# Patient Record
Sex: Male | Born: 1950
Health system: Southern US, Community
[De-identification: ages and names within clinical notes are randomized; demographics above are authoritative.]

## PROBLEM LIST (undated history)

## (undated) DIAGNOSIS — E669 Obesity, unspecified: Secondary | ICD-10-CM

## (undated) DIAGNOSIS — E1022 Type 1 diabetes mellitus with diabetic chronic kidney disease: Secondary | ICD-10-CM

## (undated) DIAGNOSIS — I498 Other specified cardiac arrhythmias: Secondary | ICD-10-CM

## (undated) DIAGNOSIS — I499 Cardiac arrhythmia, unspecified: Secondary | ICD-10-CM

## (undated) DIAGNOSIS — I214 Non-ST elevation (NSTEMI) myocardial infarction: Secondary | ICD-10-CM

## (undated) DIAGNOSIS — E119 Type 2 diabetes mellitus without complications: Secondary | ICD-10-CM

## (undated) DIAGNOSIS — E785 Hyperlipidemia, unspecified: Secondary | ICD-10-CM

## (undated) DIAGNOSIS — I10 Essential (primary) hypertension: Secondary | ICD-10-CM

## (undated) DIAGNOSIS — I5022 Chronic systolic (congestive) heart failure: Secondary | ICD-10-CM

## (undated) DIAGNOSIS — J449 Chronic obstructive pulmonary disease, unspecified: Secondary | ICD-10-CM

## (undated) DIAGNOSIS — Z95810 Presence of automatic (implantable) cardiac defibrillator: Secondary | ICD-10-CM

## (undated) DIAGNOSIS — I2510 Atherosclerotic heart disease of native coronary artery without angina pectoris: Secondary | ICD-10-CM

## (undated) DIAGNOSIS — I483 Typical atrial flutter: Secondary | ICD-10-CM

## (undated) DIAGNOSIS — I509 Heart failure, unspecified: Secondary | ICD-10-CM

## (undated) DIAGNOSIS — Z79899 Other long term (current) drug therapy: Secondary | ICD-10-CM

## (undated) DIAGNOSIS — I110 Hypertensive heart disease with heart failure: Secondary | ICD-10-CM

## (undated) DIAGNOSIS — N183 Chronic kidney disease, stage 3 (moderate): Secondary | ICD-10-CM

## (undated) DIAGNOSIS — I472 Ventricular tachycardia: Secondary | ICD-10-CM

## (undated) DIAGNOSIS — R0600 Dyspnea, unspecified: Secondary | ICD-10-CM

## (undated) HISTORY — DX: Chronic kidney disease, stage 3 (moderate): N18.3

## (undated) HISTORY — PX: EYE SURGERY: SHX253

## (undated) HISTORY — DX: Cardiac arrhythmia, unspecified: I49.9

## (undated) HISTORY — DX: Obesity, unspecified: E66.9

## (undated) HISTORY — DX: Essential (primary) hypertension: I10

## (undated) HISTORY — DX: Hypertensive heart disease with heart failure: I11.0

## (undated) HISTORY — PX: ICD IMPLANT: EP1208

## (undated) HISTORY — DX: Presence of automatic (implantable) cardiac defibrillator: Z95.810

## (undated) HISTORY — DX: Ventricular tachycardia: I47.2

## (undated) HISTORY — DX: Type 2 diabetes mellitus without complications: E11.9

## (undated) HISTORY — DX: Dyspnea, unspecified: R06.00

## (undated) HISTORY — DX: Typical atrial flutter: I48.3

## (undated) HISTORY — DX: Other specified cardiac arrhythmias: I49.8

## (undated) HISTORY — DX: Type 1 diabetes mellitus with diabetic chronic kidney disease: E10.22

## (undated) HISTORY — DX: Hyperlipidemia, unspecified: E78.5

## (undated) HISTORY — DX: Other long term (current) drug therapy: Z79.899

## (undated) HISTORY — DX: Heart failure, unspecified: I50.9

## (undated) HISTORY — DX: Non-ST elevation (NSTEMI) myocardial infarction: I21.4

## (undated) HISTORY — DX: Chronic systolic (congestive) heart failure: I50.22

## (undated) HISTORY — DX: Chronic obstructive pulmonary disease, unspecified: J44.9

## (undated) HISTORY — PX: MOHS SURGERY: SUR867

## (undated) HISTORY — PX: NEPHRECTOMY: SHX65

## (undated) HISTORY — DX: Atherosclerotic heart disease of native coronary artery without angina pectoris: I25.10

## (undated) NOTE — *Deleted (*Deleted)
Physical Medicine and Rehabilitation Admission H&P     HPI: James [name], [name]. is a 36 year old right-handed male with history of CKD with creatinine 1.47, CAD/non-STEMI/CABG /ICD 2015 maintained on amiodarone as well as Plavix followed by cardiology services Dr. Dulce Sellar in [facility], chronic systolic congestive heart failure maintained on Entresto, CKD stage III as well as right nephrectomy 2019, COPD, diabetes mellitus, hypertension, hyperlipidemia.  Per chart review patient lives with spouse.  1 level home one-step to entry.  Used a Rollator prior to admission.  Wife does assist with some ADLs.  Presented 02/10/2020 with progressive low back pain radiating to lower extremities right greater than left.  MRI demonstrated L3-4 and L4-5 spinal listhesis facet arthropathy spinal stenosis with radiculopathy.  Patient underwent bilateral L3-4 and 4-5 laminotomy foraminotomy to decompress bilateral L3-4-5 nerve roots with posterior lumbar interbody fusion as well as transforaminal lumbar interbody fusion 02/10/2020 per Dr. Lovell Sheehan.  Lumbar corset when out of bed.  Monitoring of renal function latest creatinine 1.62 from a baseline of 1.47-1.50.  Acute blood loss anemia 9.8 and monitored.  Plan to resume Plavix 5 days postop.  Therapy evaluations completed and patient was admitted for a comprehensive rehab program.  Review of Systems  Constitutional: Negative for chills and fever.  HENT: Negative for hearing loss.   Eyes: Negative for blurred vision and double vision.  Respiratory: Positive for shortness of breath. Negative for cough.   Cardiovascular: Positive for palpitations and leg swelling. Negative for chest pain.  Gastrointestinal: Positive for constipation. Negative for heartburn, nausea and vomiting.  Genitourinary: Positive for urgency. Negative for dysuria and hematuria.  Musculoskeletal: Positive for back pain, joint pain and myalgias.  Skin: Negative for rash.  All other  systems reviewed and are negative.  Past Medical History:  Diagnosis Date  . Abnormal nuclear stress test 11/03/2017   Added automatically from request for surgery [id number]  . Atrial arrhythmia 02/15/2014  . Benign hypertension with chronic kidney disease, stage III (HCC) 04/11/2018  . CAD in native artery 08/27/2014  . Cardiac dysrhythmia 02/12/2014  . Chronic systolic congestive heart failure (HCC) 10/26/2017  . CKD stage 3 due to type 1 diabetes mellitus (HCC) 08/28/2014   Overview:  Cr 1.3 at discharge 08/21/14  . COPD GOLD II 10/17/2014   Followed in Pulmonary clinic/ [facility]/ [facility]  - PFTs   10/17/2014 FEV1  2.09 ( 53%) ratio 62 no sign better p B2 and dlco 63% and corrects to 85%  - 10/17/2014 p extensive coaching HFA effectiveness =  90%    > try stiolto    . Coronary artery disease involving native coronary artery of native heart with angina pectoris (HCC) 08/27/2014  . DM (diabetes mellitus) (HCC)   . Dyspnea 09/05/2014   Followed in Pulmonary clinic/ [facility]/ [facility] - 09/05/2014  Walked RA x 3 laps @ 185 ft each stopped due to end of study, nl pace, no desat   - pfts 10/17/14 dlco 63% on Amiodarone > may benefit from 6 month f/u but defer to Cards    . Essential hypertension 09/11/2014   Changed acei to ARB  09/05/14 due to pseudowheeze> improved 10/17/14    . Heart failure (HCC)   . Hyperlipidemia   . Hypertension   . Hypertensive heart disease with congestive heart failure (HCC) 08/27/2014  . ICD (implantable cardioverter-defibrillator) in place 08/27/2014  . Metabolic bone disease 01/17/2018  . NSTEMI (non-ST elevated myocardial infarction) (HCC) 02/12/2014  . Obesity 10/18/2014  Complicated by dm/ hbp and low erv on pfts 10/17/14    . On amiodarone therapy 08/27/2014  . Renal oncocytoma of right kidney 01/29/2018  . S/P ICD (internal cardiac defibrillator) procedure 02/15/2014  . Systolic CHF, chronic (HCC) 08/27/2014   Overview:  BNP 1350 at discharge K 3.9 on 08/21/14 Echo  EF 35% May 2016  . Typical atrial flutter (HCC) 08/27/2014  . Ventricular tachyarrhythmia (HCC) 08/27/2014  . Vitamin D deficiency 01/17/2018   Past Surgical History:  Procedure Laterality Date  . CORONARY ARTERY BYPASS GRAFT  2008  . EYE SURGERY    . ICD IMPLANT    . MOHS SURGERY     Right Arm  . NEPHRECTOMY Right    Family History  Problem Relation Age of Onset  . Heart disease Mother   . Breast cancer Mother   . Heart disease Father   . Cancer Daughter   . COPD Brother    Social History:  reports that he quit smoking about 13 years ago. His smoking use included cigarettes. He has a 38.00 pack-year smoking history. He has never used smokeless tobacco. He reports that he does not drink alcohol and does not use drugs. Allergies:  Allergies  Allergen Reactions  . Baclofen Other (See Comments)    Mental status changes  . Codeine Rash   Medications Prior to Admission  Medication Sig Dispense Refill  . acetaminophen (TYLENOL) 500 MG tablet Take 500 mg by mouth 2 (two) times daily.     Marland Kitchen amiodarone (PACERONE) 200 MG tablet Take 200 mg by mouth 2 (two) times daily.    . Ascorbic Acid (VITAMIN C) 1000 MG tablet Take 1,000 mg by mouth 2 (two) times daily.    Marland Kitchen atorvastatin (LIPITOR) 40 MG tablet TAKE 1 TABLET BY MOUTH ONCE DAILY. *NEEDS APPOINTMENT FOR REFILLS* (Patient taking differently: Take 40 mg by mouth daily. ) 90 tablet 2  . b complex vitamins capsule Take 1 capsule by mouth daily.    . clopidogrel (PLAVIX) 75 MG tablet Take 1 tablet (75 mg total) by mouth daily. 90 tablet 1  . Coenzyme Q10 (COQ10) 100 MG CAPS Take 100 mg by mouth daily.    . ergocalciferol (VITAMIN D2) 1.25 MG (50000 UT) capsule Take 50,000 Units by mouth every Monday.     . furosemide (LASIX) 40 MG tablet TAKE 1 TABLET BY MOUTH TWICE(2) DAILY (Patient taking differently: Take 40 mg by mouth 2 (two) times daily. ) 180 tablet 1  . insulin detemir (LEVEMIR) 100 UNIT/ML injection Inject 10 Units into the skin  daily.     . isosorbide mononitrate (IMDUR) 30 MG 24 hr tablet Take 1 tablet by mouth every morning. (Hold if blood pressure under 100 sbp)    . levothyroxine (SYNTHROID, LEVOTHROID) 88 MCG tablet Take 88 mcg by mouth daily before breakfast.     . LORazepam (ATIVAN) 0.5 MG tablet Take 0.5 mg by mouth See admin instructions. Take 0.5 mg at bedtime may take a second 0.5 mg during the day as needed for anxiety    . metoprolol succinate (TOPROL XL) 25 MG 24 hr tablet Take 1 tablet (25 mg total) by mouth daily. 90 tablet 3  . Multiple Vitamin (MULTIVITAMIN) capsule Take 1 capsule by mouth daily.    Marland Kitchen NYAMYC powder Apply 1 application topically daily as needed for irritation.    Marland Kitchen omega-3 acid ethyl esters (LOVAZA) 1 g capsule Take 2 capsules (2 g total) by mouth in the morning and at  bedtime. (Patient taking differently: Take 1 g by mouth in the morning and at bedtime. ) 120 capsule 3  . sacubitril-valsartan (ENTRESTO) 49-51 MG Take 1 tablet by mouth 2 (two) times daily. 60 tablet 3  . senna (SENOKOT) 8.6 MG tablet Take 2 tablets by mouth daily.     . sitaGLIPtin (JANUVIA) 25 MG tablet Take 25 mg by mouth daily.     . tamsulosin (FLOMAX) 0.4 MG CAPS capsule Take 0.4 mg by mouth daily.     . Zinc 50 MG TABS Take 50 mg by mouth daily.     . magnesium hydroxide (MILK OF MAGNESIA) 400 MG/5ML suspension Take 15 mLs by mouth daily as needed for mild constipation.    . melatonin 3 MG TABS tablet Take 6 mg by mouth at bedtime.     . nitroGLYCERIN (NITROSTAT) 0.4 MG SL tablet Place 1 tablet (0.4 mg total) under the tongue every 5 (five) minutes as needed for chest pain. 25 tablet 5    Drug Regimen Review Drug regimen was reviewed and remains appropriate with no significant issues identified  Home: Home Living Family/patient expects to be discharged to:: Private residence Living Arrangements: Spouse/significant other Available Help at Discharge: Family, Available 24 hours/day Type of Home: House Home  Access: Stairs to enter Secretary/administrator of Steps: 1 Entrance Stairs-Rails: None Home Layout: One level, Laundry or work area in basement [employer] Shower/Tub: Engineer, manufacturing systems: Standard (3 in 1 over toilet) Home Equipment: Environmental consultant - 4 wheels, Toilet riser, Environmental consultant - 2 wheels   Functional History: Prior Function Level of Independence: Needs assistance Gait / Transfers Assistance Needed: use of rollator, wife states needs assistance at times to get out of chair without arms ADL's / Homemaking Assistance Needed: wife has been assisting with bathing, dressing, tub transfers due to increased back pain Comments: patient has been declining recently due to pain, unable to stand upright prior to surgery.   Functional Status:  Mobility: Bed Mobility Overal bed mobility: Needs Assistance Bed Mobility: Sit to Sidelying, Rolling Rolling: Min guard Supine to sit: Max assist, +2 for physical assistance, +2 for safety/equipment, HOB elevated Sit to supine: Max assist Sit to sidelying: Mod assist, +2 for physical assistance General bed mobility comments: Cues for log roll technique. Assist required for LEs and trunk descent.  Transfers Overall transfer level: Needs assistance Equipment used: Rolling walker (2 wheeled) Transfers: Sit to/from Stand, [employer] Transfers Sit to Stand: Mod assist, +2 physical assistance Stand pivot transfers: +2 safety/equipment, Min assist General transfer comment: Mod A +2 to stand from lower surface. Pt with improved steadiness, only requiring min A +2 for steadying to transfer back to bed. Required multimodal cues for sequencing.  Ambulation/Gait Ambulation/Gait assistance: Mod assist Gait Distance (Feet): 1 Feet Assistive device: Rolling walker (2 wheeled) Gait Pattern/deviations: Step-to pattern General Gait Details: 2 small steps fwd, patient unable to take steps backwards and began to sit on bed with uncontrolled descent    ADL: ADL  Overall ADL's : Needs assistance/impaired Eating/Feeding: Set up, Sitting Grooming: Set up, Sitting Upper Body Bathing: Minimal assistance, Sitting Lower Body Bathing: Sit to/from stand, Maximal assistance Upper Body Dressing : Minimal assistance, Sitting Lower Body Dressing: Total assistance, Sit to/from stand Lower Body Dressing Details (indicate cue type and reason): Total A to don socks. With increased time/effort, able to cross R LE but difficulty in doing so Toilet Transfer: Maximal assistance, +2 for physical assistance, +2 for safety/equipment, Stand-pivot, RW Toilet Transfer Details (indicate cue  type and reason): simaulted to recliner Toileting- Clothing Manipulation and Hygiene: Total assistance, Sit to/from stand General ADL Comments: Limited by decreased B LE strength, poor standing balance and back precautions but improved pain in comparison to pre-surgery   Cognition: Cognition Overall Cognitive Status: Impaired/Different from baseline Orientation Level: Oriented X4 Cognition Arousal/Alertness: Awake/alert Behavior During Therapy: Flat affect Overall Cognitive Status: Impaired/Different from baseline Area of Impairment: Safety/judgement, Problem solving, Awareness Memory: Decreased short-term memory Safety/Judgement: Decreased awareness of safety, Decreased awareness of deficits Awareness: Emergent Problem Solving: Slow processing, Decreased initiation, Difficulty sequencing, Requires verbal cues, Requires tactile cues General Comments: Pt with increased alertness, though still lethargic/flat appearing. Pt able to recall BLT precautions, requires repetition of sequencing and safety cues for effective follow-through  Physical Exam: Blood pressure 128/62, pulse 66, temperature 98.7 F (37.1 C), temperature source Oral, resp. rate 18, SpO2 95 %. Physical Exam Skin:    Comments: Back incision is dressed  Neurological:     Comments: Patient is alert in no acute distress.   Oriented x3 and follows commands.  Fair medical historian     Results for orders placed or performed during the hospital encounter of 02/10/20 (from the past 48 hour(s))  ABO/Rh     Status: None   Collection Time: 02/10/20  7:12 AM  Result Value Ref Range   ABO/RH(D)      A POS Performed at [facility], [address], [place], [place] [address]   Glucose, capillary     Status: Abnormal   Collection Time: 02/10/20  1:12 PM  Result Value Ref Range   Glucose-Capillary 143 (H) 70 - 99 mg/dL    Comment: Glucose reference range applies only to samples taken after fasting for at least 8 hours.   Comment 1 Notify RN    Comment 2 Document in Chart   Glucose, capillary     Status: Abnormal   Collection Time: 02/10/20  5:32 PM  Result Value Ref Range   Glucose-Capillary 201 (H) 70 - 99 mg/dL    Comment: Glucose reference range applies only to samples taken after fasting for at least 8 hours.   Comment 1 Notify RN    Comment 2 Document in Chart   Glucose, capillary     Status: Abnormal   Collection Time: 02/10/20  9:27 PM  Result Value Ref Range   Glucose-Capillary 203 (H) 70 - 99 mg/dL    Comment: Glucose reference range applies only to samples taken after fasting for at least 8 hours.   Comment 1 Notify RN    Comment 2 Document in Chart   CBC     Status: Abnormal   Collection Time: 02/11/20  1:35 AM  Result Value Ref Range   WBC 11.1 (H) 4.0 - 10.5 K/uL   RBC 3.85 (L) 4.22 - 5.81 MIL/uL   Hemoglobin 11.7 (L) 13.0 - 17.0 g/dL   HCT 88.4 (L) 39 - 52 %   MCV 93.5 80.0 - 100.0 fL   MCH 30.4 26.0 - 34.0 pg   MCHC 32.5 30.0 - 36.0 g/dL   RDW 16.6 06.3 - 01.6 %   Platelets 192 150 - 400 K/uL   nRBC 0.0 0.0 - 0.2 %    Comment: Performed at [facility], [address] [place], [place] [address]  Basic Metabolic Panel     Status: Abnormal   Collection Time: 02/11/20  1:35 AM  Result Value Ref Range   Sodium 140 135 - 145 mmol/L  Potassium 3.9 3.5 - 5.1 mmol/L    Chloride 104 98 - 111 mmol/L   CO2 25 22 - 32 mmol/L   Glucose, Bld 162 (H) 70 - 99 mg/dL    Comment: Glucose reference range applies only to samples taken after fasting for at least 8 hours.   BUN 32 (H) 8 - 23 mg/dL   Creatinine, Ser 4.69 (H) 0.61 - 1.24 mg/dL   Calcium 8.9 8.9 - 62.9 mg/dL   GFR, Estimated 55 (L) >60 mL/min    Comment: (NOTE) Calculated using the CKD-EPI Creatinine Equation (2021)    Anion gap 11 5 - 15    Comment: Performed at [facility], [address] [place], [place] [address]  Glucose, capillary     Status: Abnormal   Collection Time: 02/11/20  8:06 AM  Result Value Ref Range   Glucose-Capillary 152 (H) 70 - 99 mg/dL    Comment: Glucose reference range applies only to samples taken after fasting for at least 8 hours.  Glucose, capillary     Status: Abnormal   Collection Time: 02/11/20 12:11 PM  Result Value Ref Range   Glucose-Capillary 116 (H) 70 - 99 mg/dL    Comment: Glucose reference range applies only to samples taken after fasting for at least 8 hours.  Glucose, capillary     Status: Abnormal   Collection Time: 02/11/20  4:21 PM  Result Value Ref Range   Glucose-Capillary 125 (H) 70 - 99 mg/dL    Comment: Glucose reference range applies only to samples taken after fasting for at least 8 hours.  Glucose, capillary     Status: Abnormal   Collection Time: 02/11/20  8:06 PM  Result Value Ref Range   Glucose-Capillary 105 (H) 70 - 99 mg/dL    Comment: Glucose reference range applies only to samples taken after fasting for at least 8 hours.  Glucose, capillary     Status: Abnormal   Collection Time: 02/12/20  6:45 AM  Result Value Ref Range   Glucose-Capillary 114 (H) 70 - 99 mg/dL    Comment: Glucose reference range applies only to samples taken after fasting for at least 8 hours.   DG Lumbar Spine 2-3 Views  Result Date: 02/10/2020 CLINICAL DATA:  Posterior fusion EXAM: LUMBAR SPINE - 2-3 VIEW; DG C-ARM 1-60 MIN COMPARISON:  January 07, 2020  FLUOROSCOPY TIME:  0 minutes 22 seconds; 19.85 mGy; 2 acquired images FINDINGS: Frontal and lateral views were obtained. There are pedicle screws at L3, L4, and L5 bilaterally with screw tips in respective vertebral bodies. Disc spacers noted at L3-4 and L4-5. No fracture evident. Mild spondylolisthesis at L4-5 appear stable. No new spondylolisthesis. IMPRESSION: Postoperative pedicle screw placement at L3, L4, and L5 as well as disc spacers at L3-4 and L4-5. Pedicle screws in respective vertebral bodies. Stable mild spondylolisthesis at L4-5 with screw placement in this area. No fracture evident. Electronically Signed   By: Bretta Bang III M.D.   On: 02/10/2020 12:33   DG Lumbar Spine 1 View  Result Date: 02/10/2020 CLINICAL DATA:  Intraoperative localization image EXAM: LUMBAR SPINE - 1 VIEW COMPARISON:  01/07/2020, 12/13/2019 FINDINGS: Lateral radiograph of the lumbar spine was obtained with surgical retractors and instruments at the L3-4 interspace. Numbering nomenclature is similar to that utilized on prior MRI examination. IMPRESSION: Intraoperative localization at L3-4. Electronically Signed   By: Alcide Clever M.D.   On: 02/10/2020 14:10   DG C-Arm 1-60 Min  Result Date: 02/10/2020  CLINICAL DATA:  Posterior fusion EXAM: LUMBAR SPINE - 2-3 VIEW; DG C-ARM 1-60 MIN COMPARISON:  January 07, 2020 FLUOROSCOPY TIME:  0 minutes 22 seconds; 19.85 mGy; 2 acquired images FINDINGS: Frontal and lateral views were obtained. There are pedicle screws at L3, L4, and L5 bilaterally with screw tips in respective vertebral bodies. Disc spacers noted at L3-4 and L4-5. No fracture evident. Mild spondylolisthesis at L4-5 appear stable. No new spondylolisthesis. IMPRESSION: Postoperative pedicle screw placement at L3, L4, and L5 as well as disc spacers at L3-4 and L4-5. Pedicle screws in respective vertebral bodies. Stable mild spondylolisthesis at L4-5 with screw placement in this area. No fracture evident.  Electronically Signed   By: Bretta Bang III M.D.   On: 02/10/2020 12:33       Medical Problem List and Plan: 1.  Decreased functional mobility secondary to L3-4 4-5 spondylolisthesis arthropathy with radiculopathy/spinal stenosis.  Status post bilateral L3-4 4-5 laminotomy foraminotomies decompression interbody fusion 02/10/2020.  Back corset when out of bed  -patient may *** shower  -ELOS/Goals: *** 2.  Antithrombotics: -DVT/anticoagulation: SCDs.  Check vascular study  -antiplatelet therapy: Resume Plavix 75 mg daily postop day #5 3. Pain Management: Flexeril as needed, Dilaudid 2-4 mg every 4 hours as needed pain 4. Mood: Provide emotional support  -antipsychotic agents: N/A 5. Neuropsych: This patient is capable of making decisions on his own behalf. 6. Skin/Wound Care: Routine skin checks 7. Fluids/Electrolytes/Nutrition: Routine in and outs with with follow-up chemistries 8.  Acute on chronic anemia.  Follow-up CBC 9.  CAD with CABG/ICD.  Amiodarone 200 mg twice daily, Imdur 30 mg daily, Toprol 25 mg daily 10  COPD.  Check oxygen saturations every shift 11.  CKD stage III with history of right nephrectomy.  Baseline creatinine 1.47-1.50 12.  Diabetes mellitus.  Hemoglobin A1c 5.9.  Currently on Tradjenta 5 mg daily.  Patient on Levemir 10 units daily prior to admission.  Resume as needed 13.  Chronic systolic congestive heart failure.  Entresto 49-51 mg twice daily, Lasix 40 mg twice daily.  Monitor for any signs of fluid overload. 14.  Hypertension.  Monitor with increased mobility 15.  Hyperlipidemia.  Lipitor 16.  Hypothyroidism.  Synthroid 17.  BPH.  Flomax 0.4 mg daily.  Check PVR  ***  Charlton Amor, PA-C 02/12/2020

---

## 2006-04-04 HISTORY — PX: CORONARY ARTERY BYPASS GRAFT: SHX141

## 2007-03-14 ENCOUNTER — Ambulatory Visit: Payer: Self-pay | Admitting: Thoracic Surgery (Cardiothoracic Vascular Surgery)

## 2014-02-12 DIAGNOSIS — I214 Non-ST elevation (NSTEMI) myocardial infarction: Secondary | ICD-10-CM

## 2014-02-12 DIAGNOSIS — I499 Cardiac arrhythmia, unspecified: Secondary | ICD-10-CM | POA: Insufficient documentation

## 2014-02-12 HISTORY — DX: Cardiac arrhythmia, unspecified: I49.9

## 2014-02-12 HISTORY — DX: Non-ST elevation (NSTEMI) myocardial infarction: I21.4

## 2014-02-15 DIAGNOSIS — I498 Other specified cardiac arrhythmias: Secondary | ICD-10-CM

## 2014-02-15 DIAGNOSIS — Z95810 Presence of automatic (implantable) cardiac defibrillator: Secondary | ICD-10-CM

## 2014-02-15 HISTORY — DX: Presence of automatic (implantable) cardiac defibrillator: Z95.810

## 2014-02-15 HISTORY — DX: Other specified cardiac arrhythmias: I49.8

## 2014-08-27 DIAGNOSIS — Z79899 Other long term (current) drug therapy: Secondary | ICD-10-CM

## 2014-08-27 DIAGNOSIS — E785 Hyperlipidemia, unspecified: Secondary | ICD-10-CM | POA: Insufficient documentation

## 2014-08-27 DIAGNOSIS — I483 Typical atrial flutter: Secondary | ICD-10-CM | POA: Insufficient documentation

## 2014-08-27 DIAGNOSIS — I25119 Atherosclerotic heart disease of native coronary artery with unspecified angina pectoris: Secondary | ICD-10-CM | POA: Insufficient documentation

## 2014-08-27 DIAGNOSIS — I110 Hypertensive heart disease with heart failure: Secondary | ICD-10-CM | POA: Insufficient documentation

## 2014-08-27 DIAGNOSIS — I472 Ventricular tachycardia, unspecified: Secondary | ICD-10-CM

## 2014-08-27 DIAGNOSIS — I5022 Chronic systolic (congestive) heart failure: Secondary | ICD-10-CM | POA: Insufficient documentation

## 2014-08-27 DIAGNOSIS — I2510 Atherosclerotic heart disease of native coronary artery without angina pectoris: Secondary | ICD-10-CM

## 2014-08-27 DIAGNOSIS — Z95810 Presence of automatic (implantable) cardiac defibrillator: Secondary | ICD-10-CM

## 2014-08-27 HISTORY — DX: Ventricular tachycardia: I47.2

## 2014-08-27 HISTORY — DX: Presence of automatic (implantable) cardiac defibrillator: Z95.810

## 2014-08-27 HISTORY — DX: Hypertensive heart disease with heart failure: I11.0

## 2014-08-27 HISTORY — DX: Ventricular tachycardia, unspecified: I47.20

## 2014-08-27 HISTORY — DX: Chronic systolic (congestive) heart failure: I50.22

## 2014-08-27 HISTORY — DX: Typical atrial flutter: I48.3

## 2014-08-27 HISTORY — DX: Atherosclerotic heart disease of native coronary artery without angina pectoris: I25.10

## 2014-08-27 HISTORY — DX: Other long term (current) drug therapy: Z79.899

## 2014-08-27 HISTORY — DX: Atherosclerotic heart disease of native coronary artery with unspecified angina pectoris: I25.119

## 2014-08-28 DIAGNOSIS — E1022 Type 1 diabetes mellitus with diabetic chronic kidney disease: Secondary | ICD-10-CM

## 2014-08-28 DIAGNOSIS — N183 Chronic kidney disease, stage 3 unspecified: Secondary | ICD-10-CM | POA: Insufficient documentation

## 2014-08-28 HISTORY — DX: Chronic kidney disease, stage 3 unspecified: N18.30

## 2014-08-28 HISTORY — DX: Type 1 diabetes mellitus with diabetic chronic kidney disease: E10.22

## 2014-09-05 ENCOUNTER — Ambulatory Visit (INDEPENDENT_AMBULATORY_CARE_PROVIDER_SITE_OTHER): Payer: BLUE CROSS/BLUE SHIELD | Admitting: Internal Medicine

## 2014-09-05 ENCOUNTER — Ambulatory Visit (INDEPENDENT_AMBULATORY_CARE_PROVIDER_SITE_OTHER)
Admission: RE | Admit: 2014-09-05 | Discharge: 2014-09-05 | Disposition: A | Discharge status: Home / Self Care | Payer: BLUE CROSS/BLUE SHIELD | Source: Ambulatory Visit | Attending: Internal Medicine | Admitting: Internal Medicine

## 2014-09-05 ENCOUNTER — Encounter: Payer: Self-pay | Admitting: Internal Medicine

## 2014-09-05 ENCOUNTER — Encounter (INDEPENDENT_AMBULATORY_CARE_PROVIDER_SITE_OTHER): Payer: Self-pay

## 2014-09-05 VITALS — BP 122/66 | HR 73 | Wt 256.0 lb

## 2014-09-05 DIAGNOSIS — R0600 Dyspnea, unspecified: Secondary | ICD-10-CM

## 2014-09-05 DIAGNOSIS — I10 Essential (primary) hypertension: Secondary | ICD-10-CM | POA: Diagnosis not present

## 2014-09-05 HISTORY — DX: Dyspnea, unspecified: R06.00

## 2014-09-05 MED ORDER — VALSARTAN 80 MG PO TABS: 80.0000 mg | ORAL_TABLET | Freq: Every day | ORAL | Status: DC

## 2014-09-05 NOTE — Progress Notes (Signed)
Subjective:    Patient ID: James [name], male    DOB: 08/01/1950,    MRN: [id number]  HPI  83 yowm quit smoking 2008 with no respiratory problems other than ? osa at wt 240 and doing fine [facility] [employer]  Then acutely ill 08/14/14 head cold (? From wife) then sore throat then sob 5/14 night > UC  5/15 > admitted thru May 19th with dx of CAP with  resp failure/ hypercarbia sent home on 02  And referred to pulmonary clinic 09/05/14 by Dr Laqueta Due.   09/05/2014 1st [facility] office visit/ Junnie Loschiavo   Chief Complaint  Patient presents with  . Pulmonary Consult    Referred by Dr. Jillyn Ledger. Pt states that he had PNA mid May 2016 and had to be admitted to [facility]. Breathing is now back to his normal baseline.    breathing 80% better at discharge and even  some better since then, now walking treadmill x 15 min but slower pace same grade Was on bipap at [place] x 3 nights, felt better but d/c off it and just on 02 2lpm bedtime and prn daytime at first ov  Denies excess daytime somnolence/ on ACEi and tapering prednisone but no inhalers.  No obvious  day to day or daytime variabilty or assoc chronic cough or cp or chest tightness, subjective wheeze overt sinus or hb symptoms. No unusual exp hx or h/o childhood pna/ asthma or knowledge of premature birth.  Sleeping ok without nocturnal  or early am exacerbation  of respiratory  c/o's or need for noct saba. Also denies any obvious fluctuation of symptoms with weather or environmental changes or other aggravating or alleviating factors except as outlined above   Current Medications, Allergies, Complete Past Medical History, Past Surgical History, Family History, and Social History were reviewed in [employer] record.                Review of Systems  Constitutional: Negative for fever, chills, activity change, appetite change and unexpected weight change.  HENT: Negative for  congestion, dental problem, postnasal drip, rhinorrhea, sneezing, sore throat, trouble swallowing and voice change.   Eyes: Negative for visual disturbance.  Respiratory: Negative for cough, choking and shortness of breath.   Cardiovascular: Negative for chest pain and leg swelling.  Gastrointestinal: Negative for nausea, vomiting and abdominal pain.  Genitourinary: Negative for difficulty urinating.  Musculoskeletal: Negative for arthralgias.  Skin: Negative for rash.  Psychiatric/Behavioral: Negative for behavioral problems and confusion.       Objective:   Physical Exam  amb wm marked central obesity and mild pseudowheeze   Wt Readings from Last 3 Encounters:  No data found for Wt    Vital signs reviewed    HEENT: nl dentition, turbinates, and orophanx. Nl external ear canals without cough reflex   NECK :  without JVD/Nodes/TM/ nl carotid upstrokes bilaterally   LUNGS: no acc muscle use, clear to A and P bilaterally without cough on insp or exp maneuvers   CV:  RRR  no s3 or murmur or increase in P2, no edema   ABD:  Massively obese but soft and nontender with nl excursion in the supine position. No bruits or organomegaly, bowel sounds nl  MS:  warm without deformities, calf tenderness, cyanosis or clubbing  SKIN: warm and dry without lesions    NEURO:  alert, approp, no deficits   CXR PA and Lateral:   09/05/2014 :  I personally reviewed images and agree with radiology impression as follows:   No residual infiltrate. Minimal posterior atelectasis.            Assessment & Plan:

## 2014-09-05 NOTE — Patient Instructions (Addendum)
Diovan 80 mg one daily instead of lisinopril  Just use the duoneb as needed up to every 6 hours  Wear 02 at 2lpm at bedtime and as needed to keep sats above 90% daytime   Ok to return to work  GERD (REFLUX)  is an extremely common cause of respiratory symptoms just like yours , many times with no obvious heartburn at all.    It can be treated with medication, but also with lifestyle changes including avoidance of late meals, elevation of the head of your bed (ideally with 6 inch  bed blocks) excessive alcohol, smoking cessation, and avoid fatty foods, chocolate, peppermint, colas, red wine, and acidic juices such as orange juice.  NO MINT OR MENTHOL PRODUCTS SO NO COUGH DROPS  USE SUGARLESS CANDY INSTEAD (Jolley ranchers or Stover's or Life Savers) or even ice chips will also do - the key is to swallow to prevent all throat clearing. NO OIL BASED VITAMINS - use powdered substitutes.  Please remember to go to the   x-ray department downstairs for your tests - we will call you with the results when they are available.  Please schedule a follow up office visit in 6 weeks, call sooner if needed with pfts

## 2014-09-06 NOTE — Assessment & Plan Note (Addendum)
-   09/05/2014  Walked RA x 3 laps @ 185 ft each stopped due to end of study, nl pace, no desat      When respiratory symptoms begin or become refractory well after a patient reports complete smoking cessation,  Especially when this wasn't the case while they were smoking, a red flag is raised based on the work of Dr Kris Mouton which states:  if you quit smoking when your best day FEV1 is still well preserved it is highly unlikely you will progress to severe disease.  That is to say, once the smoking stops,  the symptoms should not suddenly erupt or markedly worsen.  If so, the differential diagnosis should include  obesity/deconditioning,  LPR/Reflux/Aspiration syndromes,  occult CHF, or  especially side effect of medications commonly used in this population, especially ACEi (note amiodarone and actos both a concern but no  apparent effects at present)  ACEi top of usual list of suspects > try off and return for pfts to sort out how much copd vs obesity at play heer

## 2014-09-09 ENCOUNTER — Encounter: Payer: Self-pay | Admitting: Internal Medicine

## 2014-09-09 NOTE — Progress Notes (Signed)
Quick Note:  LMTCB ______ 

## 2014-09-10 ENCOUNTER — Telehealth: Payer: Self-pay | Admitting: Internal Medicine

## 2014-09-10 NOTE — Telephone Encounter (Signed)
Patient notified of lab results. Nothing further needed.  

## 2014-09-11 DIAGNOSIS — I110 Hypertensive heart disease with heart failure: Secondary | ICD-10-CM | POA: Insufficient documentation

## 2014-09-11 DIAGNOSIS — I10 Essential (primary) hypertension: Secondary | ICD-10-CM

## 2014-09-11 HISTORY — DX: Essential (primary) hypertension: I10

## 2014-09-11 NOTE — Assessment & Plan Note (Signed)
ACE inhibitors are problematic in  pts with airway complaints because  even experienced pulmonologists can't always distinguish ace effects from copd/asthma/pnds/ allergies etc.  By themselves they don't actually cause a problem, much like oxygen can't by itself start a fire, but they certainly serve as a powerful catalyst or enhancer for any "fire"  or inflammatory process in the upper airway, be it caused by an ET  tube or more commonly reflux (especially in the obese or pts with known GERD or who are on biphoshonates) or URI's, due to interference with bradykinin clearance.  The effects of acei on bradykinin levels occurs in 100% of pt's on acei (unless they surreptitiously stop the med!) but the classic cough is only reported in 5%.  This leaves 95% of pts on acei's  with a variety of syndromes including no identifiable symptom in most  vs non-specific symptoms that wax and wane depending on what other insult is occuring at the level of the upper airway, whether that be gerd or viral uri here.   Only way to know is trial off then regroup in 6 weeks  Try diovan 160 mg daily until then

## 2014-10-17 ENCOUNTER — Encounter: Payer: Self-pay | Admitting: Internal Medicine

## 2014-10-17 ENCOUNTER — Ambulatory Visit (INDEPENDENT_AMBULATORY_CARE_PROVIDER_SITE_OTHER): Payer: BLUE CROSS/BLUE SHIELD | Admitting: Internal Medicine

## 2014-10-17 VITALS — BP 120/64 | HR 70 | Ht 73.0 in | Wt 261.0 lb

## 2014-10-17 DIAGNOSIS — I10 Essential (primary) hypertension: Secondary | ICD-10-CM

## 2014-10-17 DIAGNOSIS — R0600 Dyspnea, unspecified: Secondary | ICD-10-CM

## 2014-10-17 DIAGNOSIS — J449 Chronic obstructive pulmonary disease, unspecified: Secondary | ICD-10-CM

## 2014-10-17 DIAGNOSIS — E669 Obesity, unspecified: Secondary | ICD-10-CM

## 2014-10-17 HISTORY — DX: Chronic obstructive pulmonary disease, unspecified: J44.9

## 2014-10-17 LAB — PULMONARY FUNCTION TEST
DL/VA % pred: 85 %
DL/VA: 4.07 ml/min/mmHg/L
DLCO unc % pred: 63 %
DLCO unc: 23.18 ml/min/mmHg
FEF 25-75 Post: 1.59 L/sec
FEF 25-75 Pre: 1.04 L/sec
FEF2575-%Change-Post: 52 %
FEF2575-%Pred-Post: 51 %
FEF2575-%Pred-Pre: 33 %
FEV1-%Change-Post: 8 %
FEV1-%Pred-Post: 53 %
FEV1-%Pred-Pre: 49 %
FEV1-Post: 2.09 L
FEV1-Pre: 1.93 L
FEV1FVC-%Change-Post: -2 %
FEV1FVC-%Pred-Pre: 83 %
FEV6-%Change-Post: 12 %
FEV6-%Pred-Post: 67 %
FEV6-%Pred-Pre: 60 %
FEV6-Post: 3.35 L
FEV6-Pre: 2.99 L
FEV6FVC-%Change-Post: 0 %
FEV6FVC-%Pred-Post: 103 %
FEV6FVC-%Pred-Pre: 103 %
FVC-%Change-Post: 10 %
FVC-%Pred-Post: 65 %
FVC-%Pred-Pre: 59 %
FVC-Post: 3.4 L
FVC-Pre: 3.06 L
Post FEV1/FVC ratio: 62 %
Post FEV6/FVC ratio: 99 %
Pre FEV1/FVC ratio: 63 %
Pre FEV6/FVC Ratio: 98 %
RV % pred: 145 %
RV: 3.6 L
TLC % pred: 86 %
TLC: 6.55 L

## 2014-10-17 MED ORDER — TIOTROPIUM BROMIDE-OLODATEROL 2.5-2.5 MCG/ACT IN AERS: 2.0000 | INHALATION_SPRAY | Freq: Every day | RESPIRATORY_TRACT | Status: DC

## 2014-10-17 NOTE — Progress Notes (Signed)
PFT performed today. 

## 2014-10-17 NOTE — Progress Notes (Signed)
Subjective:    Patient ID: James [name], male    DOB: [birth year],    MRN: [id number]    Brief patient profile:  52 yowm quit smoking 2008 with no respiratory problems other than ? osa at wt 240 and doing fine [facility] [employer]  Then acutely ill 08/14/14 head cold (? From wife) then sore throat then sob 5/14 night > UC  5/15 > admitted thru May 19th with dx of CAP with  resp failure/ hypercarbia sent home on 02  And referred to [name] 09/05/14 by Dr Laqueta [name].    History of Present Illness  09/05/2014 1st James [name] office visit/ James [name]   Chief Complaint  Patient presents with  . [name] Consult    Referred by Dr. Jillyn [name]. Pt states that he had PNA mid May 2016 and had to be admitted to [facility]. Breathing is now back to his normal baseline.   breathing 80% better at discharge and even  some better since then, now walking treadmill x 15 min but slower pace but grade same Was on bipap at [place] x 3 nights, felt better but d/c off it and just on 02 2lpm bedtime and prn daytime at first ov  Denies excess daytime somnolence/ on ACEi and tapering prednisone but no inhalers. rec Just use the duoneb as needed up to every 6 hours Wear 02 at 2lpm at bedtime and as needed to keep sats above 90% daytime  Ok to return to work GERD (REFLUX)  Diet    10/17/2014 f/u ov/James [name] re: GOLD II copd / no maint rx / using 02 hs only  Chief Complaint  Patient presents with  . Follow-up    PFT done today. Pt c/o increased SOB for the past few wks- not able to walk as long on the treadmill.        Needed lots of duoneb to get thru a cold while on high dose coreg but stopped using the duoneb 2 d prior to OV now back doing treadmill but not happy with ex tol vs prior ov  No obvious day to day or daytime variability or assoc chronic cough or cp or chest tightness, subjective wheeze or overt sinus or hb symptoms. No unusual exp hx or h/o childhood pna/ asthma or  knowledge of premature birth.  Sleeping ok without nocturnal  or early am exacerbation  of respiratory  c/o's or need for noct saba. Also denies any obvious fluctuation of symptoms with weather or environmental changes or other aggravating or alleviating factors except as outlined above   Current Medications, Allergies, Complete Past Medical History, Past Surgical History, Family History, and Social History were reviewed in [employer] record.  ROS  The following are not active complaints unless bolded sore throat, dysphagia, dental problems, itching, sneezing,  nasal congestion or excess/ purulent secretions, ear ache,   fever, chills, sweats, unintended wt loss, classically pleuritic or exertional cp, hemoptysis,  orthopnea pnd or leg swelling, presyncope, palpitations, abdominal pain, anorexia, nausea, vomiting, diarrhea  or change in bowel or bladder habits, change in stools or urine, dysuria,hematuria,  rash, arthralgias, visual complaints, headache, numbness, weakness or ataxia or problems with walking or coordination,  change in mood/affect or memory.           Objective:   Physical Exam  amb wm marked central obesity and mild pseudowheeze   Wt Readings from Last 3 Encounters:  10/17/14 261 lb (118.389 kg)  09/09/14  256 lb (116.121 kg)    Vital signs reviewed   HEENT: nl dentition, turbinates, and orophanx. Nl external ear canals without cough reflex   NECK :  without JVD/Nodes/TM/ nl carotid upstrokes bilaterally   LUNGS: no acc muscle use, clear to A and P bilaterally without cough on insp or exp maneuvers   CV:  RRR  no s3 or murmur or increase in P2, no edema   ABD:  Massively obese but soft and nontender with nl excursion in the supine position. No bruits or organomegaly, bowel sounds nl  MS:  warm without deformities, calf tenderness, cyanosis or clubbing  SKIN: warm and dry without lesions    NEURO:  alert, approp, no deficits   CXR PA  and Lateral:   09/05/2014 :     I personally reviewed images and agree with radiology impression as follows:   No residual infiltrate. Minimal posterior atelectasis.            Assessment & Plan:

## 2014-10-17 NOTE — Patient Instructions (Signed)
stiolto 2 puffs first thing in am (think of it like high octane fuel)  Only use your duoneb as a rescue medication to be used if you can't catch your breath by resting or doing a relaxed purse lip breathing pattern.  - The less you use it, the better it will work when you need it. - Ok to use up to  every 4 hours if you must but call for immediate appointment if use goes up over your usual need    Dr Bettina Gavia may need to use alternative to  the corevidol if your breathing gets  worse  (bisoprolol is a good alternativ    If you are satisfied with your treatment plan,  let your doctor know and he/she can either refill your medications or you can return here when your prescription runs out.     If in any way you are not 100% satisfied,  please tell us.  If 100% better, tell your friends!  Pulmonary follow up is as needed

## 2014-10-18 ENCOUNTER — Encounter: Payer: Self-pay | Admitting: Internal Medicine

## 2014-10-18 DIAGNOSIS — E669 Obesity, unspecified: Secondary | ICD-10-CM

## 2014-10-18 HISTORY — DX: Obesity, unspecified: E66.9

## 2014-10-18 NOTE — Assessment & Plan Note (Signed)
Body mass index is 34.44 kg/(m^2).  No results found for: TSH   Complicated by dm/ hbp and low erv on pfts 10/17/14  And  gerd tendency/ doe/ needs to achieve and maintain neg calorie balance  > defer f/u primary care   ? TSH recently ?

## 2014-10-18 NOTE — Assessment & Plan Note (Signed)
-   09/05/2014  Walked RA x 3 laps @ 185 ft each stopped due to end of study, nl pace, no desat   - pfts 10/17/14 dlco 63% on Amiodarone > may benefit from 6 month f/u but defer to Cards or just follow clinically with serial walking sats since baseline good

## 2014-10-18 NOTE — Assessment & Plan Note (Signed)
Changed acei to ARB  09/05/14 due to pseudowheeze> improved 10/17/14  Adequate control on present rx, reviewed > no change in rx needed  For now though worry given the severity of his airflow obst that he may have adverse effect from coreg esp in high doses >> Strongly prefer in this setting: Bystolic, the most beta -1  selective Beta blocker available in sample form, with bisoprolol the most selective generic choice  on the market>  Defer to Dr Bettina Gavia

## 2014-10-18 NOTE — Assessment & Plan Note (Addendum)
-   PFTs   10/17/2014 FEV1  2.09 ( 53%) ratio 62 no sign better p B2 and dlco 63% and corrects to 85%    - 10/17/2014 p extensive coaching HFA effectiveness =  90% also confirmed with respimat   He has moderately severe copd plus major issue with abd obesity but good candidate for trial of stiolto and consider a new Beta blocker   I had an extended discussion with the patient reviewing all relevant studies completed to date and  lasting 15 to 20 minutes of a 25 minute visit     I reviewed the Fletcher curve with the patient that basically indicates  if you quit smoking when your best day FEV1 is still well preserved (as is relatively speaking   the case here)  it is highly unlikely you will progress to severe disease and informed the patient there was no medication on the market that has proven to alter the curve/ its downward trajectory  or the likelihood of progression of their disease.  Therefore stopping smoking and maintaining abstinence is the most important aspect of care, not choice of inhalers or for that matter, doctors.   Pulmonary f/u is therefore prn   Each maintenance medication was reviewed in detail including most importantly the difference between maintenance and prns and under what circumstances the prns are to be triggered using an action plan format that is not reflected in the computer generated alphabetically organized AVS.    Please see instructions for details which were reviewed in writing and the patient given a copy highlighting the part that I personally wrote and discussed at today's ov.

## 2015-09-04 ENCOUNTER — Other Ambulatory Visit: Payer: Self-pay | Admitting: Internal Medicine

## 2015-09-04 MED ORDER — VALSARTAN 80 MG PO TABS: 80.0000 mg | ORAL_TABLET | Freq: Every day | ORAL | Status: DC

## 2016-03-21 DIAGNOSIS — I517 Cardiomegaly: Secondary | ICD-10-CM | POA: Diagnosis not present

## 2016-03-21 DIAGNOSIS — R0602 Shortness of breath: Secondary | ICD-10-CM | POA: Diagnosis not present

## 2016-03-21 DIAGNOSIS — J069 Acute upper respiratory infection, unspecified: Secondary | ICD-10-CM | POA: Diagnosis not present

## 2016-03-21 DIAGNOSIS — Z951 Presence of aortocoronary bypass graft: Secondary | ICD-10-CM | POA: Diagnosis not present

## 2016-03-21 DIAGNOSIS — R079 Chest pain, unspecified: Secondary | ICD-10-CM | POA: Diagnosis not present

## 2016-03-21 DIAGNOSIS — I509 Heart failure, unspecified: Secondary | ICD-10-CM | POA: Diagnosis not present

## 2016-03-21 DIAGNOSIS — Z950 Presence of cardiac pacemaker: Secondary | ICD-10-CM | POA: Diagnosis not present

## 2016-03-21 DIAGNOSIS — Z79899 Other long term (current) drug therapy: Secondary | ICD-10-CM | POA: Diagnosis not present

## 2016-03-21 DIAGNOSIS — I10 Essential (primary) hypertension: Secondary | ICD-10-CM | POA: Diagnosis not present

## 2016-03-22 DIAGNOSIS — I2510 Atherosclerotic heart disease of native coronary artery without angina pectoris: Secondary | ICD-10-CM | POA: Diagnosis not present

## 2016-03-22 DIAGNOSIS — I110 Hypertensive heart disease with heart failure: Secondary | ICD-10-CM | POA: Diagnosis not present

## 2016-03-22 DIAGNOSIS — I5022 Chronic systolic (congestive) heart failure: Secondary | ICD-10-CM | POA: Diagnosis not present

## 2016-03-22 DIAGNOSIS — Z6833 Body mass index (BMI) 33.0-33.9, adult: Secondary | ICD-10-CM | POA: Diagnosis not present

## 2016-03-22 DIAGNOSIS — R079 Chest pain, unspecified: Secondary | ICD-10-CM | POA: Diagnosis not present

## 2016-03-23 DIAGNOSIS — Z6833 Body mass index (BMI) 33.0-33.9, adult: Secondary | ICD-10-CM | POA: Diagnosis not present

## 2016-03-23 DIAGNOSIS — I5022 Chronic systolic (congestive) heart failure: Secondary | ICD-10-CM | POA: Diagnosis not present

## 2016-03-29 DIAGNOSIS — I509 Heart failure, unspecified: Secondary | ICD-10-CM | POA: Diagnosis not present

## 2016-03-29 DIAGNOSIS — Z79899 Other long term (current) drug therapy: Secondary | ICD-10-CM | POA: Diagnosis not present

## 2016-04-06 DIAGNOSIS — I5022 Chronic systolic (congestive) heart failure: Secondary | ICD-10-CM | POA: Diagnosis not present

## 2016-04-06 DIAGNOSIS — I110 Hypertensive heart disease with heart failure: Secondary | ICD-10-CM | POA: Diagnosis not present

## 2016-04-06 DIAGNOSIS — Z6831 Body mass index (BMI) 31.0-31.9, adult: Secondary | ICD-10-CM | POA: Diagnosis not present

## 2016-04-06 DIAGNOSIS — Z79899 Other long term (current) drug therapy: Secondary | ICD-10-CM | POA: Diagnosis not present

## 2016-04-06 DIAGNOSIS — I483 Typical atrial flutter: Secondary | ICD-10-CM | POA: Diagnosis not present

## 2016-04-06 DIAGNOSIS — N183 Chronic kidney disease, stage 3 (moderate): Secondary | ICD-10-CM | POA: Diagnosis not present

## 2016-04-06 DIAGNOSIS — Z95810 Presence of automatic (implantable) cardiac defibrillator: Secondary | ICD-10-CM | POA: Diagnosis not present

## 2016-04-06 DIAGNOSIS — I2510 Atherosclerotic heart disease of native coronary artery without angina pectoris: Secondary | ICD-10-CM | POA: Diagnosis not present

## 2016-04-28 DIAGNOSIS — J101 Influenza due to other identified influenza virus with other respiratory manifestations: Secondary | ICD-10-CM | POA: Diagnosis not present

## 2016-05-18 DIAGNOSIS — I10 Essential (primary) hypertension: Secondary | ICD-10-CM | POA: Diagnosis not present

## 2016-05-18 DIAGNOSIS — I5022 Chronic systolic (congestive) heart failure: Secondary | ICD-10-CM | POA: Diagnosis not present

## 2016-05-18 DIAGNOSIS — I480 Paroxysmal atrial fibrillation: Secondary | ICD-10-CM | POA: Diagnosis not present

## 2016-05-18 DIAGNOSIS — Z6830 Body mass index (BMI) 30.0-30.9, adult: Secondary | ICD-10-CM | POA: Diagnosis not present

## 2016-05-18 DIAGNOSIS — E785 Hyperlipidemia, unspecified: Secondary | ICD-10-CM | POA: Diagnosis not present

## 2016-05-18 DIAGNOSIS — E1165 Type 2 diabetes mellitus with hyperglycemia: Secondary | ICD-10-CM | POA: Diagnosis not present

## 2016-05-18 DIAGNOSIS — Z79899 Other long term (current) drug therapy: Secondary | ICD-10-CM | POA: Diagnosis not present

## 2016-05-18 DIAGNOSIS — I472 Ventricular tachycardia: Secondary | ICD-10-CM | POA: Diagnosis not present

## 2016-05-18 DIAGNOSIS — E1121 Type 2 diabetes mellitus with diabetic nephropathy: Secondary | ICD-10-CM | POA: Diagnosis not present

## 2016-05-18 DIAGNOSIS — E039 Hypothyroidism, unspecified: Secondary | ICD-10-CM | POA: Diagnosis not present

## 2016-05-18 DIAGNOSIS — I110 Hypertensive heart disease with heart failure: Secondary | ICD-10-CM | POA: Diagnosis not present

## 2016-05-18 DIAGNOSIS — I25810 Atherosclerosis of coronary artery bypass graft(s) without angina pectoris: Secondary | ICD-10-CM | POA: Diagnosis not present

## 2016-05-25 DIAGNOSIS — H5203 Hypermetropia, bilateral: Secondary | ICD-10-CM | POA: Diagnosis not present

## 2016-05-25 DIAGNOSIS — H40023 Open angle with borderline findings, high risk, bilateral: Secondary | ICD-10-CM | POA: Diagnosis not present

## 2016-05-25 DIAGNOSIS — H35373 Puckering of macula, bilateral: Secondary | ICD-10-CM | POA: Diagnosis not present

## 2016-07-11 DIAGNOSIS — R062 Wheezing: Secondary | ICD-10-CM | POA: Diagnosis not present

## 2016-08-18 DIAGNOSIS — I5022 Chronic systolic (congestive) heart failure: Secondary | ICD-10-CM | POA: Diagnosis not present

## 2016-08-18 DIAGNOSIS — Z6831 Body mass index (BMI) 31.0-31.9, adult: Secondary | ICD-10-CM | POA: Diagnosis not present

## 2016-09-15 DIAGNOSIS — Z4502 Encounter for adjustment and management of automatic implantable cardiac defibrillator: Secondary | ICD-10-CM | POA: Diagnosis not present

## 2016-09-15 DIAGNOSIS — I472 Ventricular tachycardia: Secondary | ICD-10-CM | POA: Diagnosis not present

## 2016-10-03 DIAGNOSIS — I25810 Atherosclerosis of coronary artery bypass graft(s) without angina pectoris: Secondary | ICD-10-CM | POA: Diagnosis not present

## 2016-10-03 DIAGNOSIS — E1165 Type 2 diabetes mellitus with hyperglycemia: Secondary | ICD-10-CM | POA: Diagnosis not present

## 2016-10-03 DIAGNOSIS — I5022 Chronic systolic (congestive) heart failure: Secondary | ICD-10-CM | POA: Diagnosis not present

## 2016-10-03 DIAGNOSIS — Z79899 Other long term (current) drug therapy: Secondary | ICD-10-CM | POA: Diagnosis not present

## 2016-10-03 DIAGNOSIS — I10 Essential (primary) hypertension: Secondary | ICD-10-CM | POA: Diagnosis not present

## 2016-10-03 DIAGNOSIS — E039 Hypothyroidism, unspecified: Secondary | ICD-10-CM | POA: Diagnosis not present

## 2016-10-26 DIAGNOSIS — S0181XA Laceration without foreign body of other part of head, initial encounter: Secondary | ICD-10-CM | POA: Diagnosis not present

## 2016-10-31 DIAGNOSIS — E876 Hypokalemia: Secondary | ICD-10-CM | POA: Diagnosis not present

## 2016-11-01 ENCOUNTER — Telehealth: Payer: Self-pay | Admitting: Cardiology

## 2016-11-01 ENCOUNTER — Other Ambulatory Visit: Payer: Self-pay

## 2016-11-01 NOTE — Telephone Encounter (Signed)
Patient states that if he scratches his arm on something he will bleed, not too much, but more than just a scratch should be. Patient states that he is taking 81 mg aspirin. Advised patient that this is to be expected with taking aspirin, but if he begins to bleed and it doesn't stop to let us know. Patient states that he has increased his Lasix dose and his weight seems to be holding steady with this dosage. Advised patient if he gained any more weight to call and let us know. Appointment moved up to 11/08/16 from 11/15/16 to discuss his concerns with Dr. Bettina Gavia.

## 2016-11-01 NOTE — Telephone Encounter (Signed)
Please call patient regarding his Intresto and him bleeding a lot even if scratched slightly and weight gain, Patient is scheduled with [facility] for appt.

## 2016-11-08 ENCOUNTER — Encounter: Payer: Self-pay | Admitting: Cardiology

## 2016-11-08 ENCOUNTER — Ambulatory Visit (INDEPENDENT_AMBULATORY_CARE_PROVIDER_SITE_OTHER): Payer: Medicare Other | Admitting: Cardiology

## 2016-11-08 VITALS — BP 138/78 | HR 60 | Ht 70.0 in | Wt 226.0 lb

## 2016-11-08 DIAGNOSIS — I472 Ventricular tachycardia, unspecified: Secondary | ICD-10-CM

## 2016-11-08 DIAGNOSIS — I110 Hypertensive heart disease with heart failure: Secondary | ICD-10-CM | POA: Diagnosis not present

## 2016-11-08 DIAGNOSIS — I2510 Atherosclerotic heart disease of native coronary artery without angina pectoris: Secondary | ICD-10-CM

## 2016-11-08 DIAGNOSIS — I5022 Chronic systolic (congestive) heart failure: Secondary | ICD-10-CM | POA: Diagnosis not present

## 2016-11-08 DIAGNOSIS — I483 Typical atrial flutter: Secondary | ICD-10-CM

## 2016-11-08 DIAGNOSIS — Z79899 Other long term (current) drug therapy: Secondary | ICD-10-CM | POA: Diagnosis not present

## 2016-11-08 DIAGNOSIS — E785 Hyperlipidemia, unspecified: Secondary | ICD-10-CM

## 2016-11-08 DIAGNOSIS — Z95810 Presence of automatic (implantable) cardiac defibrillator: Secondary | ICD-10-CM | POA: Diagnosis not present

## 2016-11-08 NOTE — Progress Notes (Signed)
Cardiology Office Note:    Date:  11/08/2016   ID:  James [name], DOB 1950-06-02, MRN [id number]  PCP:  Ernestene Kiel, MD  Cardiologist:  Shirlee More, MD    Referring MD: Ernestene Kiel, MD   please continue to check liver function and lipids along with TSH every 6 months.   ASSESSMENT:    1. Systolic CHF, chronic ([place])   2. Hypertensive heart failure ([place])   3. CAD in native artery   4. Typical atrial flutter ([place])   5. Ventricular tachyarrhythmia ([place])   6. On amiodarone therapy   7. Hyperlipidemia, unspecified hyperlipidemia type   8. S/P ICD (internal cardiac defibrillator) procedure    PLAN: ontinue current medical treatment i clopidogrel and his anticoagulant anemia discontinue aspirin    In order of problems listed above:  1. Stable compensated mildly fluid overloaded he will increase his diuretic to achieve ideal weight and tightness dietary restriction. He'll continue current guideline direct medical treatment including beta blocker and ARNI. 2. Stable continue current treatment 3. Stable continue current treatment with excessive bruising he will continue clopidogrel. Aspirin and continue high intensity statin ranolazine and beta blocker 4. Stable continue low-dose amiodarone 5. Stable ICD is followed in my previous practice and we'll transition to our device clinic and continue low-dose amiodarone 6. Stable continue amiodarone 7. Stable continues high intensity statin 8. Stable he will transition to EP in device clinic and CHF in [facility]   Next appointment: 6 months   Medication Adjustments/Labs and Tests Ordered: Current medicines are reviewed at length with the patient today.  Concerns regarding medicines are outlined above.  No orders of the defined types were placed in this encounter.  No orders of the defined types were placed in this encounter.   No chief complaint on file.   History of Present Illness:    James [name] is a 66 y.o.  male with a hx of CAD, CHF EF 30%, Atrial Flutter, VT, Dyslipidemia, HTN, S/P CABG in 2008, on amiodarone and ICD last seen three months ago.  Overall he is done well and is pleased with the quality of his life he says is the best he's felt in the last 10+ years. He [facility] 30-45 [facility] and has had no chest pain shortness of breath palpitation syncope or TIA but he is had dietary indiscretion with sodium and his weight is up 6-8 pounds being managed by the patient and his wife increasing his loop diuretic. His device clinic for note is reviewed he's had no ICD shock or alerts. He remains on amiodarone labs followed in his PCP office.. Compliance with diet, lifestyle and medications: Yes Past Medical History:  Diagnosis Date  . Atrial arrhythmia 02/15/2014  . CAD in native artery 08/27/2014  . Cardiac dysrhythmia 02/12/2014  . CKD stage 3 due to type 1 diabetes mellitus ([place]) 08/28/2014   Overview:  Cr 1.3 at discharge 08/21/14  . COPD GOLD II 10/17/2014   Followed in Pulmonary clinic/ [facility]/ [place]  - PFTs   10/17/2014 FEV1  2.09 ( 53%) ratio 62 no sign better p B2 and dlco 63% and corrects to 85%  - 10/17/2014 p extensive coaching HFA effectiveness =  90%    > try stiolto    . DM (diabetes mellitus) ([place])   . Dyspnea 09/05/2014   Followed in Pulmonary clinic/  [facility]/ [place] - 09/05/2014  Walked RA x 3 laps @ 185 ft each stopped  due to end of study, nl pace, no desat   - pfts 10/17/14 dlco 63% on Amiodarone > may benefit from 6 month f/u but defer to Cards    . Essential hypertension 09/11/2014   Changed acei to ARB  09/05/14 due to pseudowheeze> improved 10/17/14    . Heart failure ([place])   . Hyperlipidemia   . Hypertension   . Hypertensive heart disease with congestive heart failure ([place]) 08/27/2014  . ICD (implantable cardioverter-defibrillator) in place 08/27/2014  . NSTEMI (non-ST elevated myocardial infarction) ([place]) 02/12/2014  . Obesity 9/67/8938    Complicated by dm/ hbp and low erv on pfts 10/17/14    . On amiodarone therapy 08/27/2014  . S/P ICD (internal cardiac defibrillator) procedure 02/15/2014  . Systolic CHF, chronic ([place]) 08/27/2014   Overview:  BNP 1350 at discharge K 3.9 on 08/21/14 Echo EF 35% May 2016  . Typical atrial flutter ([place]) 08/27/2014  . Ventricular tachyarrhythmia ([place]) 08/27/2014    Past Surgical History:  Procedure Laterality Date  . CORONARY ARTERY BYPASS GRAFT  2008  . EYE SURGERY    . ICD IMPLANT      Current Medications: No outpatient prescriptions have been marked as taking for the 11/08/16 encounter (Appointment) with Richardo Priest, MD.     Allergies:   Codeine   Social History   Social History  . Marital status: Married    Spouse name: N/A  . Number of children: N/A  . Years of education: N/A   Occupational History  . Sales     Social History Main Topics  . Smoking status: Former Smoker    Packs/day: 1.00    Years: 38.00    Types: Cigarettes    Quit date: 04/04/2006  . Smokeless tobacco: Never Used  . Alcohol use No  . Drug use: No  . Sexual activity: Not on file   Other Topics Concern  . Not on file   Social History Narrative  . No narrative on file     Family History: The patient's family history includes Breast cancer in his mother; Cancer in his daughter; Heart disease in his father and mother. ROS:   Please see the history of present illness.    All other systems reviewed and are negative.  EKGs/Labs/Other Studies Reviewed:    The following studies were reviewed today:  EKG:  EKG ordered today.  The ekg ordered today demonstrates Sinus rhythm nonspecific conduction delay old inferior MI  Recent Labs: Requested from his PCP No results found for requested labs within last 8760 hours.  Recent Lipid Panel No results found for: CHOL, TRIG, HDL, CHOLHDL, VLDL, LDLCALC, LDLDIRECT  Physical Exam:    VS:  There were no vitals taken for this visit.    Wt Readings from  Last 3 Encounters:  10/17/14 261 lb (118.4 kg)  09/09/14 256 lb (116.1 kg)     GEN:  Well nourished, well developed in no acute distress HEENT: Normal NECK: No JVD; No carotid bruits LYMPHATICS: No lymphadenopathy CARDIAC: RRR, no murmurs, rubs, gallops RESPIRATORY:  Clear to auscultation without rales, wheezing or rhonchi  ABDOMEN: Soft, non-tender, non-distended MUSCULOSKELETAL:  No edema; No deformity  SKIN: Warm and dry NEUROLOGIC:  Alert and oriented x 3 PSYCHIATRIC:  Normal affect    Signed, Shirlee More, MD  11/08/2016 1:14 PM    [facility]

## 2016-11-08 NOTE — Patient Instructions (Signed)
Medication Instructions:  Your physician recommends that you continue on your current medications as directed. Please refer to the Current Medication list given to you today.   Labwork: None   Testing/Procedures:  You had an EKG today   Follow-Up: Your physician wants you to follow-up in:6 Months.  You will receive a reminder letter in the mail two months in advance. If you don't receive a letter, please call our office to schedule the follow-up appointment.  Any Other Special Instructions Will Be Listed Below (If Applicable).     If you need a refill on your cardiac medications before your next appointment, please call your pharmacy.

## 2016-11-15 ENCOUNTER — Ambulatory Visit: Payer: BLUE CROSS/BLUE SHIELD | Admitting: Cardiology

## 2016-11-17 ENCOUNTER — Other Ambulatory Visit: Payer: Self-pay | Admitting: Cardiology

## 2016-11-18 ENCOUNTER — Telehealth: Payer: Self-pay | Admitting: Cardiology

## 2016-11-18 ENCOUNTER — Other Ambulatory Visit: Payer: Self-pay

## 2016-11-18 MED ORDER — ATORVASTATIN CALCIUM 40 MG PO TABS: 40.0000 mg | ORAL_TABLET | Freq: Every day | ORAL | 3 refills | Status: DC

## 2016-11-18 NOTE — Telephone Encounter (Signed)
Call lipitor to [place]

## 2016-11-18 NOTE — Telephone Encounter (Signed)
Refill sent.

## 2016-11-24 ENCOUNTER — Other Ambulatory Visit: Payer: Self-pay | Admitting: Cardiology

## 2016-11-24 ENCOUNTER — Other Ambulatory Visit: Payer: Self-pay

## 2016-11-24 MED ORDER — ISOSORBIDE DINITRATE 30 MG PO TABS
15.0000 mg | ORAL_TABLET | Freq: Every day | ORAL | 2 refills | Status: DC
Start: 2016-11-24 — End: 2017-10-19

## 2016-12-08 ENCOUNTER — Other Ambulatory Visit: Payer: Self-pay | Admitting: Cardiology

## 2016-12-16 DIAGNOSIS — Z4502 Encounter for adjustment and management of automatic implantable cardiac defibrillator: Secondary | ICD-10-CM | POA: Diagnosis not present

## 2016-12-16 DIAGNOSIS — I214 Non-ST elevation (NSTEMI) myocardial infarction: Secondary | ICD-10-CM | POA: Diagnosis not present

## 2016-12-26 DIAGNOSIS — I480 Paroxysmal atrial fibrillation: Secondary | ICD-10-CM | POA: Diagnosis not present

## 2016-12-26 DIAGNOSIS — I25810 Atherosclerosis of coronary artery bypass graft(s) without angina pectoris: Secondary | ICD-10-CM | POA: Diagnosis not present

## 2016-12-26 DIAGNOSIS — E1121 Type 2 diabetes mellitus with diabetic nephropathy: Secondary | ICD-10-CM | POA: Diagnosis not present

## 2016-12-26 DIAGNOSIS — R51 Headache: Secondary | ICD-10-CM | POA: Diagnosis not present

## 2016-12-26 DIAGNOSIS — I10 Essential (primary) hypertension: Secondary | ICD-10-CM | POA: Diagnosis not present

## 2016-12-26 DIAGNOSIS — I5022 Chronic systolic (congestive) heart failure: Secondary | ICD-10-CM | POA: Diagnosis not present

## 2016-12-26 DIAGNOSIS — Z79899 Other long term (current) drug therapy: Secondary | ICD-10-CM | POA: Diagnosis not present

## 2016-12-26 DIAGNOSIS — E785 Hyperlipidemia, unspecified: Secondary | ICD-10-CM | POA: Diagnosis not present

## 2016-12-26 DIAGNOSIS — E039 Hypothyroidism, unspecified: Secondary | ICD-10-CM | POA: Diagnosis not present

## 2016-12-28 ENCOUNTER — Telehealth: Payer: Self-pay

## 2016-12-28 MED ORDER — POTASSIUM CHLORIDE CRYS ER 20 MEQ PO TBCR: 20.0000 meq | EXTENDED_RELEASE_TABLET | Freq: Two times a day (BID) | ORAL | Status: DC

## 2016-12-28 NOTE — Telephone Encounter (Signed)
-----   Message from Richardo Priest, MD sent at 12/28/2016  3:59 PM EDT ----- Increase K to 20 meq BID, check his BMP and Mag in 3 weeks

## 2016-12-28 NOTE — Telephone Encounter (Signed)
Pt advised per Dr Bettina Gavia to increase potassium 69meq daily to potassium 74meq BID, and recheck BMP and Mag in 3 weeks.  Pt will recheck labs at Dr Felipa Emory office.   Orders faxed to office to have rechecked.   Pt verbalized understanding

## 2017-01-16 DIAGNOSIS — I10 Essential (primary) hypertension: Secondary | ICD-10-CM | POA: Diagnosis not present

## 2017-01-16 DIAGNOSIS — E1121 Type 2 diabetes mellitus with diabetic nephropathy: Secondary | ICD-10-CM | POA: Diagnosis not present

## 2017-01-16 DIAGNOSIS — Z79899 Other long term (current) drug therapy: Secondary | ICD-10-CM | POA: Diagnosis not present

## 2017-01-16 DIAGNOSIS — E876 Hypokalemia: Secondary | ICD-10-CM | POA: Diagnosis not present

## 2017-01-20 ENCOUNTER — Encounter: Payer: Medicare Other | Admitting: Internal Medicine

## 2017-01-20 ENCOUNTER — Telehealth: Payer: Self-pay

## 2017-01-20 NOTE — Telephone Encounter (Signed)
Pt aware that Dr Bettina Gavia received lab results from Dr Felipa [facility] office, and to continue current medications with increased dose of potassium 75meq BID and keep flup appts as planned. Pt verbalized understanding.

## 2017-01-30 DIAGNOSIS — Z23 Encounter for immunization: Secondary | ICD-10-CM | POA: Diagnosis not present

## 2017-02-06 DIAGNOSIS — I10 Essential (primary) hypertension: Secondary | ICD-10-CM | POA: Diagnosis not present

## 2017-02-09 DIAGNOSIS — I5022 Chronic systolic (congestive) heart failure: Secondary | ICD-10-CM | POA: Diagnosis not present

## 2017-02-09 DIAGNOSIS — I110 Hypertensive heart disease with heart failure: Secondary | ICD-10-CM | POA: Diagnosis not present

## 2017-02-17 DIAGNOSIS — I10 Essential (primary) hypertension: Secondary | ICD-10-CM | POA: Diagnosis not present

## 2017-02-17 DIAGNOSIS — Z79899 Other long term (current) drug therapy: Secondary | ICD-10-CM | POA: Diagnosis not present

## 2017-03-17 DIAGNOSIS — Z79899 Other long term (current) drug therapy: Secondary | ICD-10-CM | POA: Diagnosis not present

## 2017-03-17 DIAGNOSIS — Z4502 Encounter for adjustment and management of automatic implantable cardiac defibrillator: Secondary | ICD-10-CM | POA: Diagnosis not present

## 2017-03-20 ENCOUNTER — Other Ambulatory Visit: Payer: Self-pay | Admitting: Cardiology

## 2017-03-21 NOTE — Telephone Encounter (Signed)
Left message to return call 

## 2017-03-21 NOTE — Telephone Encounter (Signed)
Left message to return call to confirm that patient is still taking this medication.

## 2017-04-05 DIAGNOSIS — M79671 Pain in right foot: Secondary | ICD-10-CM | POA: Diagnosis not present

## 2017-04-05 DIAGNOSIS — I10 Essential (primary) hypertension: Secondary | ICD-10-CM | POA: Diagnosis not present

## 2017-04-11 ENCOUNTER — Other Ambulatory Visit: Payer: Self-pay | Admitting: Cardiology

## 2017-04-17 ENCOUNTER — Ambulatory Visit (INDEPENDENT_AMBULATORY_CARE_PROVIDER_SITE_OTHER): Payer: Medicare Other | Admitting: Podiatry

## 2017-04-17 ENCOUNTER — Other Ambulatory Visit: Payer: Self-pay | Admitting: Cardiology

## 2017-04-17 ENCOUNTER — Encounter: Payer: Self-pay | Admitting: Podiatry

## 2017-04-17 ENCOUNTER — Ambulatory Visit (INDEPENDENT_AMBULATORY_CARE_PROVIDER_SITE_OTHER): Payer: Medicare Other

## 2017-04-17 ENCOUNTER — Telehealth: Payer: Self-pay | Admitting: *Deleted

## 2017-04-17 VITALS — BP 154/79 | HR 63 | Ht 70.0 in | Wt 215.0 lb

## 2017-04-17 DIAGNOSIS — M79671 Pain in right foot: Secondary | ICD-10-CM | POA: Diagnosis not present

## 2017-04-17 DIAGNOSIS — D219 Benign neoplasm of connective and other soft tissue, unspecified: Secondary | ICD-10-CM

## 2017-04-17 DIAGNOSIS — M7741 Metatarsalgia, right foot: Secondary | ICD-10-CM

## 2017-04-17 NOTE — Telephone Encounter (Signed)
[facility] scheduled pt for [place] of right lower extremity 04/19/2017 arrive 3:00pm for [place] 3:30pm.

## 2017-04-17 NOTE — Progress Notes (Signed)
Subjective:  Patient ID: James [name]., male    DOB: 1950-06-13,  MRN: [id number]  Chief Complaint  Patient presents with  . Foot Pain    lump in the ball of my right foot towards toes betweem 1st and 2nd   . Diabetes    Diabetic foot exam    67 y.o. male presents with the above complaint. Reports painful lump in the ball of his right foot that has been present for 3 weeks.  Reports growth since that time.  Diabetic did not check his sugar this morning last size by primary care doctor last week. PCP Dr. Laqueta Due  Past Medical History:  Diagnosis Date  . Atrial arrhythmia 02/15/2014  . CAD in native artery 08/27/2014  . Cardiac dysrhythmia 02/12/2014  . CKD stage 3 due to type 1 diabetes mellitus ([place]) 08/28/2014   Overview:  Cr 1.3 at discharge 08/21/14  . COPD GOLD II 10/17/2014   Followed in Pulmonary clinic/ [facility]/ [place]  - PFTs   10/17/2014 FEV1  2.09 ( 53%) ratio 62 no sign better p B2 and dlco 63% and corrects to 85%  - 10/17/2014 p extensive coaching HFA effectiveness =  90%    > try stiolto    . DM (diabetes mellitus) ([place])   . Dyspnea 09/05/2014   Followed in Pulmonary clinic/ [facility]/ [place] - 09/05/2014  Walked RA x 3 laps @ 185 ft each stopped due to end of study, nl pace, no desat   - pfts 10/17/14 dlco 63% on Amiodarone > may benefit from 6 month f/u but defer to Cards    . Essential hypertension 09/11/2014   Changed acei to ARB  09/05/14 due to pseudowheeze> improved 10/17/14    . Heart failure ([place])   . Hyperlipidemia   . Hypertension   . Hypertensive heart disease with congestive heart failure ([place]) 08/27/2014  . ICD (implantable cardioverter-defibrillator) in place 08/27/2014  . NSTEMI (non-ST elevated myocardial infarction) ([place]) 02/12/2014  . Obesity 4/62/7035   Complicated by dm/ hbp and low erv on pfts 10/17/14    . On amiodarone therapy 08/27/2014  . S/P ICD (internal cardiac defibrillator) procedure 02/15/2014  . Systolic CHF, chronic ([place]) 08/27/2014    Overview:  BNP 1350 at discharge K 3.9 on 08/21/14 Echo EF 35% May 2016  . Typical atrial flutter ([place]) 08/27/2014  . Ventricular tachyarrhythmia ([place]) 08/27/2014   Past Surgical History:  Procedure Laterality Date  . CORONARY ARTERY BYPASS GRAFT  2008  . EYE SURGERY    . ICD IMPLANT      Current Outpatient Medications:  .  amiodarone (PACERONE) 400 MG tablet, Take 400 mg by mouth daily., Disp: , Rfl:  .  atorvastatin (LIPITOR) 40 MG tablet, Take 1 tablet (40 mg total) by mouth daily., Disp: 90 tablet, Rfl: 3 .  carvedilol (COREG) 25 MG tablet, Take 25 mg by mouth 2 (two) times daily with a meal., Disp: , Rfl:  .  Coenzyme Q10 (CO Q 10 PO), Take 100 mg by mouth daily., Disp: , Rfl:  .  ENTRESTO 97-103 MG, TAKE ONE (1) TABLET BY MOUTH TWO (2) TIMES DAILY, Disp: 180 tablet, Rfl: 3 .  fenofibrate 160 MG tablet, Take 160 mg by mouth daily., Disp: , Rfl:  .  furosemide (LASIX) 40 MG tablet, TAKE THREE TABLETS TWICE DAILY, Disp: 180 tablet, Rfl: 3 .  isosorbide dinitrate (ISORDIL) 30 MG tablet, Take 0.5 tablets (15 mg total) by mouth daily., Disp: 90 tablet, Rfl:  2 .  levothyroxine (SYNTHROID, LEVOTHROID) 88 MCG tablet, Take 88 mcg by mouth daily., Disp: , Rfl:  .  metFORMIN (GLUCOPHAGE) 1000 MG tablet, Take 1,000 mg by mouth 2 (two) times daily with a meal. , Disp: , Rfl:  .  Multiple Vitamin (MULTIVITAMIN) capsule, Take 1 capsule by mouth daily., Disp: , Rfl:  .  nitroGLYCERIN (NITROSTAT) 0.4 MG SL tablet, Place 0.4 mg under the tongue every 5 (five) minutes as needed for chest pain., Disp: , Rfl:  .  Omega-3 Fatty Acids (FISH OIL PO), Take 2 capsules by mouth 2 (two) times daily. , Disp: , Rfl:  .  potassium chloride SA (K-DUR,KLOR-CON) 20 MEQ tablet, Take 1 tablet (20 mEq total) by mouth 2 (two) times daily., Disp: , Rfl:  .  amiodarone (PACERONE) 200 MG tablet, TAKE 2 TABLETS BY MOUTH ONCE DAILY, Disp: 180 tablet, Rfl: 1 .  aspirin EC 81 MG tablet, Take 81 mg by mouth daily., Disp: , Rfl:    .  insulin detemir (LEVEMIR) 100 UNIT/ML injection, Inject 5 Units into the skin daily. , Disp: , Rfl:  .  ranitidine (ZANTAC) 150 MG tablet, Take 150 mg by mouth 2 (two) times daily., Disp: , Rfl:  .  ranolazine (RANEXA) 500 MG 12 hr tablet, Take 250 mg by mouth 2 (two) times daily. , Disp: , Rfl:  .  spironolactone (ALDACTONE) 25 MG tablet, , Disp: , Rfl:   Allergies  Allergen Reactions  . Codeine Rash   Review of Systems Objective:   Vitals:   04/17/17 1027  BP: (!) 154/79  Pulse: 63   General AA&O x3. Normal mood and affect.  Vascular Dorsalis pedis and posterior tibial pulses  present 2+ bilaterally  Capillary refill normal to all digits. Pedal hair growth normal.  Neurologic Epicritic sensation grossly present.  Dermatologic No open lesions. Interspaces clear of maceration. Nails well groomed and normal in appearance. Firm nodular mass R 1st interspace plantarly. Immobile.  Orthopedic: MMT 5/5 in dorsiflexion, plantarflexion, inversion, and eversion. Normal joint ROM without pain or crepitus. Pain to palpation about the mass   Assessment & Plan:  Patient was evaluated and treated and all questions answered.  ?Fibroma vs Lipoma -Order [place] for further eval. -XR taken today. No underlying osseous abnormality at area of concern. -Due to rapid growth, will order [place]. Plan for possible MRI/removal.  Return in about 2 weeks (around 05/01/2017) for [place] f/u.

## 2017-04-17 NOTE — Telephone Encounter (Signed)
Left message informing pt of the [facility] appt 04/19/2017 at 3:30pm to arrive 3:00pm, and to call me to confirm.

## 2017-04-17 NOTE — Telephone Encounter (Signed)
-----   Message from Evelina Bucy, DPM sent at 04/17/2017 10:32 AM EST ----- Can we order a diagnostic ultrasound of the R forefoot? To be done at [facility]  Dx: fibroma vs lipoma  Thanks!

## 2017-04-27 NOTE — Telephone Encounter (Signed)
Pt called states he was to have an appt for MRI and no one has called.

## 2017-04-27 NOTE — Telephone Encounter (Signed)
Pt called states he did not get the message that he was scheduled for 04/19/2017 and I told him that was fine he could contact [facility] [phone] and schedule at his convenience.

## 2017-04-27 NOTE — Telephone Encounter (Signed)
I left message informing pt that he needed to call concerning the [place] scheduled.

## 2017-04-28 ENCOUNTER — Other Ambulatory Visit: Payer: Self-pay | Admitting: Podiatry

## 2017-04-28 DIAGNOSIS — D219 Benign neoplasm of connective and other soft tissue, unspecified: Secondary | ICD-10-CM

## 2017-04-28 DIAGNOSIS — M79671 Pain in right foot: Secondary | ICD-10-CM

## 2017-04-28 DIAGNOSIS — R2241 Localized swelling, mass and lump, right lower limb: Secondary | ICD-10-CM | POA: Diagnosis not present

## 2017-05-01 ENCOUNTER — Encounter: Payer: Self-pay | Admitting: Podiatry

## 2017-05-01 ENCOUNTER — Ambulatory Visit (INDEPENDENT_AMBULATORY_CARE_PROVIDER_SITE_OTHER): Payer: Medicare Other | Admitting: Podiatry

## 2017-05-01 DIAGNOSIS — D219 Benign neoplasm of connective and other soft tissue, unspecified: Secondary | ICD-10-CM | POA: Diagnosis not present

## 2017-05-01 NOTE — Progress Notes (Signed)
  Subjective:  Patient ID: James [name]., male    DOB: [birth year],  MRN: [id number]  Chief Complaint  Patient presents with  . Foot Problem    fu from US done on right foot    67 y.o. male returns for the above complaint.  States that it is starting to feel little better.  Has been using pads on the area.  Had ultrasound performed on Friday.  Objective:  There were no vitals filed for this visit. General AA&O x3. Normal mood and affect.  Vascular Pedal pulses palpable.  Neurologic Epicritic sensation grossly intact.  Dermatologic No open lesions. Skin normal texture and turgor. Immobile firm mass right first interspace  Orthopedic: No pain to palpation either foot. Pain to palpation right first interspace mass   Assessment & Plan:  Patient was evaluated and treated and all questions answered.  Fibroma 1st Interspace R Foot -[place] reviewed.  Hypoechoic mildly heterogeneous mass without vascularity drained 12 x 4 x 10 mm -Injection as below. -Discussed removal should the lesion because more pain.  Will monitor closely for signs of growth patient advised should it be noted to grow to present back for evaluation.   Return in about 4 weeks (around 05/29/2017) for Fibroma f/u.

## 2017-05-09 DIAGNOSIS — E039 Hypothyroidism, unspecified: Secondary | ICD-10-CM | POA: Diagnosis not present

## 2017-05-09 DIAGNOSIS — E1121 Type 2 diabetes mellitus with diabetic nephropathy: Secondary | ICD-10-CM | POA: Diagnosis not present

## 2017-05-09 DIAGNOSIS — I110 Hypertensive heart disease with heart failure: Secondary | ICD-10-CM | POA: Diagnosis not present

## 2017-05-09 DIAGNOSIS — I5022 Chronic systolic (congestive) heart failure: Secondary | ICD-10-CM | POA: Diagnosis not present

## 2017-05-09 DIAGNOSIS — E785 Hyperlipidemia, unspecified: Secondary | ICD-10-CM | POA: Diagnosis not present

## 2017-05-09 DIAGNOSIS — I480 Paroxysmal atrial fibrillation: Secondary | ICD-10-CM | POA: Diagnosis not present

## 2017-05-09 DIAGNOSIS — I25810 Atherosclerosis of coronary artery bypass graft(s) without angina pectoris: Secondary | ICD-10-CM | POA: Diagnosis not present

## 2017-05-09 DIAGNOSIS — Z79899 Other long term (current) drug therapy: Secondary | ICD-10-CM | POA: Diagnosis not present

## 2017-05-11 DIAGNOSIS — C44622 Squamous cell carcinoma of skin of right upper limb, including shoulder: Secondary | ICD-10-CM | POA: Diagnosis not present

## 2017-05-11 DIAGNOSIS — L821 Other seborrheic keratosis: Secondary | ICD-10-CM | POA: Diagnosis not present

## 2017-05-11 DIAGNOSIS — C44319 Basal cell carcinoma of skin of other parts of face: Secondary | ICD-10-CM | POA: Diagnosis not present

## 2017-05-29 ENCOUNTER — Ambulatory Visit: Payer: Medicare Other | Admitting: Podiatry

## 2017-05-30 DIAGNOSIS — R7989 Other specified abnormal findings of blood chemistry: Secondary | ICD-10-CM | POA: Diagnosis not present

## 2017-05-31 DIAGNOSIS — C44622 Squamous cell carcinoma of skin of right upper limb, including shoulder: Secondary | ICD-10-CM | POA: Diagnosis not present

## 2017-06-16 DIAGNOSIS — Z4502 Encounter for adjustment and management of automatic implantable cardiac defibrillator: Secondary | ICD-10-CM | POA: Diagnosis not present

## 2017-06-20 DIAGNOSIS — Z79899 Other long term (current) drug therapy: Secondary | ICD-10-CM | POA: Diagnosis not present

## 2017-06-29 ENCOUNTER — Telehealth: Payer: Self-pay | Admitting: Cardiology

## 2017-06-29 MED ORDER — CARVEDILOL 25 MG PO TABS: 25.0000 mg | ORAL_TABLET | Freq: Two times a day (BID) | ORAL | 1 refills | Status: DC

## 2017-06-29 NOTE — Telephone Encounter (Signed)
Refill sent.

## 2017-06-29 NOTE — Telephone Encounter (Signed)
Patient needs his Carvedilol called into [facility] on [address] in [place] please

## 2017-07-02 NOTE — Progress Notes (Signed)
Cardiology Office Note:    Date:  07/03/2017   ID:  James [name]., DOB [birth year], MRN [id number]  PCP:  Ernestene Kiel, MD  Cardiologist:  Shirlee More, MD    Referring MD: Ernestene Kiel, MD    ASSESSMENT:    1. Hyperlipidemia, unspecified hyperlipidemia type   2. On amiodarone therapy   3. Systolic CHF, chronic ([place])   4. Hypertensive heart disease with chronic combined systolic and diastolic congestive heart failure ([place])   5. CAD in native artery   6. Typical atrial flutter ([place])   7. Ventricular tachyarrhythmia ([place])   8. ICD (implantable cardioverter-defibrillator) in place    PLAN:    In order of problems listed above:  1. Stable he reduce his a atorvastatin by 50% with muscle weakness. 2. Continue low-dose amiodarone I will continue ask his PCP every 6 months check a TSH and CMP for liver and thyroid toxicity and chest x-ray is ordered to screen for lung toxicity 3. Stable compensated continue current treatment including beta-blocker loop diuretic and ARNI New York Heart Association class I 4. Stable blood pressure at target continue current treatment 5. Stable CAD continue medical treatment I do not think he requires an ischemia evaluation at this time 6. Stable in sinus rhythm continue low-dose amiodarone 7. Stable continue low-dose amiodarone 8. He will continue to follow with [facility] practice until I return to [place] and then consider switching to my practice for device therapy.   Next appointment: 4 months   Medication Adjustments/Labs and Tests Ordered: Current medicines are reviewed at length with the patient today.  Concerns regarding medicines are outlined above.  Orders Placed This Encounter  Procedures  . DG Chest 2 View   Meds ordered this encounter  Medications  . atorvastatin (LIPITOR) 40 MG tablet    Sig: Take 1 tablet (40 mg total) by mouth daily.    Dispense:  90 tablet    Refill:  3    Chief Complaint  Patient presents  with  . Follow-up    History of Present Illness:    James [name]. is a 67 y.o. male with a hx of CAD, CHF EF 30%, Atrial Flutter, VT, Dyslipidemia, HTN, S/P CABG in 2008, on amiodarone and ICD last seen 11/08/16.  ASSESSMENT:    11/08/16   1. Systolic CHF, chronic ([place])   2. Hypertensive heart failure ([place])   3. CAD in native artery   4. Typical atrial flutter ([place])   5. Ventricular tachyarrhythmia ([place])   6. On amiodarone therapy   7. Hyperlipidemia, unspecified hyperlipidemia type   8. S/P ICD (internal cardiac defibrillator) procedure    PLAN: ontinue current medical treatment i clopidogrel and his anticoagulant anemia discontinue aspirin    1.   Stable compensated mildly fluid overloaded he will increase his diuretic to achieve ideal weight and tightness dietary restriction. He'll continue current guideline direct medical treatment including beta blocker and ARNI. 9. Stable continue current treatment 10. Stable continue current treatment with excessive bruising he will continue clopidogrel. Aspirin and continue high intensity statin ranolazine and beta blocker 11. Stable continue low-dose amiodarone 12. Stable ICD is followed in my previous practice and we'll transition to our device clinic and continue low-dose amiodarone 13. Stable continue amiodarone 14. Stable continues high intensity statin 15. Stable he will transition to EP in device clinic and CHF in [facility]   Compliance with diet, lifestyle and medications: Yes He is in a good phase of his illness  he continues to exercise walks daily no shortness of breath chest pain palpitation or syncope but notices leg weakness since his increase the dose of atorvastatin.  He continues to follow his ICD in the [facility] practice and [place].  Recent labs reviewed from his PCP office stable creatinine 1.4 Past Medical History:  Diagnosis Date  . Atrial arrhythmia 02/15/2014  . CAD in native artery 08/27/2014  . Cardiac  dysrhythmia 02/12/2014  . CKD stage 3 due to type 1 diabetes mellitus ([place]) 08/28/2014   Overview:  Cr 1.3 at discharge 08/21/14  . COPD GOLD II 10/17/2014   Followed in Pulmonary clinic/ [facility]/ [place]  - PFTs   10/17/2014 FEV1  2.09 ( 53%) ratio 62 no sign better p B2 and dlco 63% and corrects to 85%  - 10/17/2014 p extensive coaching HFA effectiveness =  90%    > try stiolto    . DM (diabetes mellitus) ([place])   . Dyspnea 09/05/2014   Followed in Pulmonary clinic/ [facility]/ [place] - 09/05/2014  Walked RA x 3 laps @ 185 ft each stopped due to end of study, nl pace, no desat   - pfts 10/17/14 dlco 63% on Amiodarone > may benefit from 6 month f/u but defer to Cards    . Essential hypertension 09/11/2014   Changed acei to ARB  09/05/14 due to pseudowheeze> improved 10/17/14    . Heart failure ([place])   . Hyperlipidemia   . Hypertension   . Hypertensive heart disease with congestive heart failure ([place]) 08/27/2014  . ICD (implantable cardioverter-defibrillator) in place 08/27/2014  . NSTEMI (non-ST elevated myocardial infarction) ([place]) 02/12/2014  . Obesity 2/70/3500   Complicated by dm/ hbp and low erv on pfts 10/17/14    . On amiodarone therapy 08/27/2014  . S/P ICD (internal cardiac defibrillator) procedure 02/15/2014  . Systolic CHF, chronic ([place]) 08/27/2014   Overview:  BNP 1350 at discharge K 3.9 on 08/21/14 Echo EF 35% May 2016  . Typical atrial flutter ([place]) 08/27/2014  . Ventricular tachyarrhythmia ([place]) 08/27/2014    Past Surgical History:  Procedure Laterality Date  . CORONARY ARTERY BYPASS GRAFT  2008  . EYE SURGERY    . ICD IMPLANT    . MOHS SURGERY     Right Arm    Current Medications: Current Meds  Medication Sig  . amiodarone (PACERONE) 200 MG tablet TAKE 2 TABLETS BY MOUTH ONCE DAILY  . atorvastatin (LIPITOR) 40 MG tablet Take 1 tablet (40 mg total) by mouth daily.  . carvedilol (COREG) 25 MG tablet Take 1 tablet (25 mg total) by mouth 2 (two) times daily with a meal.    . Coenzyme Q10 (CO Q 10 PO) Take 100 mg by mouth daily.  Marland Kitchen ENTRESTO 97-103 MG TAKE ONE (1) TABLET BY MOUTH TWO (2) TIMES DAILY  . fenofibrate 160 MG tablet TAKE ONE TABLET BY MOUTH DAILY  . furosemide (LASIX) 40 MG tablet TAKE 3 TABLETS BY MOUTH TWICE DAILY. (Patient taking differently: TAKE 3 TABLETS BY MOUTH TWICE DAILY, PENDING WEIGHT)  . insulin detemir (LEVEMIR) 100 UNIT/ML injection Inject 5 Units into the skin daily.   . isosorbide dinitrate (ISORDIL) 30 MG tablet Take 0.5 tablets (15 mg total) by mouth daily.  Marland Kitchen levothyroxine (SYNTHROID, LEVOTHROID) 88 MCG tablet Take 88 mcg by mouth daily.  . metFORMIN (GLUCOPHAGE) 1000 MG tablet Take 1,000 mg by mouth 2 (two) times daily with a meal.   . Multiple Vitamin (MULTIVITAMIN) capsule Take 1 capsule by  mouth daily.  . nitroGLYCERIN (NITROSTAT) 0.4 MG SL tablet Place 0.4 mg under the tongue every 5 (five) minutes as needed for chest pain.  . Omega-3 Fatty Acids (FISH OIL PO) Take 2 capsules by mouth 2 (two) times daily.   . potassium chloride SA (K-DUR,KLOR-CON) 20 MEQ tablet Take 1 tablet (20 mEq total) by mouth 2 (two) times daily.  . ranitidine (ZANTAC) 150 MG tablet Take 150 mg by mouth 2 (two) times daily.  . ranolazine (RANEXA) 500 MG 12 hr tablet Take 250 mg by mouth 2 (two) times daily.   Marland Kitchen spironolactone (ALDACTONE) 25 MG tablet Take 25 mg by mouth daily.   . [DISCONTINUED] atorvastatin (LIPITOR) 40 MG tablet Take 1 tablet (40 mg total) by mouth daily. (Patient taking differently: Take 80 mg by mouth daily. )     Allergies:   Codeine   Social History   Socioeconomic History  . Marital status: Married    Spouse name: Not on file  . Number of children: Not on file  . Years of education: Not on file  . Highest education level: Not on file  Occupational History  . Occupation: Press photographer   Social Needs  . Financial resource strain: Not on file  . Food insecurity:    Worry: Not on file    Inability: Not on file  . Transportation  needs:    Medical: Not on file    Non-medical: Not on file  Tobacco Use  . Smoking status: Former Smoker    Packs/day: 1.00    Years: 38.00    Pack years: 38.00    Types: Cigarettes    Last attempt to quit: 04/04/2006    Years since quitting: 11.2  . Smokeless tobacco: Never Used  Substance and Sexual Activity  . Alcohol use: No    Alcohol/week: 0.0 oz  . Drug use: No  . Sexual activity: Not on file  Lifestyle  . Physical activity:    Days per week: Not on file    Minutes per session: Not on file  . Stress: Not on file  Relationships  . Social connections:    Talks on phone: Not on file    Gets together: Not on file    Attends religious service: Not on file    Active member of club or organization: Not on file    Attends meetings of clubs or organizations: Not on file    Relationship status: Not on file  Other Topics Concern  . Not on file  Social History Narrative  . Not on file     Family History: The patient's family history includes Breast cancer in his mother; Cancer in his daughter; Heart disease in his father and mother. ROS:   Please see the history of present illness.    All other systems reviewed and are negative.  EKGs/Labs/Other Studies Reviewed:    The following studies were reviewed today:    Recent Labs: No results found for requested labs within last 8760 hours.  Recent Lipid Panel No results found for: CHOL, TRIG, HDL, CHOLHDL, VLDL, LDLCALC, LDLDIRECT  Physical Exam:    VS:  BP 110/62   Pulse 61   Ht 5\' 10"  (1.778 m)   Wt 223 lb 6.4 oz (101.3 kg)   SpO2 94%   BMI 32.05 kg/m     Wt Readings from Last 3 Encounters:  07/03/17 223 lb 6.4 oz (101.3 kg)  04/17/17 215 lb (97.5 kg)  11/08/16 226 lb (102.5 kg)  GEN:  Well nourished, well developed in no acute distress HEENT: Normal NECK: No JVD; No carotid bruits LYMPHATICS: No lymphadenopathy CARDIAC: soft s1 RRR, no murmurs, rubs, gallops RESPIRATORY:  Clear to auscultation without  rales, wheezing or rhonchi  ABDOMEN: Soft, non-tender, non-distended MUSCULOSKELETAL:  No edema; No deformity  SKIN: Warm and dry NEUROLOGIC:  Alert and oriented x 3 PSYCHIATRIC:  Normal affect    Signed, Shirlee More, MD  07/03/2017 1:04 PM    [facility]

## 2017-07-03 ENCOUNTER — Ambulatory Visit (INDEPENDENT_AMBULATORY_CARE_PROVIDER_SITE_OTHER): Payer: Medicare Other | Admitting: Cardiology

## 2017-07-03 ENCOUNTER — Encounter: Payer: Self-pay | Admitting: Cardiology

## 2017-07-03 ENCOUNTER — Ambulatory Visit (HOSPITAL_BASED_OUTPATIENT_CLINIC_OR_DEPARTMENT_OTHER)
Admission: RE | Admit: 2017-07-03 | Discharge: 2017-07-03 | Disposition: A | Discharge status: Home / Self Care | Payer: Medicare Other | Source: Ambulatory Visit | Attending: Cardiology | Admitting: Cardiology

## 2017-07-03 VITALS — BP 110/62 | HR 61 | Ht 70.0 in | Wt 223.4 lb

## 2017-07-03 DIAGNOSIS — I110 Hypertensive heart disease with heart failure: Secondary | ICD-10-CM

## 2017-07-03 DIAGNOSIS — I5042 Chronic combined systolic (congestive) and diastolic (congestive) heart failure: Secondary | ICD-10-CM

## 2017-07-03 DIAGNOSIS — I483 Typical atrial flutter: Secondary | ICD-10-CM

## 2017-07-03 DIAGNOSIS — I2510 Atherosclerotic heart disease of native coronary artery without angina pectoris: Secondary | ICD-10-CM | POA: Diagnosis not present

## 2017-07-03 DIAGNOSIS — Z79899 Other long term (current) drug therapy: Secondary | ICD-10-CM

## 2017-07-03 DIAGNOSIS — Z950 Presence of cardiac pacemaker: Secondary | ICD-10-CM | POA: Insufficient documentation

## 2017-07-03 DIAGNOSIS — I517 Cardiomegaly: Secondary | ICD-10-CM | POA: Insufficient documentation

## 2017-07-03 DIAGNOSIS — E785 Hyperlipidemia, unspecified: Secondary | ICD-10-CM

## 2017-07-03 DIAGNOSIS — I5022 Chronic systolic (congestive) heart failure: Secondary | ICD-10-CM

## 2017-07-03 DIAGNOSIS — I472 Ventricular tachycardia, unspecified: Secondary | ICD-10-CM

## 2017-07-03 DIAGNOSIS — Z95810 Presence of automatic (implantable) cardiac defibrillator: Secondary | ICD-10-CM | POA: Diagnosis not present

## 2017-07-03 MED ORDER — ATORVASTATIN CALCIUM 40 MG PO TABS: 40.0000 mg | ORAL_TABLET | Freq: Every day | ORAL | 3 refills | Status: DC

## 2017-07-03 NOTE — Patient Instructions (Signed)
Medication Instructions:  Your physician has recommended you make the following change in your medication:  DECREASE atorvastatin to 40 mg daily.  Labwork: None  Testing/Procedures: A chest x-ray takes a picture of the organs and structures inside the chest, including the heart, lungs, and blood vessels. This test can show several things, including, whether the heart is enlarges; whether fluid is building up in the lungs; and whether pacemaker / defibrillator leads are still in place.  Follow-Up: Your physician wants you to follow-up in: 4 months. You will receive a reminder letter in the mail two months in advance. If you don't receive a letter, please call our office to schedule the follow-up appointment.  Any Other Special Instructions Will Be Listed Below (If Applicable).     If you need a refill on your cardiac medications before your next appointment, please call your pharmacy.

## 2017-09-06 DIAGNOSIS — E785 Hyperlipidemia, unspecified: Secondary | ICD-10-CM | POA: Diagnosis not present

## 2017-09-06 DIAGNOSIS — Z79899 Other long term (current) drug therapy: Secondary | ICD-10-CM | POA: Diagnosis not present

## 2017-09-06 DIAGNOSIS — I110 Hypertensive heart disease with heart failure: Secondary | ICD-10-CM | POA: Diagnosis not present

## 2017-09-06 DIAGNOSIS — I10 Essential (primary) hypertension: Secondary | ICD-10-CM | POA: Diagnosis not present

## 2017-09-06 DIAGNOSIS — R5383 Other fatigue: Secondary | ICD-10-CM | POA: Diagnosis not present

## 2017-09-06 DIAGNOSIS — E039 Hypothyroidism, unspecified: Secondary | ICD-10-CM | POA: Diagnosis not present

## 2017-09-06 DIAGNOSIS — I5022 Chronic systolic (congestive) heart failure: Secondary | ICD-10-CM | POA: Diagnosis not present

## 2017-09-06 DIAGNOSIS — E1121 Type 2 diabetes mellitus with diabetic nephropathy: Secondary | ICD-10-CM | POA: Diagnosis not present

## 2017-09-06 DIAGNOSIS — I25708 Atherosclerosis of coronary artery bypass graft(s), unspecified, with other forms of angina pectoris: Secondary | ICD-10-CM | POA: Diagnosis not present

## 2017-09-06 DIAGNOSIS — R0602 Shortness of breath: Secondary | ICD-10-CM | POA: Diagnosis not present

## 2017-09-15 DIAGNOSIS — Z4502 Encounter for adjustment and management of automatic implantable cardiac defibrillator: Secondary | ICD-10-CM | POA: Diagnosis not present

## 2017-09-18 ENCOUNTER — Telehealth: Payer: Self-pay | Admitting: Cardiology

## 2017-09-18 NOTE — Telephone Encounter (Signed)
Patient follows up with [facility] and Vascular Device Team. Patient has not been evaluated by the [facility] team, he was waiting until they began seeing patients in [place]. Patient is currently stable at this time. Advised for patient to contact [facility] and Vascular for follow-up regarding defibrillator going off. Verbalized understanding, no further questions.

## 2017-09-18 NOTE — Telephone Encounter (Signed)
His defibrillator went off this morning but he's fine

## 2017-09-25 DIAGNOSIS — N183 Chronic kidney disease, stage 3 (moderate): Secondary | ICD-10-CM | POA: Diagnosis not present

## 2017-09-25 DIAGNOSIS — I509 Heart failure, unspecified: Secondary | ICD-10-CM | POA: Diagnosis not present

## 2017-09-25 DIAGNOSIS — K148 Other diseases of tongue: Secondary | ICD-10-CM | POA: Insufficient documentation

## 2017-09-25 DIAGNOSIS — I4891 Unspecified atrial fibrillation: Secondary | ICD-10-CM | POA: Diagnosis not present

## 2017-09-25 DIAGNOSIS — I2510 Atherosclerotic heart disease of native coronary artery without angina pectoris: Secondary | ICD-10-CM | POA: Diagnosis not present

## 2017-09-25 DIAGNOSIS — T82198A Other mechanical complication of other cardiac electronic device, initial encounter: Secondary | ICD-10-CM | POA: Diagnosis not present

## 2017-09-25 DIAGNOSIS — I499 Cardiac arrhythmia, unspecified: Secondary | ICD-10-CM | POA: Diagnosis not present

## 2017-09-25 DIAGNOSIS — K1329 Other disturbances of oral epithelium, including tongue: Secondary | ICD-10-CM | POA: Diagnosis not present

## 2017-09-25 DIAGNOSIS — Z951 Presence of aortocoronary bypass graft: Secondary | ICD-10-CM | POA: Diagnosis not present

## 2017-09-25 DIAGNOSIS — I472 Ventricular tachycardia: Secondary | ICD-10-CM | POA: Diagnosis not present

## 2017-09-25 DIAGNOSIS — E1022 Type 1 diabetes mellitus with diabetic chronic kidney disease: Secondary | ICD-10-CM | POA: Diagnosis not present

## 2017-09-25 DIAGNOSIS — I130 Hypertensive heart and chronic kidney disease with heart failure and stage 1 through stage 4 chronic kidney disease, or unspecified chronic kidney disease: Secondary | ICD-10-CM | POA: Diagnosis not present

## 2017-09-25 DIAGNOSIS — Z955 Presence of coronary angioplasty implant and graft: Secondary | ICD-10-CM | POA: Diagnosis not present

## 2017-09-25 DIAGNOSIS — Z4502 Encounter for adjustment and management of automatic implantable cardiac defibrillator: Secondary | ICD-10-CM | POA: Diagnosis not present

## 2017-09-26 DIAGNOSIS — E1121 Type 2 diabetes mellitus with diabetic nephropathy: Secondary | ICD-10-CM | POA: Diagnosis not present

## 2017-09-26 DIAGNOSIS — R7989 Other specified abnormal findings of blood chemistry: Secondary | ICD-10-CM | POA: Diagnosis not present

## 2017-09-26 DIAGNOSIS — B370 Candidal stomatitis: Secondary | ICD-10-CM | POA: Diagnosis not present

## 2017-09-28 DIAGNOSIS — C44622 Squamous cell carcinoma of skin of right upper limb, including shoulder: Secondary | ICD-10-CM | POA: Diagnosis not present

## 2017-10-04 ENCOUNTER — Other Ambulatory Visit: Payer: Self-pay | Admitting: Cardiology

## 2017-10-09 ENCOUNTER — Telehealth: Payer: Self-pay | Admitting: Cardiology

## 2017-10-09 MED ORDER — RANOLAZINE ER 500 MG PO TB12: 250.0000 mg | ORAL_TABLET | Freq: Two times a day (BID) | ORAL | 3 refills | Status: DC

## 2017-10-09 NOTE — Telephone Encounter (Signed)
Rx for Ranexa sent to [facility] as requested.

## 2017-10-09 NOTE — Telephone Encounter (Signed)
Has questions why his prescription was denied

## 2017-10-10 DIAGNOSIS — K143 Hypertrophy of tongue papillae: Secondary | ICD-10-CM | POA: Diagnosis not present

## 2017-10-10 DIAGNOSIS — M5431 Sciatica, right side: Secondary | ICD-10-CM | POA: Diagnosis not present

## 2017-10-10 DIAGNOSIS — I480 Paroxysmal atrial fibrillation: Secondary | ICD-10-CM | POA: Diagnosis not present

## 2017-10-18 NOTE — Progress Notes (Signed)
Cardiology Office Note:    Date:  10/19/2017   ID:  [name], DOB 08/16/1950, MRN [id number]  PCP:  Ernestene Kiel, MD  Cardiologist:  Shirlee More, MD    Referring MD: Ernestene Kiel, MD    ASSESSMENT:    1. Ventricular tachyarrhythmia ([place])   2. Systolic CHF, chronic ([place])   3. Hypertensive heart disease with chronic combined systolic and diastolic congestive heart failure ([place])   4. Coronary artery disease involving native coronary artery of native heart with angina pectoris ([place])   5. Typical atrial flutter ([place])   6. Hyperlipidemia, unspecified hyperlipidemia type   7. CKD stage 3 due to type 1 diabetes mellitus ([place])   8. On amiodarone therapy    PLAN:    In order of problems listed above:  1. Said no clinical recurrence we will continue amiodarone his levels are relatively low he has not been seen by EP in several years and after discussion with the patient he asked me to make an appointment referral for Dr. Ola Spurr and opinion on amiodarone therapy dose and the clinical importance of his recent device treatment. 2. Stable compensated continue his current diuretic 3. Stable home blood pressures are consistently in range continue his current diuretic he has decrease his dietary sodium and requires a lower dose of loop diuretic MRA ARNI and beta-blocker 4. Stable recently when walking uphill he had very mild angina his oral nitrate is increased no recurrence I am hesitant to refer him to coronary angiography at this time.  I asked him to push his physical efforts and if he has limiting symptoms on medical therapy I would refer to coronary angiography. 5. Stable no recurrence continue amiodarone 6. Stable continue current lipid-lowering therapy 7. He was referred by Nigel Sloop, PA to nephrology consultation 8. He was referred to Dr. Adrian Prows, EP.   Next appointment: 3 months   Medication Adjustments/Labs and Tests Ordered: Current medicines are  reviewed at length with the patient today.  Concerns regarding medicines are outlined above.  No orders of the defined types were placed in this encounter.  No orders of the defined types were placed in this encounter.   Chief Complaint  Patient presents with  . Congestive Heart Failure  . Coronary Artery Disease  . Follow-up    nsvt, ATP and ICD discharge recently    History of Present Illness:    James [name]. is a 67 y.o. male with a hx of CAD, CHF EF 30%, Atrial Flutter, VT, Dyslipidemia, HTN, S/P CABG in 2008, on amiodarone and ICD last seen 0/01/19.  His ICD has been followed at [facility] cardiology and most recently [facility].    Recently he had device therapy.  He was seen by Nigel Sloop, PA device was interrogated amiodarone dosage was increased and he was referred for nephrology consultation.  On 09/25/2017 creatinine was 1.6 stable potassium 4.1 magnesium 2.3 and his amiodarone level was relatively low.  His device check showed VT at a rate of approximately 160 bpm with successful antitachycardia pacing  Compliance with diet, lifestyle and medications: Yes  He is unsettled by his recent events he is unsure about doing activities.  On their own they decreased his diuretic but also decreased his sodium loading Gatorade from his weights are down he is not doing his regular walk every day but has had no anginal discomfort since his oral mononitrate was increased.  I reviewed with him that this is his  first device therapy since implantation his amiodarone level was relatively low and encouraged him to be seen by electrophysiology for an opinion and he requests referral to Dr. Ola Spurr.  He has no edema orthopnea palpitation. Past Medical History:  Diagnosis Date  . Atrial arrhythmia 02/15/2014  . CAD in native artery 08/27/2014  . Cardiac dysrhythmia 02/12/2014  . CKD stage 3 due to type 1 diabetes mellitus ([place]) 08/28/2014   Overview:  Cr 1.3 at discharge 08/21/14    . COPD GOLD II 10/17/2014   Followed in Pulmonary clinic/ [facility]/ [place]  - PFTs   10/17/2014 FEV1  2.09 ( 53%) ratio 62 no sign better p B2 and dlco 63% and corrects to 85%  - 10/17/2014 p extensive coaching HFA effectiveness =  90%    > try stiolto    . DM (diabetes mellitus) ([place])   . Dyspnea 09/05/2014   Followed in Pulmonary clinic/ [facility]/ [place] - 09/05/2014  Walked RA x 3 laps @ 185 ft each stopped due to end of study, nl pace, no desat   - pfts 10/17/14 dlco 63% on Amiodarone > may benefit from 6 month f/u but defer to Cards    . Essential hypertension 09/11/2014   Changed acei to ARB  09/05/14 due to pseudowheeze> improved 10/17/14    . Heart failure ([place])   . Hyperlipidemia   . Hypertension   . Hypertensive heart disease with congestive heart failure ([place]) 08/27/2014  . ICD (implantable cardioverter-defibrillator) in place 08/27/2014  . NSTEMI (non-ST elevated myocardial infarction) ([place]) 02/12/2014  . Obesity 1/93/7902   Complicated by dm/ hbp and low erv on pfts 10/17/14    . On amiodarone therapy 08/27/2014  . S/P ICD (internal cardiac defibrillator) procedure 02/15/2014  . Systolic CHF, chronic ([place]) 08/27/2014   Overview:  BNP 1350 at discharge K 3.9 on 08/21/14 Echo EF 35% May 2016  . Typical atrial flutter ([place]) 08/27/2014  . Ventricular tachyarrhythmia ([place]) 08/27/2014    Past Surgical History:  Procedure Laterality Date  . CORONARY ARTERY BYPASS GRAFT  2008  . EYE SURGERY    . ICD IMPLANT    . MOHS SURGERY     Right Arm    Current Medications: Current Meds  Medication Sig  . amiodarone (PACERONE) 200 MG tablet TAKE 2 TABLETS BY MOUTH ONCE DAILY  . atorvastatin (LIPITOR) 40 MG tablet Take 1 tablet (40 mg total) by mouth daily.  . carvedilol (COREG) 25 MG tablet Take 1 tablet (25 mg total) by mouth 2 (two) times daily with a meal.  . Coenzyme Q10 (CO Q 10 PO) Take 100 mg by mouth daily.  Marland Kitchen ENTRESTO 97-103 MG TAKE ONE (1) TABLET BY MOUTH TWO (2) TIMES DAILY   . fenofibrate 160 MG tablet TAKE ONE TABLET BY MOUTH DAILY  . furosemide (LASIX) 40 MG tablet TAKE 3 TABLETS BY MOUTH TWICE DAILY. (Patient taking differently: TAKE 3 TABLETS BY MOUTH TWICE DAILY, PENDING WEIGHT)  . insulin detemir (LEVEMIR) 100 UNIT/ML injection Inject 5 Units into the skin daily.   . isosorbide mononitrate (IMDUR) 60 MG 24 hr tablet Take 1 tablet by mouth daily.  Marland Kitchen levothyroxine (SYNTHROID, LEVOTHROID) 88 MCG tablet Take 88 mcg by mouth daily.  . metFORMIN (GLUCOPHAGE) 1000 MG tablet Take 1,000 mg by mouth 2 (two) times daily with a meal.   . Multiple Vitamin (MULTIVITAMIN) capsule Take 1 capsule by mouth daily.  . nitroGLYCERIN (NITROSTAT) 0.4 MG SL tablet Place 0.4 mg under the tongue  every 5 (five) minutes as needed for chest pain.  . Omega-3 Fatty Acids (FISH OIL PO) Take 2 capsules by mouth 2 (two) times daily.   . potassium chloride SA (K-DUR,KLOR-CON) 20 MEQ tablet Take 1 tablet (20 mEq total) by mouth 2 (two) times daily.  . ranitidine (ZANTAC) 150 MG tablet Take 150 mg by mouth 2 (two) times daily.  . ranolazine (RANEXA) 500 MG 12 hr tablet Take 1 tablet (500 mg total) by mouth 2 (two) times daily.  Marland Kitchen spironolactone (ALDACTONE) 25 MG tablet Take 25 mg by mouth daily.      Allergies:   Codeine   Social History   Socioeconomic History  . Marital status: Married    Spouse name: Not on file  . Number of children: Not on file  . Years of education: Not on file  . Highest education level: Not on file  Occupational History  . Occupation: Press photographer   Social Needs  . Financial resource strain: Not on file  . Food insecurity:    Worry: Not on file    Inability: Not on file  . Transportation needs:    Medical: Not on file    Non-medical: Not on file  Tobacco Use  . Smoking status: Former Smoker    Packs/day: 1.00    Years: 38.00    Pack years: 38.00    Types: Cigarettes    Last attempt to quit: 04/04/2006    Years since quitting: 11.5  . Smokeless tobacco:  Never Used  Substance and Sexual Activity  . Alcohol use: No    Alcohol/week: 0.0 oz  . Drug use: No  . Sexual activity: Not on file  Lifestyle  . Physical activity:    Days per week: Not on file    Minutes per session: Not on file  . Stress: Not on file  Relationships  . Social connections:    Talks on phone: Not on file    Gets together: Not on file    Attends religious service: Not on file    Active member of club or organization: Not on file    Attends meetings of clubs or organizations: Not on file    Relationship status: Not on file  Other Topics Concern  . Not on file  Social History Narrative  . Not on file     Family History: The patient's family history includes Breast cancer in his mother; Cancer in his daughter; Heart disease in his father and mother. ROS:   Please see the history of present illness.    All other systems reviewed and are negative.  EKGs/Labs/Other Studies Reviewed:    The following studies were reviewed today:  EKG:  EKG ordered today.  The ekg ordered today demonstrates atrial paced rhythm old inferior MI nonspecific T wave  Recent Labs:   See HPI No results found for requested labs within last 8760 hours.  Recent Lipid Panel No results found for: CHOL, TRIG, HDL, CHOLHDL, VLDL, LDLCALC, LDLDIRECT  Physical Exam:    VS:  BP (!) 160/78 (BP Location: Right Arm, Patient Position: Sitting, Cuff Size: Normal)   Pulse 64   Ht 5\' 10"  (1.778 m)   Wt 218 lb (98.9 kg)   SpO2 97%   BMI 31.28 kg/m     Wt Readings from Last 3 Encounters:  10/19/17 218 lb (98.9 kg)  07/03/17 223 lb 6.4 oz (101.3 kg)  04/17/17 215 lb (97.5 kg)     GEN:  Well nourished, well developed  in no acute distress HEENT: Normal NECK: No JVD; No carotid bruits LYMPHATICS: No lymphadenopathy CARDIAC: RRR, no murmurs, rubs, gallops RESPIRATORY:  Clear to auscultation without rales, wheezing or rhonchi  ABDOMEN: Soft, non-tender, non-distended MUSCULOSKELETAL:  No  edema; No deformity  SKIN: Warm and dry NEUROLOGIC:  Alert and oriented x 3 PSYCHIATRIC:  Normal affect    Signed, Shirlee More, MD  10/19/2017 8:23 AM    [place]

## 2017-10-19 ENCOUNTER — Ambulatory Visit (INDEPENDENT_AMBULATORY_CARE_PROVIDER_SITE_OTHER): Payer: Medicare Other | Admitting: Cardiology

## 2017-10-19 ENCOUNTER — Encounter: Payer: Self-pay | Admitting: Cardiology

## 2017-10-19 VITALS — BP 160/78 | HR 64 | Ht 70.0 in | Wt 218.0 lb

## 2017-10-19 DIAGNOSIS — I5042 Chronic combined systolic (congestive) and diastolic (congestive) heart failure: Secondary | ICD-10-CM | POA: Diagnosis not present

## 2017-10-19 DIAGNOSIS — I472 Ventricular tachycardia, unspecified: Secondary | ICD-10-CM

## 2017-10-19 DIAGNOSIS — Z79899 Other long term (current) drug therapy: Secondary | ICD-10-CM

## 2017-10-19 DIAGNOSIS — N183 Chronic kidney disease, stage 3 unspecified: Secondary | ICD-10-CM

## 2017-10-19 DIAGNOSIS — E1022 Type 1 diabetes mellitus with diabetic chronic kidney disease: Secondary | ICD-10-CM | POA: Diagnosis not present

## 2017-10-19 DIAGNOSIS — E785 Hyperlipidemia, unspecified: Secondary | ICD-10-CM | POA: Diagnosis not present

## 2017-10-19 DIAGNOSIS — I5022 Chronic systolic (congestive) heart failure: Secondary | ICD-10-CM

## 2017-10-19 DIAGNOSIS — I110 Hypertensive heart disease with heart failure: Secondary | ICD-10-CM | POA: Diagnosis not present

## 2017-10-19 DIAGNOSIS — I483 Typical atrial flutter: Secondary | ICD-10-CM | POA: Diagnosis not present

## 2017-10-19 DIAGNOSIS — I25119 Atherosclerotic heart disease of native coronary artery with unspecified angina pectoris: Secondary | ICD-10-CM

## 2017-10-19 NOTE — Patient Instructions (Addendum)
Medication Instructions:  Your physician recommends that you continue on your current medications as directed. Please refer to the Current Medication list given to you today.   Labwork: NONE  Testing/Procedures: You had an EKG today.   Follow-Up: Your physician wants you to follow-up in: 3 months. You will receive a reminder letter in the mail two months in advance. If you don't receive a letter, please call our office to schedule the follow-up appointment.  We have put referral in for you to see Dr Adrian Prows.  If you have not received a call from their office in 1 week, please contact our office.   Any Other Special Instructions Will Be Listed Below (If Applicable).     If you need a refill on your cardiac medications before your next appointment, please call your pharmacy.     Heart Failure  Weigh yourself every morning when you first wake up and record on a calender or note pad, bring this to your office visits. Using a pill tender can help with taking your medications consistently.  Limit your fluid intake to 2 liters daily  Limit your sodium intake to less than 2-3 grams daily. Ask if you need dietary teaching.  If you gain more than 3 pounds (from your dry weight ), double your dose of diuretic for the day.  If you gain more than 5 pounds (from your dry weight), double your dose of lasix and call your heart failure doctor.  Please do not smoke tobacco since it is very bad for your heart.  Please do not drink alcohol since it can worsen your heart failure.Also avoid OTC nonsteroidal drugs, such as advil, aleve and motrin.  Try to exercise for at least 30 minutes every day because this will help your heart be more efficient. You may be eligible for supervised cardiac rehab, ask your physician.

## 2017-10-26 DIAGNOSIS — E1022 Type 1 diabetes mellitus with diabetic chronic kidney disease: Secondary | ICD-10-CM | POA: Diagnosis not present

## 2017-10-26 DIAGNOSIS — N189 Chronic kidney disease, unspecified: Secondary | ICD-10-CM | POA: Diagnosis not present

## 2017-10-26 DIAGNOSIS — N183 Chronic kidney disease, stage 3 (moderate): Secondary | ICD-10-CM | POA: Diagnosis not present

## 2017-10-26 DIAGNOSIS — I129 Hypertensive chronic kidney disease with stage 1 through stage 4 chronic kidney disease, or unspecified chronic kidney disease: Secondary | ICD-10-CM | POA: Diagnosis not present

## 2017-10-26 DIAGNOSIS — I5022 Chronic systolic (congestive) heart failure: Secondary | ICD-10-CM | POA: Diagnosis not present

## 2017-10-26 HISTORY — DX: Chronic systolic (congestive) heart failure: I50.22

## 2017-10-27 ENCOUNTER — Other Ambulatory Visit: Payer: Self-pay

## 2017-10-27 ENCOUNTER — Other Ambulatory Visit: Payer: Self-pay | Admitting: Cardiology

## 2017-10-27 ENCOUNTER — Telehealth: Payer: Self-pay | Admitting: Cardiology

## 2017-10-27 MED ORDER — AMIODARONE HCL 200 MG PO TABS: 400.0000 mg | ORAL_TABLET | Freq: Every day | ORAL | 2 refills | Status: DC

## 2017-10-27 NOTE — Telephone Encounter (Signed)
Med refill has been sent. 

## 2017-10-27 NOTE — Telephone Encounter (Signed)
Call amiodarone to [place] [facility] ([place])

## 2017-10-31 DIAGNOSIS — I25769 Atherosclerosis of bypass graft of coronary artery of transplanted heart with unspecified angina pectoris: Secondary | ICD-10-CM | POA: Diagnosis not present

## 2017-10-31 DIAGNOSIS — I472 Ventricular tachycardia: Secondary | ICD-10-CM | POA: Diagnosis not present

## 2017-11-02 DIAGNOSIS — N183 Chronic kidney disease, stage 3 (moderate): Secondary | ICD-10-CM | POA: Diagnosis not present

## 2017-11-02 DIAGNOSIS — N2889 Other specified disorders of kidney and ureter: Secondary | ICD-10-CM | POA: Diagnosis not present

## 2017-11-03 DIAGNOSIS — R9439 Abnormal result of other cardiovascular function study: Secondary | ICD-10-CM

## 2017-11-03 HISTORY — DX: Abnormal result of other cardiovascular function study: R94.39

## 2017-11-07 ENCOUNTER — Telehealth: Payer: Self-pay | Admitting: Cardiology

## 2017-11-07 NOTE — Telephone Encounter (Signed)
Patients wife [name] wanted to let you know that patient has a right kidney mass, and is supposed to follow up with nephrology on 11-15-17.  She states he is scheduled to have LHC on 11-21-17, but I do not see where we ordered LHC.   Please advise.

## 2017-11-07 NOTE — Telephone Encounter (Signed)
Is scheduled for a cath but has had some other things happen and wants to talk to a nurse about it

## 2017-11-08 NOTE — Telephone Encounter (Signed)
Left message on cell phone to notify patient and his wife James [name] that Dr Bettina Gavia is aware of findings, and they should contact Dr Blane Ohara practice to also make him aware since he was the physician ordering the [place].  Advised to contact our office with any other issues or concerns.

## 2017-11-10 DIAGNOSIS — M5136 Other intervertebral disc degeneration, lumbar region: Secondary | ICD-10-CM | POA: Diagnosis not present

## 2017-11-10 DIAGNOSIS — M5431 Sciatica, right side: Secondary | ICD-10-CM | POA: Diagnosis not present

## 2017-11-15 DIAGNOSIS — N2889 Other specified disorders of kidney and ureter: Secondary | ICD-10-CM | POA: Diagnosis not present

## 2017-11-21 DIAGNOSIS — I4892 Unspecified atrial flutter: Secondary | ICD-10-CM | POA: Diagnosis not present

## 2017-11-21 DIAGNOSIS — N183 Chronic kidney disease, stage 3 (moderate): Secondary | ICD-10-CM | POA: Diagnosis not present

## 2017-11-21 DIAGNOSIS — I129 Hypertensive chronic kidney disease with stage 1 through stage 4 chronic kidney disease, or unspecified chronic kidney disease: Secondary | ICD-10-CM | POA: Diagnosis not present

## 2017-11-21 DIAGNOSIS — Z87891 Personal history of nicotine dependence: Secondary | ICD-10-CM | POA: Diagnosis not present

## 2017-11-21 DIAGNOSIS — I25810 Atherosclerosis of coronary artery bypass graft(s) without angina pectoris: Secondary | ICD-10-CM | POA: Diagnosis not present

## 2017-11-21 DIAGNOSIS — Z951 Presence of aortocoronary bypass graft: Secondary | ICD-10-CM | POA: Diagnosis not present

## 2017-11-21 DIAGNOSIS — R9439 Abnormal result of other cardiovascular function study: Secondary | ICD-10-CM | POA: Diagnosis not present

## 2017-11-21 DIAGNOSIS — I2510 Atherosclerotic heart disease of native coronary artery without angina pectoris: Secondary | ICD-10-CM | POA: Diagnosis not present

## 2017-11-21 DIAGNOSIS — I480 Paroxysmal atrial fibrillation: Secondary | ICD-10-CM | POA: Diagnosis not present

## 2017-11-21 DIAGNOSIS — I2582 Chronic total occlusion of coronary artery: Secondary | ICD-10-CM | POA: Diagnosis not present

## 2017-11-24 ENCOUNTER — Telehealth: Payer: Self-pay | Admitting: Cardiology

## 2017-11-24 NOTE — Telephone Encounter (Signed)
States Dr Metta Clines was supposed to get in touch with Dr Bettina Gavia about his medications

## 2017-11-27 ENCOUNTER — Telehealth: Payer: Self-pay | Admitting: Cardiology

## 2017-11-27 NOTE — Telephone Encounter (Signed)
Patient had a cath at [facility] and he needs to know what to do now?? Please call patient .

## 2017-11-27 NOTE — Telephone Encounter (Signed)
Informed patient to see Dr. Ola Spurr after his cardiac catheterization because he was the ordering provider. Patient verbally understands.

## 2017-11-28 NOTE — Telephone Encounter (Signed)
Spoke with [name] to notify her that no correspondence has been made between Dr Bettina Gavia and Dr Metta Clines.  [name] states that patient has an appointment with Dr Ola Spurr and they will discuss medications with him at that appointment.

## 2017-12-01 DIAGNOSIS — N2889 Other specified disorders of kidney and ureter: Secondary | ICD-10-CM | POA: Diagnosis not present

## 2017-12-15 ENCOUNTER — Telehealth: Payer: Self-pay | Admitting: *Deleted

## 2017-12-15 NOTE — Telephone Encounter (Signed)
Left message on patients home phone number that we were unaware of any results of cardiac cath from Dr Ola Spurr or Rob Hickman, and that he should contact Dr Blane Ohara office.  Advised that he could call our office with any other questions or concerns.

## 2017-12-15 NOTE — Telephone Encounter (Signed)
Pt inquiring about test results for Heart Cath with Dr. Ola Spurr at [facility]. Please let pt know if you have heard anything about it.

## 2017-12-18 ENCOUNTER — Telehealth: Payer: Self-pay | Admitting: Cardiology

## 2017-12-18 DIAGNOSIS — N2889 Other specified disorders of kidney and ureter: Secondary | ICD-10-CM | POA: Diagnosis not present

## 2017-12-18 NOTE — Telephone Encounter (Signed)
States he had a cath 5 weeks ago but hasn't seen a doctor yet???

## 2017-12-19 DIAGNOSIS — I517 Cardiomegaly: Secondary | ICD-10-CM | POA: Diagnosis not present

## 2017-12-19 DIAGNOSIS — I4589 Other specified conduction disorders: Secondary | ICD-10-CM | POA: Diagnosis not present

## 2017-12-19 NOTE — Telephone Encounter (Signed)
Spoke with patient's wife, [name], to explain that they need to choose if they are going to follow up with Dr. Ola Spurr or Dr. Bettina Gavia for cardiac care from here on out. [name] states that the patient is having a nephrectomy tomorrow and they would wait until after that is completed to make this decision. Advised her to call our office to schedule an appointment if they wanted to follow up with Dr. Bettina Gavia and see our EP doctors. [name] verbalized understanding. No further questions.

## 2017-12-19 NOTE — Telephone Encounter (Signed)
I do not know why he was not seen by Dr Ola Spurr after his cath. He can either phone his office for FU or I would prefer that he switches to Dr Curt Bears so that his care is in my group practice.  Please ask him which he prefers.

## 2017-12-20 ENCOUNTER — Other Ambulatory Visit: Payer: Self-pay | Admitting: Cardiology

## 2017-12-21 DIAGNOSIS — Z87891 Personal history of nicotine dependence: Secondary | ICD-10-CM | POA: Diagnosis not present

## 2017-12-21 DIAGNOSIS — Z905 Acquired absence of kidney: Secondary | ICD-10-CM | POA: Diagnosis not present

## 2017-12-21 DIAGNOSIS — I130 Hypertensive heart and chronic kidney disease with heart failure and stage 1 through stage 4 chronic kidney disease, or unspecified chronic kidney disease: Secondary | ICD-10-CM | POA: Diagnosis present

## 2017-12-21 DIAGNOSIS — I472 Ventricular tachycardia: Secondary | ICD-10-CM | POA: Diagnosis not present

## 2017-12-21 DIAGNOSIS — I509 Heart failure, unspecified: Secondary | ICD-10-CM | POA: Diagnosis present

## 2017-12-21 DIAGNOSIS — D3001 Benign neoplasm of right kidney: Secondary | ICD-10-CM | POA: Diagnosis not present

## 2017-12-21 DIAGNOSIS — I2510 Atherosclerotic heart disease of native coronary artery without angina pectoris: Secondary | ICD-10-CM | POA: Diagnosis not present

## 2017-12-21 DIAGNOSIS — C641 Malignant neoplasm of right kidney, except renal pelvis: Secondary | ICD-10-CM | POA: Diagnosis present

## 2017-12-21 DIAGNOSIS — Z951 Presence of aortocoronary bypass graft: Secondary | ICD-10-CM | POA: Diagnosis not present

## 2017-12-21 DIAGNOSIS — I255 Ischemic cardiomyopathy: Secondary | ICD-10-CM | POA: Diagnosis not present

## 2017-12-21 DIAGNOSIS — Z95810 Presence of automatic (implantable) cardiac defibrillator: Secondary | ICD-10-CM | POA: Diagnosis not present

## 2017-12-21 DIAGNOSIS — Z8249 Family history of ischemic heart disease and other diseases of the circulatory system: Secondary | ICD-10-CM | POA: Diagnosis not present

## 2017-12-21 DIAGNOSIS — Z803 Family history of malignant neoplasm of breast: Secondary | ICD-10-CM | POA: Diagnosis not present

## 2017-12-21 DIAGNOSIS — E1122 Type 2 diabetes mellitus with diabetic chronic kidney disease: Secondary | ICD-10-CM | POA: Diagnosis present

## 2017-12-21 DIAGNOSIS — N2889 Other specified disorders of kidney and ureter: Secondary | ICD-10-CM | POA: Diagnosis not present

## 2017-12-21 DIAGNOSIS — G8918 Other acute postprocedural pain: Secondary | ICD-10-CM | POA: Diagnosis not present

## 2017-12-21 DIAGNOSIS — E785 Hyperlipidemia, unspecified: Secondary | ICD-10-CM | POA: Diagnosis present

## 2017-12-21 DIAGNOSIS — N189 Chronic kidney disease, unspecified: Secondary | ICD-10-CM | POA: Diagnosis present

## 2017-12-21 DIAGNOSIS — E0590 Thyrotoxicosis, unspecified without thyrotoxic crisis or storm: Secondary | ICD-10-CM | POA: Diagnosis present

## 2018-01-01 ENCOUNTER — Other Ambulatory Visit: Payer: Self-pay | Admitting: Cardiology

## 2018-01-01 DIAGNOSIS — N2889 Other specified disorders of kidney and ureter: Secondary | ICD-10-CM | POA: Diagnosis not present

## 2018-01-03 DIAGNOSIS — I25810 Atherosclerosis of coronary artery bypass graft(s) without angina pectoris: Secondary | ICD-10-CM | POA: Diagnosis not present

## 2018-01-03 DIAGNOSIS — E1121 Type 2 diabetes mellitus with diabetic nephropathy: Secondary | ICD-10-CM | POA: Diagnosis not present

## 2018-01-03 DIAGNOSIS — E039 Hypothyroidism, unspecified: Secondary | ICD-10-CM | POA: Diagnosis not present

## 2018-01-03 DIAGNOSIS — Z79899 Other long term (current) drug therapy: Secondary | ICD-10-CM | POA: Diagnosis not present

## 2018-01-03 DIAGNOSIS — E785 Hyperlipidemia, unspecified: Secondary | ICD-10-CM | POA: Diagnosis not present

## 2018-01-03 DIAGNOSIS — Z6822 Body mass index (BMI) 22.0-22.9, adult: Secondary | ICD-10-CM | POA: Diagnosis not present

## 2018-01-08 DIAGNOSIS — E1022 Type 1 diabetes mellitus with diabetic chronic kidney disease: Secondary | ICD-10-CM | POA: Diagnosis not present

## 2018-01-08 DIAGNOSIS — Z95810 Presence of automatic (implantable) cardiac defibrillator: Secondary | ICD-10-CM | POA: Diagnosis not present

## 2018-01-08 DIAGNOSIS — Z79899 Other long term (current) drug therapy: Secondary | ICD-10-CM | POA: Diagnosis not present

## 2018-01-08 DIAGNOSIS — I5022 Chronic systolic (congestive) heart failure: Secondary | ICD-10-CM | POA: Diagnosis not present

## 2018-01-08 DIAGNOSIS — I129 Hypertensive chronic kidney disease with stage 1 through stage 4 chronic kidney disease, or unspecified chronic kidney disease: Secondary | ICD-10-CM | POA: Diagnosis not present

## 2018-01-08 DIAGNOSIS — N183 Chronic kidney disease, stage 3 (moderate): Secondary | ICD-10-CM | POA: Diagnosis not present

## 2018-01-08 DIAGNOSIS — I472 Ventricular tachycardia: Secondary | ICD-10-CM | POA: Diagnosis not present

## 2018-01-08 DIAGNOSIS — I214 Non-ST elevation (NSTEMI) myocardial infarction: Secondary | ICD-10-CM | POA: Diagnosis not present

## 2018-01-08 DIAGNOSIS — I498 Other specified cardiac arrhythmias: Secondary | ICD-10-CM | POA: Diagnosis not present

## 2018-01-15 DIAGNOSIS — N183 Chronic kidney disease, stage 3 (moderate): Secondary | ICD-10-CM | POA: Diagnosis not present

## 2018-01-17 DIAGNOSIS — M9080 Osteopathy in diseases classified elsewhere, unspecified site: Secondary | ICD-10-CM

## 2018-01-17 DIAGNOSIS — E889 Metabolic disorder, unspecified: Secondary | ICD-10-CM | POA: Diagnosis not present

## 2018-01-17 DIAGNOSIS — E875 Hyperkalemia: Secondary | ICD-10-CM | POA: Diagnosis not present

## 2018-01-17 DIAGNOSIS — E1022 Type 1 diabetes mellitus with diabetic chronic kidney disease: Secondary | ICD-10-CM | POA: Diagnosis not present

## 2018-01-17 DIAGNOSIS — E559 Vitamin D deficiency, unspecified: Secondary | ICD-10-CM

## 2018-01-17 DIAGNOSIS — N183 Chronic kidney disease, stage 3 (moderate): Secondary | ICD-10-CM | POA: Diagnosis not present

## 2018-01-17 DIAGNOSIS — M898X9 Other specified disorders of bone, unspecified site: Secondary | ICD-10-CM

## 2018-01-17 DIAGNOSIS — I129 Hypertensive chronic kidney disease with stage 1 through stage 4 chronic kidney disease, or unspecified chronic kidney disease: Secondary | ICD-10-CM | POA: Diagnosis not present

## 2018-01-17 HISTORY — DX: Vitamin D deficiency, unspecified: E55.9

## 2018-01-17 HISTORY — DX: Other specified disorders of bone, unspecified site: M89.8X9

## 2018-01-17 HISTORY — DX: Metabolic disorder, unspecified: E88.9

## 2018-01-25 DIAGNOSIS — D649 Anemia, unspecified: Secondary | ICD-10-CM | POA: Diagnosis not present

## 2018-01-25 DIAGNOSIS — R748 Abnormal levels of other serum enzymes: Secondary | ICD-10-CM | POA: Diagnosis not present

## 2018-01-29 DIAGNOSIS — D3001 Benign neoplasm of right kidney: Secondary | ICD-10-CM | POA: Insufficient documentation

## 2018-01-29 DIAGNOSIS — Z87891 Personal history of nicotine dependence: Secondary | ICD-10-CM | POA: Diagnosis not present

## 2018-01-29 DIAGNOSIS — Z905 Acquired absence of kidney: Secondary | ICD-10-CM | POA: Diagnosis not present

## 2018-01-29 HISTORY — DX: Benign neoplasm of right kidney: D30.01

## 2018-02-06 NOTE — Progress Notes (Signed)
Cardiology Office Note:    Date:  02/07/2018   ID:  [name], DOB 1950/10/11, MRN [id number]  PCP:  Ernestene Kiel, MD  Cardiologist:  Shirlee More, MD    Referring MD: Ernestene Kiel, MD    ASSESSMENT:    1. Systolic CHF, chronic ([place])   2. Coronary artery disease involving native coronary artery of native heart with angina pectoris ([place])   3. Hypertensive heart disease with chronic combined systolic and diastolic congestive heart failure ([place])   4. Hyperlipidemia, unspecified hyperlipidemia type   5. ICD (implantable cardioverter-defibrillator) in place   6. On amiodarone therapy    PLAN:    In order of problems listed above:  1. Failure stable compensated continue treatment including diuretic beta-blocker ARNI but no longer on MRA.  He has no volume overload New York Heart Association class I to class II 2. Stable CAD having no anginal discomfort class I anatomy and recent coronary angiography 3. Stable continue current treatment 4. Stable continue statin 5. Transition to EP care in my practice 6. To new amiodarone   Next appointment: 3 months   Medication Adjustments/Labs and Tests Ordered: Current medicines are reviewed at length with the patient today.  Concerns regarding medicines are outlined above.  Orders Placed This Encounter  Procedures  . Ambulatory referral to Cardiac Electrophysiology   No orders of the defined types were placed in this encounter.   Chief Complaint  Patient presents with  . Congestive Heart Failure  . Coronary Artery Disease    History of Present Illness:    James [name]. is a 67 y.o. male with a hx of CAD, CHF EF 30%, Atrial Flutter, VT, Dyslipidemia, HTN, S/P CABG in 2008, on amiodarone and ICD last seen 0/01/19.  His ICD has been followed at [facility] cardiology and most recently [facility]. last seen 10/19/17.  In the interim is been seen by EP at the direction of Dr. Ola Spurr he underwent  coronary angiography at [facility] and had no further intervention.  Subsequent has had surgery for renal cancer. Most recent labs 01/25/2018 showed creatinine 1.65 potassium 4.9 ALT is mildly elevated hemoglobin 11.2 GFR 43 cc/min Compliance with diet, lifestyle and medications: Yes  He is slowly recovering from his renal surgery but just feels weak although he is return to [facility] and exercises regularly.  Stable no shortness of breath chest pain palpitation or syncope.  I asked to transition to EP care in my practice I will make the referral.  He has had hyperkalemia after his renal resection and MRI it was discontinued by nephrology.  His last hemoglobin mildly reduced 11.2 GFR 43 cc potassium 4.9 creatinine is plateaued at 1.65  Records reviewed he had an interim coronary angiogram [facility] referred by Dr. Ola Spurr to Dr. Metta Clines and had stable coronary anatomy and no PCI was performed he had no cardiovascular complications of his hospitalization and surgery Past Medical History:  Diagnosis Date  . Atrial arrhythmia 02/15/2014  . CAD in native artery 08/27/2014  . Cardiac dysrhythmia 02/12/2014  . CKD stage 3 due to type 1 diabetes mellitus ([place]) 08/28/2014   Overview:  Cr 1.3 at discharge 08/21/14  . COPD GOLD II 10/17/2014   Followed in Pulmonary clinic/ [facility] [place]  - PFTs   10/17/2014 FEV1  2.09 ( 53%) ratio 62 no sign better p B2 and dlco 63% and corrects to 85%  - 10/17/2014 p extensive coaching HFA  effectiveness =  90%    > try stiolto    . DM (diabetes mellitus) ([place])   . Dyspnea 09/05/2014   Followed in Pulmonary clinic/ [facility]/ [place] - 09/05/2014  Walked RA x 3 laps @ 185 ft each stopped due to end of study, nl pace, no desat   - pfts 10/17/14 dlco 63% on Amiodarone > may benefit from 6 month f/u but defer to Cards    . Essential hypertension 09/11/2014   Changed acei to ARB  09/05/14 due to pseudowheeze> improved 10/17/14    . Heart failure  ([place])   . Hyperlipidemia   . Hypertension   . Hypertensive heart disease with congestive heart failure ([place]) 08/27/2014  . ICD (implantable cardioverter-defibrillator) in place 08/27/2014  . NSTEMI (non-ST elevated myocardial infarction) ([place]) 02/12/2014  . Obesity 12/24/1939   Complicated by dm/ hbp and low erv on pfts 10/17/14    . On amiodarone therapy 08/27/2014  . S/P ICD (internal cardiac defibrillator) procedure 02/15/2014  . Systolic CHF, chronic ([place]) 08/27/2014   Overview:  BNP 1350 at discharge K 3.9 on 08/21/14 Echo EF 35% May 2016  . Typical atrial flutter ([place]) 08/27/2014  . Ventricular tachyarrhythmia ([place]) 08/27/2014    Past Surgical History:  Procedure Laterality Date  . CORONARY ARTERY BYPASS GRAFT  2008  . EYE SURGERY    . ICD IMPLANT    . MOHS SURGERY     Right Arm    Current Medications: Current Meds  Medication Sig  . acetaminophen (TYLENOL) 500 MG tablet Take 500 mg by mouth every 6 (six) hours as needed.  Marland Kitchen amiodarone (PACERONE) 200 MG tablet Take 2 tablets (400 mg total) by mouth daily.  Marland Kitchen atorvastatin (LIPITOR) 40 MG tablet Take 1 tablet (40 mg total) by mouth daily.  . carvedilol (COREG) 25 MG tablet TAKE 1 TABLET BY MOUTH TWICE DAILY WITH MEALS  . Cholecalciferol (VITAMIN D3 PO) Take 800 mg by mouth daily.  . Coenzyme Q10 (CO Q 10 PO) Take 100 mg by mouth daily.  . Cranberry 400 MG CAPS Take 400 mg by mouth 2 (two) times daily.  Marland Kitchen ENTRESTO 97-103 MG TAKE ONE (1) TABLET BY MOUTH TWO (2) TIMES DAILY  . fenofibrate 160 MG tablet TAKE ONE TABLET BY MOUTH DAILY  . furosemide (LASIX) 40 MG tablet Take 1 tablet by mouth 2 (two) times daily.  . insulin detemir (LEVEMIR) 100 UNIT/ML injection Inject 6 Units into the skin daily.   . isosorbide mononitrate (IMDUR) 60 MG 24 hr tablet Take 1 tablet by mouth daily.  Marland Kitchen levothyroxine (SYNTHROID, LEVOTHROID) 88 MCG tablet Take 88 mcg by mouth daily.  Marland Kitchen LORazepam (ATIVAN) 0.5 MG tablet Take 0.5 mg by mouth at bedtime as  needed.  . metFORMIN (GLUCOPHAGE) 1000 MG tablet Take 1,000 mg by mouth 2 (two) times daily with a meal.   . Multiple Vitamin (MULTIVITAMIN) capsule Take 1 capsule by mouth daily.  . nitroGLYCERIN (NITROSTAT) 0.4 MG SL tablet Place 0.4 mg under the tongue every 5 (five) minutes as needed for chest pain.  . Omega-3 Fatty Acids (FISH OIL) 1000 MG CAPS Take 1 capsule by mouth 2 (two) times daily.   . ranolazine (RANEXA) 500 MG 12 hr tablet Take 1 tablet (500 mg total) by mouth 2 (two) times daily. (Patient taking differently: Take 250 mg by mouth 2 (two) times daily. Takes 250 mg one tablet twice daily)  . Zinc 50 MG TABS Take 1 tablet by mouth daily.  Allergies:   Codeine   Social History   Socioeconomic History  . Marital status: Married    Spouse name: Not on file  . Number of children: Not on file  . Years of education: Not on file  . Highest education level: Not on file  Occupational History  . Occupation: Press photographer   Social Needs  . Financial resource strain: Not on file  . Food insecurity:    Worry: Not on file    Inability: Not on file  . Transportation needs:    Medical: Not on file    Non-medical: Not on file  Tobacco Use  . Smoking status: Former Smoker    Packs/day: 1.00    Years: 38.00    Pack years: 38.00    Types: Cigarettes    Last attempt to quit: 04/04/2006    Years since quitting: 11.8  . Smokeless tobacco: Never Used  Substance and Sexual Activity  . Alcohol use: No    Alcohol/week: 0.0 standard drinks  . Drug use: No  . Sexual activity: Not on file  Lifestyle  . Physical activity:    Days per week: Not on file    Minutes per session: Not on file  . Stress: Not on file  Relationships  . Social connections:    Talks on phone: Not on file    Gets together: Not on file    Attends religious service: Not on file    Active member of club or organization: Not on file    Attends meetings of clubs or organizations: Not on file    Relationship status: Not on  file  Other Topics Concern  . Not on file  Social History Narrative  . Not on file     Family History: The patient's family history includes Breast cancer in his mother; Cancer in his daughter; Heart disease in his father and mother. ROS:   Please see the history of present illness.    All other systems reviewed and are negative.  EKGs/Labs/Other Studies Reviewed:    The following studies were reviewed today:   Recent Labs:  01/25/18 Cr 1.65 K 4.9 Cr 43 cc/min  Hgb 11.2   Physical Exam:    VS:  BP (!) 152/72 (BP Location: Left Arm, Patient Position: Sitting, Cuff Size: Large)   Pulse 61   Ht 5\' 8"  (1.727 m)   Wt 225 lb 12.8 oz (102.4 kg)   SpO2 97%   BMI 34.33 kg/m     Wt Readings from Last 3 Encounters:  02/07/18 225 lb 12.8 oz (102.4 kg)  10/19/17 218 lb (98.9 kg)  07/03/17 223 lb 6.4 oz (101.3 kg)     GEN:  Well nourished, well developed in no acute distress HEENT: Normal NECK: No JVD; No carotid bruits LYMPHATICS: No lymphadenopathy CARDIAC: RRR, no murmurs, rubs, gallops RESPIRATORY:  Clear to auscultation without rales, wheezing or rhonchi  ABDOMEN: Soft, non-tender, non-distended MUSCULOSKELETAL:  No edema; No deformity  SKIN: Warm and dry NEUROLOGIC:  Alert and oriented x 3 PSYCHIATRIC:  Normal affect    Signed, Shirlee More, MD  02/07/2018 9:24 AM    [place]

## 2018-02-07 ENCOUNTER — Ambulatory Visit (INDEPENDENT_AMBULATORY_CARE_PROVIDER_SITE_OTHER): Payer: Medicare Other | Admitting: Cardiology

## 2018-02-07 ENCOUNTER — Encounter: Payer: Self-pay | Admitting: Cardiology

## 2018-02-07 VITALS — BP 152/72 | HR 61 | Ht 68.0 in | Wt 225.8 lb

## 2018-02-07 DIAGNOSIS — I110 Hypertensive heart disease with heart failure: Secondary | ICD-10-CM

## 2018-02-07 DIAGNOSIS — Z95810 Presence of automatic (implantable) cardiac defibrillator: Secondary | ICD-10-CM | POA: Diagnosis not present

## 2018-02-07 DIAGNOSIS — Z79899 Other long term (current) drug therapy: Secondary | ICD-10-CM | POA: Diagnosis not present

## 2018-02-07 DIAGNOSIS — I25119 Atherosclerotic heart disease of native coronary artery with unspecified angina pectoris: Secondary | ICD-10-CM | POA: Diagnosis not present

## 2018-02-07 DIAGNOSIS — I5022 Chronic systolic (congestive) heart failure: Secondary | ICD-10-CM

## 2018-02-07 DIAGNOSIS — E785 Hyperlipidemia, unspecified: Secondary | ICD-10-CM | POA: Diagnosis not present

## 2018-02-07 DIAGNOSIS — I5042 Chronic combined systolic (congestive) and diastolic (congestive) heart failure: Secondary | ICD-10-CM

## 2018-02-07 MED ORDER — NITROGLYCERIN 0.4 MG SL SUBL: 0.4000 mg | SUBLINGUAL_TABLET | SUBLINGUAL | 11 refills | Status: DC | PRN

## 2018-02-07 NOTE — Patient Instructions (Signed)
Medication Instructions:  Your physician recommends that you continue on your current medications as directed. Please refer to the Current Medication list given to you today.  If you need a refill on your cardiac medications before your next appointment, please call your pharmacy.   Lab work: None  If you have labs (blood work) drawn today and your tests are completely normal, you will receive your results only by: Marland Kitchen MyChart Message (if you have MyChart) OR . A paper copy in the mail If you have any lab test that is abnormal or we need to change your treatment, we will call you to review the results.  Testing/Procedures: You have been referred to see an electrophysiologist, Dr. Curt Bears, for ICD follow up. You will be scheduled for an appointment at the check out desk.   Follow-Up: At [facility], you and your health needs are our priority.  As part of our continuing mission to provide you with exceptional heart care, we have created designated Provider Care Teams.  These Care Teams include your primary Cardiologist (physician) and Advanced Practice Providers (APPs -  Physician Assistants and Nurse Practitioners) who all work together to provide you with the care you need, when you need it. You will need a follow up appointment in 3 months.

## 2018-02-07 NOTE — Addendum Note (Signed)
Addended by: Austin Miles on: 02/07/2018 11:28 AM   Modules accepted: Orders

## 2018-02-19 ENCOUNTER — Other Ambulatory Visit: Payer: Self-pay | Admitting: Cardiology

## 2018-02-19 DIAGNOSIS — Z1339 Encounter for screening examination for other mental health and behavioral disorders: Secondary | ICD-10-CM | POA: Diagnosis not present

## 2018-02-19 DIAGNOSIS — R5381 Other malaise: Secondary | ICD-10-CM | POA: Diagnosis not present

## 2018-02-19 DIAGNOSIS — Z6833 Body mass index (BMI) 33.0-33.9, adult: Secondary | ICD-10-CM | POA: Diagnosis not present

## 2018-02-19 DIAGNOSIS — I5022 Chronic systolic (congestive) heart failure: Secondary | ICD-10-CM | POA: Diagnosis not present

## 2018-02-19 DIAGNOSIS — E669 Obesity, unspecified: Secondary | ICD-10-CM | POA: Diagnosis not present

## 2018-02-19 DIAGNOSIS — Z0001 Encounter for general adult medical examination with abnormal findings: Secondary | ICD-10-CM | POA: Diagnosis not present

## 2018-02-19 DIAGNOSIS — Z1331 Encounter for screening for depression: Secondary | ICD-10-CM | POA: Diagnosis not present

## 2018-03-13 DIAGNOSIS — Z95810 Presence of automatic (implantable) cardiac defibrillator: Secondary | ICD-10-CM | POA: Diagnosis not present

## 2018-03-13 DIAGNOSIS — I110 Hypertensive heart disease with heart failure: Secondary | ICD-10-CM | POA: Diagnosis not present

## 2018-03-13 DIAGNOSIS — E119 Type 2 diabetes mellitus without complications: Secondary | ICD-10-CM | POA: Diagnosis not present

## 2018-03-13 DIAGNOSIS — E785 Hyperlipidemia, unspecified: Secondary | ICD-10-CM | POA: Diagnosis not present

## 2018-03-13 DIAGNOSIS — I5022 Chronic systolic (congestive) heart failure: Secondary | ICD-10-CM | POA: Diagnosis not present

## 2018-03-13 DIAGNOSIS — Z955 Presence of coronary angioplasty implant and graft: Secondary | ICD-10-CM | POA: Diagnosis not present

## 2018-03-13 DIAGNOSIS — Z79899 Other long term (current) drug therapy: Secondary | ICD-10-CM | POA: Diagnosis not present

## 2018-03-13 DIAGNOSIS — I4891 Unspecified atrial fibrillation: Secondary | ICD-10-CM | POA: Diagnosis not present

## 2018-03-13 DIAGNOSIS — I252 Old myocardial infarction: Secondary | ICD-10-CM | POA: Diagnosis not present

## 2018-03-14 DIAGNOSIS — E785 Hyperlipidemia, unspecified: Secondary | ICD-10-CM | POA: Diagnosis not present

## 2018-03-14 DIAGNOSIS — I4891 Unspecified atrial fibrillation: Secondary | ICD-10-CM | POA: Diagnosis not present

## 2018-03-14 DIAGNOSIS — I5022 Chronic systolic (congestive) heart failure: Secondary | ICD-10-CM | POA: Diagnosis not present

## 2018-03-14 DIAGNOSIS — I110 Hypertensive heart disease with heart failure: Secondary | ICD-10-CM | POA: Diagnosis not present

## 2018-03-14 DIAGNOSIS — I252 Old myocardial infarction: Secondary | ICD-10-CM | POA: Diagnosis not present

## 2018-03-14 DIAGNOSIS — E119 Type 2 diabetes mellitus without complications: Secondary | ICD-10-CM | POA: Diagnosis not present

## 2018-03-16 DIAGNOSIS — E119 Type 2 diabetes mellitus without complications: Secondary | ICD-10-CM | POA: Diagnosis not present

## 2018-03-16 DIAGNOSIS — E785 Hyperlipidemia, unspecified: Secondary | ICD-10-CM | POA: Diagnosis not present

## 2018-03-16 DIAGNOSIS — I5022 Chronic systolic (congestive) heart failure: Secondary | ICD-10-CM | POA: Diagnosis not present

## 2018-03-16 DIAGNOSIS — I252 Old myocardial infarction: Secondary | ICD-10-CM | POA: Diagnosis not present

## 2018-03-16 DIAGNOSIS — I110 Hypertensive heart disease with heart failure: Secondary | ICD-10-CM | POA: Diagnosis not present

## 2018-03-16 DIAGNOSIS — Z4502 Encounter for adjustment and management of automatic implantable cardiac defibrillator: Secondary | ICD-10-CM | POA: Diagnosis not present

## 2018-03-16 DIAGNOSIS — I4891 Unspecified atrial fibrillation: Secondary | ICD-10-CM | POA: Diagnosis not present

## 2018-03-19 DIAGNOSIS — E785 Hyperlipidemia, unspecified: Secondary | ICD-10-CM | POA: Diagnosis not present

## 2018-03-19 DIAGNOSIS — I110 Hypertensive heart disease with heart failure: Secondary | ICD-10-CM | POA: Diagnosis not present

## 2018-03-19 DIAGNOSIS — I5022 Chronic systolic (congestive) heart failure: Secondary | ICD-10-CM | POA: Diagnosis not present

## 2018-03-19 DIAGNOSIS — I4891 Unspecified atrial fibrillation: Secondary | ICD-10-CM | POA: Diagnosis not present

## 2018-03-19 DIAGNOSIS — E119 Type 2 diabetes mellitus without complications: Secondary | ICD-10-CM | POA: Diagnosis not present

## 2018-03-19 DIAGNOSIS — I252 Old myocardial infarction: Secondary | ICD-10-CM | POA: Diagnosis not present

## 2018-03-21 DIAGNOSIS — I252 Old myocardial infarction: Secondary | ICD-10-CM | POA: Diagnosis not present

## 2018-03-21 DIAGNOSIS — I110 Hypertensive heart disease with heart failure: Secondary | ICD-10-CM | POA: Diagnosis not present

## 2018-03-21 DIAGNOSIS — E785 Hyperlipidemia, unspecified: Secondary | ICD-10-CM | POA: Diagnosis not present

## 2018-03-21 DIAGNOSIS — E1022 Type 1 diabetes mellitus with diabetic chronic kidney disease: Secondary | ICD-10-CM | POA: Diagnosis not present

## 2018-03-21 DIAGNOSIS — N183 Chronic kidney disease, stage 3 (moderate): Secondary | ICD-10-CM | POA: Diagnosis not present

## 2018-03-21 DIAGNOSIS — I4891 Unspecified atrial fibrillation: Secondary | ICD-10-CM | POA: Diagnosis not present

## 2018-03-21 DIAGNOSIS — I5022 Chronic systolic (congestive) heart failure: Secondary | ICD-10-CM | POA: Diagnosis not present

## 2018-03-21 DIAGNOSIS — E119 Type 2 diabetes mellitus without complications: Secondary | ICD-10-CM | POA: Diagnosis not present

## 2018-03-23 DIAGNOSIS — I252 Old myocardial infarction: Secondary | ICD-10-CM | POA: Diagnosis not present

## 2018-03-23 DIAGNOSIS — I5022 Chronic systolic (congestive) heart failure: Secondary | ICD-10-CM | POA: Diagnosis not present

## 2018-03-23 DIAGNOSIS — I4891 Unspecified atrial fibrillation: Secondary | ICD-10-CM | POA: Diagnosis not present

## 2018-03-23 DIAGNOSIS — E785 Hyperlipidemia, unspecified: Secondary | ICD-10-CM | POA: Diagnosis not present

## 2018-03-23 DIAGNOSIS — I110 Hypertensive heart disease with heart failure: Secondary | ICD-10-CM | POA: Diagnosis not present

## 2018-03-23 DIAGNOSIS — E119 Type 2 diabetes mellitus without complications: Secondary | ICD-10-CM | POA: Diagnosis not present

## 2018-03-26 DIAGNOSIS — E785 Hyperlipidemia, unspecified: Secondary | ICD-10-CM | POA: Diagnosis not present

## 2018-03-26 DIAGNOSIS — I110 Hypertensive heart disease with heart failure: Secondary | ICD-10-CM | POA: Diagnosis not present

## 2018-03-26 DIAGNOSIS — M9080 Osteopathy in diseases classified elsewhere, unspecified site: Secondary | ICD-10-CM | POA: Diagnosis not present

## 2018-03-26 DIAGNOSIS — E559 Vitamin D deficiency, unspecified: Secondary | ICD-10-CM | POA: Diagnosis not present

## 2018-03-26 DIAGNOSIS — I129 Hypertensive chronic kidney disease with stage 1 through stage 4 chronic kidney disease, or unspecified chronic kidney disease: Secondary | ICD-10-CM | POA: Diagnosis not present

## 2018-03-26 DIAGNOSIS — I5022 Chronic systolic (congestive) heart failure: Secondary | ICD-10-CM | POA: Diagnosis not present

## 2018-03-26 DIAGNOSIS — N183 Chronic kidney disease, stage 3 (moderate): Secondary | ICD-10-CM | POA: Diagnosis not present

## 2018-03-26 DIAGNOSIS — E119 Type 2 diabetes mellitus without complications: Secondary | ICD-10-CM | POA: Diagnosis not present

## 2018-03-26 DIAGNOSIS — E875 Hyperkalemia: Secondary | ICD-10-CM | POA: Diagnosis not present

## 2018-03-26 DIAGNOSIS — E1022 Type 1 diabetes mellitus with diabetic chronic kidney disease: Secondary | ICD-10-CM | POA: Diagnosis not present

## 2018-03-26 DIAGNOSIS — I252 Old myocardial infarction: Secondary | ICD-10-CM | POA: Diagnosis not present

## 2018-03-26 DIAGNOSIS — I4891 Unspecified atrial fibrillation: Secondary | ICD-10-CM | POA: Diagnosis not present

## 2018-03-26 DIAGNOSIS — E889 Metabolic disorder, unspecified: Secondary | ICD-10-CM | POA: Diagnosis not present

## 2018-03-30 ENCOUNTER — Other Ambulatory Visit: Payer: Self-pay | Admitting: Cardiology

## 2018-03-30 DIAGNOSIS — E119 Type 2 diabetes mellitus without complications: Secondary | ICD-10-CM | POA: Diagnosis not present

## 2018-03-30 DIAGNOSIS — I252 Old myocardial infarction: Secondary | ICD-10-CM | POA: Diagnosis not present

## 2018-03-30 DIAGNOSIS — I110 Hypertensive heart disease with heart failure: Secondary | ICD-10-CM | POA: Diagnosis not present

## 2018-03-30 DIAGNOSIS — I5022 Chronic systolic (congestive) heart failure: Secondary | ICD-10-CM | POA: Diagnosis not present

## 2018-03-30 DIAGNOSIS — E785 Hyperlipidemia, unspecified: Secondary | ICD-10-CM | POA: Diagnosis not present

## 2018-03-30 DIAGNOSIS — I4891 Unspecified atrial fibrillation: Secondary | ICD-10-CM | POA: Diagnosis not present

## 2018-04-02 DIAGNOSIS — I4891 Unspecified atrial fibrillation: Secondary | ICD-10-CM | POA: Diagnosis not present

## 2018-04-02 DIAGNOSIS — E785 Hyperlipidemia, unspecified: Secondary | ICD-10-CM | POA: Diagnosis not present

## 2018-04-02 DIAGNOSIS — E119 Type 2 diabetes mellitus without complications: Secondary | ICD-10-CM | POA: Diagnosis not present

## 2018-04-02 DIAGNOSIS — I5022 Chronic systolic (congestive) heart failure: Secondary | ICD-10-CM | POA: Diagnosis not present

## 2018-04-02 DIAGNOSIS — I252 Old myocardial infarction: Secondary | ICD-10-CM | POA: Diagnosis not present

## 2018-04-02 DIAGNOSIS — I110 Hypertensive heart disease with heart failure: Secondary | ICD-10-CM | POA: Diagnosis not present

## 2018-04-03 DIAGNOSIS — I110 Hypertensive heart disease with heart failure: Secondary | ICD-10-CM | POA: Diagnosis not present

## 2018-04-03 DIAGNOSIS — I252 Old myocardial infarction: Secondary | ICD-10-CM | POA: Diagnosis not present

## 2018-04-03 DIAGNOSIS — I4891 Unspecified atrial fibrillation: Secondary | ICD-10-CM | POA: Diagnosis not present

## 2018-04-03 DIAGNOSIS — I5022 Chronic systolic (congestive) heart failure: Secondary | ICD-10-CM | POA: Diagnosis not present

## 2018-04-03 DIAGNOSIS — E119 Type 2 diabetes mellitus without complications: Secondary | ICD-10-CM | POA: Diagnosis not present

## 2018-04-03 DIAGNOSIS — E785 Hyperlipidemia, unspecified: Secondary | ICD-10-CM | POA: Diagnosis not present

## 2018-04-06 DIAGNOSIS — I5022 Chronic systolic (congestive) heart failure: Secondary | ICD-10-CM | POA: Diagnosis not present

## 2018-04-06 DIAGNOSIS — R5381 Other malaise: Secondary | ICD-10-CM | POA: Diagnosis not present

## 2018-04-09 DIAGNOSIS — R5381 Other malaise: Secondary | ICD-10-CM | POA: Diagnosis not present

## 2018-04-09 DIAGNOSIS — I5022 Chronic systolic (congestive) heart failure: Secondary | ICD-10-CM | POA: Diagnosis not present

## 2018-04-10 DIAGNOSIS — C44629 Squamous cell carcinoma of skin of left upper limb, including shoulder: Secondary | ICD-10-CM | POA: Diagnosis not present

## 2018-04-10 DIAGNOSIS — L821 Other seborrheic keratosis: Secondary | ICD-10-CM | POA: Diagnosis not present

## 2018-04-10 DIAGNOSIS — D225 Melanocytic nevi of trunk: Secondary | ICD-10-CM | POA: Diagnosis not present

## 2018-04-10 DIAGNOSIS — L814 Other melanin hyperpigmentation: Secondary | ICD-10-CM | POA: Diagnosis not present

## 2018-04-10 DIAGNOSIS — C4441 Basal cell carcinoma of skin of scalp and neck: Secondary | ICD-10-CM | POA: Diagnosis not present

## 2018-04-10 DIAGNOSIS — D485 Neoplasm of uncertain behavior of skin: Secondary | ICD-10-CM | POA: Diagnosis not present

## 2018-04-10 DIAGNOSIS — L570 Actinic keratosis: Secondary | ICD-10-CM | POA: Diagnosis not present

## 2018-04-10 DIAGNOSIS — D2239 Melanocytic nevi of other parts of face: Secondary | ICD-10-CM | POA: Diagnosis not present

## 2018-04-11 ENCOUNTER — Other Ambulatory Visit: Payer: Self-pay | Admitting: *Deleted

## 2018-04-11 ENCOUNTER — Encounter: Payer: Self-pay | Admitting: *Deleted

## 2018-04-11 DIAGNOSIS — I129 Hypertensive chronic kidney disease with stage 1 through stage 4 chronic kidney disease, or unspecified chronic kidney disease: Secondary | ICD-10-CM | POA: Insufficient documentation

## 2018-04-11 DIAGNOSIS — N183 Chronic kidney disease, stage 3 unspecified: Secondary | ICD-10-CM | POA: Insufficient documentation

## 2018-04-11 HISTORY — DX: Hypertensive chronic kidney disease with stage 1 through stage 4 chronic kidney disease, or unspecified chronic kidney disease: I12.9

## 2018-04-11 HISTORY — DX: Chronic kidney disease, stage 3 unspecified: N18.30

## 2018-04-13 DIAGNOSIS — I5022 Chronic systolic (congestive) heart failure: Secondary | ICD-10-CM | POA: Diagnosis not present

## 2018-04-13 DIAGNOSIS — R5381 Other malaise: Secondary | ICD-10-CM | POA: Diagnosis not present

## 2018-04-16 ENCOUNTER — Other Ambulatory Visit: Payer: Self-pay | Admitting: Cardiology

## 2018-04-16 DIAGNOSIS — R5381 Other malaise: Secondary | ICD-10-CM | POA: Diagnosis not present

## 2018-04-16 DIAGNOSIS — I5022 Chronic systolic (congestive) heart failure: Secondary | ICD-10-CM | POA: Diagnosis not present

## 2018-04-16 MED ORDER — RANOLAZINE ER 500 MG PO TB12: 250.0000 mg | ORAL_TABLET | Freq: Two times a day (BID) | ORAL | 0 refills | Status: DC

## 2018-04-16 NOTE — Addendum Note (Signed)
Addended by: Stevan Born on: 04/16/2018 11:24 AM   Modules accepted: Orders

## 2018-04-16 NOTE — Telephone Encounter (Signed)
ca *STAT* If patient is at the pharmacy, call can be transferred to refill team.   1. Which medications need to be refilled? (please list name of each medication and dose if known) Ranexa  2. Which pharmacy/location (including street and city if local pharmacy) is medication to be sent to?Needs to be called to the [place]   3. Do they need a 30 day or 90 day supply? 90   Her insurance will cover [facility] an not [facility].

## 2018-04-16 NOTE — Telephone Encounter (Signed)
Rx sent to [facility] as requested.

## 2018-04-18 DIAGNOSIS — Z79899 Other long term (current) drug therapy: Secondary | ICD-10-CM | POA: Diagnosis not present

## 2018-04-18 DIAGNOSIS — E1121 Type 2 diabetes mellitus with diabetic nephropathy: Secondary | ICD-10-CM | POA: Diagnosis not present

## 2018-04-18 DIAGNOSIS — Z6833 Body mass index (BMI) 33.0-33.9, adult: Secondary | ICD-10-CM | POA: Diagnosis not present

## 2018-04-18 DIAGNOSIS — E6609 Other obesity due to excess calories: Secondary | ICD-10-CM | POA: Diagnosis not present

## 2018-04-18 DIAGNOSIS — R5381 Other malaise: Secondary | ICD-10-CM | POA: Diagnosis not present

## 2018-04-18 DIAGNOSIS — E039 Hypothyroidism, unspecified: Secondary | ICD-10-CM | POA: Diagnosis not present

## 2018-04-18 DIAGNOSIS — I25810 Atherosclerosis of coronary artery bypass graft(s) without angina pectoris: Secondary | ICD-10-CM | POA: Diagnosis not present

## 2018-04-18 DIAGNOSIS — I110 Hypertensive heart disease with heart failure: Secondary | ICD-10-CM | POA: Diagnosis not present

## 2018-04-18 DIAGNOSIS — I5022 Chronic systolic (congestive) heart failure: Secondary | ICD-10-CM | POA: Diagnosis not present

## 2018-04-20 DIAGNOSIS — R5381 Other malaise: Secondary | ICD-10-CM | POA: Diagnosis not present

## 2018-04-20 DIAGNOSIS — I5022 Chronic systolic (congestive) heart failure: Secondary | ICD-10-CM | POA: Diagnosis not present

## 2018-04-23 ENCOUNTER — Encounter: Payer: Medicare Other | Admitting: Cardiology

## 2018-04-23 DIAGNOSIS — I5022 Chronic systolic (congestive) heart failure: Secondary | ICD-10-CM | POA: Diagnosis not present

## 2018-04-23 DIAGNOSIS — R5381 Other malaise: Secondary | ICD-10-CM | POA: Diagnosis not present

## 2018-04-25 DIAGNOSIS — I5022 Chronic systolic (congestive) heart failure: Secondary | ICD-10-CM | POA: Diagnosis not present

## 2018-04-25 DIAGNOSIS — R5381 Other malaise: Secondary | ICD-10-CM | POA: Diagnosis not present

## 2018-04-26 ENCOUNTER — Other Ambulatory Visit: Payer: Self-pay | Admitting: Cardiology

## 2018-04-27 DIAGNOSIS — J029 Acute pharyngitis, unspecified: Secondary | ICD-10-CM | POA: Diagnosis not present

## 2018-04-27 DIAGNOSIS — J310 Chronic rhinitis: Secondary | ICD-10-CM | POA: Diagnosis not present

## 2018-04-27 DIAGNOSIS — R0982 Postnasal drip: Secondary | ICD-10-CM | POA: Diagnosis not present

## 2018-04-27 DIAGNOSIS — E669 Obesity, unspecified: Secondary | ICD-10-CM | POA: Diagnosis not present

## 2018-04-27 DIAGNOSIS — Z6833 Body mass index (BMI) 33.0-33.9, adult: Secondary | ICD-10-CM | POA: Diagnosis not present

## 2018-04-30 DIAGNOSIS — I5022 Chronic systolic (congestive) heart failure: Secondary | ICD-10-CM | POA: Diagnosis not present

## 2018-04-30 DIAGNOSIS — R5381 Other malaise: Secondary | ICD-10-CM | POA: Diagnosis not present

## 2018-05-02 DIAGNOSIS — I5022 Chronic systolic (congestive) heart failure: Secondary | ICD-10-CM | POA: Diagnosis not present

## 2018-05-02 DIAGNOSIS — R5381 Other malaise: Secondary | ICD-10-CM | POA: Diagnosis not present

## 2018-05-03 DIAGNOSIS — D3001 Benign neoplasm of right kidney: Secondary | ICD-10-CM | POA: Diagnosis not present

## 2018-05-03 DIAGNOSIS — D0462 Carcinoma in situ of skin of left upper limb, including shoulder: Secondary | ICD-10-CM | POA: Diagnosis not present

## 2018-05-03 DIAGNOSIS — N2889 Other specified disorders of kidney and ureter: Secondary | ICD-10-CM | POA: Diagnosis not present

## 2018-05-04 DIAGNOSIS — R5381 Other malaise: Secondary | ICD-10-CM | POA: Diagnosis not present

## 2018-05-04 DIAGNOSIS — I5022 Chronic systolic (congestive) heart failure: Secondary | ICD-10-CM | POA: Diagnosis not present

## 2018-05-07 DIAGNOSIS — R5381 Other malaise: Secondary | ICD-10-CM | POA: Diagnosis not present

## 2018-05-07 DIAGNOSIS — I5022 Chronic systolic (congestive) heart failure: Secondary | ICD-10-CM | POA: Diagnosis not present

## 2018-05-09 DIAGNOSIS — R5381 Other malaise: Secondary | ICD-10-CM | POA: Diagnosis not present

## 2018-05-09 DIAGNOSIS — I5022 Chronic systolic (congestive) heart failure: Secondary | ICD-10-CM | POA: Diagnosis not present

## 2018-05-09 NOTE — Progress Notes (Signed)
Cardiology Office Note:    Date:  05/10/2018   ID:  James Rutter., DOB Jan 25, 1951, MRN [id number]  PCP:  Ernestene Kiel, MD  Cardiologist:  Shirlee More, MD    Referring MD: Ernestene Kiel, MD    ASSESSMENT:    1. Chronic systolic congestive heart failure ([place])   2. Hypertensive heart disease with chronic combined systolic and diastolic congestive heart failure ([place])   3. Coronary artery disease involving native coronary artery of native heart with angina pectoris ([employer])   4. Benign hypertension with chronic kidney disease, stage III ([place])   5. Hyperlipidemia, unspecified hyperlipidemia type   6. On amiodarone therapy   7. ICD (implantable cardioverter-defibrillator) in place    PLAN:    In order of problems listed above:  1. His heart failure stable compensated New York Heart Association class I he will continue treatment including his loop diuretic beta-blocker and Entresto no longer on potassium supplements and is not on distal diuretic with his CKD and hyperlipidemia. 2. Stable see above continue same treatment 3. Stable CAD continue medical therapy 4. Stable CKD managed by nephrology 5. Stable continue his high intensity statin lipids are at target 6. Continue amiodarone 7. Managed by [facility] EP   Next appointment: 3 months   Medication Adjustments/Labs and Tests Ordered: Current medicines are reviewed at length with the patient today.  Concerns regarding medicines are outlined above.  No orders of the defined types were placed in this encounter.  No orders of the defined types were placed in this encounter.   Chief Complaint  Patient presents with  . Follow-up  . Congestive Heart Failure  . Coronary Artery Disease    History of Present Illness:    James Plessinger. is a 68 y.o. male with a hx of CAD, CHF EF 30%, Atrial Flutter, VT, Dyslipidemia, HTN, S/P CABG in 2008, on amiodarone and ICD  last seen 02/07/18. Compliance with diet,  lifestyle and medications: Yes  James [name] is slowly and steadily improving but is not back to his previous functional level before nephrectomy.  His device continues to be followed at [facility] electrophysiology he has had no chest pain edema shortness of breath palpitations syncope or ICD discharge.  Recent labs from his primary care physician show cholesterol 165 HDL 42 creatinine 1.65 nephrology is seen with his CKD and stop potassium supplements. Past Medical History:  Diagnosis Date  . Atrial arrhythmia 02/15/2014  . CAD in native artery 08/27/2014  . Cardiac dysrhythmia 02/12/2014  . CKD stage 3 due to type 1 diabetes mellitus ([place]) 08/28/2014   Overview:  Cr 1.3 at discharge 08/21/14  . COPD GOLD II 10/17/2014   Followed in Pulmonary clinic/ [facility]/ [place]  - PFTs   10/17/2014 FEV1  2.09 ( 53%) ratio 62 no sign better p B2 and dlco 63% and corrects to 85%  - 10/17/2014 p extensive coaching HFA effectiveness =  90%    > try stiolto    . DM (diabetes mellitus) ([place])   . Dyspnea 09/05/2014   Followed in Pulmonary clinic/ [facility]/ [place] - 09/05/2014  Walked RA x 3 laps @ 185 ft each stopped due to end of study, nl pace, no desat   - pfts 10/17/14 dlco 63% on Amiodarone > may benefit from 6 month f/u but defer to Cards    . Essential hypertension 09/11/2014   Changed acei to ARB  09/05/14 due to pseudowheeze> improved 10/17/14    .  Heart failure ([place])   . Hyperlipidemia   . Hypertension   . Hypertensive heart disease with congestive heart failure ([place]) 08/27/2014  . ICD (implantable cardioverter-defibrillator) in place 08/27/2014  . NSTEMI (non-ST elevated myocardial infarction) ([place]) 02/12/2014  . Obesity 2/95/6213   Complicated by dm/ hbp and low erv on pfts 10/17/14    . On amiodarone therapy 08/27/2014  . S/P ICD (internal cardiac defibrillator) procedure 02/15/2014  . Systolic CHF, chronic ([place]) 08/27/2014   Overview:  BNP 1350 at discharge K 3.9 on 08/21/14 Echo EF 35% May  2016  . Typical atrial flutter ([place]) 08/27/2014  . Ventricular tachyarrhythmia ([place]) 08/27/2014    Past Surgical History:  Procedure Laterality Date  . CORONARY ARTERY BYPASS GRAFT  2008  . EYE SURGERY    . ICD IMPLANT    . MOHS SURGERY     Right Arm  . NEPHRECTOMY Right     Current Medications: Current Meds  Medication Sig  . acetaminophen (TYLENOL) 500 MG tablet Take 500 mg by mouth every 6 (six) hours as needed.  Marland Kitchen amiodarone (PACERONE) 200 MG tablet Take 2 tablets (400 mg total) by mouth daily.  . Ascorbic Acid (VITAMIN C) 100 MG tablet Take 100 mg by mouth 2 (two) times daily.  Marland Kitchen atorvastatin (LIPITOR) 40 MG tablet Take 40 mg by mouth daily.  . carvedilol (COREG) 25 MG tablet TAKE 1 TABLET BY MOUTH TWICE DAILY WITH MEALS  . Cholecalciferol (VITAMIN D3 PO) Take 400 mg by mouth daily.   . Coenzyme Q10 (CO Q 10 PO) Take 100 mg by mouth daily.  . Cranberry 400 MG CAPS Take 400 mg by mouth 2 (two) times daily.  Marland Kitchen ENTRESTO 97-103 MG TAKE 1 TABLET BY MOUTH TWICE DAILY.  . furosemide (LASIX) 40 MG tablet Take 1 tablet (40 mg total) by mouth 2 (two) times daily.  . Garlic 086 MG TABS Take 500 mg by mouth daily.  . insulin detemir (LEVEMIR) 100 UNIT/ML injection Inject 6 Units into the skin daily.   . isosorbide mononitrate (IMDUR) 60 MG 24 hr tablet Take 1 tablet by mouth daily.  Marland Kitchen levothyroxine (SYNTHROID, LEVOTHROID) 88 MCG tablet Take 88 mcg by mouth daily.  Marland Kitchen LORazepam (ATIVAN) 0.5 MG tablet Take 0.5 mg by mouth at bedtime as needed.  . metFORMIN (GLUCOPHAGE) 1000 MG tablet Take 500 mg by mouth 2 (two) times daily with a meal.   . Multiple Vitamin (MULTIVITAMIN) capsule Take 1 capsule by mouth daily.  . nitroGLYCERIN (NITROSTAT) 0.4 MG SL tablet Place 1 tablet (0.4 mg total) under the tongue every 5 (five) minutes as needed for chest pain.  . Omega-3 Fatty Acids (FISH OIL) 1000 MG CAPS Take 1 capsule by mouth 2 (two) times daily.   . ranolazine (RANEXA) 500 MG 12 hr tablet Take 250  mg by mouth 2 (two) times daily.  . Zinc 50 MG TABS Take 1 tablet by mouth daily.  . [DISCONTINUED] atorvastatin (LIPITOR) 40 MG tablet Take 1 tablet (40 mg total) by mouth daily.     Allergies:   Codeine   Social History   Socioeconomic History  . Marital status: Married    Spouse name: Not on file  . Number of children: Not on file  . Years of education: Not on file  . Highest education level: Not on file  Occupational History  . Occupation: Press photographer   Social Needs  . Financial resource strain: Not on file  . Food insecurity:    Worry:  Not on file    Inability: Not on file  . Transportation needs:    Medical: Not on file    Non-medical: Not on file  Tobacco Use  . Smoking status: Former Smoker    Packs/day: 1.00    Years: 38.00    Pack years: 38.00    Types: Cigarettes    Last attempt to quit: 04/04/2006    Years since quitting: 12.1  . Smokeless tobacco: Never Used  Substance and Sexual Activity  . Alcohol use: No    Alcohol/week: 0.0 standard drinks  . Drug use: No  . Sexual activity: Not on file  Lifestyle  . Physical activity:    Days per week: Not on file    Minutes per session: Not on file  . Stress: Not on file  Relationships  . Social connections:    Talks on phone: Not on file    Gets together: Not on file    Attends religious service: Not on file    Active member of club or organization: Not on file    Attends meetings of clubs or organizations: Not on file    Relationship status: Not on file  Other Topics Concern  . Not on file  Social History Narrative  . Not on file     Family History: The patient's family history includes Breast cancer in his mother; Cancer in his daughter; Heart disease in his father and mother. ROS:   Please see the history of present illness.    All other systems reviewed and are negative.  EKGs/Labs/Other Studies Reviewed:    The following studies were reviewed today: Most recent device check end of December reviewed  stable parameters  Recent Labs:   04/18/18 Cr 1.65 GFR 42 cc/m K 4.8 A1C 6.5%  Hgb 12.0 No results found for requested labs within last 8760 hours.  Recent Lipid Panel No results found for: CHOL, TRIG, HDL, CHOLHDL, VLDL, LDLCALC, LDLDIRECT  Physical Exam:    VS:  BP (!) 150/80 (BP Location: Right Arm, Patient Position: Sitting, Cuff Size: Normal)   Pulse 61   Ht 5\' 8"  (1.727 m)   Wt 223 lb (101.2 kg)   SpO2 97%   BMI 33.91 kg/m     Wt Readings from Last 3 Encounters:  05/10/18 223 lb (101.2 kg)  02/07/18 225 lb 12.8 oz (102.4 kg)  10/19/17 218 lb (98.9 kg)     GEN:  Well nourished, well developed in no acute distress HEENT: Normal NECK: No JVD; No carotid bruits LYMPHATICS: No lymphadenopathy CARDIAC: RRR, no murmurs, rubs, gallops RESPIRATORY:  Clear to auscultation without rales, wheezing or rhonchi  ABDOMEN: Soft, non-tender, non-distended MUSCULOSKELETAL:  No edema; No deformity  SKIN: Warm and dry NEUROLOGIC:  Alert and oriented x 3 PSYCHIATRIC:  Normal affect    Signed, Shirlee More, MD  05/10/2018 9:49 AM    [place]

## 2018-05-10 ENCOUNTER — Encounter: Payer: Self-pay | Admitting: Cardiology

## 2018-05-10 ENCOUNTER — Ambulatory Visit (INDEPENDENT_AMBULATORY_CARE_PROVIDER_SITE_OTHER): Payer: Medicare Other | Admitting: Cardiology

## 2018-05-10 VITALS — BP 150/80 | HR 61 | Ht 68.0 in | Wt 223.0 lb

## 2018-05-10 DIAGNOSIS — Z79899 Other long term (current) drug therapy: Secondary | ICD-10-CM | POA: Diagnosis not present

## 2018-05-10 DIAGNOSIS — N183 Chronic kidney disease, stage 3 unspecified: Secondary | ICD-10-CM

## 2018-05-10 DIAGNOSIS — I25119 Atherosclerotic heart disease of native coronary artery with unspecified angina pectoris: Secondary | ICD-10-CM | POA: Diagnosis not present

## 2018-05-10 DIAGNOSIS — I129 Hypertensive chronic kidney disease with stage 1 through stage 4 chronic kidney disease, or unspecified chronic kidney disease: Secondary | ICD-10-CM

## 2018-05-10 DIAGNOSIS — Z95810 Presence of automatic (implantable) cardiac defibrillator: Secondary | ICD-10-CM

## 2018-05-10 DIAGNOSIS — I5042 Chronic combined systolic (congestive) and diastolic (congestive) heart failure: Secondary | ICD-10-CM | POA: Diagnosis not present

## 2018-05-10 DIAGNOSIS — E785 Hyperlipidemia, unspecified: Secondary | ICD-10-CM

## 2018-05-10 DIAGNOSIS — I5022 Chronic systolic (congestive) heart failure: Secondary | ICD-10-CM | POA: Diagnosis not present

## 2018-05-10 DIAGNOSIS — I110 Hypertensive heart disease with heart failure: Secondary | ICD-10-CM

## 2018-05-10 NOTE — Patient Instructions (Signed)

## 2018-05-11 DIAGNOSIS — R5381 Other malaise: Secondary | ICD-10-CM | POA: Diagnosis not present

## 2018-05-11 DIAGNOSIS — I5022 Chronic systolic (congestive) heart failure: Secondary | ICD-10-CM | POA: Diagnosis not present

## 2018-05-14 ENCOUNTER — Telehealth: Payer: Self-pay | Admitting: Cardiology

## 2018-05-14 DIAGNOSIS — R5381 Other malaise: Secondary | ICD-10-CM | POA: Diagnosis not present

## 2018-05-14 DIAGNOSIS — I5022 Chronic systolic (congestive) heart failure: Secondary | ICD-10-CM | POA: Diagnosis not present

## 2018-05-14 MED ORDER — ISOSORBIDE MONONITRATE ER 60 MG PO TB24: 60.0000 mg | ORAL_TABLET | Freq: Every day | ORAL | 0 refills | Status: DC

## 2018-05-14 NOTE — Addendum Note (Signed)
Addended by: Austin Miles on: 05/14/2018 01:40 PM   Modules accepted: Orders

## 2018-05-14 NOTE — Telephone Encounter (Signed)
Call isosorbide to [place] [facility]

## 2018-05-14 NOTE — Telephone Encounter (Signed)
Refill for isosorbide mononitrate sent to [facility] in [place] as requested.

## 2018-05-16 DIAGNOSIS — R5381 Other malaise: Secondary | ICD-10-CM | POA: Diagnosis not present

## 2018-05-16 DIAGNOSIS — I5022 Chronic systolic (congestive) heart failure: Secondary | ICD-10-CM | POA: Diagnosis not present

## 2018-05-18 DIAGNOSIS — R5381 Other malaise: Secondary | ICD-10-CM | POA: Diagnosis not present

## 2018-05-18 DIAGNOSIS — I5022 Chronic systolic (congestive) heart failure: Secondary | ICD-10-CM | POA: Diagnosis not present

## 2018-05-21 DIAGNOSIS — I5022 Chronic systolic (congestive) heart failure: Secondary | ICD-10-CM | POA: Diagnosis not present

## 2018-05-21 DIAGNOSIS — R5381 Other malaise: Secondary | ICD-10-CM | POA: Diagnosis not present

## 2018-05-23 DIAGNOSIS — R5381 Other malaise: Secondary | ICD-10-CM | POA: Diagnosis not present

## 2018-05-23 DIAGNOSIS — I5022 Chronic systolic (congestive) heart failure: Secondary | ICD-10-CM | POA: Diagnosis not present

## 2018-05-25 DIAGNOSIS — I5022 Chronic systolic (congestive) heart failure: Secondary | ICD-10-CM | POA: Diagnosis not present

## 2018-05-25 DIAGNOSIS — R5381 Other malaise: Secondary | ICD-10-CM | POA: Diagnosis not present

## 2018-05-28 DIAGNOSIS — R5381 Other malaise: Secondary | ICD-10-CM | POA: Diagnosis not present

## 2018-05-28 DIAGNOSIS — I5022 Chronic systolic (congestive) heart failure: Secondary | ICD-10-CM | POA: Diagnosis not present

## 2018-05-29 DIAGNOSIS — N183 Chronic kidney disease, stage 3 (moderate): Secondary | ICD-10-CM | POA: Diagnosis not present

## 2018-05-29 DIAGNOSIS — I129 Hypertensive chronic kidney disease with stage 1 through stage 4 chronic kidney disease, or unspecified chronic kidney disease: Secondary | ICD-10-CM | POA: Diagnosis not present

## 2018-05-30 DIAGNOSIS — I5022 Chronic systolic (congestive) heart failure: Secondary | ICD-10-CM | POA: Diagnosis not present

## 2018-05-30 DIAGNOSIS — R5381 Other malaise: Secondary | ICD-10-CM | POA: Diagnosis not present

## 2018-06-01 DIAGNOSIS — R5381 Other malaise: Secondary | ICD-10-CM | POA: Diagnosis not present

## 2018-06-01 DIAGNOSIS — I5022 Chronic systolic (congestive) heart failure: Secondary | ICD-10-CM | POA: Diagnosis not present

## 2018-06-04 DIAGNOSIS — R5381 Other malaise: Secondary | ICD-10-CM | POA: Diagnosis not present

## 2018-06-04 DIAGNOSIS — I951 Orthostatic hypotension: Secondary | ICD-10-CM | POA: Diagnosis not present

## 2018-06-04 DIAGNOSIS — I129 Hypertensive chronic kidney disease with stage 1 through stage 4 chronic kidney disease, or unspecified chronic kidney disease: Secondary | ICD-10-CM | POA: Diagnosis not present

## 2018-06-04 DIAGNOSIS — N183 Chronic kidney disease, stage 3 (moderate): Secondary | ICD-10-CM | POA: Diagnosis not present

## 2018-06-04 DIAGNOSIS — E559 Vitamin D deficiency, unspecified: Secondary | ICD-10-CM | POA: Diagnosis not present

## 2018-06-04 DIAGNOSIS — E889 Metabolic disorder, unspecified: Secondary | ICD-10-CM | POA: Diagnosis not present

## 2018-06-04 DIAGNOSIS — I5022 Chronic systolic (congestive) heart failure: Secondary | ICD-10-CM | POA: Diagnosis not present

## 2018-06-04 DIAGNOSIS — D3001 Benign neoplasm of right kidney: Secondary | ICD-10-CM | POA: Diagnosis not present

## 2018-06-04 DIAGNOSIS — M9080 Osteopathy in diseases classified elsewhere, unspecified site: Secondary | ICD-10-CM | POA: Diagnosis not present

## 2018-06-04 DIAGNOSIS — E1022 Type 1 diabetes mellitus with diabetic chronic kidney disease: Secondary | ICD-10-CM | POA: Diagnosis not present

## 2018-06-06 DIAGNOSIS — R5381 Other malaise: Secondary | ICD-10-CM | POA: Diagnosis not present

## 2018-06-06 DIAGNOSIS — I5022 Chronic systolic (congestive) heart failure: Secondary | ICD-10-CM | POA: Diagnosis not present

## 2018-06-08 DIAGNOSIS — I5022 Chronic systolic (congestive) heart failure: Secondary | ICD-10-CM | POA: Diagnosis not present

## 2018-06-08 DIAGNOSIS — R5381 Other malaise: Secondary | ICD-10-CM | POA: Diagnosis not present

## 2018-06-12 ENCOUNTER — Telehealth: Payer: Self-pay | Admitting: Cardiology

## 2018-06-12 NOTE — Telephone Encounter (Signed)
Please advise. Thanks.  

## 2018-06-12 NOTE — Telephone Encounter (Signed)
Patient  called and his Omega 3 that he has been on is really expensive now and wants to know if thereis something else he can take.Marland Kitchen

## 2018-06-12 NOTE — Telephone Encounter (Signed)
He can stop the fish oil I do not think he needs it any longer

## 2018-06-13 NOTE — Telephone Encounter (Signed)
Patient informed to stop taking fish oil at this time per Dr. Bettina Gavia. Patient verbalized understanding. No further questions.

## 2018-06-15 DIAGNOSIS — Z4502 Encounter for adjustment and management of automatic implantable cardiac defibrillator: Secondary | ICD-10-CM | POA: Diagnosis not present

## 2018-07-02 ENCOUNTER — Other Ambulatory Visit: Payer: Self-pay | Admitting: Cardiology

## 2018-07-17 ENCOUNTER — Other Ambulatory Visit: Payer: Self-pay | Admitting: Cardiology

## 2018-07-19 ENCOUNTER — Telehealth: Payer: Self-pay | Admitting: Cardiology

## 2018-07-19 ENCOUNTER — Other Ambulatory Visit: Payer: Self-pay | Admitting: Cardiology

## 2018-07-19 NOTE — Telephone Encounter (Signed)
Patient called returning your call.  Please call him back on his cell [phone]

## 2018-07-19 NOTE — Telephone Encounter (Signed)
I reviewed my office visits he was taken fenofibrate when seen in November and it was not on his medication list in February best of my ability I did not stop it and I think he should take it along with his a atorvastatin fish oil is optional and I am okay if he continues it or if he stops.  If willing let us renew his fenofibrate

## 2018-07-19 NOTE — Telephone Encounter (Signed)
Received Fenofibrate 160 mg daily refill request from pharmacy, chart shows this was dc'd at last office visit 05/10/18. Chart shows he's also taking atorvastatin 40 mg QD. Phoned patient to clarify. He states he wasn't told to stop the Fenofibrate and is sure that Dr. Bettina Gavia wanted him on both. He also said Dr. Bettina Gavia told him to stop taking fish oil but his wife insists that he keep taking it so he does.

## 2018-07-19 NOTE — Telephone Encounter (Signed)
°*  STAT* If patient is at the pharmacy, call can be transferred to refill team.   1. Which medications need to be refilled? (please list name of each medication and dose if known) Fenofibrate 160  2. Which pharmacy/location (including street and city if local pharmacy) is medication to be sent to?  [place], [place]. [phone] (Phone) [phone] (Fax)     3. Do they need a 30 day or 90 day supply? 90 day

## 2018-07-19 NOTE — Telephone Encounter (Signed)
Received refill request for Fenofibrate 160 mg. Noted this med was dc'd at last office visit 05/10/18. Currently taking Atorvastatin. Left voice message asking to return my call.

## 2018-07-20 MED ORDER — FENOFIBRATE 160 MG PO TABS: 160.0000 mg | ORAL_TABLET | Freq: Every day | ORAL | 1 refills | Status: DC

## 2018-07-20 NOTE — Telephone Encounter (Signed)
Phoned and informed patient that  Dr. Bettina Gavia would like him to continue fenofibrate 160 mg po QD. Dr. Bettina Gavia also said its OK for him to continue to take fish oil supplement. Prescription sent to [facility] in [place] per pt. preference

## 2018-07-20 NOTE — Addendum Note (Signed)
Addended by: Polly Cobia A on: 07/20/2018 08:26 AM   Modules accepted: Orders

## 2018-08-20 DIAGNOSIS — R2681 Unsteadiness on feet: Secondary | ICD-10-CM | POA: Diagnosis not present

## 2018-08-20 DIAGNOSIS — G629 Polyneuropathy, unspecified: Secondary | ICD-10-CM | POA: Diagnosis not present

## 2018-08-20 DIAGNOSIS — E1121 Type 2 diabetes mellitus with diabetic nephropathy: Secondary | ICD-10-CM | POA: Diagnosis not present

## 2018-08-20 DIAGNOSIS — D649 Anemia, unspecified: Secondary | ICD-10-CM | POA: Diagnosis not present

## 2018-08-20 DIAGNOSIS — E785 Hyperlipidemia, unspecified: Secondary | ICD-10-CM | POA: Diagnosis not present

## 2018-08-20 DIAGNOSIS — R209 Unspecified disturbances of skin sensation: Secondary | ICD-10-CM | POA: Diagnosis not present

## 2018-08-20 DIAGNOSIS — Z6833 Body mass index (BMI) 33.0-33.9, adult: Secondary | ICD-10-CM | POA: Diagnosis not present

## 2018-08-20 DIAGNOSIS — Z79899 Other long term (current) drug therapy: Secondary | ICD-10-CM | POA: Diagnosis not present

## 2018-08-20 DIAGNOSIS — M542 Cervicalgia: Secondary | ICD-10-CM | POA: Diagnosis not present

## 2018-08-20 DIAGNOSIS — I110 Hypertensive heart disease with heart failure: Secondary | ICD-10-CM | POA: Diagnosis not present

## 2018-08-20 DIAGNOSIS — E039 Hypothyroidism, unspecified: Secondary | ICD-10-CM | POA: Diagnosis not present

## 2018-08-21 DIAGNOSIS — E1121 Type 2 diabetes mellitus with diabetic nephropathy: Secondary | ICD-10-CM | POA: Diagnosis not present

## 2018-08-21 DIAGNOSIS — Z79899 Other long term (current) drug therapy: Secondary | ICD-10-CM | POA: Diagnosis not present

## 2018-08-21 DIAGNOSIS — G629 Polyneuropathy, unspecified: Secondary | ICD-10-CM | POA: Diagnosis not present

## 2018-08-21 DIAGNOSIS — R2681 Unsteadiness on feet: Secondary | ICD-10-CM | POA: Diagnosis not present

## 2018-08-21 DIAGNOSIS — D649 Anemia, unspecified: Secondary | ICD-10-CM | POA: Diagnosis not present

## 2018-08-21 DIAGNOSIS — R209 Unspecified disturbances of skin sensation: Secondary | ICD-10-CM | POA: Diagnosis not present

## 2018-08-21 DIAGNOSIS — M542 Cervicalgia: Secondary | ICD-10-CM | POA: Diagnosis not present

## 2018-08-21 DIAGNOSIS — E039 Hypothyroidism, unspecified: Secondary | ICD-10-CM | POA: Diagnosis not present

## 2018-08-21 DIAGNOSIS — E785 Hyperlipidemia, unspecified: Secondary | ICD-10-CM | POA: Diagnosis not present

## 2018-09-05 DIAGNOSIS — M9903 Segmental and somatic dysfunction of lumbar region: Secondary | ICD-10-CM | POA: Diagnosis not present

## 2018-09-05 DIAGNOSIS — S338XXA Sprain of other parts of lumbar spine and pelvis, initial encounter: Secondary | ICD-10-CM | POA: Diagnosis not present

## 2018-09-07 DIAGNOSIS — M9903 Segmental and somatic dysfunction of lumbar region: Secondary | ICD-10-CM | POA: Diagnosis not present

## 2018-09-07 DIAGNOSIS — S338XXA Sprain of other parts of lumbar spine and pelvis, initial encounter: Secondary | ICD-10-CM | POA: Diagnosis not present

## 2018-09-12 DIAGNOSIS — S338XXA Sprain of other parts of lumbar spine and pelvis, initial encounter: Secondary | ICD-10-CM | POA: Diagnosis not present

## 2018-09-12 DIAGNOSIS — M9903 Segmental and somatic dysfunction of lumbar region: Secondary | ICD-10-CM | POA: Diagnosis not present

## 2018-09-14 DIAGNOSIS — S338XXA Sprain of other parts of lumbar spine and pelvis, initial encounter: Secondary | ICD-10-CM | POA: Diagnosis not present

## 2018-09-14 DIAGNOSIS — Z4502 Encounter for adjustment and management of automatic implantable cardiac defibrillator: Secondary | ICD-10-CM | POA: Diagnosis not present

## 2018-09-14 DIAGNOSIS — M9903 Segmental and somatic dysfunction of lumbar region: Secondary | ICD-10-CM | POA: Diagnosis not present

## 2018-09-17 DIAGNOSIS — H52223 Regular astigmatism, bilateral: Secondary | ICD-10-CM | POA: Diagnosis not present

## 2018-09-17 DIAGNOSIS — H3562 Retinal hemorrhage, left eye: Secondary | ICD-10-CM | POA: Diagnosis not present

## 2018-09-17 DIAGNOSIS — E113212 Type 2 diabetes mellitus with mild nonproliferative diabetic retinopathy with macular edema, left eye: Secondary | ICD-10-CM | POA: Diagnosis not present

## 2018-09-17 DIAGNOSIS — H524 Presbyopia: Secondary | ICD-10-CM | POA: Diagnosis not present

## 2018-09-17 DIAGNOSIS — Z794 Long term (current) use of insulin: Secondary | ICD-10-CM | POA: Diagnosis not present

## 2018-09-17 DIAGNOSIS — Z961 Presence of intraocular lens: Secondary | ICD-10-CM | POA: Diagnosis not present

## 2018-09-17 DIAGNOSIS — E119 Type 2 diabetes mellitus without complications: Secondary | ICD-10-CM | POA: Diagnosis not present

## 2018-09-17 DIAGNOSIS — H26492 Other secondary cataract, left eye: Secondary | ICD-10-CM | POA: Diagnosis not present

## 2018-09-17 DIAGNOSIS — Z7984 Long term (current) use of oral hypoglycemic drugs: Secondary | ICD-10-CM | POA: Diagnosis not present

## 2018-09-17 DIAGNOSIS — H35373 Puckering of macula, bilateral: Secondary | ICD-10-CM | POA: Diagnosis not present

## 2018-09-17 DIAGNOSIS — H26491 Other secondary cataract, right eye: Secondary | ICD-10-CM | POA: Diagnosis not present

## 2018-09-17 DIAGNOSIS — H5203 Hypermetropia, bilateral: Secondary | ICD-10-CM | POA: Diagnosis not present

## 2018-09-19 DIAGNOSIS — N183 Chronic kidney disease, stage 3 (moderate): Secondary | ICD-10-CM | POA: Diagnosis not present

## 2018-09-19 DIAGNOSIS — I214 Non-ST elevation (NSTEMI) myocardial infarction: Secondary | ICD-10-CM | POA: Diagnosis not present

## 2018-09-19 DIAGNOSIS — I5022 Chronic systolic (congestive) heart failure: Secondary | ICD-10-CM | POA: Diagnosis not present

## 2018-09-19 DIAGNOSIS — I951 Orthostatic hypotension: Secondary | ICD-10-CM | POA: Diagnosis not present

## 2018-09-19 DIAGNOSIS — M9903 Segmental and somatic dysfunction of lumbar region: Secondary | ICD-10-CM | POA: Diagnosis not present

## 2018-09-19 DIAGNOSIS — D3001 Benign neoplasm of right kidney: Secondary | ICD-10-CM | POA: Diagnosis not present

## 2018-09-19 DIAGNOSIS — E1022 Type 1 diabetes mellitus with diabetic chronic kidney disease: Secondary | ICD-10-CM | POA: Diagnosis not present

## 2018-09-19 DIAGNOSIS — S338XXA Sprain of other parts of lumbar spine and pelvis, initial encounter: Secondary | ICD-10-CM | POA: Diagnosis not present

## 2018-09-19 DIAGNOSIS — I498 Other specified cardiac arrhythmias: Secondary | ICD-10-CM | POA: Diagnosis not present

## 2018-09-19 DIAGNOSIS — I129 Hypertensive chronic kidney disease with stage 1 through stage 4 chronic kidney disease, or unspecified chronic kidney disease: Secondary | ICD-10-CM | POA: Diagnosis not present

## 2018-09-19 DIAGNOSIS — I130 Hypertensive heart and chronic kidney disease with heart failure and stage 1 through stage 4 chronic kidney disease, or unspecified chronic kidney disease: Secondary | ICD-10-CM | POA: Diagnosis not present

## 2018-09-19 DIAGNOSIS — Z95810 Presence of automatic (implantable) cardiac defibrillator: Secondary | ICD-10-CM | POA: Diagnosis not present

## 2018-09-21 DIAGNOSIS — M9903 Segmental and somatic dysfunction of lumbar region: Secondary | ICD-10-CM | POA: Diagnosis not present

## 2018-09-21 DIAGNOSIS — S338XXA Sprain of other parts of lumbar spine and pelvis, initial encounter: Secondary | ICD-10-CM | POA: Diagnosis not present

## 2018-09-28 DIAGNOSIS — S338XXA Sprain of other parts of lumbar spine and pelvis, initial encounter: Secondary | ICD-10-CM | POA: Diagnosis not present

## 2018-09-28 DIAGNOSIS — M9903 Segmental and somatic dysfunction of lumbar region: Secondary | ICD-10-CM | POA: Diagnosis not present

## 2018-10-02 DIAGNOSIS — Z79899 Other long term (current) drug therapy: Secondary | ICD-10-CM | POA: Diagnosis not present

## 2018-10-02 DIAGNOSIS — R2681 Unsteadiness on feet: Secondary | ICD-10-CM | POA: Diagnosis not present

## 2018-10-02 DIAGNOSIS — G629 Polyneuropathy, unspecified: Secondary | ICD-10-CM | POA: Diagnosis not present

## 2018-10-02 DIAGNOSIS — Z6833 Body mass index (BMI) 33.0-33.9, adult: Secondary | ICD-10-CM | POA: Diagnosis not present

## 2018-10-03 DIAGNOSIS — M6281 Muscle weakness (generalized): Secondary | ICD-10-CM | POA: Diagnosis not present

## 2018-10-03 DIAGNOSIS — S338XXA Sprain of other parts of lumbar spine and pelvis, initial encounter: Secondary | ICD-10-CM | POA: Diagnosis not present

## 2018-10-03 DIAGNOSIS — M9903 Segmental and somatic dysfunction of lumbar region: Secondary | ICD-10-CM | POA: Diagnosis not present

## 2018-10-03 DIAGNOSIS — R2689 Other abnormalities of gait and mobility: Secondary | ICD-10-CM | POA: Diagnosis not present

## 2018-10-04 DIAGNOSIS — R2689 Other abnormalities of gait and mobility: Secondary | ICD-10-CM | POA: Diagnosis not present

## 2018-10-04 DIAGNOSIS — M6281 Muscle weakness (generalized): Secondary | ICD-10-CM | POA: Diagnosis not present

## 2018-10-09 DIAGNOSIS — N183 Chronic kidney disease, stage 3 (moderate): Secondary | ICD-10-CM | POA: Diagnosis not present

## 2018-10-09 DIAGNOSIS — E875 Hyperkalemia: Secondary | ICD-10-CM | POA: Diagnosis not present

## 2018-10-09 DIAGNOSIS — E889 Metabolic disorder, unspecified: Secondary | ICD-10-CM | POA: Diagnosis not present

## 2018-10-09 DIAGNOSIS — M9080 Osteopathy in diseases classified elsewhere, unspecified site: Secondary | ICD-10-CM | POA: Diagnosis not present

## 2018-10-09 DIAGNOSIS — I5022 Chronic systolic (congestive) heart failure: Secondary | ICD-10-CM | POA: Diagnosis not present

## 2018-10-09 DIAGNOSIS — I129 Hypertensive chronic kidney disease with stage 1 through stage 4 chronic kidney disease, or unspecified chronic kidney disease: Secondary | ICD-10-CM | POA: Diagnosis not present

## 2018-10-09 DIAGNOSIS — E1022 Type 1 diabetes mellitus with diabetic chronic kidney disease: Secondary | ICD-10-CM | POA: Diagnosis not present

## 2018-10-09 DIAGNOSIS — E559 Vitamin D deficiency, unspecified: Secondary | ICD-10-CM | POA: Diagnosis not present

## 2018-10-10 ENCOUNTER — Other Ambulatory Visit: Payer: Self-pay | Admitting: Cardiology

## 2018-10-10 DIAGNOSIS — M9903 Segmental and somatic dysfunction of lumbar region: Secondary | ICD-10-CM | POA: Diagnosis not present

## 2018-10-10 DIAGNOSIS — S338XXA Sprain of other parts of lumbar spine and pelvis, initial encounter: Secondary | ICD-10-CM | POA: Diagnosis not present

## 2018-10-11 NOTE — Telephone Encounter (Signed)
Atorvastatin refill sent to [place]

## 2018-10-12 DIAGNOSIS — R2689 Other abnormalities of gait and mobility: Secondary | ICD-10-CM | POA: Diagnosis not present

## 2018-10-12 DIAGNOSIS — M6281 Muscle weakness (generalized): Secondary | ICD-10-CM | POA: Diagnosis not present

## 2018-10-16 ENCOUNTER — Other Ambulatory Visit: Payer: Self-pay | Admitting: Cardiology

## 2018-10-16 DIAGNOSIS — R7989 Other specified abnormal findings of blood chemistry: Secondary | ICD-10-CM | POA: Diagnosis not present

## 2018-10-17 NOTE — Telephone Encounter (Signed)
Spoke with patient and confirmed that he is taking Ranexa 500mg  0.5 tablets (250mg ) twice daily.  He was instructed that Rx had been sent to pharmacy on 10-17-2018.    Appointment was made to follow up with Dr Bettina Gavia on 11-09-2018.  Patient agreed to plan and verbalized understanding.

## 2018-10-17 NOTE — Addendum Note (Signed)
Addended by: Stevan Born on: 10/17/2018 09:03 AM   Modules accepted: Orders

## 2018-10-18 DIAGNOSIS — E119 Type 2 diabetes mellitus without complications: Secondary | ICD-10-CM | POA: Diagnosis not present

## 2018-10-18 DIAGNOSIS — E113212 Type 2 diabetes mellitus with mild nonproliferative diabetic retinopathy with macular edema, left eye: Secondary | ICD-10-CM | POA: Diagnosis not present

## 2018-10-18 DIAGNOSIS — H52223 Regular astigmatism, bilateral: Secondary | ICD-10-CM | POA: Diagnosis not present

## 2018-10-18 DIAGNOSIS — H524 Presbyopia: Secondary | ICD-10-CM | POA: Diagnosis not present

## 2018-10-18 DIAGNOSIS — H5203 Hypermetropia, bilateral: Secondary | ICD-10-CM | POA: Diagnosis not present

## 2018-10-18 DIAGNOSIS — H5347 Heteronymous bilateral field defects: Secondary | ICD-10-CM | POA: Diagnosis not present

## 2018-10-22 DIAGNOSIS — I10 Essential (primary) hypertension: Secondary | ICD-10-CM | POA: Diagnosis not present

## 2018-10-25 DIAGNOSIS — R2689 Other abnormalities of gait and mobility: Secondary | ICD-10-CM | POA: Diagnosis not present

## 2018-10-25 DIAGNOSIS — M6281 Muscle weakness (generalized): Secondary | ICD-10-CM | POA: Diagnosis not present

## 2018-10-29 DIAGNOSIS — M6281 Muscle weakness (generalized): Secondary | ICD-10-CM | POA: Diagnosis not present

## 2018-10-29 DIAGNOSIS — R2689 Other abnormalities of gait and mobility: Secondary | ICD-10-CM | POA: Diagnosis not present

## 2018-11-02 DIAGNOSIS — R2689 Other abnormalities of gait and mobility: Secondary | ICD-10-CM | POA: Diagnosis not present

## 2018-11-02 DIAGNOSIS — M6281 Muscle weakness (generalized): Secondary | ICD-10-CM | POA: Diagnosis not present

## 2018-11-05 DIAGNOSIS — R2689 Other abnormalities of gait and mobility: Secondary | ICD-10-CM | POA: Diagnosis not present

## 2018-11-05 DIAGNOSIS — M6281 Muscle weakness (generalized): Secondary | ICD-10-CM | POA: Diagnosis not present

## 2018-11-09 ENCOUNTER — Other Ambulatory Visit: Payer: Self-pay | Admitting: Cardiology

## 2018-11-09 ENCOUNTER — Telehealth: Payer: Self-pay | Admitting: Cardiology

## 2018-11-09 ENCOUNTER — Ambulatory Visit: Payer: Medicare Other | Admitting: Cardiology

## 2018-11-09 DIAGNOSIS — R2689 Other abnormalities of gait and mobility: Secondary | ICD-10-CM | POA: Diagnosis not present

## 2018-11-09 DIAGNOSIS — M6281 Muscle weakness (generalized): Secondary | ICD-10-CM | POA: Diagnosis not present

## 2018-11-09 MED ORDER — ATORVASTATIN CALCIUM 40 MG PO TABS: 40.0000 mg | ORAL_TABLET | Freq: Every day | ORAL | 0 refills | Status: DC

## 2018-11-09 NOTE — Telephone Encounter (Signed)
Wants atorvastatin called to [place] [facility]/cancelled last appt, did not reschedule fyi

## 2018-11-09 NOTE — Addendum Note (Signed)
Addended by: Austin Miles on: 11/09/2018 10:32 AM   Modules accepted: Orders

## 2018-11-09 NOTE — Telephone Encounter (Signed)
14 day supply of atorvastatin sent to [facility] in [place] with no refills. Patient is overdue for an appointment and cancelled his appointment scheduled for 11/09/2018.

## 2018-11-12 DIAGNOSIS — R2689 Other abnormalities of gait and mobility: Secondary | ICD-10-CM | POA: Diagnosis not present

## 2018-11-12 DIAGNOSIS — M6281 Muscle weakness (generalized): Secondary | ICD-10-CM | POA: Diagnosis not present

## 2018-11-14 DIAGNOSIS — R2689 Other abnormalities of gait and mobility: Secondary | ICD-10-CM | POA: Diagnosis not present

## 2018-11-14 DIAGNOSIS — M6281 Muscle weakness (generalized): Secondary | ICD-10-CM | POA: Diagnosis not present

## 2018-11-19 DIAGNOSIS — M6281 Muscle weakness (generalized): Secondary | ICD-10-CM | POA: Diagnosis not present

## 2018-11-19 DIAGNOSIS — R2689 Other abnormalities of gait and mobility: Secondary | ICD-10-CM | POA: Diagnosis not present

## 2018-11-21 DIAGNOSIS — R2689 Other abnormalities of gait and mobility: Secondary | ICD-10-CM | POA: Diagnosis not present

## 2018-11-21 DIAGNOSIS — M6281 Muscle weakness (generalized): Secondary | ICD-10-CM | POA: Diagnosis not present

## 2018-11-23 ENCOUNTER — Other Ambulatory Visit: Payer: Self-pay | Admitting: Cardiology

## 2018-11-26 DIAGNOSIS — I110 Hypertensive heart disease with heart failure: Secondary | ICD-10-CM | POA: Diagnosis not present

## 2018-11-26 DIAGNOSIS — R2689 Other abnormalities of gait and mobility: Secondary | ICD-10-CM | POA: Diagnosis not present

## 2018-11-26 DIAGNOSIS — G629 Polyneuropathy, unspecified: Secondary | ICD-10-CM | POA: Diagnosis not present

## 2018-11-26 DIAGNOSIS — E1121 Type 2 diabetes mellitus with diabetic nephropathy: Secondary | ICD-10-CM | POA: Diagnosis not present

## 2018-11-26 DIAGNOSIS — Z6833 Body mass index (BMI) 33.0-33.9, adult: Secondary | ICD-10-CM | POA: Diagnosis not present

## 2018-11-26 DIAGNOSIS — E785 Hyperlipidemia, unspecified: Secondary | ICD-10-CM | POA: Diagnosis not present

## 2018-11-26 DIAGNOSIS — I25810 Atherosclerosis of coronary artery bypass graft(s) without angina pectoris: Secondary | ICD-10-CM | POA: Diagnosis not present

## 2018-11-26 DIAGNOSIS — M6281 Muscle weakness (generalized): Secondary | ICD-10-CM | POA: Diagnosis not present

## 2018-11-26 DIAGNOSIS — E039 Hypothyroidism, unspecified: Secondary | ICD-10-CM | POA: Diagnosis not present

## 2018-11-26 DIAGNOSIS — Z79899 Other long term (current) drug therapy: Secondary | ICD-10-CM | POA: Diagnosis not present

## 2018-11-27 ENCOUNTER — Other Ambulatory Visit: Payer: Self-pay | Admitting: Cardiology

## 2018-11-29 DIAGNOSIS — Z79899 Other long term (current) drug therapy: Secondary | ICD-10-CM | POA: Diagnosis not present

## 2018-11-29 DIAGNOSIS — E785 Hyperlipidemia, unspecified: Secondary | ICD-10-CM | POA: Diagnosis not present

## 2018-11-29 DIAGNOSIS — E039 Hypothyroidism, unspecified: Secondary | ICD-10-CM | POA: Diagnosis not present

## 2018-11-29 DIAGNOSIS — E1121 Type 2 diabetes mellitus with diabetic nephropathy: Secondary | ICD-10-CM | POA: Diagnosis not present

## 2018-11-30 DIAGNOSIS — Z95810 Presence of automatic (implantable) cardiac defibrillator: Secondary | ICD-10-CM | POA: Diagnosis not present

## 2018-11-30 DIAGNOSIS — I498 Other specified cardiac arrhythmias: Secondary | ICD-10-CM | POA: Diagnosis not present

## 2018-12-05 DIAGNOSIS — R2689 Other abnormalities of gait and mobility: Secondary | ICD-10-CM | POA: Diagnosis not present

## 2018-12-05 DIAGNOSIS — M6281 Muscle weakness (generalized): Secondary | ICD-10-CM | POA: Diagnosis not present

## 2018-12-13 ENCOUNTER — Other Ambulatory Visit: Payer: Self-pay | Admitting: Cardiology

## 2018-12-13 DIAGNOSIS — R2689 Other abnormalities of gait and mobility: Secondary | ICD-10-CM | POA: Diagnosis not present

## 2018-12-13 DIAGNOSIS — M6281 Muscle weakness (generalized): Secondary | ICD-10-CM | POA: Diagnosis not present

## 2018-12-14 DIAGNOSIS — Z4502 Encounter for adjustment and management of automatic implantable cardiac defibrillator: Secondary | ICD-10-CM | POA: Diagnosis not present

## 2018-12-19 DIAGNOSIS — R2689 Other abnormalities of gait and mobility: Secondary | ICD-10-CM | POA: Diagnosis not present

## 2018-12-19 DIAGNOSIS — M6281 Muscle weakness (generalized): Secondary | ICD-10-CM | POA: Diagnosis not present

## 2018-12-21 DIAGNOSIS — Z23 Encounter for immunization: Secondary | ICD-10-CM | POA: Diagnosis not present

## 2018-12-25 ENCOUNTER — Other Ambulatory Visit: Payer: Self-pay | Admitting: Cardiology

## 2018-12-25 DIAGNOSIS — R2689 Other abnormalities of gait and mobility: Secondary | ICD-10-CM | POA: Diagnosis not present

## 2018-12-25 DIAGNOSIS — M6281 Muscle weakness (generalized): Secondary | ICD-10-CM | POA: Diagnosis not present

## 2019-01-07 DIAGNOSIS — R748 Abnormal levels of other serum enzymes: Secondary | ICD-10-CM | POA: Diagnosis not present

## 2019-01-07 DIAGNOSIS — R7989 Other specified abnormal findings of blood chemistry: Secondary | ICD-10-CM | POA: Diagnosis not present

## 2019-01-15 ENCOUNTER — Other Ambulatory Visit: Payer: Self-pay | Admitting: Cardiology

## 2019-01-17 ENCOUNTER — Other Ambulatory Visit: Payer: Self-pay | Admitting: Cardiology

## 2019-01-21 DIAGNOSIS — H547 Unspecified visual loss: Secondary | ICD-10-CM | POA: Diagnosis not present

## 2019-01-21 DIAGNOSIS — E119 Type 2 diabetes mellitus without complications: Secondary | ICD-10-CM | POA: Diagnosis not present

## 2019-01-21 DIAGNOSIS — E113312 Type 2 diabetes mellitus with moderate nonproliferative diabetic retinopathy with macular edema, left eye: Secondary | ICD-10-CM | POA: Diagnosis not present

## 2019-01-21 DIAGNOSIS — H35373 Puckering of macula, bilateral: Secondary | ICD-10-CM | POA: Diagnosis not present

## 2019-01-21 DIAGNOSIS — H47093 Other disorders of optic nerve, not elsewhere classified, bilateral: Secondary | ICD-10-CM | POA: Diagnosis not present

## 2019-01-21 DIAGNOSIS — H47013 Ischemic optic neuropathy, bilateral: Secondary | ICD-10-CM | POA: Diagnosis not present

## 2019-01-23 ENCOUNTER — Other Ambulatory Visit: Payer: Self-pay | Admitting: Cardiology

## 2019-01-29 ENCOUNTER — Other Ambulatory Visit: Payer: Self-pay | Admitting: Cardiology

## 2019-02-05 DIAGNOSIS — I129 Hypertensive chronic kidney disease with stage 1 through stage 4 chronic kidney disease, or unspecified chronic kidney disease: Secondary | ICD-10-CM | POA: Diagnosis not present

## 2019-02-05 DIAGNOSIS — N183 Chronic kidney disease, stage 3 unspecified: Secondary | ICD-10-CM | POA: Diagnosis not present

## 2019-02-06 ENCOUNTER — Other Ambulatory Visit: Payer: Self-pay | Admitting: Cardiology

## 2019-02-11 DIAGNOSIS — N1831 Chronic kidney disease, stage 3a: Secondary | ICD-10-CM | POA: Diagnosis not present

## 2019-02-11 DIAGNOSIS — E1022 Type 1 diabetes mellitus with diabetic chronic kidney disease: Secondary | ICD-10-CM | POA: Diagnosis not present

## 2019-02-11 DIAGNOSIS — N183 Chronic kidney disease, stage 3 unspecified: Secondary | ICD-10-CM | POA: Diagnosis not present

## 2019-02-11 DIAGNOSIS — I129 Hypertensive chronic kidney disease with stage 1 through stage 4 chronic kidney disease, or unspecified chronic kidney disease: Secondary | ICD-10-CM | POA: Diagnosis not present

## 2019-02-11 DIAGNOSIS — D631 Anemia in chronic kidney disease: Secondary | ICD-10-CM | POA: Diagnosis not present

## 2019-02-11 DIAGNOSIS — E559 Vitamin D deficiency, unspecified: Secondary | ICD-10-CM | POA: Diagnosis not present

## 2019-02-11 DIAGNOSIS — M9080 Osteopathy in diseases classified elsewhere, unspecified site: Secondary | ICD-10-CM | POA: Diagnosis not present

## 2019-02-11 DIAGNOSIS — E889 Metabolic disorder, unspecified: Secondary | ICD-10-CM | POA: Diagnosis not present

## 2019-02-21 ENCOUNTER — Other Ambulatory Visit: Payer: Self-pay | Admitting: Cardiology

## 2019-02-21 DIAGNOSIS — G629 Polyneuropathy, unspecified: Secondary | ICD-10-CM | POA: Diagnosis not present

## 2019-02-21 DIAGNOSIS — Z1159 Encounter for screening for other viral diseases: Secondary | ICD-10-CM | POA: Diagnosis not present

## 2019-02-21 DIAGNOSIS — Z125 Encounter for screening for malignant neoplasm of prostate: Secondary | ICD-10-CM | POA: Diagnosis not present

## 2019-02-21 DIAGNOSIS — R531 Weakness: Secondary | ICD-10-CM | POA: Diagnosis not present

## 2019-02-21 DIAGNOSIS — Z79899 Other long term (current) drug therapy: Secondary | ICD-10-CM | POA: Diagnosis not present

## 2019-02-21 DIAGNOSIS — H04129 Dry eye syndrome of unspecified lacrimal gland: Secondary | ICD-10-CM | POA: Diagnosis not present

## 2019-03-03 NOTE — Progress Notes (Signed)
Cardiology Office Note:    Date:  03/05/2019   ID:  James Rutter., DOB Jan 06, 1951, MRN [id number]  PCP:  Ernestene Kiel, MD  Cardiologist:  Shirlee More, MD    Referring MD: Ernestene Kiel, MD    ASSESSMENT:    1. Coronary artery disease involving native coronary artery of native heart with angina pectoris ([place])   2. Hypertensive heart disease with chronic combined systolic and diastolic congestive heart failure ([place])   3. Chronic systolic congestive heart failure ([place])   4. On amiodarone therapy   5. Ventricular tachyarrhythmia ([place])   6. Typical atrial flutter ([place])   7. CKD stage 3 due to type 1 diabetes mellitus ([place])   8. Mixed hyperlipidemia    PLAN:    In order of problems listed above:  1. Stable CAD having no anginal discomfort continue beta-blocker oral nitrates and stop ranolazine with his disequilibrium.  Resume antiplatelet therapy with clopidogrel 2. Stable BP at target continue his loop diuretic 3. Compensated continue current treatment 4. Continue current amiodarone labs are followed in his PCP office every 6 months CMP and thyroid I would not decrease dose as he had breakthrough atrial arrhythmia in the past 5. Stable followed by EP Dr. Ola Spurr [facility] no recurrence of symptomatic ventricular tachycardia or atrial flutter 6. Stable CKD 7. Lipids are ideal continue his high intensity statin and fenofibrate   Next appointment: 6 months   Medication Adjustments/Labs and Tests Ordered: Current medicines are reviewed at length with the patient today.  Concerns regarding medicines are outlined above.  No orders of the defined types were placed in this encounter.  No orders of the defined types were placed in this encounter.   Chief Complaint  Patient presents with  . Follow-up    with ICD  . Coronary Artery Disease  . Congestive Heart Failure  . Hypertension  . Hyperlipidemia  . Chronic Kidney Disease    History of Present  Illness:    James [name]. is a 68 y.o. male with a hx of CAD, CHF EF 30%, Atrial Flutter, VT, Dyslipidemia, HTN, S/P CABG in 2008, on amiodarone and ICD  last seen 05/10/2018.  He sees EP Dr. Adrian Prows [facility] regarding arrhythmia and ICD.  His last heart catheterization [facility] 11/21/2017 show that the left thoracic artery is patent to the LAD with a vein graft to the right coronary artery was chronically occluded and the vessel filled distally by LAD collaterals the vein graft to the obtuse marginal was patent left circumflex artery was occluded proximally EF 35% with global hypokinesia and inferior akinesia. Yes compliance with diet, lifestyle and medications: Yes  Cardiology perspective generally has done well he has no angina edema shortness of breath palpitation or syncope and tolerates amiodarone.  Unfortunately is off antiplatelet therapy I do not know why I suspect after his nephrectomy and will resume clopidogrel 75 mg daily.  His biggest problem is just a sense of disequilibrium being unsteady is afraid to walk outdoors and may be well be due to his ranolazine he is having no angina I asked him to discontinue.  We will recheck echocardiogram for ejection fraction and continue his current guideline directed therapy including loop diuretic beta-blocker and Entresto for heart failure and LV dysfunction.  Not on spironolactone because of his CKD. Recent labs 11/29/2018 shows a cholesterol 166 LDL 86 HDL 53 creatinine 1.83 TSH was normal. Past Medical History:  Diagnosis  Date  . Atrial arrhythmia 02/15/2014  . CAD in native artery 08/27/2014  . Cardiac dysrhythmia 02/12/2014  . CKD stage 3 due to type 1 diabetes mellitus ([place]) 08/28/2014   Overview:  Cr 1.3 at discharge 08/21/14  . COPD GOLD II 10/17/2014   Followed in Pulmonary clinic/ [facility]/ [place]  - PFTs   10/17/2014 FEV1  2.09 ( 53%) ratio 62 no sign better p B2 and dlco 63% and  corrects to 85%  - 10/17/2014 p extensive coaching HFA effectiveness =  90%    > try stiolto    . DM (diabetes mellitus) ([place])   . Dyspnea 09/05/2014   Followed [facility] Pulmonary clinic[facility] [place] - 09/05/2014  Walked RA x 3 laps @ 185 ft each stopped due to end of study, nl pace, no desat   - pfts 10/17/14 dlco 63% on Amiodarone > may benefit from 6 month f/u but defer to Cards    . Essential hypertension 09/11/2014   Changed acei to ARB  09/05/14 due to pseudowheeze> improved 10/17/14    . Heart failure ([place])   . Hyperlipidemia   . Hypertension   . Hypertensive heart disease with congestive heart failure ([place]) 08/27/2014  . ICD (implantable cardioverter-defibrillator) in place 08/27/2014  . NSTEMI (non-ST elevated myocardial infarction) ([place]) 02/12/2014  . Obesity [id number]   Complicated by dm/ hbp and low erv on pfts 10/17/14    . On amiodarone therapy 08/27/2014  . S/P ICD (internal cardiac defibrillator) procedure 02/15/2014  . Systolic CHF, chronic ([place]) 08/27/2014   Overview:  BNP 1350 at discharge K 3.9 on 08/21/14 Echo EF 35% May 2016  . Typical atrial flutter ([place]) 08/27/2014  . Ventricular tachyarrhythmia ([place]) 08/27/2014    Past Surgical History:  Procedure Laterality Date  . CORONARY ARTERY BYPASS GRAFT  2008  . EYE SURGERY    . ICD IMPLANT    . MOHS SURGERY     Right Arm  . NEPHRECTOMY Right     Current Medications: Current Meds  Medication Sig  . acetaminophen (TYLENOL) 500 MG tablet Take 500 mg by mouth every 6 (six) hours as needed.  . Alpha-Lipoic Acid 600 MG CAPS Take 1 capsule by mouth daily.  Marland Kitchen amiodarone (PACERONE) 200 MG tablet TAKE 2 TABLETS BY MOUTH ONCE DAILY.  Marland Kitchen Ascorbic Acid (VITAMIN C) 100 MG tablet Take 100 mg by mouth 2 (two) times daily.  Marland Kitchen atorvastatin (LIPITOR) 40 MG tablet Take 1 tablet (40 mg total) by mouth daily at 6 PM. *Further refills to be given at office visit *  . b complex vitamins capsule Take 1 capsule by mouth daily.  . carvedilol  (COREG) 25 MG tablet TAKE 1 TABLET BY MOUTH TWICE DAILY WITH MEALS. **MUST SCHEDULE APPONTMENT FOR REFILLS**  . Cholecalciferol (VITAMIN D3 PO) Take 400 mg by mouth daily.   . Coenzyme Q10 (CO Q 10 PO) Take 100 mg by mouth daily.  . Cranberry 400 MG CAPS Take 400 mg by mouth 2 (two) times daily.  . DULoxetine (CYMBALTA) 30 MG capsule Take 30 mg by mouth daily.  . fenofibrate 160 MG tablet TAKE 1 TABLET BY MOUTH ONCE DAILY.  . furosemide (LASIX) 40 MG tablet TAKE 1 TABLET BY MOUTH TWICE(2) DAILY  . Garlic [id number] MG TABS Take 500 mg by mouth daily.  . insulin detemir (LEVEMIR) 100 UNIT/ML injection Inject 6 Units into the skin daily.   . isosorbide mononitrate (IMDUR) 60 MG 24 hr tablet Take 1 tablet (60  mg total) by mouth daily.  Marland Kitchen levothyroxine (SYNTHROID, LEVOTHROID) 88 MCG tablet Take 88 mcg by mouth daily.  Marland Kitchen LORazepam (ATIVAN) 0.5 MG tablet Take 0.5 mg by mouth at bedtime as needed.  . metFORMIN (GLUCOPHAGE) 1000 MG tablet Take 500 mg by mouth 2 (two) times daily with a meal.   . Multiple Vitamin (MULTIVITAMIN) capsule Take 1 capsule by mouth daily.  . nitroGLYCERIN (NITROSTAT) 0.4 MG SL tablet Place 1 tablet (0.4 mg total) under the tongue every 5 (five) minutes as needed for chest pain.  . ranolazine (RANEXA) 500 MG 12 hr tablet TAKE 1 TABLET BY MOUTH TWICE(2) DAILY * *MUST HAVE APPT BEFORE MORE REFILLS* *  . sacubitril-valsartan (ENTRESTO) 97-103 MG Take 1 tablet by mouth 2 (two) times daily.  . Zinc 50 MG TABS Take 1 tablet by mouth daily.     Allergies:   Codeine   Social History   Socioeconomic History  . Marital status: Married    Spouse name: Not on file  . Number of children: Not on file  . Years of education: Not on file  . Highest education level: Not on file  Occupational History  . Occupation: Press photographer   Social Needs  . Financial resource strain: Not on file  . Food insecurity    Worry: Not on file    Inability: Not on file  . Transportation needs    Medical: Not on  file    Non-medical: Not on file  Tobacco Use  . Smoking status: Former Smoker    Packs/day: 1.00    Years: 38.00    Pack years: 38.00    Types: Cigarettes    Quit date: 04/04/2006    Years since quitting: 12.9  . Smokeless tobacco: Never Used  Substance and Sexual Activity  . Alcohol use: No    Alcohol/week: 0.0 standard drinks  . Drug use: No  . Sexual activity: Not on file  Lifestyle  . Physical activity    Days per week: Not on file    Minutes per session: Not on file  . Stress: Not on file  Relationships  . Social Herbalist on phone: Not on file    Gets together: Not on file    Attends religious service: Not on file    Active member of club or organization: Not on file    Attends meetings of clubs or organizations: Not on file    Relationship status: Not on file  Other Topics Concern  . Not on file  Social History Narrative  . Not on file     Family History: The patient's family history includes Breast cancer in his mother; Cancer in his daughter; Heart disease in his father and mother. ROS:   Please see the history of present illness.    All other systems reviewed and are negative.  EKGs/Labs/Other Studies Reviewed:    The following studies were reviewed today:  EKG:  EKG ordered today and personally reviewed.  Sinus rhythm old inferior MI normal QT   I reviewed his last device check 12/14/2018 I cannot access the reports. Recent Labs: 02/05/2019 BMP had a creatinine of 1.68 potassium 4.7 GFR stable 41 cc  Liver function was normal 04/18/2018 except for isolated elevation of ALT. 01/03/2018 cholesterol 165 LDL 86  Physical Exam:    VS:  BP (!) 102/56 (BP Location: Right Arm, Patient Position: Sitting, Cuff Size: Large)   Pulse 61   Ht 5\' 8"  (1.727 m)  Wt 236 lb 12.8 oz (107.4 kg)   SpO2 98%   BMI 36.01 kg/m     Wt Readings from Last 3 Encounters:  03/05/19 236 lb 12.8 oz (107.4 kg)  05/10/18 223 lb (101.2 kg)  02/07/18 225 lb 12.8 oz  (102.4 kg)     GEN:  Well nourished, well developed in no acute distress HEENT: Normal NECK: No JVD; No carotid bruits LYMPHATICS: No lymphadenopathy CARDIAC: S2 is paradoxical RRR, no murmurs, rubs, gallops RESPIRATORY:  Clear to auscultation without rales, wheezing or rhonchi  ABDOMEN: Soft, non-tender, non-distended MUSCULOSKELETAL:  No edema; No deformity  SKIN: Warm and dry NEUROLOGIC:  Alert and oriented x 3 PSYCHIATRIC:  Normal affect    Signed, Shirlee More, MD  03/05/2019 9:19 AM    [place]

## 2019-03-05 ENCOUNTER — Encounter: Payer: Self-pay | Admitting: Cardiology

## 2019-03-05 ENCOUNTER — Ambulatory Visit (INDEPENDENT_AMBULATORY_CARE_PROVIDER_SITE_OTHER): Payer: Medicare Other | Admitting: Cardiology

## 2019-03-05 ENCOUNTER — Other Ambulatory Visit: Payer: Self-pay

## 2019-03-05 VITALS — BP 102/56 | HR 61 | Ht 68.0 in | Wt 236.8 lb

## 2019-03-05 DIAGNOSIS — I5042 Chronic combined systolic (congestive) and diastolic (congestive) heart failure: Secondary | ICD-10-CM | POA: Diagnosis not present

## 2019-03-05 DIAGNOSIS — Z79899 Other long term (current) drug therapy: Secondary | ICD-10-CM | POA: Diagnosis not present

## 2019-03-05 DIAGNOSIS — I483 Typical atrial flutter: Secondary | ICD-10-CM

## 2019-03-05 DIAGNOSIS — I472 Ventricular tachycardia, unspecified: Secondary | ICD-10-CM

## 2019-03-05 DIAGNOSIS — I110 Hypertensive heart disease with heart failure: Secondary | ICD-10-CM | POA: Diagnosis not present

## 2019-03-05 DIAGNOSIS — N183 Chronic kidney disease, stage 3 unspecified: Secondary | ICD-10-CM

## 2019-03-05 DIAGNOSIS — I25119 Atherosclerotic heart disease of native coronary artery with unspecified angina pectoris: Secondary | ICD-10-CM

## 2019-03-05 DIAGNOSIS — I5022 Chronic systolic (congestive) heart failure: Secondary | ICD-10-CM

## 2019-03-05 DIAGNOSIS — E1022 Type 1 diabetes mellitus with diabetic chronic kidney disease: Secondary | ICD-10-CM

## 2019-03-05 DIAGNOSIS — E782 Mixed hyperlipidemia: Secondary | ICD-10-CM | POA: Diagnosis not present

## 2019-03-05 MED ORDER — CARVEDILOL 25 MG PO TABS: ORAL_TABLET | ORAL | 1 refills | Status: DC

## 2019-03-05 MED ORDER — FENOFIBRATE 160 MG PO TABS: 160.0000 mg | ORAL_TABLET | Freq: Every day | ORAL | 1 refills | Status: DC

## 2019-03-05 MED ORDER — ISOSORBIDE MONONITRATE ER 60 MG PO TB24: 60.0000 mg | ORAL_TABLET | Freq: Every day | ORAL | 1 refills | Status: DC

## 2019-03-05 MED ORDER — FUROSEMIDE 40 MG PO TABS: ORAL_TABLET | ORAL | 1 refills | Status: DC

## 2019-03-05 MED ORDER — NITROGLYCERIN 0.4 MG SL SUBL: 0.4000 mg | SUBLINGUAL_TABLET | SUBLINGUAL | 5 refills | Status: AC | PRN

## 2019-03-05 MED ORDER — CLOPIDOGREL BISULFATE 75 MG PO TABS: 75.0000 mg | ORAL_TABLET | Freq: Every day | ORAL | 1 refills | Status: DC

## 2019-03-05 MED ORDER — AMIODARONE HCL 200 MG PO TABS: 400.0000 mg | ORAL_TABLET | Freq: Every day | ORAL | 1 refills | Status: DC

## 2019-03-05 MED ORDER — ENTRESTO 97-103 MG PO TABS: 1.0000 | ORAL_TABLET | Freq: Two times a day (BID) | ORAL | 1 refills | Status: DC

## 2019-03-05 NOTE — Patient Instructions (Signed)
Medication Instructions:  Your physician has recommended you make the following change in your medication:   STOP ranolazine (ranexa)  START clopidogrel (plavix) 75 mg: Take 1 tablet daily   *If you need a refill on your cardiac medications before your next appointment, please call your pharmacy*  Lab Work: None  If you have labs (blood work) drawn today and your tests are completely normal, you will receive your results only by: Marland Kitchen MyChart Message (if you have MyChart) OR . A paper copy in the mail If you have any lab test that is abnormal or we need to change your treatment, we will call you to review the results.  Testing/Procedures: You had an EKG today.   Your physician has requested that you have an echocardiogram. Echocardiography is a painless test that uses sound waves to create images of your heart. It provides your doctor with information about the size and shape of your heart and how well your heart's chambers and valves are working. This procedure takes approximately one hour. There are no restrictions for this procedure.  Follow-Up: At [facility], you and your health needs are our priority.  As part of our continuing mission to provide you with exceptional heart care, we have created designated Provider Care Teams.  These Care Teams include your primary Cardiologist (physician) and Advanced Practice Providers (APPs -  Physician Assistants and Nurse Practitioners) who all work together to provide you with the care you need, when you need it.  Your next appointment:   6 month(s)  The format for your next appointment:   In Person  Provider:   Shirlee More, MD    Clopidogrel tablets What is this medicine? CLOPIDOGREL (kloh PID oh grel) helps to prevent blood clots. This medicine is used to prevent heart attack, stroke, or other vascular events in people who are at high risk. This medicine may be used for other purposes; ask your health care provider or pharmacist  if you have questions. COMMON BRAND NAME(S): Plavix What should I tell my health care provider before I take this medicine? They need to know if you have any of the following conditions:  bleeding disorders  bleeding in the brain  having surgery  history of stomach bleeding  an unusual or allergic reaction to clopidogrel, other medicines, foods, dyes, or preservatives  pregnant or trying to get pregnant  breast-feeding How should I use this medicine? Take this medicine by mouth with a glass of water. Follow the directions on the prescription label. You may take this medicine with or without food. If it upsets your stomach, take it with food. Take your medicine at regular intervals. Do not take it more often than directed. Do not stop taking except on your doctor's advice. A special MedGuide will be given to you by the pharmacist with each prescription and refill. Be sure to read this information carefully each time. Talk to your pediatrician regarding the use of this medicine in children. Special care may be needed. Overdosage: If you think you have taken too much of this medicine contact a poison control center or emergency room at once. NOTE: This medicine is only for you. Do not share this medicine with others. What if I miss a dose? If you miss a dose, take it as soon as you can. If it is almost time for your next dose, take only that dose. Do not take double or extra doses. What may interact with this medicine? Do not take this medicine with  the following medications:  dasabuvir; ombitasvir; paritaprevir; ritonavir  defibrotide  selexipag This medicine may also interact with the following medications:  certain medicines that treat or prevent blood clots like warfarin  narcotic medicines for pain  NSAIDs, medicines for pain and inflammation, like ibuprofen or naproxen  repaglinide  SNRIs, medicines for depression, like desvenlafaxine, duloxetine, levomilnacipran,  venlafaxine  SSRIs, medicines for depression, like citalopram, escitalopram, fluoxetine, fluvoxamine, paroxetine, sertraline  stomach acid blockers like cimetidine, esomeprazole, omeprazole This list may not describe all possible interactions. Give your health care provider a list of all the medicines, herbs, non-prescription drugs, or dietary supplements you use. Also tell them if you smoke, drink alcohol, or use illegal drugs. Some items may interact with your medicine. What should I watch for while using this medicine? Visit your doctor or health care professional for regular check-ups. Do not stop taking your medicine unless your doctor tells you to. Notify your doctor or health care professional and seek emergency treatment if you develop breathing problems; changes in vision; chest pain; severe, sudden headache; pain, swelling, warmth in the leg; trouble speaking; sudden numbness or weakness of the face, arm or leg. These can be signs that your condition has gotten worse. If you are going to have surgery or dental work, tell your doctor or health care professional that you are taking this medicine. Certain genetic factors may reduce the effect of this medicine. Your doctor may use genetic tests to determine treatment. Only take aspirin if you are instructed to. Low doses of aspirin are used with this medicine to treat some conditions. Taking aspirin with this medicine can increase your risk of bleeding so you must be careful. Talk to your doctor or pharmacist if you have questions. What side effects may I notice from receiving this medicine? Side effects that you should report to your doctor or health care professional as soon as possible:  allergic reactions like skin rash, itching or hives, swelling of the face, lips, or tongue  signs and symptoms of bleeding such as bloody or black, tarry stools; red or dark-brown urine; spitting up blood or brown material that looks like coffee grounds;  red spots on the skin; unusual bruising or bleeding from the eye, gums, or nose  signs and symptoms of a blood clot such as breathing problems; changes in vision; chest pain; severe, sudden headache; pain, swelling, warmth in the leg; trouble speaking; sudden numbness or weakness of the face, arm or leg  signs and symptoms of low blood sugar such as feeling anxious; confusion; dizziness; increased hunger; unusually weak or tired; increased sweating; shakiness; cold, clammy skin; irritable; headache; blurred vision; fast heartbeat; loss of consciousness Side effects that usually do not require medical attention (report to your doctor or health care professional if they continue or are bothersome):  constipation  diarrhea  headache  upset stomach This list may not describe all possible side effects. Call your doctor for medical advice about side effects. You may report side effects to FDA at 1-800-FDA-1088. Where should I keep my medicine? Keep out of the reach of children. Store at room temperature of 59 to 86 degrees F (15 to 30 degrees C). Throw away any unused medicine after the expiration date. NOTE: This sheet is a summary. It may not cover all possible information. If you have questions about this medicine, talk to your doctor, pharmacist, or health care provider.  2020 Elsevier/Gold Standard (2017-08-21 15:03:38)

## 2019-03-13 DIAGNOSIS — M19012 Primary osteoarthritis, left shoulder: Secondary | ICD-10-CM | POA: Diagnosis not present

## 2019-03-13 DIAGNOSIS — M25512 Pain in left shoulder: Secondary | ICD-10-CM | POA: Diagnosis not present

## 2019-03-16 DIAGNOSIS — Z20828 Contact with and (suspected) exposure to other viral communicable diseases: Secondary | ICD-10-CM | POA: Diagnosis not present

## 2019-03-16 DIAGNOSIS — I5022 Chronic systolic (congestive) heart failure: Secondary | ICD-10-CM | POA: Diagnosis not present

## 2019-03-16 DIAGNOSIS — R0602 Shortness of breath: Secondary | ICD-10-CM | POA: Diagnosis not present

## 2019-03-26 ENCOUNTER — Other Ambulatory Visit: Payer: Self-pay | Admitting: Cardiology

## 2019-04-01 ENCOUNTER — Other Ambulatory Visit: Payer: Self-pay

## 2019-04-01 ENCOUNTER — Encounter: Payer: Self-pay | Admitting: Neurology

## 2019-04-01 ENCOUNTER — Ambulatory Visit (INDEPENDENT_AMBULATORY_CARE_PROVIDER_SITE_OTHER): Payer: Medicare Other | Admitting: Neurology

## 2019-04-01 VITALS — BP 145/72 | HR 74 | Ht 68.0 in | Wt 234.0 lb

## 2019-04-01 DIAGNOSIS — E1142 Type 2 diabetes mellitus with diabetic polyneuropathy: Secondary | ICD-10-CM

## 2019-04-01 DIAGNOSIS — R278 Other lack of coordination: Secondary | ICD-10-CM | POA: Diagnosis not present

## 2019-04-01 DIAGNOSIS — I25119 Atherosclerotic heart disease of native coronary artery with unspecified angina pectoris: Secondary | ICD-10-CM | POA: Diagnosis not present

## 2019-04-01 NOTE — Progress Notes (Signed)
[name] Clinic Note - Initial Visit   Date: 04/01/19  James [name]. MRN: [id number] DOB: 09/13/1950   Dear Dr. Laqueta Due:  Thank you for your kind referral of James [name]. for consultation of neuropathy. Although his history is well known to you, please allow [place] to reiterate it for the purpose of our medical record. The patient was accompanied to the clinic by self.  History of Present Illness: James [name]. is a 68 y.o. right-handed male with well-controlled diabetes mellitus, hypothyroidism, hyperlipidemia, hypertension, CAD s/p CABG, atrial arrhythmia s/p ICD presenting for evaluation of bilateral feet numbness and pain.   Starting around 2015, he developed numbness over the toes, which has transitioned into burning sensation over the entire foot.  Symptoms are worse at night time and he denies pain during the day.  Numbness is constant over the tips of the toes and can extend around the ankle.  He feels as if his feet are sleep. Pain usually bothers him about twice per week and he gets adequate relief with frankincense oil.  Tried over-the-counter lidocaine cream which did not help.  He suffered one fall when stepping up and fell backwards over the past year.  He endorses imbalance, especially on uneven ground and in the shower.  He walks unassisted.  He completed PT for balance training earlier this year and is compliant with home exercises. He also complains of numbness/tingling over the fingerteips, which is worse in the left hand.  He used to play guitar for many years, but is unable to do it anymore.  He has been diabetic for a number of years which has been under very good control.     Past Medical History:  Diagnosis Date  . Atrial arrhythmia 02/15/2014  . CAD in native artery 08/27/2014  . Cardiac dysrhythmia 02/12/2014  . CKD stage 3 due to type 1 diabetes mellitus ([place]) 08/28/2014   Overview:  Cr 1.3 at discharge 08/21/14  . COPD GOLD  II 10/17/2014   Followed in Pulmonary clinic/ [facility]/ [place]  - PFTs   10/17/2014 FEV1  2.09 ( 53%) ratio 62 no sign better p B2 and dlco 63% and corrects to 85%  - 10/17/2014 p extensive coaching HFA effectiveness =  90%    > try stiolto    . DM (diabetes mellitus) ([place])   . Dyspnea 09/05/2014   Followed in Pulmonary clinic/ [facility]/ [place] - 09/05/2014  Walked RA x 3 laps @ 185 ft each stopped due to end of study, nl pace, no desat   - pfts 10/17/14 dlco 63% on Amiodarone > may benefit from 6 month f/u but defer to Cards    . Essential hypertension 09/11/2014   Changed acei to ARB  09/05/14 due to pseudowheeze> improved 10/17/14    . Heart failure ([place])   . Hyperlipidemia   . Hypertension   . Hypertensive heart disease with congestive heart failure ([place]) 08/27/2014  . ICD (implantable cardioverter-defibrillator) in place 08/27/2014  . NSTEMI (non-ST elevated myocardial infarction) ([place]) 02/12/2014  . Obesity [id number]   Complicated by dm/ hbp and low erv on pfts 10/17/14    . On amiodarone therapy 08/27/2014  . S/P ICD (internal cardiac defibrillator) procedure 02/15/2014  . Systolic CHF, chronic ([place]) 08/27/2014   Overview:  BNP 1350 at discharge K 3.9 on 08/21/14 Echo EF 35% May 2016  . Typical atrial flutter ([place]) 08/27/2014  . Ventricular tachyarrhythmia ([place]) 08/27/2014    Past  Surgical History:  Procedure Laterality Date  . CORONARY ARTERY BYPASS GRAFT  2008  . EYE SURGERY    . ICD IMPLANT    . MOHS SURGERY     Right Arm  . NEPHRECTOMY Right      Medications:  Outpatient Encounter Medications as of 04/01/2019  Medication Sig  . acetaminophen (TYLENOL) 500 MG tablet Take 500 mg by mouth every 6 (six) hours as needed.  . Alpha-Lipoic Acid 600 MG CAPS Take 1 capsule by mouth daily.  Marland Kitchen amiodarone (PACERONE) 200 MG tablet Take 2 tablets (400 mg total) by mouth daily.  . Ascorbic Acid (VITAMIN C) 100 MG tablet Take 100 mg by mouth 2 (two) times daily.  Marland Kitchen atorvastatin  (LIPITOR) 40 MG tablet TAKE 1 TABLET BY MOUTH ONCE DAILY. *NEEDS APPOINTMENT FOR REFILLS*  . b complex vitamins capsule Take 1 capsule by mouth daily.  . carvedilol (COREG) 25 MG tablet TAKE 1 TABLET BY MOUTH TWICE DAILY WITH MEALS.  Marland Kitchen Cholecalciferol (VITAMIN D3 PO) Take 400 mg by mouth daily.   . clopidogrel (PLAVIX) 75 MG tablet Take 1 tablet (75 mg total) by mouth daily.  . Coenzyme Q10 (CO Q 10 PO) Take 100 mg by mouth daily.  . Cranberry 400 MG CAPS Take 400 mg by mouth 2 (two) times daily.  . DULoxetine (CYMBALTA) 30 MG capsule Take 30 mg by mouth daily.  . ergocalciferol (VITAMIN D2) 1.25 MG (50000 UT) capsule TAKE 1 CAPSULE BY MOUTH ONCE WEEKLY.  . fenofibrate 160 MG tablet Take 1 tablet (160 mg total) by mouth daily.  . furosemide (LASIX) 40 MG tablet TAKE 1 TABLET BY MOUTH TWICE(2) DAILY  . Garlic [id number] MG TABS Take 500 mg by mouth daily.  . insulin detemir (LEVEMIR) 100 UNIT/ML injection Inject 6 Units into the skin daily.   . isosorbide mononitrate (IMDUR) 60 MG 24 hr tablet Take 1 tablet (60 mg total) by mouth daily.  Marland Kitchen levothyroxine (SYNTHROID, LEVOTHROID) 88 MCG tablet Take 88 mcg by mouth daily.  Marland Kitchen LORazepam (ATIVAN) 0.5 MG tablet Take 0.5 mg by mouth at bedtime as needed.  . metFORMIN (GLUCOPHAGE) 1000 MG tablet Take 500 mg by mouth 2 (two) times daily with a meal.   . Multiple Vitamin (MULTIVITAMIN) capsule Take 1 capsule by mouth daily.  . nitroGLYCERIN (NITROSTAT) 0.4 MG SL tablet Place 1 tablet (0.4 mg total) under the tongue every 5 (five) minutes as needed for chest pain.  . sacubitril-valsartan (ENTRESTO) 97-103 MG Take 1 tablet by mouth 2 (two) times daily.  . Zinc 50 MG TABS Take 1 tablet by mouth daily.  Marland Kitchen doxycycline (VIBRAMYCIN) 100 MG capsule Take 100 mg by mouth 2 (two) times daily.  . metFORMIN (GLUCOPHAGE-XR) 500 MG 24 hr tablet Take 1,000 mg by mouth 2 (two) times daily.   No facility-administered encounter medications on file as of 04/01/2019.     Allergies:  Allergies  Allergen Reactions  . Codeine Rash    Family History: Family History  Problem Relation Age of Onset  . Heart disease Mother   . Breast cancer Mother   . Heart disease Father   . Cancer Daughter   . COPD Brother     Social History: Social History   Tobacco Use  . Smoking status: Former Smoker    Packs/day: 1.00    Years: 38.00    Pack years: 38.00    Types: Cigarettes    Quit date: 04/04/2006    Years since quitting: 13.0  .  Smokeless tobacco: Never Used  Substance Use Topics  . Alcohol use: No    Alcohol/week: 0.0 standard drinks    Comment: Rarely  . Drug use: No   Social History   Social History Narrative   Right handed   Two story home    Review of Systems:  CONSTITUTIONAL: No fevers, chills, night sweats, or weight loss.   EYES: No visual changes or eye pain ENT: No hearing changes.  No history of nose bleeds.   RESPIRATORY: No cough, wheezing and shortness of breath.   CARDIOVASCULAR: Negative for chest pain, and palpitations.   GI: Negative for abdominal discomfort, blood in stools or black stools.  No recent change in bowel habits.   GU:  No history of incontinence.   MUSCLOSKELETAL: No history of joint pain or swelling.  No myalgias.   SKIN: Negative for lesions, rash, and itching.   HEMATOLOGY/ONCOLOGY: Negative for prolonged bleeding, bruising easily, and swollen nodes.  No history of cancer.   ENDOCRINE: Negative for cold or heat intolerance, polydipsia or goiter.   PSYCH:  No depression or anxiety symptoms.   NEURO: As Above.  Vital Signs:  BP (!) 145/72   Pulse 74   Ht 5\' 8"  (1.727 m)   Wt 234 lb (106.1 kg)   SpO2 95%   BMI 35.58 kg/m    General Medical Exam:   General:  Well appearing, comfortable.   Eyes/ENT: see cranial nerve examination.   Neck:   No carotid bruits. Respiratory:  Clear to auscultation, good air entry bilaterally.   Cardiac:  Regular rate and rhythm, no murmur.   Extremities:  No  deformities, edema, or skin discoloration.  Skin:  No rashes or lesions.  Neurological Exam: MENTAL STATUS including orientation to time, place, person, recent and remote memory, attention span and concentration, language, and fund of knowledge is normal.  Speech is not dysarthric.  CRANIAL NERVES: II:  No visual field defects. III-IV-VI: Pupils equal round and reactive to light.  Normal conjugate, extra-ocular eye movements in all directions of gaze.  No nystagmus.  No ptosis.   V:  Normal facial sensation.    VII:  Normal facial symmetry and movements.   VIII:  Normal hearing and vestibular function.   IX-X:  Normal palatal movement.   XI:  Normal shoulder shrug and head rotation.   XII:  Normal tongue strength and range of motion, no deviation or fasciculation.  MOTOR:  No atrophy, fasciculations or abnormal movements.  No pronator drift.   Upper Extremity:  Right  Left  Deltoid  5/5   5/5   Biceps  5/5   5/5   Triceps  5/5   5/5   Infraspinatus 5/5  5/5  Medial pectoralis 5/5  5/5  Wrist extensors  5/5   5/5   Wrist flexors  5/5   5/5   Finger extensors  5/5   5/5   Finger flexors  5/5   5/5   Dorsal interossei  5/5   5/5   Abductor pollicis  5/5   5/5   Tone (Ashworth scale)  0  0   Lower Extremity:  Right  Left  Hip flexors  5/5   5/5   Hip extensors  5/5   5/5   Adductor 5/5  5/5  Abductor 5/5  5/5  Knee flexors  5/5   5/5   Knee extensors  5/5   5/5   Dorsiflexors  5-/5   5-/5   Plantarflexors  5-/5   5-/5   Toe extensors  4/5   4/5   Toe flexors  4/5   4/5   Tone (Ashworth scale)  0  0   MSRs:  Right        Left                  brachioradialis 1+  1+  biceps 1+  1+  triceps 1+  1+  patellar 1+  1+  ankle jerk 0  0  Hoffman no  no  plantar response down  down   SENSORY: Vibration is reduced to 50% at the ankles and 30% at the great toe bilaterally.  Temperature is reduced over the dorsum of the feet.  Pin prick is intact throughout.  Rhomberg testing is  positive.  COORDINATION/GAIT: Normal finger-to- nose-finger.  Intact rapid alternating movements bilaterally.  Gait appears mildly wide-based, slow, and cautious. Right leg is externally rotated (old childhood injury).  He is unable to stand on heels or toes.    IMPRESSION: Peripheral neuropathy, contributed by diabetes.  Patient informed that neuropathy can occur even in very-well controlled diabetics.  On exam, he has distal sensory loss, weakness, and sensory ataxia.  I had extensive discussion with the patient regarding the pathogenesis, etiology, management, and natural course of neuropathy. Neuropathy tends to be slowly progressive.   I discussed that in the vast majority of cases management is symptomatic.     PLAN/RECOMMENDATIONS:  NCS/EMG of the left arm and leg to assess for length-dependent process vs overlapping entrapment neuropathy At this time, he is mostly bothered by numbness, for which there is no effective treatment.  His painful paresthesias are sporadic occurring about twice per week and responsive to frankincense oil, which he can continue.  Unless his pain becomes more frequent, I do not favor starting a daily medication such as gabapentin. I have asked him to continue his home exercises for balance and feet strengthening. Patient educated on daily foot inspection, fall prevention, and safety precautions around the home.  Start using a cane at all times, especially on uneven ground Encouraged him to continue excellent diabetes management as he is already doing  Return to clinic in 9 months.   Thank you for allowing me to participate in patient's care.  If I can answer any additional questions, I would be pleased to do so.    Sincerely,    Atiya Yera K. Posey Pronto, DO

## 2019-04-01 NOTE — Patient Instructions (Addendum)
Nerve testing of the left arm and leg  Always use a cane, especially on uneven ground  Check your feet daily  Start using a shower chair  If the pain in your feet gets worse, please contact my office  Return to clinic in 9 months, or sooner as needed

## 2019-04-11 DIAGNOSIS — I5022 Chronic systolic (congestive) heart failure: Secondary | ICD-10-CM | POA: Diagnosis not present

## 2019-04-11 DIAGNOSIS — Z79899 Other long term (current) drug therapy: Secondary | ICD-10-CM | POA: Diagnosis not present

## 2019-04-11 DIAGNOSIS — R0602 Shortness of breath: Secondary | ICD-10-CM | POA: Diagnosis not present

## 2019-04-29 ENCOUNTER — Ambulatory Visit (INDEPENDENT_AMBULATORY_CARE_PROVIDER_SITE_OTHER): Payer: Medicare Other

## 2019-04-29 ENCOUNTER — Other Ambulatory Visit: Payer: Self-pay

## 2019-04-29 DIAGNOSIS — I5042 Chronic combined systolic (congestive) and diastolic (congestive) heart failure: Secondary | ICD-10-CM

## 2019-04-29 DIAGNOSIS — E1022 Type 1 diabetes mellitus with diabetic chronic kidney disease: Secondary | ICD-10-CM | POA: Diagnosis not present

## 2019-04-29 DIAGNOSIS — I25119 Atherosclerotic heart disease of native coronary artery with unspecified angina pectoris: Secondary | ICD-10-CM | POA: Diagnosis not present

## 2019-04-29 DIAGNOSIS — I483 Typical atrial flutter: Secondary | ICD-10-CM

## 2019-04-29 DIAGNOSIS — N183 Chronic kidney disease, stage 3 unspecified: Secondary | ICD-10-CM

## 2019-04-29 DIAGNOSIS — I472 Ventricular tachycardia: Secondary | ICD-10-CM | POA: Diagnosis not present

## 2019-04-29 DIAGNOSIS — I5022 Chronic systolic (congestive) heart failure: Secondary | ICD-10-CM | POA: Diagnosis not present

## 2019-04-29 DIAGNOSIS — Z79899 Other long term (current) drug therapy: Secondary | ICD-10-CM

## 2019-04-29 DIAGNOSIS — E782 Mixed hyperlipidemia: Secondary | ICD-10-CM

## 2019-04-29 DIAGNOSIS — I110 Hypertensive heart disease with heart failure: Secondary | ICD-10-CM

## 2019-04-29 NOTE — Progress Notes (Signed)
2D Echocardiogram has been performed. IV access attempted for contrast but unable to obtain. [place]

## 2019-05-01 ENCOUNTER — Telehealth: Payer: Self-pay | Admitting: *Deleted

## 2019-05-01 DIAGNOSIS — I110 Hypertensive heart disease with heart failure: Secondary | ICD-10-CM

## 2019-05-01 DIAGNOSIS — I25119 Atherosclerotic heart disease of native coronary artery with unspecified angina pectoris: Secondary | ICD-10-CM

## 2019-05-01 DIAGNOSIS — Z95810 Presence of automatic (implantable) cardiac defibrillator: Secondary | ICD-10-CM

## 2019-05-01 DIAGNOSIS — I5022 Chronic systolic (congestive) heart failure: Secondary | ICD-10-CM

## 2019-05-01 DIAGNOSIS — I472 Ventricular tachycardia, unspecified: Secondary | ICD-10-CM

## 2019-05-01 NOTE — Telephone Encounter (Signed)
Patient notified of results and recommendations from Dr Bettina Gavia.  He would like to proceed with cardiac MRI.  I told him he would be contacted with appointment information once it has been scheduled. Last creatinine was 2.04 (in care everywhere) on 1/8.  Patient aware he will need BMP drawn in [place] office within 14 days of MRI.  Patient had medtronic ICD placed in 02/2014.  Followed by [facility]. Will forward to Dr Bettina Gavia to see if OK to proceed with cardiac MRI.

## 2019-05-01 NOTE — Telephone Encounter (Signed)
-----   Message from Richardo Priest, MD sent at 04/30/2019 12:30 PM EST ----- I read his cardiac echo, he did tell him to have another test done but I would like him to have a cardiac MR structural with contrast at [facility] to better define the volume of his heart the ejection fraction and the extent of scar.

## 2019-05-02 ENCOUNTER — Telehealth: Payer: Self-pay | Admitting: Cardiology

## 2019-05-02 NOTE — Telephone Encounter (Signed)
Patient calling to request his echo results be sent to Dr. Ola Spurr at [facility].

## 2019-05-02 NOTE — Telephone Encounter (Signed)
Echo report faxed through Epic to Dr Ola Spurr

## 2019-05-02 NOTE — Telephone Encounter (Signed)
I phoned James [name], [place] EP his ICD is MR-compatible.

## 2019-05-13 ENCOUNTER — Telehealth: Payer: Self-pay | Admitting: Cardiology

## 2019-05-13 NOTE — Telephone Encounter (Signed)
Appeals department faxing paperwork to be filled out and returned upon completion./cy

## 2019-05-13 NOTE — Telephone Encounter (Signed)
Pt's claim number [id number] and fax [phone]

## 2019-05-13 NOTE — Telephone Encounter (Signed)
Patient   Pt c/o medication issue:  1. Name of Medication: sacubitril-valsartan (ENTRESTO) 97-103 MG  2. How are you currently taking this medication (dosage and times per day)? As directed   3. Are you having a reaction (difficulty breathing--STAT)? Financial issues  4. What is your medication issue? Patient states after his insurance the medication will cost him ~$600/ mo. He says his insurance would cover it if a prior authorization was completed. He has been taking the medication for a couple years, but this is the first time he's ran into this issue.  The patient was given a number to call for a prior authorization [phone] ;[place])

## 2019-05-15 NOTE — Telephone Encounter (Signed)
Follow up    Patient is calling back in reference to the prior authorization. He states that he spoke with insurance and they have denied the prior authorization so he will need something else.

## 2019-05-15 NOTE — Telephone Encounter (Signed)
I just cannot comprehend this he has coronary disease a severe cardiomyopathy and has heart failure New York Heart Association class II EF less than 35.  None of this makes any sense.  If they will not renew Entresto he could take valsartan 80 mg twice daily but is not nearly as effective.  Please be sure this information was given to his insurance company

## 2019-05-16 DIAGNOSIS — Z4502 Encounter for adjustment and management of automatic implantable cardiac defibrillator: Secondary | ICD-10-CM | POA: Diagnosis not present

## 2019-05-16 DIAGNOSIS — I214 Non-ST elevation (NSTEMI) myocardial infarction: Secondary | ICD-10-CM | POA: Diagnosis not present

## 2019-05-16 NOTE — Telephone Encounter (Signed)
Patient following up.

## 2019-05-20 NOTE — Telephone Encounter (Signed)
Telephone call to patient insurance. Apparently they have him down as having angioedema with this medication. They are faxing new paperwork to rectify the situation and will be faxed back to insurance company. Called patient and left message updating him on the situation.

## 2019-05-29 ENCOUNTER — Telehealth (HOSPITAL_COMMUNITY): Payer: Self-pay | Admitting: Emergency Medicine

## 2019-05-29 NOTE — Telephone Encounter (Signed)
Left message on voicemail with name and callback number Marchia Bond RN [place] [facility] [phone] Office [phone] Cell   Telephone encounter from [place] phoned Nigel Sloop, [place] EP his ICD is MR-compatible. 05/02/19 14:33

## 2019-05-30 ENCOUNTER — Ambulatory Visit (HOSPITAL_COMMUNITY): Admission: RE | Admit: 2019-05-30 | Payer: Medicare Other | Source: Ambulatory Visit

## 2019-05-30 ENCOUNTER — Telehealth (HOSPITAL_COMMUNITY): Payer: Self-pay | Admitting: Emergency Medicine

## 2019-05-30 DIAGNOSIS — G629 Polyneuropathy, unspecified: Secondary | ICD-10-CM | POA: Diagnosis not present

## 2019-05-30 DIAGNOSIS — R270 Ataxia, unspecified: Secondary | ICD-10-CM | POA: Diagnosis not present

## 2019-05-30 NOTE — Telephone Encounter (Signed)
Pt calling stating he cannot come in for his cMR appt today. Will forward to schedulers.  Marchia Bond RN Navigator Cardiac Imaging [facility] [phone] Office  [phone] Cell

## 2019-06-04 DIAGNOSIS — N183 Chronic kidney disease, stage 3 unspecified: Secondary | ICD-10-CM | POA: Diagnosis not present

## 2019-06-04 DIAGNOSIS — E1022 Type 1 diabetes mellitus with diabetic chronic kidney disease: Secondary | ICD-10-CM | POA: Diagnosis not present

## 2019-06-04 DIAGNOSIS — R7989 Other specified abnormal findings of blood chemistry: Secondary | ICD-10-CM | POA: Diagnosis not present

## 2019-06-04 DIAGNOSIS — M9080 Osteopathy in diseases classified elsewhere, unspecified site: Secondary | ICD-10-CM | POA: Diagnosis not present

## 2019-06-04 DIAGNOSIS — I129 Hypertensive chronic kidney disease with stage 1 through stage 4 chronic kidney disease, or unspecified chronic kidney disease: Secondary | ICD-10-CM | POA: Diagnosis not present

## 2019-06-04 DIAGNOSIS — E875 Hyperkalemia: Secondary | ICD-10-CM | POA: Diagnosis not present

## 2019-06-04 DIAGNOSIS — E889 Metabolic disorder, unspecified: Secondary | ICD-10-CM | POA: Diagnosis not present

## 2019-06-04 DIAGNOSIS — N1831 Chronic kidney disease, stage 3a: Secondary | ICD-10-CM | POA: Diagnosis not present

## 2019-06-04 DIAGNOSIS — E559 Vitamin D deficiency, unspecified: Secondary | ICD-10-CM | POA: Diagnosis not present

## 2019-06-04 DIAGNOSIS — D631 Anemia in chronic kidney disease: Secondary | ICD-10-CM | POA: Diagnosis not present

## 2019-06-18 DIAGNOSIS — M545 Low back pain: Secondary | ICD-10-CM | POA: Diagnosis not present

## 2019-06-21 DIAGNOSIS — E1121 Type 2 diabetes mellitus with diabetic nephropathy: Secondary | ICD-10-CM | POA: Diagnosis not present

## 2019-06-21 DIAGNOSIS — Z1331 Encounter for screening for depression: Secondary | ICD-10-CM | POA: Diagnosis not present

## 2019-06-21 DIAGNOSIS — Z79899 Other long term (current) drug therapy: Secondary | ICD-10-CM | POA: Diagnosis not present

## 2019-06-21 DIAGNOSIS — Z6835 Body mass index (BMI) 35.0-35.9, adult: Secondary | ICD-10-CM | POA: Diagnosis not present

## 2019-06-21 DIAGNOSIS — E785 Hyperlipidemia, unspecified: Secondary | ICD-10-CM | POA: Diagnosis not present

## 2019-06-21 DIAGNOSIS — I25119 Atherosclerotic heart disease of native coronary artery with unspecified angina pectoris: Secondary | ICD-10-CM | POA: Diagnosis not present

## 2019-06-21 DIAGNOSIS — I110 Hypertensive heart disease with heart failure: Secondary | ICD-10-CM | POA: Diagnosis not present

## 2019-06-21 DIAGNOSIS — I5022 Chronic systolic (congestive) heart failure: Secondary | ICD-10-CM | POA: Diagnosis not present

## 2019-06-21 DIAGNOSIS — M545 Low back pain: Secondary | ICD-10-CM | POA: Diagnosis not present

## 2019-06-21 DIAGNOSIS — N1832 Chronic kidney disease, stage 3b: Secondary | ICD-10-CM | POA: Diagnosis not present

## 2019-06-21 DIAGNOSIS — E039 Hypothyroidism, unspecified: Secondary | ICD-10-CM | POA: Diagnosis not present

## 2019-06-25 DIAGNOSIS — E1122 Type 2 diabetes mellitus with diabetic chronic kidney disease: Secondary | ICD-10-CM | POA: Diagnosis present

## 2019-06-25 DIAGNOSIS — Z87891 Personal history of nicotine dependence: Secondary | ICD-10-CM | POA: Diagnosis not present

## 2019-06-25 DIAGNOSIS — E1142 Type 2 diabetes mellitus with diabetic polyneuropathy: Secondary | ICD-10-CM | POA: Diagnosis present

## 2019-06-25 DIAGNOSIS — M5442 Lumbago with sciatica, left side: Secondary | ICD-10-CM | POA: Diagnosis not present

## 2019-06-25 DIAGNOSIS — I5022 Chronic systolic (congestive) heart failure: Secondary | ICD-10-CM | POA: Diagnosis present

## 2019-06-25 DIAGNOSIS — N179 Acute kidney failure, unspecified: Secondary | ICD-10-CM | POA: Diagnosis present

## 2019-06-25 DIAGNOSIS — R220 Localized swelling, mass and lump, head: Secondary | ICD-10-CM | POA: Diagnosis not present

## 2019-06-25 DIAGNOSIS — T424X5A Adverse effect of benzodiazepines, initial encounter: Secondary | ICD-10-CM | POA: Diagnosis present

## 2019-06-25 DIAGNOSIS — J449 Chronic obstructive pulmonary disease, unspecified: Secondary | ICD-10-CM | POA: Diagnosis present

## 2019-06-25 DIAGNOSIS — R410 Disorientation, unspecified: Secondary | ICD-10-CM | POA: Diagnosis not present

## 2019-06-25 DIAGNOSIS — Z8679 Personal history of other diseases of the circulatory system: Secondary | ICD-10-CM | POA: Diagnosis not present

## 2019-06-25 DIAGNOSIS — E876 Hypokalemia: Secondary | ICD-10-CM | POA: Diagnosis present

## 2019-06-25 DIAGNOSIS — I10 Essential (primary) hypertension: Secondary | ICD-10-CM | POA: Diagnosis not present

## 2019-06-25 DIAGNOSIS — E119 Type 2 diabetes mellitus without complications: Secondary | ICD-10-CM | POA: Diagnosis not present

## 2019-06-25 DIAGNOSIS — E860 Dehydration: Secondary | ICD-10-CM | POA: Diagnosis present

## 2019-06-25 DIAGNOSIS — Z7401 Bed confinement status: Secondary | ICD-10-CM | POA: Diagnosis not present

## 2019-06-25 DIAGNOSIS — T428X1A Poisoning by antiparkinsonism drugs and other central muscle-tone depressants, accidental (unintentional), initial encounter: Secondary | ICD-10-CM | POA: Diagnosis not present

## 2019-06-25 DIAGNOSIS — T428X5A Adverse effect of antiparkinsonism drugs and other central muscle-tone depressants, initial encounter: Secondary | ICD-10-CM | POA: Diagnosis present

## 2019-06-25 DIAGNOSIS — N2889 Other specified disorders of kidney and ureter: Secondary | ICD-10-CM | POA: Diagnosis not present

## 2019-06-25 DIAGNOSIS — W19XXXA Unspecified fall, initial encounter: Secondary | ICD-10-CM | POA: Diagnosis not present

## 2019-06-25 DIAGNOSIS — M545 Low back pain: Secondary | ICD-10-CM | POA: Diagnosis not present

## 2019-06-25 DIAGNOSIS — Z95810 Presence of automatic (implantable) cardiac defibrillator: Secondary | ICD-10-CM | POA: Diagnosis not present

## 2019-06-25 DIAGNOSIS — M2550 Pain in unspecified joint: Secondary | ICD-10-CM | POA: Diagnosis not present

## 2019-06-25 DIAGNOSIS — Z905 Acquired absence of kidney: Secondary | ICD-10-CM | POA: Diagnosis not present

## 2019-06-25 DIAGNOSIS — J690 Pneumonitis due to inhalation of food and vomit: Secondary | ICD-10-CM | POA: Diagnosis present

## 2019-06-25 DIAGNOSIS — E1165 Type 2 diabetes mellitus with hyperglycemia: Secondary | ICD-10-CM | POA: Diagnosis present

## 2019-06-25 DIAGNOSIS — R918 Other nonspecific abnormal finding of lung field: Secondary | ICD-10-CM | POA: Diagnosis not present

## 2019-06-25 DIAGNOSIS — I361 Nonrheumatic tricuspid (valve) insufficiency: Secondary | ICD-10-CM | POA: Diagnosis not present

## 2019-06-25 DIAGNOSIS — I130 Hypertensive heart and chronic kidney disease with heart failure and stage 1 through stage 4 chronic kidney disease, or unspecified chronic kidney disease: Secondary | ICD-10-CM | POA: Diagnosis not present

## 2019-06-25 DIAGNOSIS — R4182 Altered mental status, unspecified: Secondary | ICD-10-CM | POA: Diagnosis not present

## 2019-06-25 DIAGNOSIS — I4891 Unspecified atrial fibrillation: Secondary | ICD-10-CM | POA: Diagnosis present

## 2019-06-25 DIAGNOSIS — M5441 Lumbago with sciatica, right side: Secondary | ICD-10-CM | POA: Diagnosis not present

## 2019-06-25 DIAGNOSIS — R0902 Hypoxemia: Secondary | ICD-10-CM | POA: Diagnosis present

## 2019-06-25 DIAGNOSIS — R748 Abnormal levels of other serum enzymes: Secondary | ICD-10-CM | POA: Diagnosis not present

## 2019-06-25 DIAGNOSIS — E7800 Pure hypercholesterolemia, unspecified: Secondary | ICD-10-CM | POA: Diagnosis not present

## 2019-06-25 DIAGNOSIS — N183 Chronic kidney disease, stage 3 unspecified: Secondary | ICD-10-CM | POA: Diagnosis present

## 2019-06-25 DIAGNOSIS — E039 Hypothyroidism, unspecified: Secondary | ICD-10-CM | POA: Diagnosis present

## 2019-06-25 DIAGNOSIS — S0990XA Unspecified injury of head, initial encounter: Secondary | ICD-10-CM | POA: Diagnosis not present

## 2019-06-25 DIAGNOSIS — T50905D Adverse effect of unspecified drugs, medicaments and biological substances, subsequent encounter: Secondary | ICD-10-CM | POA: Diagnosis not present

## 2019-06-25 DIAGNOSIS — G9341 Metabolic encephalopathy: Secondary | ICD-10-CM | POA: Diagnosis not present

## 2019-06-25 DIAGNOSIS — I252 Old myocardial infarction: Secondary | ICD-10-CM | POA: Diagnosis not present

## 2019-06-25 DIAGNOSIS — R0689 Other abnormalities of breathing: Secondary | ICD-10-CM | POA: Diagnosis present

## 2019-06-25 DIAGNOSIS — M48061 Spinal stenosis, lumbar region without neurogenic claudication: Secondary | ICD-10-CM | POA: Diagnosis present

## 2019-06-25 DIAGNOSIS — I2510 Atherosclerotic heart disease of native coronary artery without angina pectoris: Secondary | ICD-10-CM | POA: Diagnosis present

## 2019-06-25 DIAGNOSIS — K828 Other specified diseases of gallbladder: Secondary | ICD-10-CM | POA: Diagnosis not present

## 2019-06-25 DIAGNOSIS — R404 Transient alteration of awareness: Secondary | ICD-10-CM | POA: Diagnosis not present

## 2019-06-25 DIAGNOSIS — R0989 Other specified symptoms and signs involving the circulatory and respiratory systems: Secondary | ICD-10-CM | POA: Diagnosis not present

## 2019-06-25 DIAGNOSIS — E785 Hyperlipidemia, unspecified: Secondary | ICD-10-CM | POA: Diagnosis present

## 2019-06-25 DIAGNOSIS — M5136 Other intervertebral disc degeneration, lumbar region: Secondary | ICD-10-CM | POA: Diagnosis present

## 2019-06-26 DIAGNOSIS — N179 Acute kidney failure, unspecified: Secondary | ICD-10-CM | POA: Diagnosis not present

## 2019-06-26 DIAGNOSIS — G9341 Metabolic encephalopathy: Secondary | ICD-10-CM | POA: Diagnosis not present

## 2019-06-26 DIAGNOSIS — T428X1A Poisoning by antiparkinsonism drugs and other central muscle-tone depressants, accidental (unintentional), initial encounter: Secondary | ICD-10-CM | POA: Diagnosis not present

## 2019-06-26 DIAGNOSIS — I5022 Chronic systolic (congestive) heart failure: Secondary | ICD-10-CM | POA: Diagnosis not present

## 2019-06-26 DIAGNOSIS — E1122 Type 2 diabetes mellitus with diabetic chronic kidney disease: Secondary | ICD-10-CM | POA: Diagnosis not present

## 2019-06-27 DIAGNOSIS — I5022 Chronic systolic (congestive) heart failure: Secondary | ICD-10-CM | POA: Diagnosis not present

## 2019-06-27 DIAGNOSIS — N179 Acute kidney failure, unspecified: Secondary | ICD-10-CM | POA: Diagnosis not present

## 2019-06-27 DIAGNOSIS — E1122 Type 2 diabetes mellitus with diabetic chronic kidney disease: Secondary | ICD-10-CM | POA: Diagnosis not present

## 2019-06-27 DIAGNOSIS — T428X1A Poisoning by antiparkinsonism drugs and other central muscle-tone depressants, accidental (unintentional), initial encounter: Secondary | ICD-10-CM | POA: Diagnosis not present

## 2019-06-28 ENCOUNTER — Other Ambulatory Visit: Payer: Self-pay | Admitting: Cardiology

## 2019-06-28 DIAGNOSIS — E1122 Type 2 diabetes mellitus with diabetic chronic kidney disease: Secondary | ICD-10-CM | POA: Diagnosis not present

## 2019-06-28 DIAGNOSIS — T428X1A Poisoning by antiparkinsonism drugs and other central muscle-tone depressants, accidental (unintentional), initial encounter: Secondary | ICD-10-CM | POA: Diagnosis not present

## 2019-06-28 DIAGNOSIS — N179 Acute kidney failure, unspecified: Secondary | ICD-10-CM | POA: Diagnosis not present

## 2019-06-28 DIAGNOSIS — I5022 Chronic systolic (congestive) heart failure: Secondary | ICD-10-CM | POA: Diagnosis not present

## 2019-06-29 DIAGNOSIS — I361 Nonrheumatic tricuspid (valve) insufficiency: Secondary | ICD-10-CM | POA: Diagnosis not present

## 2019-06-29 DIAGNOSIS — I5022 Chronic systolic (congestive) heart failure: Secondary | ICD-10-CM | POA: Diagnosis not present

## 2019-06-29 DIAGNOSIS — E1122 Type 2 diabetes mellitus with diabetic chronic kidney disease: Secondary | ICD-10-CM | POA: Diagnosis not present

## 2019-06-29 DIAGNOSIS — T428X1A Poisoning by antiparkinsonism drugs and other central muscle-tone depressants, accidental (unintentional), initial encounter: Secondary | ICD-10-CM | POA: Diagnosis not present

## 2019-06-29 DIAGNOSIS — N179 Acute kidney failure, unspecified: Secondary | ICD-10-CM | POA: Diagnosis not present

## 2019-06-30 DIAGNOSIS — N179 Acute kidney failure, unspecified: Secondary | ICD-10-CM | POA: Diagnosis not present

## 2019-06-30 DIAGNOSIS — I5022 Chronic systolic (congestive) heart failure: Secondary | ICD-10-CM | POA: Diagnosis not present

## 2019-06-30 DIAGNOSIS — T428X1A Poisoning by antiparkinsonism drugs and other central muscle-tone depressants, accidental (unintentional), initial encounter: Secondary | ICD-10-CM | POA: Diagnosis not present

## 2019-06-30 DIAGNOSIS — E1122 Type 2 diabetes mellitus with diabetic chronic kidney disease: Secondary | ICD-10-CM | POA: Diagnosis not present

## 2019-07-01 DIAGNOSIS — I5022 Chronic systolic (congestive) heart failure: Secondary | ICD-10-CM | POA: Diagnosis not present

## 2019-07-01 DIAGNOSIS — N179 Acute kidney failure, unspecified: Secondary | ICD-10-CM | POA: Diagnosis not present

## 2019-07-01 DIAGNOSIS — E1122 Type 2 diabetes mellitus with diabetic chronic kidney disease: Secondary | ICD-10-CM | POA: Diagnosis not present

## 2019-07-01 DIAGNOSIS — T428X1A Poisoning by antiparkinsonism drugs and other central muscle-tone depressants, accidental (unintentional), initial encounter: Secondary | ICD-10-CM | POA: Diagnosis not present

## 2019-07-02 DIAGNOSIS — R0902 Hypoxemia: Secondary | ICD-10-CM | POA: Diagnosis not present

## 2019-07-02 DIAGNOSIS — E119 Type 2 diabetes mellitus without complications: Secondary | ICD-10-CM | POA: Diagnosis not present

## 2019-07-02 DIAGNOSIS — E860 Dehydration: Secondary | ICD-10-CM | POA: Diagnosis not present

## 2019-07-02 DIAGNOSIS — Z905 Acquired absence of kidney: Secondary | ICD-10-CM | POA: Diagnosis not present

## 2019-07-02 DIAGNOSIS — E875 Hyperkalemia: Secondary | ICD-10-CM | POA: Diagnosis not present

## 2019-07-02 DIAGNOSIS — R8281 Pyuria: Secondary | ICD-10-CM | POA: Diagnosis not present

## 2019-07-02 DIAGNOSIS — G9341 Metabolic encephalopathy: Secondary | ICD-10-CM | POA: Diagnosis not present

## 2019-07-02 DIAGNOSIS — Z95810 Presence of automatic (implantable) cardiac defibrillator: Secondary | ICD-10-CM | POA: Diagnosis not present

## 2019-07-02 DIAGNOSIS — R3129 Other microscopic hematuria: Secondary | ICD-10-CM | POA: Diagnosis not present

## 2019-07-02 DIAGNOSIS — T428X1A Poisoning by antiparkinsonism drugs and other central muscle-tone depressants, accidental (unintentional), initial encounter: Secondary | ICD-10-CM | POA: Diagnosis not present

## 2019-07-02 DIAGNOSIS — I129 Hypertensive chronic kidney disease with stage 1 through stage 4 chronic kidney disease, or unspecified chronic kidney disease: Secondary | ICD-10-CM | POA: Diagnosis not present

## 2019-07-02 DIAGNOSIS — N179 Acute kidney failure, unspecified: Secondary | ICD-10-CM | POA: Diagnosis not present

## 2019-07-02 DIAGNOSIS — J9621 Acute and chronic respiratory failure with hypoxia: Secondary | ICD-10-CM | POA: Diagnosis not present

## 2019-07-02 DIAGNOSIS — M545 Low back pain: Secondary | ICD-10-CM | POA: Diagnosis not present

## 2019-07-02 DIAGNOSIS — I10 Essential (primary) hypertension: Secondary | ICD-10-CM | POA: Diagnosis not present

## 2019-07-02 DIAGNOSIS — M2550 Pain in unspecified joint: Secondary | ICD-10-CM | POA: Diagnosis not present

## 2019-07-02 DIAGNOSIS — I1310 Hypertensive heart and chronic kidney disease without heart failure, with stage 1 through stage 4 chronic kidney disease, or unspecified chronic kidney disease: Secondary | ICD-10-CM | POA: Diagnosis not present

## 2019-07-02 DIAGNOSIS — E1122 Type 2 diabetes mellitus with diabetic chronic kidney disease: Secondary | ICD-10-CM | POA: Diagnosis not present

## 2019-07-02 DIAGNOSIS — I2510 Atherosclerotic heart disease of native coronary artery without angina pectoris: Secondary | ICD-10-CM | POA: Diagnosis not present

## 2019-07-02 DIAGNOSIS — R262 Difficulty in walking, not elsewhere classified: Secondary | ICD-10-CM | POA: Diagnosis not present

## 2019-07-02 DIAGNOSIS — E7800 Pure hypercholesterolemia, unspecified: Secondary | ICD-10-CM | POA: Diagnosis not present

## 2019-07-02 DIAGNOSIS — Z8679 Personal history of other diseases of the circulatory system: Secondary | ICD-10-CM | POA: Diagnosis not present

## 2019-07-02 DIAGNOSIS — N183 Chronic kidney disease, stage 3 unspecified: Secondary | ICD-10-CM | POA: Diagnosis not present

## 2019-07-02 DIAGNOSIS — T50905D Adverse effect of unspecified drugs, medicaments and biological substances, subsequent encounter: Secondary | ICD-10-CM | POA: Diagnosis not present

## 2019-07-02 DIAGNOSIS — I5022 Chronic systolic (congestive) heart failure: Secondary | ICD-10-CM | POA: Diagnosis not present

## 2019-07-02 DIAGNOSIS — N1831 Chronic kidney disease, stage 3a: Secondary | ICD-10-CM | POA: Diagnosis not present

## 2019-07-02 DIAGNOSIS — R404 Transient alteration of awareness: Secondary | ICD-10-CM | POA: Diagnosis not present

## 2019-07-02 DIAGNOSIS — Z7401 Bed confinement status: Secondary | ICD-10-CM | POA: Diagnosis not present

## 2019-07-03 ENCOUNTER — Other Ambulatory Visit: Payer: Self-pay | Admitting: *Deleted

## 2019-07-03 NOTE — Patient Outreach (Addendum)
Screened for potential [facility] Care Management needs as a benefit of  [employer] Medicare.  Mr. Urben is currently receiving skilled therapy at [facility].  Writer attended telephonic interdisciplinary team meeting to assess for disposition needs and transition plan for resident.   Will continue to follow while member resides in SNF.    Marthenia Rolling, MSN-Ed, RN,BSN [place] Acute Care Coordinator [phone] [facility]) [phone]  (Toll free office)

## 2019-07-08 DIAGNOSIS — I5022 Chronic systolic (congestive) heart failure: Secondary | ICD-10-CM | POA: Diagnosis not present

## 2019-07-08 DIAGNOSIS — N1831 Chronic kidney disease, stage 3a: Secondary | ICD-10-CM | POA: Diagnosis not present

## 2019-07-08 DIAGNOSIS — R262 Difficulty in walking, not elsewhere classified: Secondary | ICD-10-CM | POA: Diagnosis not present

## 2019-07-08 DIAGNOSIS — J9621 Acute and chronic respiratory failure with hypoxia: Secondary | ICD-10-CM | POA: Diagnosis not present

## 2019-07-18 DIAGNOSIS — N183 Chronic kidney disease, stage 3 unspecified: Secondary | ICD-10-CM | POA: Diagnosis not present

## 2019-07-18 DIAGNOSIS — I129 Hypertensive chronic kidney disease with stage 1 through stage 4 chronic kidney disease, or unspecified chronic kidney disease: Secondary | ICD-10-CM | POA: Diagnosis not present

## 2019-07-18 DIAGNOSIS — R8281 Pyuria: Secondary | ICD-10-CM | POA: Diagnosis not present

## 2019-07-18 DIAGNOSIS — N179 Acute kidney failure, unspecified: Secondary | ICD-10-CM | POA: Diagnosis not present

## 2019-07-18 DIAGNOSIS — I1310 Hypertensive heart and chronic kidney disease without heart failure, with stage 1 through stage 4 chronic kidney disease, or unspecified chronic kidney disease: Secondary | ICD-10-CM | POA: Diagnosis not present

## 2019-07-18 DIAGNOSIS — R3129 Other microscopic hematuria: Secondary | ICD-10-CM | POA: Diagnosis not present

## 2019-07-18 DIAGNOSIS — E875 Hyperkalemia: Secondary | ICD-10-CM | POA: Diagnosis not present

## 2019-07-23 ENCOUNTER — Other Ambulatory Visit: Payer: Self-pay | Admitting: *Deleted

## 2019-07-23 NOTE — Patient Outreach (Signed)
[facility]- Acute Care Coordinator follow up  Mr. Hsieh is receiving skilled therapy at [facility].  Telephone call made to Mrs. Micciche. Patient identifiers confirmed. Mrs. Sawaya reports member will transition to home Monday, 07/29/19.  She states Mr. Korsak confusion is clearing up since his treatment for UTI.    Member lives with wife. He will have home health at dc. Mrs. Argent states member is on 80 mg Lasix in morning and 40 mg Lasix at night. States member has had multiple medication adjustments. Mrs. Zarn is agreeable to [place] referral. Also agreeable to [place] for care coordination.  Mrs. Baich and daughter also requesting tub transfer bench and 3 in 1 commode.   Sent notification to facility SW.   Will plan to make referral for [facility] RNCM closer to SNF dc.   Mr. Deso has history of AKI, cardiorenal syndrome, CKD stage III, CAD, HTN, DM, CABG, HLD, NSTEMI, ICD, CHF.   Marthenia Rolling, MSN-Ed, RN,BSN [place] Acute Care Coordinator [phone] [place]) [phone]  (Toll free office)

## 2019-07-25 ENCOUNTER — Other Ambulatory Visit: Payer: Self-pay | Admitting: *Deleted

## 2019-07-25 DIAGNOSIS — I10 Essential (primary) hypertension: Secondary | ICD-10-CM

## 2019-07-25 NOTE — Patient Outreach (Signed)
[place] Coordinator follow up  Telephone call made to Lile Go (wife) [phone]. Patient identifiers confirmed. States Mr. Ancheta will transition to home on Monday, 07/29/19. He will have [facility] home health as well.   Mrs. Navedo is still agreeable to [facility] RNCM follow up as well as [place] referral for medication review and potential interactions.  Mr. Fagg has history of AKI, cardiorenal syndrome, CKD stage III, CAD, HTN, DM, CABG, HLD, NSTEMI, ICD, CHF.  To discharge from [facility] on Monday, 07/29/19.    Marthenia Rolling, MSN-Ed, RN,BSN [place] Acute Care Coordinator [phone] [facility]) [phone]  (Toll free office)

## 2019-07-30 DIAGNOSIS — E1151 Type 2 diabetes mellitus with diabetic peripheral angiopathy without gangrene: Secondary | ICD-10-CM | POA: Diagnosis not present

## 2019-07-30 DIAGNOSIS — E1122 Type 2 diabetes mellitus with diabetic chronic kidney disease: Secondary | ICD-10-CM | POA: Diagnosis not present

## 2019-07-30 DIAGNOSIS — I5023 Acute on chronic systolic (congestive) heart failure: Secondary | ICD-10-CM | POA: Diagnosis not present

## 2019-07-30 DIAGNOSIS — I890 Lymphedema, not elsewhere classified: Secondary | ICD-10-CM | POA: Diagnosis not present

## 2019-07-30 DIAGNOSIS — Z87891 Personal history of nicotine dependence: Secondary | ICD-10-CM | POA: Diagnosis not present

## 2019-07-30 DIAGNOSIS — Z466 Encounter for fitting and adjustment of urinary device: Secondary | ICD-10-CM | POA: Diagnosis not present

## 2019-07-30 DIAGNOSIS — Z794 Long term (current) use of insulin: Secondary | ICD-10-CM | POA: Diagnosis not present

## 2019-07-30 DIAGNOSIS — J441 Chronic obstructive pulmonary disease with (acute) exacerbation: Secondary | ICD-10-CM | POA: Diagnosis not present

## 2019-07-30 DIAGNOSIS — E1140 Type 2 diabetes mellitus with diabetic neuropathy, unspecified: Secondary | ICD-10-CM | POA: Diagnosis not present

## 2019-07-30 DIAGNOSIS — I70203 Unspecified atherosclerosis of native arteries of extremities, bilateral legs: Secondary | ICD-10-CM | POA: Diagnosis not present

## 2019-07-30 DIAGNOSIS — S91102D Unspecified open wound of left great toe without damage to nail, subsequent encounter: Secondary | ICD-10-CM | POA: Diagnosis not present

## 2019-07-30 DIAGNOSIS — I130 Hypertensive heart and chronic kidney disease with heart failure and stage 1 through stage 4 chronic kidney disease, or unspecified chronic kidney disease: Secondary | ICD-10-CM | POA: Diagnosis not present

## 2019-07-30 DIAGNOSIS — I4891 Unspecified atrial fibrillation: Secondary | ICD-10-CM | POA: Diagnosis not present

## 2019-07-30 DIAGNOSIS — Z95810 Presence of automatic (implantable) cardiac defibrillator: Secondary | ICD-10-CM | POA: Diagnosis not present

## 2019-07-30 DIAGNOSIS — Z951 Presence of aortocoronary bypass graft: Secondary | ICD-10-CM | POA: Diagnosis not present

## 2019-07-30 DIAGNOSIS — R279 Unspecified lack of coordination: Secondary | ICD-10-CM | POA: Diagnosis not present

## 2019-07-30 DIAGNOSIS — I2510 Atherosclerotic heart disease of native coronary artery without angina pectoris: Secondary | ICD-10-CM | POA: Diagnosis not present

## 2019-07-30 DIAGNOSIS — N189 Chronic kidney disease, unspecified: Secondary | ICD-10-CM | POA: Diagnosis not present

## 2019-07-30 DIAGNOSIS — R339 Retention of urine, unspecified: Secondary | ICD-10-CM | POA: Diagnosis not present

## 2019-07-30 DIAGNOSIS — S91101D Unspecified open wound of right great toe without damage to nail, subsequent encounter: Secondary | ICD-10-CM | POA: Diagnosis not present

## 2019-07-31 ENCOUNTER — Other Ambulatory Visit: Payer: Self-pay

## 2019-07-31 DIAGNOSIS — N189 Chronic kidney disease, unspecified: Secondary | ICD-10-CM | POA: Diagnosis not present

## 2019-07-31 DIAGNOSIS — I5023 Acute on chronic systolic (congestive) heart failure: Secondary | ICD-10-CM | POA: Diagnosis not present

## 2019-07-31 DIAGNOSIS — I130 Hypertensive heart and chronic kidney disease with heart failure and stage 1 through stage 4 chronic kidney disease, or unspecified chronic kidney disease: Secondary | ICD-10-CM | POA: Diagnosis not present

## 2019-07-31 DIAGNOSIS — E1122 Type 2 diabetes mellitus with diabetic chronic kidney disease: Secondary | ICD-10-CM | POA: Diagnosis not present

## 2019-07-31 DIAGNOSIS — I70203 Unspecified atherosclerosis of native arteries of extremities, bilateral legs: Secondary | ICD-10-CM | POA: Diagnosis not present

## 2019-07-31 DIAGNOSIS — E1151 Type 2 diabetes mellitus with diabetic peripheral angiopathy without gangrene: Secondary | ICD-10-CM | POA: Diagnosis not present

## 2019-07-31 NOTE — Patient Outreach (Signed)
[facility]) Care Management  07/31/2019  Kailum Ouimette. 02-06-1951 [id number]   New referral for transition of care from [facility] rehab  Placed call to patient and spoke with wife, who is the point of contact.  Wife reports patient is doing well. Reports OT is with patient as we speak. Wife states she has many questions about medications and would like to speak to the [facility] pharmacist. Wife states that she is planning to changed to a [facility] doctor.  Reports husband is currently active with remote health and has a follow up cardiology appointment on 08/09/2019.    Wife reports patient is weighing daily and following low salt diet.  PLAN: will call patient and or wife back in 1 week. Will mail [facility] packet. Will place referral to [place].  Tomasa Rand, RN, BSN, CEN [facility] [employer] [phone]

## 2019-08-02 ENCOUNTER — Other Ambulatory Visit: Payer: Self-pay | Admitting: Pharmacist

## 2019-08-02 DIAGNOSIS — I5023 Acute on chronic systolic (congestive) heart failure: Secondary | ICD-10-CM | POA: Diagnosis not present

## 2019-08-02 DIAGNOSIS — I70203 Unspecified atherosclerosis of native arteries of extremities, bilateral legs: Secondary | ICD-10-CM | POA: Diagnosis not present

## 2019-08-02 DIAGNOSIS — E1122 Type 2 diabetes mellitus with diabetic chronic kidney disease: Secondary | ICD-10-CM | POA: Diagnosis not present

## 2019-08-02 DIAGNOSIS — N189 Chronic kidney disease, unspecified: Secondary | ICD-10-CM | POA: Diagnosis not present

## 2019-08-02 DIAGNOSIS — E1151 Type 2 diabetes mellitus with diabetic peripheral angiopathy without gangrene: Secondary | ICD-10-CM | POA: Diagnosis not present

## 2019-08-02 DIAGNOSIS — I130 Hypertensive heart and chronic kidney disease with heart failure and stage 1 through stage 4 chronic kidney disease, or unspecified chronic kidney disease: Secondary | ICD-10-CM | POA: Diagnosis not present

## 2019-08-02 NOTE — Patient Outreach (Signed)
[facility]  [place] Team    Patient was called regarding medication management post discharge from [facility]. Spoke with his wife, [name]. HIPAA identifiers were obtained.  Patient is a 69 year old male with multiple medical conditions including but not limited to: CKD stage III, s/p left kidney nephrectomy, HTN, type 2 diabetes, CAD status post CABG, CHF, ICD placement and atrial flutter. He was recently admitted to [facility] for and subsequently discharged to [facility].   Patient's wife is his primary caregiver and she had questions about the patient's medications because there was some discrepancies between what he was taking before being hospitalized and being discharged to a SNF.  Patient's medications were reviewed via telephone: ([place], Patient Report (with actual medication bottles), Med List from [place], and interview with Remote Health Nurse)  Medications Reviewed Today    Reviewed by Elayne Guerin, [facility] (Pharmacist) on 08/02/19 at 0955  Med List Status: <None>  Medication Order Taking? Sig Documenting Provider Last Dose Status Informant  acetaminophen (TYLENOL) 500 MG tablet [id number] Yes Take 500 mg by mouth every 6 (six) hours as needed. [provider] Taking Active   Alpha-Lipoic Acid 600 MG CAPS [id number] No Take 1 capsule by mouth daily. [provider] Not Taking Active   amiodarone (PACERONE) 200 MG tablet [id number] Yes Take 2 tablets (400 mg total) by mouth daily. Richardo Priest, MD Taking Active   Ascorbic Acid (VITAMIN C) 100 MG tablet [id number] Yes Take 100 mg by mouth 2 (two) times daily. [provider] Taking Active   atorvastatin (LIPITOR) 40 MG tablet [id number] Yes TAKE 1 TABLET BY MOUTH ONCE DAILY. *NEEDS APPOINTMENT FOR REFILLS* Richardo Priest, MD Taking Active   b complex vitamins capsule [id number] Yes Take 1 capsule by mouth daily. [provider] Taking Active    bethanechol (URECHOLINE) 25 MG tablet [id number] Yes Take 25 mg by mouth 3 (three) times daily. [provider] Taking Active   carvedilol (COREG) 12.5 MG tablet [id number] Yes Take 1 tablet by mouth in the morning and at bedtime. [provider] Taking Active   clopidogrel (PLAVIX) 75 MG tablet [id number] Yes Take 1 tablet (75 mg total) by mouth daily. Richardo Priest, MD Taking Active   Coenzyme Q10 (CO Q 10 PO) [id number] Yes Take 100 mg by mouth daily. [provider] Taking Active   Cranberry 400 MG CAPS [id number] Yes Take 400 mg by mouth 2 (two) times daily. [provider] Taking Active   ergocalciferol (VITAMIN D2) 1.25 MG (50000 UT) capsule [id number] Yes TAKE 1 CAPSULE BY MOUTH ONCE WEEKLY. [provider] Taking Active   furosemide (LASIX) 40 MG tablet [id number] Yes TAKE 1 TABLET BY MOUTH TWICE(2) DAILY Bettina Gavia, Hilton Cork, MD Taking Active   insulin detemir (LEVEMIR) 100 UNIT/ML injection [id number] Yes Inject 6 Units into the skin daily.  [provider] Taking Active   isosorbide mononitrate (IMDUR) 30 MG 24 hr tablet [id number] Yes Take 1 tablet by mouth every morning. (Hold if blood pressure under 100 sbp) [provider] Taking Active   levothyroxine (SYNTHROID, LEVOTHROID) 88 MCG tablet [id number] Yes Take 88 mcg by mouth daily. [provider] Taking Active   LORazepam (ATIVAN) 0.5 MG tablet [id number] Yes Take 0.5 mg by mouth at bedtime as needed. [provider] Taking Active   melatonin 3 MG TABS tablet [id number] Yes Take 1 tablet by mouth at bedtime. [provider] Taking Active  montelukast (SINGULAIR) 10 MG tablet [id number] Yes Take 1 tablet by mouth daily as needed for allergies. [provider] Taking Active   Multiple Vitamin (MULTIVITAMIN) capsule [id number] Yes Take 1 capsule by mouth daily. [provider] Taking Active   nitroGLYCERIN (NITROSTAT) 0.4 MG SL tablet [id number]  Yes Place 1 tablet (0.4 mg total) under the tongue every 5 (five) minutes as needed for chest pain. Richardo Priest, MD Taking Active   omega-3 acid ethyl esters (LOVAZA) 1 g capsule [id number] Yes Take 2 capsules by mouth in the morning and at bedtime. [provider] Taking Active   sacubitril-valsartan (ENTRESTO) 97-103 MG [id number] Yes Take 1 tablet by mouth 2 (two) times daily. Richardo Priest, MD Taking Active   senna (SENOKOT) 8.6 MG tablet [id number] Yes Take 1 tablet by mouth at bedtime. [provider] Taking Active   sitaGLIPtin (JANUVIA) 25 MG tablet [id number] Yes Take 1 tablet by mouth daily. [provider] Taking Active   Zinc 50 MG TABS [id number] Yes Take 1 tablet by mouth daily. [provider] Taking Active           Medication findings:  Dose discrepancies: Levemir-list from [place] listed Levemir dose as 30 units daily. Patient's wife reported his dose as 6 units daily.  Spoke with their nurse from [place], [place]. She confirmed the Levemir dose as 6 units daily.  (Medication list was updated). FBG today was 123 mg/dl   Amiodarone-[facility] medication list stated Amiodarone 200 mg 1 tablet daily, list from [place] stated Amiodarone 200 mg 1 tablet twice daily, the list from [facility] stated, Amiodarone 200 mg 2 tablets twice daily. Patient's wife stated she was giving the patient Amiodarone 200 mg 1 tablet twice daily.Nurse from [place] stated Amiodarone 200 mg 2 tablets daily. [facility] med list was updated to what the patient's wife reported giving him and Remote Health Nurse was notified.  Patient and wife stated they did not have any more questions about his medications.  Patient's wife said the patient's care is being provided by Remote [facility] and that he will no longer see Dr. Laqueta Due.  Plan: Patient's case is being closed as the [place] Team has transitioned to the Quality Department and will no longer document in  [facility] and the patient's needs have been met.  Will route note to [facility] referring Nurse.  Elayne Guerin, PharmD, [place] Clinical Pharmacist [phone]

## 2019-08-06 DIAGNOSIS — E1122 Type 2 diabetes mellitus with diabetic chronic kidney disease: Secondary | ICD-10-CM | POA: Diagnosis not present

## 2019-08-06 DIAGNOSIS — I130 Hypertensive heart and chronic kidney disease with heart failure and stage 1 through stage 4 chronic kidney disease, or unspecified chronic kidney disease: Secondary | ICD-10-CM | POA: Diagnosis not present

## 2019-08-06 DIAGNOSIS — I5023 Acute on chronic systolic (congestive) heart failure: Secondary | ICD-10-CM | POA: Diagnosis not present

## 2019-08-06 DIAGNOSIS — I70203 Unspecified atherosclerosis of native arteries of extremities, bilateral legs: Secondary | ICD-10-CM | POA: Diagnosis not present

## 2019-08-06 DIAGNOSIS — N189 Chronic kidney disease, unspecified: Secondary | ICD-10-CM | POA: Diagnosis not present

## 2019-08-06 DIAGNOSIS — E1151 Type 2 diabetes mellitus with diabetic peripheral angiopathy without gangrene: Secondary | ICD-10-CM | POA: Diagnosis not present

## 2019-08-07 ENCOUNTER — Other Ambulatory Visit: Payer: Self-pay

## 2019-08-07 NOTE — Patient Outreach (Signed)
[facility]) Care Management   08/07/2019  James [name]. [birth year] [id number]  [name] is an 69 y.o. male Telephone call with patient.  Subjective: Patient reports that he was prescribed Bacoflen and had a reaction. Reports he was found be wife laying on bathroom floor.  Patient reports he does not remember what happened to him. Reports severe gain of fluid  " 30 pounds" and severe swelling on legs. Patient reports he has a kidney removed 2019 due to a mass but this was determined not to be cancer.  Patient reports he has COPD and DM. Reports Dm under good control with last A1C of 6.  States he monitors CBG twice a day with readings 114-135 fasting and 128-144 in the evenings.  Wife manages all medications for patient and med list updated by [place].  Patient unable to review medications with me today on the phone. Patient reports biggest problem is his balance and walking. Reports he is improving with PT. Patient reports that he mostly uses a walker but when PT is working with him he can use a cane. Patient is active with remote health and is planning to switch primary Doctors.  Objective:   Review of Systems  Constitutional: Positive for fatigue.  Cardiovascular: Positive for leg swelling.   Awake and alert. Talking in complete sentences on the phone. Today's Vitals   08/07/19 1349  Weight: 228 lb (103.4 kg)  Height: 1.727 m (5\' 8" )  PainSc: 5    Physical Exam  Encounter Medications:   Outpatient Encounter Medications as of 08/07/2019  Medication Sig  . acetaminophen (TYLENOL) 500 MG tablet Take 500 mg by mouth every 6 (six) hours as needed.  . Alpha-Lipoic Acid 600 MG CAPS Take 1 capsule by mouth daily.  Marland Kitchen amiodarone (PACERONE) 200 MG tablet Take 2 tablets (400 mg total) by mouth daily. (Patient taking differently: Take 200 mg by mouth 2 (two) times daily. )  . Ascorbic Acid (VITAMIN C) 100 MG tablet Take 100 mg by mouth 2 (two) times daily.  Marland Kitchen  atorvastatin (LIPITOR) 40 MG tablet TAKE 1 TABLET BY MOUTH ONCE DAILY. *NEEDS APPOINTMENT FOR REFILLS*  . b complex vitamins capsule Take 1 capsule by mouth daily.  . bethanechol (URECHOLINE) 25 MG tablet Take 25 mg by mouth 3 (three) times daily.  . carvedilol (COREG) 12.5 MG tablet Take 1 tablet by mouth in the morning and at bedtime.  . clopidogrel (PLAVIX) 75 MG tablet Take 1 tablet (75 mg total) by mouth daily.  . Coenzyme Q10 (CO Q 10 PO) Take 100 mg by mouth daily.  . Cranberry 400 MG CAPS Take 400 mg by mouth 2 (two) times daily.  . ergocalciferol (VITAMIN D2) 1.25 MG (50000 UT) capsule TAKE 1 CAPSULE BY MOUTH ONCE WEEKLY.  . furosemide (LASIX) 40 MG tablet TAKE 1 TABLET BY MOUTH TWICE(2) DAILY  . insulin detemir (LEVEMIR) 100 UNIT/ML injection Inject 6 Units into the skin daily.   . isosorbide mononitrate (IMDUR) 30 MG 24 hr tablet Take 1 tablet by mouth every morning. (Hold if blood pressure under 100 sbp)  . levothyroxine (SYNTHROID, LEVOTHROID) 88 MCG tablet Take 88 mcg by mouth daily.  Marland Kitchen LORazepam (ATIVAN) 0.5 MG tablet Take 0.5 mg by mouth at bedtime as needed.  . melatonin 3 MG TABS tablet Take 1 tablet by mouth at bedtime.  . montelukast (SINGULAIR) 10 MG tablet Take 1 tablet by mouth daily as needed for allergies.  . Multiple  Vitamin (MULTIVITAMIN) capsule Take 1 capsule by mouth daily.  . nitroGLYCERIN (NITROSTAT) 0.4 MG SL tablet Place 1 tablet (0.4 mg total) under the tongue every 5 (five) minutes as needed for chest pain.  Marland Kitchen omega-3 acid ethyl esters (LOVAZA) 1 g capsule Take 2 capsules by mouth in the morning and at bedtime.  . sacubitril-valsartan (ENTRESTO) 97-103 MG Take 1 tablet by mouth 2 (two) times daily.  Marland Kitchen senna (SENOKOT) 8.6 MG tablet Take 1 tablet by mouth at bedtime.  . sitaGLIPtin (JANUVIA) 25 MG tablet Take 1 tablet by mouth daily.  . Zinc 50 MG TABS Take 1 tablet by mouth daily.   No facility-administered encounter medications on file as of 08/07/2019.     Functional Status:   In your present state of health, do you have any difficulty performing the following activities: 08/07/2019  Hearing? N  Vision? Y  Comment reading glass  Difficulty concentrating or making decisions? Y  Walking or climbing stairs? Y  Dressing or bathing? Y  Comment wife assist  Doing errands, shopping? Y  Preparing Food and eating ? N  Comment can make a sandwich, put stuff in microwave.  Using the Toilet? N  In the past six months, have you accidently leaked urine? N  Do you have problems with loss of bowel control? N  Managing your Medications? Y  Comment wife manages medications  Managing your Finances? Y  Comment wife Engineer, production or managing your Housekeeping? Y  Comment wife manages  Some recent data might be hidden    Fall/Depression Screening:    Fall Risk  08/07/2019 04/01/2019  Falls in the past year? 1 0  Number falls in past yr: 0 0  Injury with Fall? 1 0   PHQ 2/9 Scores 08/07/2019  PHQ - 2 Score 0    Assessment:   (1) CHF=  Recent fluid overload and was admitted. Patient reports 30 pound weight loss after admission which was deemed to be fluid. Reports he continues to have swelling in his legs and he states that he wears compression hose. Reports that he weighs daily and follows a low salt diet. (2) Dm=  Reports under good control.   Reports takes medications as prescribed and monitors twice a day. Follow DM deit. (3) weakness=  Reviewed importance of daily PT and home exercises to improve strength. Reviewed fall precautions (4) COPD=  Patient reports under good control. (5) wants to get a new Primary MD. States currently using remote health as PCP  Plan:  (1) Reviewed heart failure zones via phone. Reviewed importance of daily weights and when to call MD for weight gain. Encouraged patient to continue to self monitor weight and follow low salt diet. (2) Reviewed recent CBG's levels. And patient is doing quite well as self  monitoring (3) reviewed importance of home exercises and being as active as possible. (4) reviewed when to call MD. (5) encouraged patient to get establish with PCP as soon as possible and requested records to be moved.  THN CM Care Plan Problem One     Most Recent Value  Care Plan Problem One  Recent discharge from rehab for CHF exacerbation  Role Documenting the Problem One  Care Management [place] for Problem One  Active  THN Long Term Goal   Patient and or wife will report no readmission for the next 60 days.   THN Long Term Goal Start Date  07/31/19  Interventions for Problem One Long Term Goal  Reviewed current weights, CBG and BP. Encouraged patient to call cardiology for weight gain. Encouraged patient to arrange new PCP as soon as possible  THN CM Short Term Goal #1   Patient will weigh and record weights daily for the next 30 days.   THN CM Short Term Goal #1 Start Date  07/31/19  Interventions for Short Term Goal #1  Reviwed current weght trend. Reviewed when to call MD.   [facility] CM Short Term Goal #2   Wife and or patient will report following up with cardiology as planned in the enxt 2 week.  THN CM Short Term Goal #2 Start Date  07/31/19  Interventions for Short Term Goal #2  Reviewed importance of timely follow up . Paitent states understanding.       Next follow up planned in 1 week via phone.  Tomasa Rand, RN, BSN, CEN [facility] [employer] [phone]

## 2019-08-08 DIAGNOSIS — I130 Hypertensive heart and chronic kidney disease with heart failure and stage 1 through stage 4 chronic kidney disease, or unspecified chronic kidney disease: Secondary | ICD-10-CM | POA: Diagnosis not present

## 2019-08-08 DIAGNOSIS — I70203 Unspecified atherosclerosis of native arteries of extremities, bilateral legs: Secondary | ICD-10-CM | POA: Diagnosis not present

## 2019-08-08 DIAGNOSIS — E1151 Type 2 diabetes mellitus with diabetic peripheral angiopathy without gangrene: Secondary | ICD-10-CM | POA: Diagnosis not present

## 2019-08-08 DIAGNOSIS — N189 Chronic kidney disease, unspecified: Secondary | ICD-10-CM | POA: Diagnosis not present

## 2019-08-08 DIAGNOSIS — I5023 Acute on chronic systolic (congestive) heart failure: Secondary | ICD-10-CM | POA: Diagnosis not present

## 2019-08-08 DIAGNOSIS — E1122 Type 2 diabetes mellitus with diabetic chronic kidney disease: Secondary | ICD-10-CM | POA: Diagnosis not present

## 2019-08-09 DIAGNOSIS — N390 Urinary tract infection, site not specified: Secondary | ICD-10-CM | POA: Diagnosis not present

## 2019-08-13 DIAGNOSIS — I130 Hypertensive heart and chronic kidney disease with heart failure and stage 1 through stage 4 chronic kidney disease, or unspecified chronic kidney disease: Secondary | ICD-10-CM | POA: Diagnosis not present

## 2019-08-13 DIAGNOSIS — N189 Chronic kidney disease, unspecified: Secondary | ICD-10-CM | POA: Diagnosis not present

## 2019-08-13 DIAGNOSIS — E1122 Type 2 diabetes mellitus with diabetic chronic kidney disease: Secondary | ICD-10-CM | POA: Diagnosis not present

## 2019-08-13 DIAGNOSIS — I70203 Unspecified atherosclerosis of native arteries of extremities, bilateral legs: Secondary | ICD-10-CM | POA: Diagnosis not present

## 2019-08-13 DIAGNOSIS — E1151 Type 2 diabetes mellitus with diabetic peripheral angiopathy without gangrene: Secondary | ICD-10-CM | POA: Diagnosis not present

## 2019-08-13 DIAGNOSIS — I5023 Acute on chronic systolic (congestive) heart failure: Secondary | ICD-10-CM | POA: Diagnosis not present

## 2019-08-14 ENCOUNTER — Other Ambulatory Visit: Payer: Self-pay

## 2019-08-14 DIAGNOSIS — E1022 Type 1 diabetes mellitus with diabetic chronic kidney disease: Secondary | ICD-10-CM | POA: Diagnosis not present

## 2019-08-14 DIAGNOSIS — I70203 Unspecified atherosclerosis of native arteries of extremities, bilateral legs: Secondary | ICD-10-CM | POA: Diagnosis not present

## 2019-08-14 DIAGNOSIS — E1151 Type 2 diabetes mellitus with diabetic peripheral angiopathy without gangrene: Secondary | ICD-10-CM | POA: Diagnosis not present

## 2019-08-14 DIAGNOSIS — E1122 Type 2 diabetes mellitus with diabetic chronic kidney disease: Secondary | ICD-10-CM | POA: Diagnosis not present

## 2019-08-14 DIAGNOSIS — N189 Chronic kidney disease, unspecified: Secondary | ICD-10-CM | POA: Diagnosis not present

## 2019-08-14 DIAGNOSIS — I130 Hypertensive heart and chronic kidney disease with heart failure and stage 1 through stage 4 chronic kidney disease, or unspecified chronic kidney disease: Secondary | ICD-10-CM | POA: Diagnosis not present

## 2019-08-14 DIAGNOSIS — I5022 Chronic systolic (congestive) heart failure: Secondary | ICD-10-CM | POA: Diagnosis not present

## 2019-08-14 DIAGNOSIS — I110 Hypertensive heart disease with heart failure: Secondary | ICD-10-CM | POA: Diagnosis not present

## 2019-08-14 DIAGNOSIS — I5023 Acute on chronic systolic (congestive) heart failure: Secondary | ICD-10-CM | POA: Diagnosis not present

## 2019-08-15 DIAGNOSIS — E1122 Type 2 diabetes mellitus with diabetic chronic kidney disease: Secondary | ICD-10-CM | POA: Diagnosis not present

## 2019-08-15 DIAGNOSIS — N189 Chronic kidney disease, unspecified: Secondary | ICD-10-CM | POA: Diagnosis not present

## 2019-08-15 DIAGNOSIS — I5023 Acute on chronic systolic (congestive) heart failure: Secondary | ICD-10-CM | POA: Diagnosis not present

## 2019-08-15 DIAGNOSIS — I130 Hypertensive heart and chronic kidney disease with heart failure and stage 1 through stage 4 chronic kidney disease, or unspecified chronic kidney disease: Secondary | ICD-10-CM | POA: Diagnosis not present

## 2019-08-15 DIAGNOSIS — E1151 Type 2 diabetes mellitus with diabetic peripheral angiopathy without gangrene: Secondary | ICD-10-CM | POA: Diagnosis not present

## 2019-08-15 DIAGNOSIS — I70203 Unspecified atherosclerosis of native arteries of extremities, bilateral legs: Secondary | ICD-10-CM | POA: Diagnosis not present

## 2019-08-16 DIAGNOSIS — I5022 Chronic systolic (congestive) heart failure: Secondary | ICD-10-CM | POA: Diagnosis not present

## 2019-08-16 DIAGNOSIS — I498 Other specified cardiac arrhythmias: Secondary | ICD-10-CM | POA: Diagnosis not present

## 2019-08-16 DIAGNOSIS — I472 Ventricular tachycardia: Secondary | ICD-10-CM | POA: Diagnosis not present

## 2019-08-16 DIAGNOSIS — E1022 Type 1 diabetes mellitus with diabetic chronic kidney disease: Secondary | ICD-10-CM | POA: Diagnosis not present

## 2019-08-16 DIAGNOSIS — N183 Chronic kidney disease, stage 3 unspecified: Secondary | ICD-10-CM | POA: Diagnosis not present

## 2019-08-16 DIAGNOSIS — I25810 Atherosclerosis of coronary artery bypass graft(s) without angina pectoris: Secondary | ICD-10-CM | POA: Diagnosis not present

## 2019-08-16 DIAGNOSIS — Z95810 Presence of automatic (implantable) cardiac defibrillator: Secondary | ICD-10-CM | POA: Diagnosis not present

## 2019-08-16 DIAGNOSIS — I129 Hypertensive chronic kidney disease with stage 1 through stage 4 chronic kidney disease, or unspecified chronic kidney disease: Secondary | ICD-10-CM | POA: Diagnosis not present

## 2019-08-20 DIAGNOSIS — E1122 Type 2 diabetes mellitus with diabetic chronic kidney disease: Secondary | ICD-10-CM | POA: Diagnosis not present

## 2019-08-20 DIAGNOSIS — E1151 Type 2 diabetes mellitus with diabetic peripheral angiopathy without gangrene: Secondary | ICD-10-CM | POA: Diagnosis not present

## 2019-08-20 DIAGNOSIS — N189 Chronic kidney disease, unspecified: Secondary | ICD-10-CM | POA: Diagnosis not present

## 2019-08-20 DIAGNOSIS — I5023 Acute on chronic systolic (congestive) heart failure: Secondary | ICD-10-CM | POA: Diagnosis not present

## 2019-08-20 DIAGNOSIS — I70203 Unspecified atherosclerosis of native arteries of extremities, bilateral legs: Secondary | ICD-10-CM | POA: Diagnosis not present

## 2019-08-20 DIAGNOSIS — I130 Hypertensive heart and chronic kidney disease with heart failure and stage 1 through stage 4 chronic kidney disease, or unspecified chronic kidney disease: Secondary | ICD-10-CM | POA: Diagnosis not present

## 2019-08-21 ENCOUNTER — Other Ambulatory Visit: Payer: Self-pay

## 2019-08-21 DIAGNOSIS — R339 Retention of urine, unspecified: Secondary | ICD-10-CM | POA: Diagnosis not present

## 2019-08-21 DIAGNOSIS — I4892 Unspecified atrial flutter: Secondary | ICD-10-CM | POA: Diagnosis not present

## 2019-08-21 DIAGNOSIS — Z6835 Body mass index (BMI) 35.0-35.9, adult: Secondary | ICD-10-CM | POA: Diagnosis not present

## 2019-08-21 DIAGNOSIS — E119 Type 2 diabetes mellitus without complications: Secondary | ICD-10-CM | POA: Diagnosis not present

## 2019-08-21 DIAGNOSIS — I5022 Chronic systolic (congestive) heart failure: Secondary | ICD-10-CM | POA: Diagnosis not present

## 2019-08-21 DIAGNOSIS — R5381 Other malaise: Secondary | ICD-10-CM | POA: Diagnosis not present

## 2019-08-21 DIAGNOSIS — N183 Chronic kidney disease, stage 3 unspecified: Secondary | ICD-10-CM | POA: Diagnosis not present

## 2019-08-21 NOTE — Patient Outreach (Signed)
[facility]) Care Management  08/21/2019  James [name]. April 13, 1950 [id number]   Transition of care:  Placed call to patient with no answer. Return call from patient.  Patient reports that he went to establish care with new primary MD, Dr. Mellody Memos.  Reports visit went well. Patient reports no swelling and no changes in weight. Reports he continues to have back pain.  Reports CBG 100-150.  Denies any additional concerns today. Continues to take medication as directed, follow diet and weigh daily.  PLAN: transition of care call in 1 week.  Tomasa Rand, RN, BSN, CEN [facility] [employer] [phone]

## 2019-08-22 DIAGNOSIS — N189 Chronic kidney disease, unspecified: Secondary | ICD-10-CM | POA: Diagnosis not present

## 2019-08-22 DIAGNOSIS — E1122 Type 2 diabetes mellitus with diabetic chronic kidney disease: Secondary | ICD-10-CM | POA: Diagnosis not present

## 2019-08-22 DIAGNOSIS — E1151 Type 2 diabetes mellitus with diabetic peripheral angiopathy without gangrene: Secondary | ICD-10-CM | POA: Diagnosis not present

## 2019-08-22 DIAGNOSIS — I130 Hypertensive heart and chronic kidney disease with heart failure and stage 1 through stage 4 chronic kidney disease, or unspecified chronic kidney disease: Secondary | ICD-10-CM | POA: Diagnosis not present

## 2019-08-22 DIAGNOSIS — I5023 Acute on chronic systolic (congestive) heart failure: Secondary | ICD-10-CM | POA: Diagnosis not present

## 2019-08-22 DIAGNOSIS — I70203 Unspecified atherosclerosis of native arteries of extremities, bilateral legs: Secondary | ICD-10-CM | POA: Diagnosis not present

## 2019-08-26 DIAGNOSIS — N189 Chronic kidney disease, unspecified: Secondary | ICD-10-CM | POA: Diagnosis not present

## 2019-08-26 DIAGNOSIS — E1122 Type 2 diabetes mellitus with diabetic chronic kidney disease: Secondary | ICD-10-CM | POA: Diagnosis not present

## 2019-08-26 DIAGNOSIS — I70203 Unspecified atherosclerosis of native arteries of extremities, bilateral legs: Secondary | ICD-10-CM | POA: Diagnosis not present

## 2019-08-26 DIAGNOSIS — E1151 Type 2 diabetes mellitus with diabetic peripheral angiopathy without gangrene: Secondary | ICD-10-CM | POA: Diagnosis not present

## 2019-08-26 DIAGNOSIS — I130 Hypertensive heart and chronic kidney disease with heart failure and stage 1 through stage 4 chronic kidney disease, or unspecified chronic kidney disease: Secondary | ICD-10-CM | POA: Diagnosis not present

## 2019-08-26 DIAGNOSIS — I5023 Acute on chronic systolic (congestive) heart failure: Secondary | ICD-10-CM | POA: Diagnosis not present

## 2019-08-27 DIAGNOSIS — D3001 Benign neoplasm of right kidney: Secondary | ICD-10-CM | POA: Diagnosis not present

## 2019-08-27 DIAGNOSIS — R8290 Unspecified abnormal findings in urine: Secondary | ICD-10-CM | POA: Diagnosis not present

## 2019-08-27 DIAGNOSIS — R339 Retention of urine, unspecified: Secondary | ICD-10-CM | POA: Diagnosis not present

## 2019-08-28 ENCOUNTER — Other Ambulatory Visit: Payer: Self-pay

## 2019-08-28 DIAGNOSIS — R918 Other nonspecific abnormal finding of lung field: Secondary | ICD-10-CM | POA: Diagnosis not present

## 2019-08-28 DIAGNOSIS — I959 Hypotension, unspecified: Secondary | ICD-10-CM | POA: Diagnosis not present

## 2019-08-28 DIAGNOSIS — R609 Edema, unspecified: Secondary | ICD-10-CM | POA: Diagnosis not present

## 2019-08-28 NOTE — Patient Outreach (Signed)
[facility]) Care Management  08/28/2019  Shaya Machon. 1951/01/04 [id number]   Transition of care: Placed call to patient who states that he weight is stable and his CBG today of 134.  Reports he feels bad. States he is dark, cloudy urine. Reports saw urologist yesterday and UA was collected and awaiting a culture.  Patient became symptomatic last night with weakness in feet and fell.  Denies fever.    Reviewed concern for quick onset of severe infection. Wife states she has called remote health and will call urologist. I reviewed option to call new Primary MD.  Reviewed importance of prompt attention to avoid hospital admission.  Patient reports he can not walk right now and does not feel like going to MD office.  I reviewed my concern and suggested ED visit or primary MD visit asap.  Patient and wife voiced understanding and will wait for remote health to call them back.  PLAN: follow up in 1 week. Reviewed concerns and offered suggestions for urgent medical attention.  Tomasa Rand, RN, BSN, CEN [facility] [employer] [phone]

## 2019-08-29 DIAGNOSIS — I959 Hypotension, unspecified: Secondary | ICD-10-CM | POA: Diagnosis present

## 2019-08-29 DIAGNOSIS — N183 Chronic kidney disease, stage 3 unspecified: Secondary | ICD-10-CM | POA: Diagnosis present

## 2019-08-29 DIAGNOSIS — A419 Sepsis, unspecified organism: Secondary | ICD-10-CM | POA: Diagnosis not present

## 2019-08-29 DIAGNOSIS — R339 Retention of urine, unspecified: Secondary | ICD-10-CM | POA: Diagnosis present

## 2019-08-29 DIAGNOSIS — I482 Chronic atrial fibrillation, unspecified: Secondary | ICD-10-CM | POA: Diagnosis present

## 2019-08-29 DIAGNOSIS — J449 Chronic obstructive pulmonary disease, unspecified: Secondary | ICD-10-CM | POA: Diagnosis present

## 2019-08-29 DIAGNOSIS — T83511A Infection and inflammatory reaction due to indwelling urethral catheter, initial encounter: Secondary | ICD-10-CM | POA: Diagnosis not present

## 2019-08-29 DIAGNOSIS — B961 Klebsiella pneumoniae [K. pneumoniae] as the cause of diseases classified elsewhere: Secondary | ICD-10-CM | POA: Diagnosis present

## 2019-08-29 DIAGNOSIS — Z950 Presence of cardiac pacemaker: Secondary | ICD-10-CM | POA: Diagnosis not present

## 2019-08-29 DIAGNOSIS — I5022 Chronic systolic (congestive) heart failure: Secondary | ICD-10-CM | POA: Diagnosis not present

## 2019-08-29 DIAGNOSIS — M5442 Lumbago with sciatica, left side: Secondary | ICD-10-CM | POA: Diagnosis present

## 2019-08-29 DIAGNOSIS — Z951 Presence of aortocoronary bypass graft: Secondary | ICD-10-CM | POA: Diagnosis not present

## 2019-08-29 DIAGNOSIS — N179 Acute kidney failure, unspecified: Secondary | ICD-10-CM | POA: Diagnosis present

## 2019-08-29 DIAGNOSIS — R918 Other nonspecific abnormal finding of lung field: Secondary | ICD-10-CM | POA: Diagnosis not present

## 2019-08-29 DIAGNOSIS — E039 Hypothyroidism, unspecified: Secondary | ICD-10-CM | POA: Diagnosis present

## 2019-08-29 DIAGNOSIS — M545 Low back pain: Secondary | ICD-10-CM | POA: Diagnosis not present

## 2019-08-29 DIAGNOSIS — M1990 Unspecified osteoarthritis, unspecified site: Secondary | ICD-10-CM | POA: Diagnosis present

## 2019-08-29 DIAGNOSIS — R7989 Other specified abnormal findings of blood chemistry: Secondary | ICD-10-CM | POA: Diagnosis present

## 2019-08-29 DIAGNOSIS — E1129 Type 2 diabetes mellitus with other diabetic kidney complication: Secondary | ICD-10-CM | POA: Diagnosis not present

## 2019-08-29 DIAGNOSIS — Z794 Long term (current) use of insulin: Secondary | ICD-10-CM | POA: Diagnosis not present

## 2019-08-29 DIAGNOSIS — A4189 Other specified sepsis: Secondary | ICD-10-CM | POA: Diagnosis present

## 2019-08-29 DIAGNOSIS — N390 Urinary tract infection, site not specified: Secondary | ICD-10-CM | POA: Diagnosis present

## 2019-08-29 DIAGNOSIS — E1122 Type 2 diabetes mellitus with diabetic chronic kidney disease: Secondary | ICD-10-CM | POA: Diagnosis present

## 2019-08-29 DIAGNOSIS — G8929 Other chronic pain: Secondary | ICD-10-CM | POA: Diagnosis present

## 2019-08-29 DIAGNOSIS — I130 Hypertensive heart and chronic kidney disease with heart failure and stage 1 through stage 4 chronic kidney disease, or unspecified chronic kidney disease: Secondary | ICD-10-CM | POA: Diagnosis not present

## 2019-08-29 DIAGNOSIS — M5441 Lumbago with sciatica, right side: Secondary | ICD-10-CM | POA: Diagnosis present

## 2019-08-29 DIAGNOSIS — E785 Hyperlipidemia, unspecified: Secondary | ICD-10-CM | POA: Diagnosis present

## 2019-08-29 DIAGNOSIS — R109 Unspecified abdominal pain: Secondary | ICD-10-CM | POA: Diagnosis not present

## 2019-08-29 DIAGNOSIS — I2510 Atherosclerotic heart disease of native coronary artery without angina pectoris: Secondary | ICD-10-CM | POA: Diagnosis present

## 2019-08-29 DIAGNOSIS — D72829 Elevated white blood cell count, unspecified: Secondary | ICD-10-CM | POA: Diagnosis not present

## 2019-08-30 DIAGNOSIS — I959 Hypotension, unspecified: Secondary | ICD-10-CM

## 2019-09-06 DIAGNOSIS — E1022 Type 1 diabetes mellitus with diabetic chronic kidney disease: Secondary | ICD-10-CM | POA: Diagnosis not present

## 2019-09-06 DIAGNOSIS — I5022 Chronic systolic (congestive) heart failure: Secondary | ICD-10-CM | POA: Diagnosis not present

## 2019-09-06 DIAGNOSIS — I110 Hypertensive heart disease with heart failure: Secondary | ICD-10-CM | POA: Diagnosis not present

## 2019-09-09 DIAGNOSIS — R339 Retention of urine, unspecified: Secondary | ICD-10-CM | POA: Diagnosis not present

## 2019-09-09 DIAGNOSIS — D3001 Benign neoplasm of right kidney: Secondary | ICD-10-CM | POA: Diagnosis not present

## 2019-09-11 DIAGNOSIS — I4892 Unspecified atrial flutter: Secondary | ICD-10-CM | POA: Diagnosis not present

## 2019-09-11 DIAGNOSIS — I110 Hypertensive heart disease with heart failure: Secondary | ICD-10-CM | POA: Diagnosis not present

## 2019-09-11 DIAGNOSIS — I5022 Chronic systolic (congestive) heart failure: Secondary | ICD-10-CM | POA: Diagnosis not present

## 2019-09-11 DIAGNOSIS — N183 Chronic kidney disease, stage 3 unspecified: Secondary | ICD-10-CM | POA: Diagnosis not present

## 2019-09-11 DIAGNOSIS — E119 Type 2 diabetes mellitus without complications: Secondary | ICD-10-CM | POA: Diagnosis not present

## 2019-09-11 DIAGNOSIS — E875 Hyperkalemia: Secondary | ICD-10-CM | POA: Diagnosis not present

## 2019-09-28 DIAGNOSIS — R339 Retention of urine, unspecified: Secondary | ICD-10-CM | POA: Diagnosis not present

## 2019-09-28 DIAGNOSIS — I5023 Acute on chronic systolic (congestive) heart failure: Secondary | ICD-10-CM | POA: Diagnosis not present

## 2019-09-28 DIAGNOSIS — I70203 Unspecified atherosclerosis of native arteries of extremities, bilateral legs: Secondary | ICD-10-CM | POA: Diagnosis not present

## 2019-09-28 DIAGNOSIS — I4891 Unspecified atrial fibrillation: Secondary | ICD-10-CM | POA: Diagnosis not present

## 2019-09-28 DIAGNOSIS — Z95810 Presence of automatic (implantable) cardiac defibrillator: Secondary | ICD-10-CM | POA: Diagnosis not present

## 2019-09-28 DIAGNOSIS — E1122 Type 2 diabetes mellitus with diabetic chronic kidney disease: Secondary | ICD-10-CM | POA: Diagnosis not present

## 2019-09-28 DIAGNOSIS — R279 Unspecified lack of coordination: Secondary | ICD-10-CM | POA: Diagnosis not present

## 2019-09-28 DIAGNOSIS — E1140 Type 2 diabetes mellitus with diabetic neuropathy, unspecified: Secondary | ICD-10-CM | POA: Diagnosis not present

## 2019-09-28 DIAGNOSIS — I130 Hypertensive heart and chronic kidney disease with heart failure and stage 1 through stage 4 chronic kidney disease, or unspecified chronic kidney disease: Secondary | ICD-10-CM | POA: Diagnosis not present

## 2019-09-28 DIAGNOSIS — Z87891 Personal history of nicotine dependence: Secondary | ICD-10-CM | POA: Diagnosis not present

## 2019-09-28 DIAGNOSIS — J441 Chronic obstructive pulmonary disease with (acute) exacerbation: Secondary | ICD-10-CM | POA: Diagnosis not present

## 2019-09-28 DIAGNOSIS — M6281 Muscle weakness (generalized): Secondary | ICD-10-CM | POA: Diagnosis not present

## 2019-09-28 DIAGNOSIS — Z794 Long term (current) use of insulin: Secondary | ICD-10-CM | POA: Diagnosis not present

## 2019-09-28 DIAGNOSIS — Z951 Presence of aortocoronary bypass graft: Secondary | ICD-10-CM | POA: Diagnosis not present

## 2019-09-28 DIAGNOSIS — N183 Chronic kidney disease, stage 3 unspecified: Secondary | ICD-10-CM | POA: Diagnosis not present

## 2019-09-28 DIAGNOSIS — I890 Lymphedema, not elsewhere classified: Secondary | ICD-10-CM | POA: Diagnosis not present

## 2019-09-28 DIAGNOSIS — I2510 Atherosclerotic heart disease of native coronary artery without angina pectoris: Secondary | ICD-10-CM | POA: Diagnosis not present

## 2019-09-28 DIAGNOSIS — Z466 Encounter for fitting and adjustment of urinary device: Secondary | ICD-10-CM | POA: Diagnosis not present

## 2019-10-02 DIAGNOSIS — I130 Hypertensive heart and chronic kidney disease with heart failure and stage 1 through stage 4 chronic kidney disease, or unspecified chronic kidney disease: Secondary | ICD-10-CM | POA: Diagnosis not present

## 2019-10-02 DIAGNOSIS — N183 Chronic kidney disease, stage 3 unspecified: Secondary | ICD-10-CM | POA: Diagnosis not present

## 2019-10-02 DIAGNOSIS — M6281 Muscle weakness (generalized): Secondary | ICD-10-CM | POA: Diagnosis not present

## 2019-10-02 DIAGNOSIS — Z466 Encounter for fitting and adjustment of urinary device: Secondary | ICD-10-CM | POA: Diagnosis not present

## 2019-10-02 DIAGNOSIS — I5023 Acute on chronic systolic (congestive) heart failure: Secondary | ICD-10-CM | POA: Diagnosis not present

## 2019-10-02 DIAGNOSIS — R339 Retention of urine, unspecified: Secondary | ICD-10-CM | POA: Diagnosis not present

## 2019-10-04 DIAGNOSIS — I130 Hypertensive heart and chronic kidney disease with heart failure and stage 1 through stage 4 chronic kidney disease, or unspecified chronic kidney disease: Secondary | ICD-10-CM | POA: Diagnosis not present

## 2019-10-04 DIAGNOSIS — N183 Chronic kidney disease, stage 3 unspecified: Secondary | ICD-10-CM | POA: Diagnosis not present

## 2019-10-04 DIAGNOSIS — R339 Retention of urine, unspecified: Secondary | ICD-10-CM | POA: Diagnosis not present

## 2019-10-04 DIAGNOSIS — M6281 Muscle weakness (generalized): Secondary | ICD-10-CM | POA: Diagnosis not present

## 2019-10-04 DIAGNOSIS — Z466 Encounter for fitting and adjustment of urinary device: Secondary | ICD-10-CM | POA: Diagnosis not present

## 2019-10-04 DIAGNOSIS — I5023 Acute on chronic systolic (congestive) heart failure: Secondary | ICD-10-CM | POA: Diagnosis not present

## 2019-10-09 DIAGNOSIS — M6281 Muscle weakness (generalized): Secondary | ICD-10-CM | POA: Diagnosis not present

## 2019-10-09 DIAGNOSIS — N183 Chronic kidney disease, stage 3 unspecified: Secondary | ICD-10-CM | POA: Diagnosis not present

## 2019-10-09 DIAGNOSIS — I130 Hypertensive heart and chronic kidney disease with heart failure and stage 1 through stage 4 chronic kidney disease, or unspecified chronic kidney disease: Secondary | ICD-10-CM | POA: Diagnosis not present

## 2019-10-09 DIAGNOSIS — R339 Retention of urine, unspecified: Secondary | ICD-10-CM | POA: Diagnosis not present

## 2019-10-09 DIAGNOSIS — Z466 Encounter for fitting and adjustment of urinary device: Secondary | ICD-10-CM | POA: Diagnosis not present

## 2019-10-09 DIAGNOSIS — I5023 Acute on chronic systolic (congestive) heart failure: Secondary | ICD-10-CM | POA: Diagnosis not present

## 2019-10-11 DIAGNOSIS — I5023 Acute on chronic systolic (congestive) heart failure: Secondary | ICD-10-CM | POA: Diagnosis not present

## 2019-10-11 DIAGNOSIS — Z466 Encounter for fitting and adjustment of urinary device: Secondary | ICD-10-CM | POA: Diagnosis not present

## 2019-10-11 DIAGNOSIS — R339 Retention of urine, unspecified: Secondary | ICD-10-CM | POA: Diagnosis not present

## 2019-10-11 DIAGNOSIS — N183 Chronic kidney disease, stage 3 unspecified: Secondary | ICD-10-CM | POA: Diagnosis not present

## 2019-10-11 DIAGNOSIS — M6281 Muscle weakness (generalized): Secondary | ICD-10-CM | POA: Diagnosis not present

## 2019-10-11 DIAGNOSIS — I130 Hypertensive heart and chronic kidney disease with heart failure and stage 1 through stage 4 chronic kidney disease, or unspecified chronic kidney disease: Secondary | ICD-10-CM | POA: Diagnosis not present

## 2019-10-14 DIAGNOSIS — I130 Hypertensive heart and chronic kidney disease with heart failure and stage 1 through stage 4 chronic kidney disease, or unspecified chronic kidney disease: Secondary | ICD-10-CM | POA: Diagnosis not present

## 2019-10-14 DIAGNOSIS — G894 Chronic pain syndrome: Secondary | ICD-10-CM | POA: Diagnosis not present

## 2019-10-14 DIAGNOSIS — E119 Type 2 diabetes mellitus without complications: Secondary | ICD-10-CM | POA: Diagnosis not present

## 2019-10-14 DIAGNOSIS — M5126 Other intervertebral disc displacement, lumbar region: Secondary | ICD-10-CM | POA: Diagnosis not present

## 2019-10-14 DIAGNOSIS — R339 Retention of urine, unspecified: Secondary | ICD-10-CM | POA: Diagnosis not present

## 2019-10-14 DIAGNOSIS — I10 Essential (primary) hypertension: Secondary | ICD-10-CM | POA: Diagnosis not present

## 2019-10-14 DIAGNOSIS — Z466 Encounter for fitting and adjustment of urinary device: Secondary | ICD-10-CM | POA: Diagnosis not present

## 2019-10-14 DIAGNOSIS — N183 Chronic kidney disease, stage 3 unspecified: Secondary | ICD-10-CM | POA: Diagnosis not present

## 2019-10-14 DIAGNOSIS — I5023 Acute on chronic systolic (congestive) heart failure: Secondary | ICD-10-CM | POA: Diagnosis not present

## 2019-10-14 DIAGNOSIS — M6281 Muscle weakness (generalized): Secondary | ICD-10-CM | POA: Diagnosis not present

## 2019-10-14 DIAGNOSIS — N1831 Chronic kidney disease, stage 3a: Secondary | ICD-10-CM | POA: Diagnosis not present

## 2019-10-15 DIAGNOSIS — N401 Enlarged prostate with lower urinary tract symptoms: Secondary | ICD-10-CM | POA: Diagnosis not present

## 2019-10-15 DIAGNOSIS — D3001 Benign neoplasm of right kidney: Secondary | ICD-10-CM | POA: Diagnosis not present

## 2019-10-15 DIAGNOSIS — R339 Retention of urine, unspecified: Secondary | ICD-10-CM | POA: Diagnosis not present

## 2019-10-15 DIAGNOSIS — N138 Other obstructive and reflux uropathy: Secondary | ICD-10-CM | POA: Diagnosis not present

## 2019-10-15 DIAGNOSIS — R972 Elevated prostate specific antigen [PSA]: Secondary | ICD-10-CM | POA: Diagnosis not present

## 2019-10-16 DIAGNOSIS — N183 Chronic kidney disease, stage 3 unspecified: Secondary | ICD-10-CM | POA: Diagnosis not present

## 2019-10-16 DIAGNOSIS — R339 Retention of urine, unspecified: Secondary | ICD-10-CM | POA: Diagnosis not present

## 2019-10-16 DIAGNOSIS — M6281 Muscle weakness (generalized): Secondary | ICD-10-CM | POA: Diagnosis not present

## 2019-10-16 DIAGNOSIS — Z466 Encounter for fitting and adjustment of urinary device: Secondary | ICD-10-CM | POA: Diagnosis not present

## 2019-10-16 DIAGNOSIS — I5023 Acute on chronic systolic (congestive) heart failure: Secondary | ICD-10-CM | POA: Diagnosis not present

## 2019-10-16 DIAGNOSIS — I130 Hypertensive heart and chronic kidney disease with heart failure and stage 1 through stage 4 chronic kidney disease, or unspecified chronic kidney disease: Secondary | ICD-10-CM | POA: Diagnosis not present

## 2019-10-21 DIAGNOSIS — I5023 Acute on chronic systolic (congestive) heart failure: Secondary | ICD-10-CM | POA: Diagnosis not present

## 2019-10-21 DIAGNOSIS — M19012 Primary osteoarthritis, left shoulder: Secondary | ICD-10-CM | POA: Diagnosis not present

## 2019-10-21 DIAGNOSIS — I130 Hypertensive heart and chronic kidney disease with heart failure and stage 1 through stage 4 chronic kidney disease, or unspecified chronic kidney disease: Secondary | ICD-10-CM | POA: Diagnosis not present

## 2019-10-21 DIAGNOSIS — R339 Retention of urine, unspecified: Secondary | ICD-10-CM | POA: Diagnosis not present

## 2019-10-21 DIAGNOSIS — M6281 Muscle weakness (generalized): Secondary | ICD-10-CM | POA: Diagnosis not present

## 2019-10-21 DIAGNOSIS — Z466 Encounter for fitting and adjustment of urinary device: Secondary | ICD-10-CM | POA: Diagnosis not present

## 2019-10-21 DIAGNOSIS — N183 Chronic kidney disease, stage 3 unspecified: Secondary | ICD-10-CM | POA: Diagnosis not present

## 2019-10-21 DIAGNOSIS — M25512 Pain in left shoulder: Secondary | ICD-10-CM | POA: Diagnosis not present

## 2019-10-25 DIAGNOSIS — N183 Chronic kidney disease, stage 3 unspecified: Secondary | ICD-10-CM | POA: Diagnosis not present

## 2019-10-25 DIAGNOSIS — I130 Hypertensive heart and chronic kidney disease with heart failure and stage 1 through stage 4 chronic kidney disease, or unspecified chronic kidney disease: Secondary | ICD-10-CM | POA: Diagnosis not present

## 2019-10-25 DIAGNOSIS — M6281 Muscle weakness (generalized): Secondary | ICD-10-CM | POA: Diagnosis not present

## 2019-10-25 DIAGNOSIS — R339 Retention of urine, unspecified: Secondary | ICD-10-CM | POA: Diagnosis not present

## 2019-10-25 DIAGNOSIS — I5023 Acute on chronic systolic (congestive) heart failure: Secondary | ICD-10-CM | POA: Diagnosis not present

## 2019-10-25 DIAGNOSIS — Z466 Encounter for fitting and adjustment of urinary device: Secondary | ICD-10-CM | POA: Diagnosis not present

## 2019-10-28 DIAGNOSIS — Z951 Presence of aortocoronary bypass graft: Secondary | ICD-10-CM | POA: Diagnosis not present

## 2019-10-28 DIAGNOSIS — M6281 Muscle weakness (generalized): Secondary | ICD-10-CM | POA: Diagnosis not present

## 2019-10-28 DIAGNOSIS — J441 Chronic obstructive pulmonary disease with (acute) exacerbation: Secondary | ICD-10-CM | POA: Diagnosis not present

## 2019-10-28 DIAGNOSIS — R339 Retention of urine, unspecified: Secondary | ICD-10-CM | POA: Diagnosis not present

## 2019-10-28 DIAGNOSIS — E1122 Type 2 diabetes mellitus with diabetic chronic kidney disease: Secondary | ICD-10-CM | POA: Diagnosis not present

## 2019-10-28 DIAGNOSIS — I2510 Atherosclerotic heart disease of native coronary artery without angina pectoris: Secondary | ICD-10-CM | POA: Diagnosis not present

## 2019-10-28 DIAGNOSIS — R279 Unspecified lack of coordination: Secondary | ICD-10-CM | POA: Diagnosis not present

## 2019-10-28 DIAGNOSIS — E1140 Type 2 diabetes mellitus with diabetic neuropathy, unspecified: Secondary | ICD-10-CM | POA: Diagnosis not present

## 2019-10-28 DIAGNOSIS — R972 Elevated prostate specific antigen [PSA]: Secondary | ICD-10-CM | POA: Diagnosis not present

## 2019-10-28 DIAGNOSIS — I5023 Acute on chronic systolic (congestive) heart failure: Secondary | ICD-10-CM | POA: Diagnosis not present

## 2019-10-28 DIAGNOSIS — Z466 Encounter for fitting and adjustment of urinary device: Secondary | ICD-10-CM | POA: Diagnosis not present

## 2019-10-28 DIAGNOSIS — N138 Other obstructive and reflux uropathy: Secondary | ICD-10-CM | POA: Diagnosis not present

## 2019-10-28 DIAGNOSIS — I130 Hypertensive heart and chronic kidney disease with heart failure and stage 1 through stage 4 chronic kidney disease, or unspecified chronic kidney disease: Secondary | ICD-10-CM | POA: Diagnosis not present

## 2019-10-28 DIAGNOSIS — Z87891 Personal history of nicotine dependence: Secondary | ICD-10-CM | POA: Diagnosis not present

## 2019-10-28 DIAGNOSIS — Z794 Long term (current) use of insulin: Secondary | ICD-10-CM | POA: Diagnosis not present

## 2019-10-28 DIAGNOSIS — Z95810 Presence of automatic (implantable) cardiac defibrillator: Secondary | ICD-10-CM | POA: Diagnosis not present

## 2019-10-28 DIAGNOSIS — I70203 Unspecified atherosclerosis of native arteries of extremities, bilateral legs: Secondary | ICD-10-CM | POA: Diagnosis not present

## 2019-10-28 DIAGNOSIS — I890 Lymphedema, not elsewhere classified: Secondary | ICD-10-CM | POA: Diagnosis not present

## 2019-10-28 DIAGNOSIS — N183 Chronic kidney disease, stage 3 unspecified: Secondary | ICD-10-CM | POA: Diagnosis not present

## 2019-10-28 DIAGNOSIS — D3001 Benign neoplasm of right kidney: Secondary | ICD-10-CM | POA: Diagnosis not present

## 2019-10-28 DIAGNOSIS — N401 Enlarged prostate with lower urinary tract symptoms: Secondary | ICD-10-CM | POA: Diagnosis not present

## 2019-10-28 DIAGNOSIS — I4891 Unspecified atrial fibrillation: Secondary | ICD-10-CM | POA: Diagnosis not present

## 2019-10-30 DIAGNOSIS — N183 Chronic kidney disease, stage 3 unspecified: Secondary | ICD-10-CM | POA: Diagnosis not present

## 2019-10-30 DIAGNOSIS — Z466 Encounter for fitting and adjustment of urinary device: Secondary | ICD-10-CM | POA: Diagnosis not present

## 2019-10-30 DIAGNOSIS — I130 Hypertensive heart and chronic kidney disease with heart failure and stage 1 through stage 4 chronic kidney disease, or unspecified chronic kidney disease: Secondary | ICD-10-CM | POA: Diagnosis not present

## 2019-10-30 DIAGNOSIS — M6281 Muscle weakness (generalized): Secondary | ICD-10-CM | POA: Diagnosis not present

## 2019-10-30 DIAGNOSIS — R339 Retention of urine, unspecified: Secondary | ICD-10-CM | POA: Diagnosis not present

## 2019-10-30 DIAGNOSIS — I5023 Acute on chronic systolic (congestive) heart failure: Secondary | ICD-10-CM | POA: Diagnosis not present

## 2019-11-06 DIAGNOSIS — I5023 Acute on chronic systolic (congestive) heart failure: Secondary | ICD-10-CM | POA: Diagnosis not present

## 2019-11-06 DIAGNOSIS — I130 Hypertensive heart and chronic kidney disease with heart failure and stage 1 through stage 4 chronic kidney disease, or unspecified chronic kidney disease: Secondary | ICD-10-CM | POA: Diagnosis not present

## 2019-11-06 DIAGNOSIS — Z466 Encounter for fitting and adjustment of urinary device: Secondary | ICD-10-CM | POA: Diagnosis not present

## 2019-11-06 DIAGNOSIS — N183 Chronic kidney disease, stage 3 unspecified: Secondary | ICD-10-CM | POA: Diagnosis not present

## 2019-11-06 DIAGNOSIS — R339 Retention of urine, unspecified: Secondary | ICD-10-CM | POA: Diagnosis not present

## 2019-11-06 DIAGNOSIS — M6281 Muscle weakness (generalized): Secondary | ICD-10-CM | POA: Diagnosis not present

## 2019-11-18 DIAGNOSIS — I5023 Acute on chronic systolic (congestive) heart failure: Secondary | ICD-10-CM | POA: Diagnosis not present

## 2019-11-18 DIAGNOSIS — R339 Retention of urine, unspecified: Secondary | ICD-10-CM | POA: Diagnosis not present

## 2019-11-18 DIAGNOSIS — N183 Chronic kidney disease, stage 3 unspecified: Secondary | ICD-10-CM | POA: Diagnosis not present

## 2019-11-18 DIAGNOSIS — I130 Hypertensive heart and chronic kidney disease with heart failure and stage 1 through stage 4 chronic kidney disease, or unspecified chronic kidney disease: Secondary | ICD-10-CM | POA: Diagnosis not present

## 2019-11-18 DIAGNOSIS — M6281 Muscle weakness (generalized): Secondary | ICD-10-CM | POA: Diagnosis not present

## 2019-11-18 DIAGNOSIS — Z466 Encounter for fitting and adjustment of urinary device: Secondary | ICD-10-CM | POA: Diagnosis not present

## 2019-11-19 DIAGNOSIS — M48062 Spinal stenosis, lumbar region with neurogenic claudication: Secondary | ICD-10-CM | POA: Diagnosis not present

## 2019-11-20 DIAGNOSIS — R339 Retention of urine, unspecified: Secondary | ICD-10-CM | POA: Diagnosis not present

## 2019-11-20 DIAGNOSIS — I130 Hypertensive heart and chronic kidney disease with heart failure and stage 1 through stage 4 chronic kidney disease, or unspecified chronic kidney disease: Secondary | ICD-10-CM | POA: Diagnosis not present

## 2019-11-20 DIAGNOSIS — M6281 Muscle weakness (generalized): Secondary | ICD-10-CM | POA: Diagnosis not present

## 2019-11-20 DIAGNOSIS — N183 Chronic kidney disease, stage 3 unspecified: Secondary | ICD-10-CM | POA: Diagnosis not present

## 2019-11-20 DIAGNOSIS — Z466 Encounter for fitting and adjustment of urinary device: Secondary | ICD-10-CM | POA: Diagnosis not present

## 2019-11-20 DIAGNOSIS — I5023 Acute on chronic systolic (congestive) heart failure: Secondary | ICD-10-CM | POA: Diagnosis not present

## 2019-11-21 DIAGNOSIS — M6281 Muscle weakness (generalized): Secondary | ICD-10-CM | POA: Diagnosis not present

## 2019-11-21 DIAGNOSIS — I130 Hypertensive heart and chronic kidney disease with heart failure and stage 1 through stage 4 chronic kidney disease, or unspecified chronic kidney disease: Secondary | ICD-10-CM | POA: Diagnosis not present

## 2019-11-21 DIAGNOSIS — Z466 Encounter for fitting and adjustment of urinary device: Secondary | ICD-10-CM | POA: Diagnosis not present

## 2019-11-21 DIAGNOSIS — I5023 Acute on chronic systolic (congestive) heart failure: Secondary | ICD-10-CM | POA: Diagnosis not present

## 2019-11-21 DIAGNOSIS — R339 Retention of urine, unspecified: Secondary | ICD-10-CM | POA: Diagnosis not present

## 2019-11-21 DIAGNOSIS — N183 Chronic kidney disease, stage 3 unspecified: Secondary | ICD-10-CM | POA: Diagnosis not present

## 2019-11-22 DIAGNOSIS — M549 Dorsalgia, unspecified: Secondary | ICD-10-CM | POA: Diagnosis not present

## 2019-11-25 DIAGNOSIS — Z466 Encounter for fitting and adjustment of urinary device: Secondary | ICD-10-CM | POA: Diagnosis not present

## 2019-11-25 DIAGNOSIS — I130 Hypertensive heart and chronic kidney disease with heart failure and stage 1 through stage 4 chronic kidney disease, or unspecified chronic kidney disease: Secondary | ICD-10-CM | POA: Diagnosis not present

## 2019-11-25 DIAGNOSIS — R339 Retention of urine, unspecified: Secondary | ICD-10-CM | POA: Diagnosis not present

## 2019-11-25 DIAGNOSIS — I5023 Acute on chronic systolic (congestive) heart failure: Secondary | ICD-10-CM | POA: Diagnosis not present

## 2019-11-25 DIAGNOSIS — N183 Chronic kidney disease, stage 3 unspecified: Secondary | ICD-10-CM | POA: Diagnosis not present

## 2019-11-25 DIAGNOSIS — M6281 Muscle weakness (generalized): Secondary | ICD-10-CM | POA: Diagnosis not present

## 2019-11-26 ENCOUNTER — Other Ambulatory Visit: Payer: Self-pay | Admitting: Cardiology

## 2019-11-26 MED ORDER — OMEGA-3-ACID ETHYL ESTERS 1 G PO CAPS: 2.0000 | ORAL_CAPSULE | Freq: Two times a day (BID) | ORAL | 3 refills | Status: AC

## 2019-11-26 NOTE — Telephone Encounter (Signed)
New Message     *STAT* If patient is at the pharmacy, call can be transferred to refill team.   1. Which medications need to be refilled? (please list name of each medication and dose if known) Fish oil  2. Which pharmacy/location (including street and city if local pharmacy) is medication to be sent to? [place], [place].  3. Do they need a 30 day or 90 day supply? [place]

## 2019-11-26 NOTE — Telephone Encounter (Signed)
Refill sent in per request.  

## 2019-11-28 DIAGNOSIS — R972 Elevated prostate specific antigen [PSA]: Secondary | ICD-10-CM | POA: Diagnosis not present

## 2019-11-28 DIAGNOSIS — D3001 Benign neoplasm of right kidney: Secondary | ICD-10-CM | POA: Diagnosis not present

## 2019-11-28 DIAGNOSIS — N138 Other obstructive and reflux uropathy: Secondary | ICD-10-CM | POA: Diagnosis not present

## 2019-11-28 DIAGNOSIS — N401 Enlarged prostate with lower urinary tract symptoms: Secondary | ICD-10-CM | POA: Diagnosis not present

## 2019-11-28 DIAGNOSIS — R339 Retention of urine, unspecified: Secondary | ICD-10-CM | POA: Diagnosis not present

## 2019-12-13 DIAGNOSIS — M545 Low back pain: Secondary | ICD-10-CM | POA: Diagnosis not present

## 2019-12-13 DIAGNOSIS — M47816 Spondylosis without myelopathy or radiculopathy, lumbar region: Secondary | ICD-10-CM | POA: Diagnosis not present

## 2019-12-13 DIAGNOSIS — M4316 Spondylolisthesis, lumbar region: Secondary | ICD-10-CM | POA: Diagnosis not present

## 2019-12-13 DIAGNOSIS — M5136 Other intervertebral disc degeneration, lumbar region: Secondary | ICD-10-CM | POA: Diagnosis not present

## 2019-12-13 DIAGNOSIS — M48061 Spinal stenosis, lumbar region without neurogenic claudication: Secondary | ICD-10-CM | POA: Diagnosis not present

## 2019-12-13 DIAGNOSIS — M7138 Other bursal cyst, other site: Secondary | ICD-10-CM | POA: Diagnosis not present

## 2019-12-13 DIAGNOSIS — M5137 Other intervertebral disc degeneration, lumbosacral region: Secondary | ICD-10-CM | POA: Diagnosis not present

## 2019-12-19 ENCOUNTER — Ambulatory Visit: Payer: Medicare Other | Admitting: Cardiology

## 2019-12-19 DIAGNOSIS — M48062 Spinal stenosis, lumbar region with neurogenic claudication: Secondary | ICD-10-CM | POA: Diagnosis not present

## 2019-12-23 DIAGNOSIS — C61 Malignant neoplasm of prostate: Secondary | ICD-10-CM | POA: Diagnosis not present

## 2019-12-23 DIAGNOSIS — R339 Retention of urine, unspecified: Secondary | ICD-10-CM | POA: Diagnosis not present

## 2019-12-23 DIAGNOSIS — R972 Elevated prostate specific antigen [PSA]: Secondary | ICD-10-CM | POA: Diagnosis not present

## 2019-12-23 DIAGNOSIS — N138 Other obstructive and reflux uropathy: Secondary | ICD-10-CM | POA: Diagnosis not present

## 2019-12-23 DIAGNOSIS — N401 Enlarged prostate with lower urinary tract symptoms: Secondary | ICD-10-CM | POA: Diagnosis not present

## 2019-12-31 ENCOUNTER — Other Ambulatory Visit: Payer: Self-pay | Admitting: Cardiology

## 2020-01-03 DIAGNOSIS — R972 Elevated prostate specific antigen [PSA]: Secondary | ICD-10-CM | POA: Diagnosis not present

## 2020-01-03 DIAGNOSIS — N401 Enlarged prostate with lower urinary tract symptoms: Secondary | ICD-10-CM | POA: Diagnosis not present

## 2020-01-03 DIAGNOSIS — R339 Retention of urine, unspecified: Secondary | ICD-10-CM | POA: Diagnosis not present

## 2020-01-03 DIAGNOSIS — C61 Malignant neoplasm of prostate: Secondary | ICD-10-CM | POA: Diagnosis not present

## 2020-01-03 DIAGNOSIS — D3001 Benign neoplasm of right kidney: Secondary | ICD-10-CM | POA: Diagnosis not present

## 2020-01-03 DIAGNOSIS — N138 Other obstructive and reflux uropathy: Secondary | ICD-10-CM | POA: Diagnosis not present

## 2020-01-07 DIAGNOSIS — Z6834 Body mass index (BMI) 34.0-34.9, adult: Secondary | ICD-10-CM | POA: Diagnosis not present

## 2020-01-07 DIAGNOSIS — M48062 Spinal stenosis, lumbar region with neurogenic claudication: Secondary | ICD-10-CM | POA: Diagnosis not present

## 2020-01-07 DIAGNOSIS — M47816 Spondylosis without myelopathy or radiculopathy, lumbar region: Secondary | ICD-10-CM | POA: Diagnosis not present

## 2020-01-07 DIAGNOSIS — M4316 Spondylolisthesis, lumbar region: Secondary | ICD-10-CM | POA: Diagnosis not present

## 2020-01-10 ENCOUNTER — Telehealth: Payer: Self-pay

## 2020-01-10 NOTE — Telephone Encounter (Signed)
Called the pt and asked if he would like to be seen sooner for pre op clearance than the 02/2020 appt. Pt answered yes. I have scheduled 01/13/20 with Dr. Bettina Gavia @ 1 pm for pre op clearance. Pt thanked me for the help. I stated to the pt that I will not cancel the appt 02/2020 with Dr. Bettina Gavia. I assured the pt I will leave that up to Dr. Loney Hering if he feels ok to cancel the 02/2020 appt.

## 2020-01-10 NOTE — Telephone Encounter (Signed)
   [facility] Pre-operative Risk Assessment    HEARTCARE STAFF: - Please ensure there is not already an duplicate clearance open for this procedure. - Under Visit Info/Reason for Call, type in Other and utilize the format Clearance MM/DD/YY or Clearance TBD. Do not use dashes or single digits. - If request is for dental extraction, please clarify the # of teeth to be extracted.  Request for surgical clearance: Preoperative Clearance   1. What type of surgery is being performed? Lumbar Fusion  2. When is this surgery scheduled? TBS    3. What type of clearance is required (medical clearance vs. Pharmacy clearance to hold med vs. Both)? Medical  4. Are there any medications that need to be held prior to surgery and how long? None  5. Practice name and name of physician performing surgery? Dr Newman Pies  6. What is the office phone number? [phone]   Ext: [phone]   2.   What is the office fax number? [phone]  8.   Anesthesia type (None, local, MAC, general) ? General   Toni Arthurs 01/10/2020, 11:50 AM  _________________________________________________________________   (provider comments below)

## 2020-01-10 NOTE — Telephone Encounter (Signed)
   Primary Cardiologist: Dr. Bettina Gavia  Chart reviewed as part of pre-operative protocol coverage. Because of James LUNN Jr.'s past medical history and time since last visit, he will require a follow-up visit in order to better assess preoperative cardiovascular risk.  Pre-op covering staff: - Please schedule appointment and call patient to inform them. If patient already had an upcoming appointment within acceptable timeframe, please add "pre-op clearance" to the appointment notes so provider is aware. - Please contact requesting surgeon's office via preferred method (i.e, phone, fax) to inform them of need for appointment prior to surgery.  If applicable, this message will also be routed to pharmacy pool and/or primary cardiologist for input on holding anticoagulant/antiplatelet agent as requested below so that this information is available to the clearing provider at time of patient's appointment.   [place], [place]  01/10/2020, 3:16 PM

## 2020-01-12 NOTE — Progress Notes (Signed)
Cardiology Office Note:    Date:  01/13/2020   ID:  James [name]., DOB 1950-06-02, MRN [id number]  PCP:  [name], MD  Cardiologist:  Shirlee More, MD    Referring MD: [name], MD    ASSESSMENT:    1. Preoperative cardiovascular examination   2. Coronary artery disease involving native coronary artery of native heart with angina pectoris ([place])   3. Chronic systolic congestive heart failure ([place])   4. Hypertensive heart disease with chronic combined systolic and diastolic congestive heart failure ([place])   5. Typical atrial flutter ([place])   6. Ventricular tachyarrhythmia ([place])   7. On amiodarone therapy   8. ICD (implantable cardioverter-defibrillator) in place   9. CKD stage 3 due to type 1 diabetes mellitus ([place])   10. Mixed hyperlipidemia   11. Chronic obstructive pulmonary disease, unspecified COPD type ([place])    PLAN:    In order of problems listed above:  1. His anticipated surgery is elective neurosurgery for what sounds like spinal stenosis.  From a cardiovascular perspective he is stable.  He will stop carvedilol with hypotension taking alpha-blocker for urinary symptoms placed on a low-dose of Toprol-XL reduce the dose of his Entresto.  He will need to be off his clopidogrel 7 days prior to surgery and continue his other hypertensive medications lipid-lowering and diabetic.  Postoperatively when safe from bleeding perspective resume clopidogrel.  He should go to a cardiac monitored bed in hospital EKG postoperative day 1 any cardiology problem problems consult see [facility]. 2. Stable CAD having no anginal discomfort continue medical therapy transitioning from carvedilol to Toprol-XL to avoid hypotension oral nitrate and lipid-lowering atorvastatin. 3. Reduce dose of Entresto transition from carvedilol to Toprol with hypotension 4. No recurrent atrial flutter or ventricular tachycardia continue amiodarone check thyroid and liver function.  His device is  managed [facility] Dr. Ola Spurr.  He will need a smart magnet intraoperatively. 5. Improved renal function most recent labs continue his current loop diuretic 6. Continue high intensity statin 7. Clinically stable he has been seen by pulmonary.   Next appointment: 3 months   Medication Adjustments/Labs and Tests Ordered: Current medicines are reviewed at length with the patient today.  Concerns regarding medicines are outlined above.  Orders Placed This Encounter  Procedures  . TSH+T4F+T3Free  . Comprehensive metabolic panel  . Pro b natriuretic peptide (BNP)  . EKG 12-Lead   Meds ordered this encounter  Medications  . sacubitril-valsartan (ENTRESTO) 49-51 MG    Sig: Take 1 tablet by mouth 2 (two) times daily.    Dispense:  60 tablet    Refill:  3  . metoprolol succinate (TOPROL XL) 25 MG 24 hr tablet    Sig: Take 1 tablet (25 mg total) by mouth daily.    Dispense:  90 tablet    Refill:  3    Chief Complaint  Patient presents with  . Pre-op Exam    History of Present Illness:    James [name]. is a 69 y.o. male with a hx of coronary artery disease bypass surgery in 03/19/2007, ischemic cardiomyopathy ejection fraction 30% with atrial flutter and ventricular tachycardia he has an ICD and is on amiodarone.  Other problems include heart failure hypertensive heart disease hyperlipidemia and stage III CKD.  He was last seen 03/05/2019.  He is seen today for preoperative cardiovascular evaluation spine surgery lumbar fusion.. He has been seen by [place] [facility]  [facility] pulmonary and has moderate to severe COPD.  His most recent ejection fraction January 2021 EF 40%.  His ICD is followed through electrophysiology [facility].  Most recent ICD check 11/18/2019 showed normal device parameters and function.  He is right ventricularly paced less than 1% of the time he is not pacemaker dependent and has had no VT VF therapy.  Compliance with diet,  lifestyle and medications: Yes  Since last seen by me had admission at [facility] with what sounds like CNS side effects pain medications at a rehab stay he could not ambulate and from what his wife describes he has spinal stenosis and urgent intervention per neurosurgery.  He was placed on Flomax for his urinary symptoms and has had hypotension at home with blood pressures of less than 283 systolic and she stopped his carvedilol.  Fortunately he has had no shortness of breath chest pain palpitation or syncope.  He remains on amiodarone.  Left heart cath 02/13/2014 at [facility]: CONCLUSIONS:   LV FUNCTION:   Ejection Fraction: 30%   Wall Motion: Abnormal  FINDINGS:  1. PatentLIMA-LAD  2. Chronically occluded SVG-RCA and nativee RCA CTO. Flow to RCA is via LAD  collaterals.  3. Patent SVG-OM. Proximal Circumflex CTO. This graft feeds OM1 antegrade and  OM2 retrograde. The OM1 has a short CTO with LAD-OM collaterals.  4. Reduced LV systolic function, EF 66%  5. Inferior akinesis. Global hypokinesis otherwise.   CXR 03/16/2019: FINDIN GS:  Left-sided pacemaker/AICD device. Bilateral lower lobe airspace opacities are present. No pneumothorax.  IMPRESSION  Cardiomegaly with bibasilar airspace opacities which may reflect edema versus lower lobe pneumonia. Consider continued follow-up to resolution.   Past Medical History:  Diagnosis Date  . Abnormal nuclear stress test 11/03/2017   Added automatically from request for surgery [phone]  . Atrial arrhythmia 02/15/2014  . Benign hypertension with chronic kidney disease, stage III ([place]) 04/11/2018  . CAD in native artery 08/27/2014  . Cardiac dysrhythmia 02/12/2014  . Chronic systolic congestive heart failure ([place]) 10/26/2017  . CKD stage 3 due to type 1 diabetes mellitus ([place]) 08/28/2014   Overview:  Cr 1.3 at discharge 08/21/14  . COPD GOLD II 10/17/2014   Followed in Pulmonary clinic/ [facility]/  [place]  - PFTs   10/17/2014 FEV1  2.09 ( 53%) ratio 62 no sign better p B2 and dlco 63% and corrects to 85%  - 10/17/2014 p extensive coaching HFA effectiveness =  90%    > try stiolto    . Coronary artery disease involving native coronary artery of native heart with angina pectoris ([place]) 08/27/2014  . DM (diabetes mellitus) ([place])   . Dyspnea 09/05/2014   Followed in Pulmonary clinic/  [facility]/ [place] - 09/05/2014  Walked RA x 3 laps @ 185 ft each stopped due to end of study, nl pace, no desat   - pfts 10/17/14 dlco 63% on Amiodarone > may benefit from 6 month f/u but defer to Cards    . Essential hypertension 09/11/2014   Changed acei to ARB  09/05/14 due to pseudowheeze> improved 10/17/14    . Heart failure ([place])   . Hyperlipidemia   . Hypertension   . Hypertensive heart disease with congestive heart failure ([place]) 08/27/2014  . ICD (implantable cardioverter-defibrillator) in place 08/27/2014  . Metabolic bone disease 46/50/3546  . NSTEMI (non-ST elevated myocardial infarction) ([place]) 02/12/2014  . Obesity 5/68/1275   Complicated by dm/ hbp and low erv on pfts 10/17/14    .  On amiodarone therapy 08/27/2014  . Renal oncocytoma of right kidney 01/29/2018  . S/P ICD (internal cardiac defibrillator) procedure 02/15/2014  . Systolic CHF, chronic ([place]) 08/27/2014   Overview:  BNP 1350 at discharge K 3.9 on 08/21/14 Echo EF 35% May 2016  . Typical atrial flutter ([place]) 08/27/2014  . Ventricular tachyarrhythmia ([place]) 08/27/2014  . Vitamin D deficiency 01/17/2018    Past Surgical History:  Procedure Laterality Date  . CORONARY ARTERY BYPASS GRAFT  2008  . EYE SURGERY    . ICD IMPLANT    . MOHS SURGERY     Right Arm  . NEPHRECTOMY Right     Current Medications: Current Meds  Medication Sig  . acetaminophen (TYLENOL) 500 MG tablet Take 500 mg by mouth every 6 (six) hours as needed.  Marland Kitchen amiodarone (PACERONE) 200 MG tablet Take 200 mg by mouth 2 (two) times daily.  . Ascorbic Acid (VITAMIN C) 100 MG  tablet Take 100 mg by mouth 2 (two) times daily.  Marland Kitchen atorvastatin (LIPITOR) 40 MG tablet TAKE 1 TABLET BY MOUTH ONCE DAILY. *NEEDS APPOINTMENT FOR REFILLS*  . b complex vitamins capsule Take 1 capsule by mouth daily.  . clopidogrel (PLAVIX) 75 MG tablet Take 1 tablet (75 mg total) by mouth daily.  . Coenzyme Q10 (CO Q 10 PO) Take 100 mg by mouth daily.  . Cranberry 400 MG CAPS Take 400 mg by mouth 2 (two) times daily.  . ergocalciferol (VITAMIN D2) 1.25 MG (50000 UT) capsule TAKE 1 CAPSULE BY MOUTH ONCE WEEKLY.  . furosemide (LASIX) 40 MG tablet TAKE 1 TABLET BY MOUTH TWICE(2) DAILY  . insulin detemir (LEVEMIR) 100 UNIT/ML injection Inject 6 Units into the skin daily.   . isosorbide mononitrate (IMDUR) 30 MG 24 hr tablet Take 1 tablet by mouth every morning. (Hold if blood pressure under 100 sbp)  . levothyroxine (SYNTHROID, LEVOTHROID) 88 MCG tablet Take 88 mcg by mouth daily.  Marland Kitchen LORazepam (ATIVAN) 0.5 MG tablet Take 0.5 mg by mouth at bedtime as needed.  . melatonin 3 MG TABS tablet Take 1 tablet by mouth at bedtime.  . montelukast (SINGULAIR) 10 MG tablet Take 1 tablet by mouth daily as needed for allergies.  . Multiple Vitamin (MULTIVITAMIN) capsule Take 1 capsule by mouth daily.  . nitroGLYCERIN (NITROSTAT) 0.4 MG SL tablet Place 1 tablet (0.4 mg total) under the tongue every 5 (five) minutes as needed for chest pain.  Marland Kitchen omega-3 acid ethyl esters (LOVAZA) 1 g capsule Take 2 capsules (2 g total) by mouth in the morning and at bedtime.  Marland Kitchen oxyCODONE (OXY IR/ROXICODONE) 5 MG immediate release tablet Take 1 tablet by mouth every 4 (four) hours as needed.  . senna (SENOKOT) 8.6 MG tablet Take 1 tablet by mouth at bedtime.  . sitaGLIPtin (JANUVIA) 25 MG tablet Take 1 tablet by mouth daily.  . tamsulosin (FLOMAX) 0.4 MG CAPS capsule Take 1 capsule by mouth daily.  . Zinc 50 MG TABS Take 1 tablet by mouth daily.  . [DISCONTINUED] carvedilol (COREG) 12.5 MG tablet Take 1 tablet by mouth in the  morning and at bedtime.  . [DISCONTINUED] sacubitril-valsartan (ENTRESTO) 97-103 MG Take 1 tablet by mouth 2 (two) times daily.     Allergies:   Baclofen and Codeine   Social History   Socioeconomic History  . Marital status: Married    Spouse name: Not on file  . Number of children: 4  . Years of education: 98  . Highest education level: Some  college, no degree  Occupational History  . Occupation: Sales   Tobacco Use  . Smoking status: Former Smoker    Packs/day: 1.00    Years: 38.00    Pack years: 38.00    Types: Cigarettes    Quit date: 04/04/2006    Years since quitting: 13.7  . Smokeless tobacco: Never Used  Vaping Use  . Vaping Use: Never used  Substance and Sexual Activity  . Alcohol use: No    Alcohol/week: 0.0 standard drinks    Comment: Rarely  . Drug use: No  . Sexual activity: Not on file  Other Topics Concern  . 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    Social Determinants of Health   Financial Resource Strain:   . Difficulty of Paying Living Expenses: Not on file  Food Insecurity:   . Worried About Charity fundraiser in the Last Year: Not on file  . Ran Out of Food in the Last Year: Not on file  Transportation Needs:   . Lack of Transportation (Medical): Not on file  . Lack of Transportation (Non-Medical): Not on file  Physical Activity:   . Days of Exercise per Week: Not on file  . Minutes of Exercise per Session: Not on file  Stress:   . Feeling of Stress : Not on file  Social Connections:   . Frequency of Communication with Friends and Family: Not on file  . Frequency of Social Gatherings with Friends and Family: Not on file  . Attends Religious Services: Not on file  . Active Member of Clubs or Organizations: Not on file  . Attends Archivist Meetings: Not on file  . Marital Status: Not on file      Family History: The patient's family history includes Breast cancer in his mother; COPD in his brother; Cancer in his daughter; Heart disease in his father and mother. ROS:   Please see the history of present illness.    All other systems reviewed and are negative.  EKGs/Labs/Other Studies Reviewed:    The following studies were reviewed today:  EKG:  EKG ordered today and personally reviewed.  The ekg ordered today demonstrates sinus rhythm old inferior apical myocardial infarction  Recent Labs: 10/04/2019: K 3.8, Cr 1.25 GFR 59 cc/min   Physical Exam:    VS:  BP 120/72   Pulse 75   Ht 5\' 8"  (1.727 m)   Wt 230 lb (104.3 kg)   SpO2 94%   BMI 34.97 kg/m     Wt Readings from Last 3 Encounters:  01/13/20 230 lb (104.3 kg)  08/28/19 226 lb 6.4 oz (102.7 kg)  08/21/19 227 lb (103 kg)     GEN: Dramatic change in his appearance he looks debilitated today n no acute distress HEENT: Normal NECK: No JVD; No carotid bruits LYMPHATICS: No lymphadenopathy CARDIAC: RRR, no murmurs, rubs, gallops RESPIRATORY:  Clear to auscultation without rales, wheezing or rhonchi  ABDOMEN: Soft, non-tender, non-distended MUSCULOSKELETAL:  No edema; No deformity  SKIN: Warm and dry NEUROLOGIC:  Alert and oriented x 3 PSYCHIATRIC:  Normal affect    Signed, Shirlee More, MD  01/13/2020 1:51 PM    [facility]

## 2020-01-13 ENCOUNTER — Other Ambulatory Visit: Payer: Self-pay

## 2020-01-13 ENCOUNTER — Ambulatory Visit (INDEPENDENT_AMBULATORY_CARE_PROVIDER_SITE_OTHER): Payer: Medicare Other | Admitting: Cardiology

## 2020-01-13 VITALS — BP 120/72 | HR 75 | Ht 68.0 in | Wt 230.0 lb

## 2020-01-13 DIAGNOSIS — I472 Ventricular tachycardia, unspecified: Secondary | ICD-10-CM

## 2020-01-13 DIAGNOSIS — E782 Mixed hyperlipidemia: Secondary | ICD-10-CM | POA: Diagnosis not present

## 2020-01-13 DIAGNOSIS — I5042 Chronic combined systolic (congestive) and diastolic (congestive) heart failure: Secondary | ICD-10-CM | POA: Diagnosis not present

## 2020-01-13 DIAGNOSIS — N183 Chronic kidney disease, stage 3 unspecified: Secondary | ICD-10-CM | POA: Diagnosis not present

## 2020-01-13 DIAGNOSIS — Z01810 Encounter for preprocedural cardiovascular examination: Secondary | ICD-10-CM

## 2020-01-13 DIAGNOSIS — I25119 Atherosclerotic heart disease of native coronary artery with unspecified angina pectoris: Secondary | ICD-10-CM | POA: Diagnosis not present

## 2020-01-13 DIAGNOSIS — E119 Type 2 diabetes mellitus without complications: Secondary | ICD-10-CM | POA: Insufficient documentation

## 2020-01-13 DIAGNOSIS — E1022 Type 1 diabetes mellitus with diabetic chronic kidney disease: Secondary | ICD-10-CM

## 2020-01-13 DIAGNOSIS — J449 Chronic obstructive pulmonary disease, unspecified: Secondary | ICD-10-CM

## 2020-01-13 DIAGNOSIS — I110 Hypertensive heart disease with heart failure: Secondary | ICD-10-CM | POA: Diagnosis not present

## 2020-01-13 DIAGNOSIS — I5022 Chronic systolic (congestive) heart failure: Secondary | ICD-10-CM

## 2020-01-13 DIAGNOSIS — Z95810 Presence of automatic (implantable) cardiac defibrillator: Secondary | ICD-10-CM

## 2020-01-13 DIAGNOSIS — I483 Typical atrial flutter: Secondary | ICD-10-CM | POA: Diagnosis not present

## 2020-01-13 DIAGNOSIS — I509 Heart failure, unspecified: Secondary | ICD-10-CM | POA: Insufficient documentation

## 2020-01-13 DIAGNOSIS — Z79899 Other long term (current) drug therapy: Secondary | ICD-10-CM

## 2020-01-13 DIAGNOSIS — I10 Essential (primary) hypertension: Secondary | ICD-10-CM | POA: Insufficient documentation

## 2020-01-13 MED ORDER — ENTRESTO 49-51 MG PO TABS: 1.0000 | ORAL_TABLET | Freq: Two times a day (BID) | ORAL | 3 refills | Status: AC

## 2020-01-13 MED ORDER — METOPROLOL SUCCINATE ER 25 MG PO TB24: 25.0000 mg | ORAL_TABLET | Freq: Every day | ORAL | 3 refills | Status: DC

## 2020-01-13 NOTE — Patient Instructions (Signed)
Medication Instructions:  Your physician has recommended you make the following change in your medication:  STOP: Coreg DECREASE: Entresto 49/51mg  take one tablet by mouth twice daily.  START: TOPROL XL 25 mg take one tablet by mouth daily.  *If you need a refill on your cardiac medications before your next appointment, please call your pharmacy*   Lab Work: Your physician recommends that you return for lab work in: TODAY CMP, ProBNP, TSH, T3, T4 If you have labs (blood work) drawn today and your tests are completely normal, you will receive your results only by: Marland Kitchen MyChart Message (if you have MyChart) OR . A paper copy in the mail If you have any lab test that is abnormal or we need to change your treatment, we will call you to review the results.   Testing/Procedures: None   Follow-Up: At [facility], you and your health needs are our priority.  As part of our continuing mission to provide you with exceptional heart care, we have created designated Provider Care Teams.  These Care Teams include your primary Cardiologist (physician) and Advanced Practice Providers (APPs -  Physician Assistants and Nurse Practitioners) who all work together to provide you with the care you need, when you need it.  We recommend signing up for the patient portal called "MyChart".  Sign up information is provided on this After Visit Summary.  MyChart is used to connect with patients for Virtual Visits (Telemedicine).  Patients are able to view lab/test results, encounter notes, upcoming appointments, etc.  Non-urgent messages can be sent to your provider as well.   To learn more about what you can do with MyChart, go to [email or web address].    Your next appointment:   3 month(s)  The format for your next appointment:   In Person  Provider:   Shirlee More, MD   Other Instructions

## 2020-01-14 ENCOUNTER — Telehealth: Payer: Self-pay | Admitting: Cardiology

## 2020-01-14 LAB — COMPREHENSIVE METABOLIC PANEL
ALT: 49 IU/L — ABNORMAL HIGH (ref 0–44)
AST: 24 IU/L (ref 0–40)
Albumin/Globulin Ratio: 2.1 (ref 1.2–2.2)
Albumin: 4.7 g/dL (ref 3.8–4.8)
Alkaline Phosphatase: 71 IU/L (ref 44–121)
BUN/Creatinine Ratio: 29 — ABNORMAL HIGH (ref 10–24)
BUN: 43 mg/dL — ABNORMAL HIGH (ref 8–27)
Bilirubin Total: 0.3 mg/dL (ref 0.0–1.2)
CO2: 23 mmol/L (ref 20–29)
Calcium: 9.9 mg/dL (ref 8.6–10.2)
Chloride: 102 mmol/L (ref 96–106)
Creatinine, Ser: 1.47 mg/dL — ABNORMAL HIGH (ref 0.76–1.27)
GFR calc Af Amer: 56 mL/min/{1.73_m2} — ABNORMAL LOW (ref >59)
GFR calc non Af Amer: 48 mL/min/{1.73_m2} — ABNORMAL LOW (ref >59)
Globulin, Total: 2.2 g/dL (ref 1.5–4.5)
Glucose: 124 mg/dL — ABNORMAL HIGH (ref 65–99)
Potassium: 4 mmol/L (ref 3.5–5.2)
Sodium: 143 mmol/L (ref 134–144)
Total Protein: 6.9 g/dL (ref 6.0–8.5)

## 2020-01-14 LAB — TSH+T4F+T3FREE
Free T4: 1.65 ng/dL (ref 0.82–1.77)
T3, Free: 1.6 pg/mL — ABNORMAL LOW (ref 2.0–4.4)
TSH: 2.65 u[IU]/mL (ref 0.450–4.500)

## 2020-01-14 LAB — PRO B NATRIURETIC PEPTIDE: NT-Pro BNP: 1199 pg/mL — ABNORMAL HIGH (ref 0–376)

## 2020-01-14 NOTE — Telephone Encounter (Signed)
Patient returning call for lab results. 

## 2020-01-14 NOTE — Telephone Encounter (Signed)
Patient informed of results.  

## 2020-01-27 DIAGNOSIS — D3001 Benign neoplasm of right kidney: Secondary | ICD-10-CM | POA: Diagnosis not present

## 2020-01-27 DIAGNOSIS — N138 Other obstructive and reflux uropathy: Secondary | ICD-10-CM | POA: Diagnosis not present

## 2020-01-27 DIAGNOSIS — R339 Retention of urine, unspecified: Secondary | ICD-10-CM | POA: Diagnosis not present

## 2020-01-27 DIAGNOSIS — N401 Enlarged prostate with lower urinary tract symptoms: Secondary | ICD-10-CM | POA: Diagnosis not present

## 2020-01-27 DIAGNOSIS — C61 Malignant neoplasm of prostate: Secondary | ICD-10-CM | POA: Diagnosis not present

## 2020-01-29 ENCOUNTER — Other Ambulatory Visit: Payer: Self-pay | Admitting: Cardiology

## 2020-01-29 ENCOUNTER — Other Ambulatory Visit: Payer: Self-pay | Admitting: Neurosurgery

## 2020-02-05 NOTE — Progress Notes (Signed)
[place], [address] [place] [address] Phone: [phone] Fax: [phone]  [place] [address] [place] [address] [place] [address] Phone: [phone] Fax: [phone]     Your procedure is scheduled on Monday, November 8th.  Report to [facility] Main Entrance "A" at 5:30 A.M., and check in at the Admitting office.   Call this number if you have problems the morning of surgery: [phone]  Call [phone] if you have any questions prior to your surgery date Monday-Friday 8am-4pm    Remember:  Do not eat or drink after midnight the night before your surgery    Take these medicines the morning of surgery with A SIP OF WATER   Tylenol - if needed  Amiodarone (pacerone)  Atorvastatin (Lipitor)  Isosorbide Mononitrate (Imdur)  Levothyroxine (Synthroid)  Lorazepam (Ativan) - if needed  Metoprolol  Nitroglycerin - if needed   Follow your surgeon's instructions on when to stop Plavix.  If no instructions were given by your surgeon then you will need to call the office to get those instructions.    As of today, STOP taking any Aspirin (unless otherwise instructed by your surgeon) Aleve, Naproxen, Ibuprofen, Motrin, Advil, Goody's, BC's, all herbal medications, fish oil, and all vitamins.   WHAT DO I DO ABOUT MY DIABETES MEDICATION?   Marland Kitchen Do not take oral diabetes medicines (pills) the morning of surgery. - Januvia  . THE NIGHT BEFORE SURGERY, take ___________ units of Levemir      . THE MORNING OF SURGERY, take _________ units of Levemir    HOW TO MANAGE YOUR DIABETES BEFORE AND AFTER SURGERY  Why is it important to control my blood sugar before and after surgery? . Improving blood sugar levels before and after surgery helps healing and can limit problems. . A way of improving blood sugar control is eating a healthy diet by: o  Eating less sugar and carbohydrates o   Increasing activity/exercise o  Talking with your doctor about reaching your blood sugar goals . High blood sugars (greater than 180 mg/dL) can raise your risk of infections and slow your recovery, so you will need to focus on controlling your diabetes during the weeks before surgery. . Make sure that the doctor who takes care of your diabetes knows about your planned surgery including the date and location.  How do I manage my blood sugar before surgery? . Check your blood sugar at least 4 times a day, starting 2 days before surgery, to make sure that the level is not too high or low. . Check your blood sugar the morning of your surgery when you wake up and every 2 hours until you get to the Short Stay unit. o If your blood sugar is less than 70 mg/dL, you will need to treat for low blood sugar: - Do not take insulin. - Treat a low blood sugar (less than 70 mg/dL) with  cup of clear juice (cranberry or apple), 4 glucose tablets, OR glucose gel. - Recheck blood sugar in 15 minutes after treatment (to make sure it is greater than 70 mg/dL). If your blood sugar is not greater than 70 mg/dL on recheck, call [phone] for further instructions. . Report your blood sugar to the short stay nurse when you get to Short Stay.  . If you are admitted to the hospital after surgery: o Your blood sugar will be checked by the staff  and you will probably be given insulin after surgery (instead of oral diabetes medicines) to make sure you have good blood sugar levels. o The goal for blood sugar control after surgery is 80-180 mg/dL.               DAY OF SURGERY:                   Do not wear jewelry            Do not wear lotions, powders, colognes, or deodorant.            Men may shave face and neck.            Do not bring valuables to the hospital.            [facility] is not responsible for any belongings or valuables.  Do NOT Smoke (Tobacco/Vaping) or drink Alcohol 24 hours prior to your  procedure If you use a CPAP at night, you may bring all equipment for your overnight stay.   Contacts, glasses, dentures or bridgework may not be worn into surgery.      For patients admitted to the hospital, discharge time will be determined by your treatment team.   Patients discharged the day of surgery will not be allowed to drive home, and someone needs to stay with them for 24 hours.    Special instructions:   [facility]- Preparing For Surgery  Before surgery, you can play an important role. Because skin is not sterile, your skin needs to be as free of germs as possible. You can reduce the number of germs on your skin by washing with CHG (chlorahexidine gluconate) Soap before surgery.  CHG is an antiseptic cleaner which kills germs and bonds with the skin to continue killing germs even after washing.    Oral Hygiene is also important to reduce your risk of infection.  Remember - BRUSH YOUR TEETH THE MORNING OF SURGERY WITH YOUR REGULAR TOOTHPASTE  Please do not use if you have an allergy to CHG or antibacterial soaps. If your skin becomes reddened/irritated stop using the CHG.  Do not shave (including legs and underarms) for at least 48 hours prior to first CHG shower. It is OK to shave your face.  Please follow these instructions carefully.   1. Shower the NIGHT BEFORE SURGERY and the MORNING OF SURGERY with CHG Soap.   2. If you chose to wash your hair, wash your hair first as usual with your normal shampoo.  3. After you shampoo, rinse your hair and body thoroughly to remove the shampoo.  4. Use CHG as you would any other liquid soap. You can apply CHG directly to the skin and wash gently with a scrungie or a clean washcloth.   5. Apply the CHG Soap to your body ONLY FROM THE NECK DOWN.  Do not use on open wounds or open sores. Avoid contact with your eyes, ears, mouth and genitals (private parts). Wash Face and genitals (private parts)  with your normal soap.   6. Wash  thoroughly, paying special attention to the area where your surgery will be performed.  7. Thoroughly rinse your body with warm water from the neck down.  8. DO NOT shower/wash with your normal soap after using and rinsing off the CHG Soap.  9. Pat yourself dry with a CLEAN TOWEL.  10. Wear CLEAN PAJAMAS to bed the night before surgery  11. Place CLEAN SHEETS on your  bed the night of your first shower and DO NOT SLEEP WITH PETS.   Day of Surgery: Wear Clean/Comfortable clothing the morning of surgery Do not apply any deodorants/lotions.   Remember to brush your teeth WITH YOUR REGULAR TOOTHPASTE.   Please read over the following fact sheets that you were given.

## 2020-02-06 ENCOUNTER — Encounter (HOSPITAL_COMMUNITY): Payer: Self-pay

## 2020-02-06 ENCOUNTER — Encounter (HOSPITAL_COMMUNITY)
Admission: RE | Admit: 2020-02-06 | Discharge: 2020-02-06 | Disposition: A | Discharge status: Home / Self Care | Payer: Medicare Other | Source: Ambulatory Visit | Attending: Neurosurgery | Admitting: Neurosurgery

## 2020-02-06 ENCOUNTER — Other Ambulatory Visit: Payer: Self-pay

## 2020-02-06 ENCOUNTER — Other Ambulatory Visit (HOSPITAL_COMMUNITY)
Admission: RE | Admit: 2020-02-06 | Discharge: 2020-02-06 | Disposition: A | Discharge status: Home / Self Care | Payer: Medicare Other | Source: Ambulatory Visit | Attending: Neurosurgery | Admitting: Neurosurgery

## 2020-02-06 DIAGNOSIS — Z20822 Contact with and (suspected) exposure to covid-19: Secondary | ICD-10-CM | POA: Insufficient documentation

## 2020-02-06 DIAGNOSIS — Z01812 Encounter for preprocedural laboratory examination: Secondary | ICD-10-CM | POA: Insufficient documentation

## 2020-02-06 LAB — BASIC METABOLIC PANEL
Anion gap: 11 (ref 5–15)
BUN: 28 mg/dL — ABNORMAL HIGH (ref 8–23)
CO2: 29 mmol/L (ref 22–32)
Calcium: 9.8 mg/dL (ref 8.9–10.3)
Chloride: 99 mmol/L (ref 98–111)
Creatinine, Ser: 1.5 mg/dL — ABNORMAL HIGH (ref 0.61–1.24)
GFR, Estimated: 50 mL/min — ABNORMAL LOW (ref >60)
Glucose, Bld: 122 mg/dL — ABNORMAL HIGH (ref 70–99)
Potassium: 4 mmol/L (ref 3.5–5.1)
Sodium: 139 mmol/L (ref 135–145)

## 2020-02-06 LAB — GLUCOSE, CAPILLARY: Glucose-Capillary: 122 mg/dL — ABNORMAL HIGH (ref 70–99)

## 2020-02-06 LAB — CBC
HCT: 42.1 % (ref 39.0–52.0)
Hemoglobin: 13.8 g/dL (ref 13.0–17.0)
MCH: 30.7 pg (ref 26.0–34.0)
MCHC: 32.8 g/dL (ref 30.0–36.0)
MCV: 93.6 fL (ref 80.0–100.0)
Platelets: 222 10*3/uL (ref 150–400)
RBC: 4.5 MIL/uL (ref 4.22–5.81)
RDW: 14.1 % (ref 11.5–15.5)
WBC: 8 10*3/uL (ref 4.0–10.5)
nRBC: 0 % (ref 0.0–0.2)

## 2020-02-06 LAB — TYPE AND SCREEN
ABO/RH(D): A POS
Antibody Screen: NEGATIVE

## 2020-02-06 LAB — HEMOGLOBIN A1C
Hgb A1c MFr Bld: 5.9 % — ABNORMAL HIGH (ref 4.8–5.6)
Mean Plasma Glucose: 122.63 mg/dL

## 2020-02-06 LAB — SURGICAL PCR SCREEN
MRSA, PCR: NEGATIVE
Staphylococcus aureus: NEGATIVE

## 2020-02-06 LAB — SARS CORONAVIRUS 2 (TAT 6-24 HRS): SARS Coronavirus 2: NEGATIVE

## 2020-02-06 NOTE — Progress Notes (Signed)
PCP - Dr. Nona Dell Cardiologist - Dr. Bettina Gavia; Dr. Ola Spurr (EP)-[facility]  PPM/ICD - ICD Device Orders - faxed- waiting on response Rep Notified - [employer] reps emailed  Chest x-ray - N/A EKG - 01/13/20 Stress Test -  ECHO - 04/29/19 Cardiac Cath - 2019  Sleep Study - has had sleep study in past CPAP - does not use  Fasting Blood Sugar - 120-140's Checks Blood Sugar 2 times a day  Blood Thinner Instructions: last dose of Plavix 02/03/20 Aspirin Instructions: Patient instructed to hold all Aspirin, NSAID's, herbal medications, fish oil and vitamins 7 days prior to surgery.    COVID TEST- 02/06/20 at at [facility]. Pt instructed to remain in their car. Educated on Transport planner until [employer].    Anesthesia review: cardiac history  Patient denies shortness of breath, fever, cough and chest pain at PAT appointment   All instructions explained to the patient, with a verbal understanding of the material. Patient agrees to go over the instructions while at home for a better understanding. Patient also instructed to self quarantine after being tested for COVID-19. The opportunity to ask questions was provided.

## 2020-02-06 NOTE — Progress Notes (Signed)
[place], [place]. [place] [address] Phone: [phone] Fax: [phone]  [place] [address], [place] - [place] [address] [place] [address] Phone: [phone] Fax: [phone]     Your procedure is scheduled on Monday, November 8th.  Report to [facility] Main Entrance "A" at 5:30 A.M., and check in at the Admitting office.   Call this number if you have problems the morning of surgery: [phone]  Call [phone] if you have any questions prior to your surgery date Monday-Friday 8am-4pm    Remember:  Do not eat or drink after midnight the night before your surgery    Take these medicines the morning of surgery with A SIP OF WATER   Tylenol - if needed  Amiodarone (pacerone)  Atorvastatin (Lipitor)  Isosorbide Mononitrate (Imdur)  Levothyroxine (Synthroid)  Lorazepam (Ativan) - if needed  Metoprolol  Nitroglycerin - if needed   Follow your surgeon's instructions on when to stop Plavix.  If no instructions were given by your surgeon then you will need to call the office to get those instructions.    As of today, STOP taking any Aspirin (unless otherwise instructed by your surgeon) Aleve, Naproxen, Ibuprofen, Motrin, Advil, Goody's, BC's, all herbal medications, fish oil, and all vitamins.   WHAT DO I DO ABOUT MY DIABETES MEDICATION?   Marland Kitchen Do not take oral diabetes medicines (pills) the morning of surgery. - Januvia     . THE MORNING OF SURGERY, take 5 units of Levemir    HOW TO MANAGE YOUR DIABETES BEFORE AND AFTER SURGERY  Why is it important to control my blood sugar before and after surgery? . Improving blood sugar levels before and after surgery helps healing and can limit problems. . A way of improving blood sugar control is eating a healthy diet by: o  Eating less sugar and carbohydrates o  Increasing activity/exercise o  Talking with your doctor about reaching your  blood sugar goals . High blood sugars (greater than 180 mg/dL) can raise your risk of infections and slow your recovery, so you will need to focus on controlling your diabetes during the weeks before surgery. . Make sure that the doctor who takes care of your diabetes knows about your planned surgery including the date and location.  How do I manage my blood sugar before surgery? . Check your blood sugar at least 4 times a day, starting 2 days before surgery, to make sure that the level is not too high or low. . Check your blood sugar the morning of your surgery when you wake up and every 2 hours until you get to the Short Stay unit. o If your blood sugar is less than 70 mg/dL, you will need to treat for low blood sugar: - Do not take insulin. - Treat a low blood sugar (less than 70 mg/dL) with  cup of clear juice (cranberry or apple), 4 glucose tablets, OR glucose gel. - Recheck blood sugar in 15 minutes after treatment (to make sure it is greater than 70 mg/dL). If your blood sugar is not greater than 70 mg/dL on recheck, call [phone] for further instructions. . Report your blood sugar to the short stay nurse when you get to Short Stay.  . If you are admitted to the hospital after surgery: o Your blood sugar will be checked by the staff and you will probably be given insulin after surgery (instead of oral  diabetes medicines) to make sure you have good blood sugar levels. o The goal for blood sugar control after surgery is 80-180 mg/dL.               DAY OF SURGERY:                   Do not wear jewelry            Do not wear lotions, powders, colognes, or deodorant.            Men may shave face and neck.            Do not bring valuables to the hospital.            [facility] is not responsible for any belongings or valuables.  Do NOT Smoke (Tobacco/Vaping) or drink Alcohol 24 hours prior to your procedure If you use a CPAP at night, you may bring all equipment for your overnight  stay.   Contacts, glasses, dentures or bridgework may not be worn into surgery.      For patients admitted to the hospital, discharge time will be determined by your treatment team.   Patients discharged the day of surgery will not be allowed to drive home, and someone needs to stay with them for 24 hours.    Special instructions:   [place]- Preparing For Surgery  Before surgery, you can play an important role. Because skin is not sterile, your skin needs to be as free of germs as possible. You can reduce the number of germs on your skin by washing with CHG (chlorahexidine gluconate) Soap before surgery.  CHG is an antiseptic cleaner which kills germs and bonds with the skin to continue killing germs even after washing.    Oral Hygiene is also important to reduce your risk of infection.  Remember - BRUSH YOUR TEETH THE MORNING OF SURGERY WITH YOUR REGULAR TOOTHPASTE  Please do not use if you have an allergy to CHG or antibacterial soaps. If your skin becomes reddened/irritated stop using the CHG.  Do not shave (including legs and underarms) for at least 48 hours prior to first CHG shower. It is OK to shave your face.  Please follow these instructions carefully.   1. Shower the NIGHT BEFORE SURGERY and the MORNING OF SURGERY with CHG Soap.   2. If you chose to wash your hair, wash your hair first as usual with your normal shampoo.  3. After you shampoo, rinse your hair and body thoroughly to remove the shampoo.  4. Use CHG as you would any other liquid soap. You can apply CHG directly to the skin and wash gently with a scrungie or a clean washcloth.   5. Apply the CHG Soap to your body ONLY FROM THE NECK DOWN.  Do not use on open wounds or open sores. Avoid contact with your eyes, ears, mouth and genitals (private parts). Wash Face and genitals (private parts)  with your normal soap.   6. Wash thoroughly, paying special attention to the area where your surgery will be  performed.  7. Thoroughly rinse your body with warm water from the neck down.  8. DO NOT shower/wash with your normal soap after using and rinsing off the CHG Soap.  9. Pat yourself dry with a CLEAN TOWEL.  10. Wear CLEAN PAJAMAS to bed the night before surgery  11. Place CLEAN SHEETS on your bed the night of your first shower and DO NOT SLEEP WITH  PETS.   Day of Surgery: Wear Clean/Comfortable clothing the morning of surgery Do not apply any deodorants/lotions.   Remember to brush your teeth WITH YOUR REGULAR TOOTHPASTE.   Please read over the following fact sheets that you were given.

## 2020-02-07 NOTE — Progress Notes (Signed)
Anesthesia Chart Review:  Follows with cardiology for history of CAD (s/p CABG 2008, cath 2009 with patent LIMA to LAD, occluded RCA and SVG to RCA), VT (s/p Medtronic ICD), PAF, and HFrEF.  Last seen by Dr. Bettina Gavia 01/13/2020.  At that time he was stable from cardiovascular standpoint and having no anginal discomfort.  Surgical evaluation per note, "His anticipated surgery is elective neurosurgery for what sounds like spinal stenosis.  From a cardiovascular perspective he is stable.  He will stop carvedilol with hypotension taking alpha-blocker for urinary symptoms placed on a low-dose of Toprol-XL reduce the dose of his Entresto.  He will need to be off his clopidogrel 7 days prior to surgery and continue his other hypertensive medications lipid-lowering and diabetic.  Postoperatively when safe from bleeding perspective resume clopidogrel.  He should go to a cardiac monitored bed in hospital EKG postoperative day 1 any cardiology problem problems consult see [facility] heart [facility]."  Patient also follows with EP cardiology at [facility] for history of VT status post placement of Medtronic ICD.  Per note, last reported episode of VT was 01/2019 and was successfully terminated.  Perioperative device form on patient chart states procedure should not interfere with device function, place grounding pad on patient leg.  History of COPD Gold 2, last seen by pulmonologist Dr. Melvyn Novas 10/18/2014, at that time stated he can follow-up with pulmonology as needed and continue with cardiology for monitoring of pulmonary status.  CKD 3 followed by nephrology.  IDDM2 well-controlled, A1c 5.9 on preop labs.  Preop labs reviewed, creatinine 1.50 consistent with history of CKD, otherwise unremarkable.  EKG 01/13/2020: Sinus rhythm with first-degree AV block.  Rate 75.  LAD.  Nonspecific intraventricular block.  Inferior infarct, age undetermined.   TTE 04/29/2019: 1. Left ventricular ejection fraction, by visual estimation, is 40%  from  limited para sternal views without definity. Left ventricular septal wall  thickness was mildly increased. Normal left ventricular posterior wall  thickness. There is no left  ventricular hypertrophy.  2. Left ventricular diastolic parameters are consistent with Grade II  diastolic dysfunction (pseudonormalization).  3. Mildly dilated left ventricular internal cavity size.  4. The left ventricle demonstrates global hypokinesis.  5. Global right ventricle has mildly reduced systolic function.The right  ventricular size is normal. No increase in right ventricular wall  thickness.  6. Left atrial size was moderately dilated.  7. Right atrial size was normal.  8. The mitral valve is normal in structure. Mild mitral valve  regurgitation. No evidence of mitral stenosis.  9. The tricuspid valve is normal in structure.  10. The tricuspid valve is normal in structure. Tricuspid valve  regurgitation is not demonstrated.  11. The aortic valve is tricuspid. Aortic valve regurgitation is not  visualized. Mild aortic valve sclerosis without stenosis.  12. The pulmonic valve was not well visualized. Pulmonic valve  regurgitation is not visualized.  13. The inferior vena cava is normal in size with <50% respiratory  variability, suggesting right atrial pressure of 8 mmHg.  14. The aortic root was not well visualized and the ascending aorta was  not well visualized.   [facility] 11/27/2017 (Care Everywhere): 3 v cad  Patent lima lad; svg om but 100% om after insertion  Historical occlusion rca, and svg rca    Wynonia Musty [facility] Short Stay Center/Anesthesiology Phone [phone] 02/07/2020 9:53 AM

## 2020-02-07 NOTE — Anesthesia Preprocedure Evaluation (Addendum)
Anesthesia Evaluation  Patient identified by MRN, date of birth, ID band Patient awake    Reviewed: Allergy & Precautions, NPO status , Patient's Chart, lab work & pertinent test results  Airway Mallampati: III  TM Distance: <3 FB Neck ROM: Full    Dental  (+) Poor Dentition, Chipped, Missing, Loose, Dental Advisory Given   Pulmonary COPD, former smoker,     + decreased breath sounds      Cardiovascular hypertension, Pt. on home beta blockers + CAD, + Past MI, + CABG and +CHF  + dysrhythmias Atrial Fibrillation + Cardiac Defibrillator  Rhythm:Regular Rate:Normal     Neuro/Psych negative neurological ROS  negative psych ROS   GI/Hepatic negative GI ROS, Neg liver ROS,   Endo/Other  diabetes, Type 2, Insulin Dependent, Oral Hypoglycemic Agents  Renal/GU Renal disease  negative genitourinary   Musculoskeletal   Abdominal (+) + obese,   Peds  Hematology   Anesthesia Other Findings   Reproductive/Obstetrics                           Anesthesia Physical Anesthesia Plan  ASA: III  Anesthesia Plan: General   Post-op Pain Management:    Induction: Intravenous  PONV Risk Score and Plan: 3 and Ondansetron, Dexamethasone and Midazolam  Airway Management Planned: Oral ETT  Additional Equipment: None  Intra-op Plan:   Post-operative Plan: Extubation in OR  Informed Consent: I have reviewed the patients History and Physical, chart, labs and discussed the procedure including the risks, benefits and alternatives for the proposed anesthesia with the patient or authorized representative who has indicated his/her understanding and acceptance.     Dental advisory given  Plan Discussed with: CRNA  Anesthesia Plan Comments: (PAT note by Karoline Caldwell, PA-C: Follows with cardiology for history of CAD (s/p CABG 2008, cath 2009 with patent LIMA to LAD, occluded RCA and SVG to RCA), VT (s/p Medtronic  ICD), PAF, and HFrEF.  Last seen by Dr. Bettina Gavia 01/13/2020.  At that time he was stable from cardiovascular standpoint and having no anginal discomfort.  Surgical evaluation per note, "His anticipated surgery is elective neurosurgery for what sounds like spinal stenosis.  From a cardiovascular perspective he is stable.  He will stop carvedilol with hypotension taking alpha-blocker for urinary symptoms placed on a low-dose of Toprol-XL reduce the dose of his Entresto.  He will need to be off his clopidogrel 7 days prior to surgery and continue his other hypertensive medications lipid-lowering and diabetic.  Postoperatively when safe from bleeding perspective resume clopidogrel.  He should go to a cardiac monitored bed in hospital EKG postoperative day 1 any cardiology problem problems consult see [facility] heart [facility]."  Patient also follows with EP cardiology at [facility] for history of VT status post placement of Medtronic ICD.  Per note, last reported episode of VT was 01/2019 and was successfully terminated.  Perioperative device form on patient chart states procedure should not interfere with device function, place grounding pad on patient leg.  History of COPD Gold 2, last seen by pulmonologist Dr. Melvyn Novas 10/18/2014, at that time stated he can follow-up with pulmonology as needed and continue with cardiology for monitoring of pulmonary status.  CKD 3 followed by nephrology.  IDDM2 well-controlled, A1c 5.9 on preop labs.  Preop labs reviewed, creatinine 1.50 consistent with history of CKD, otherwise unremarkable.  EKG 01/13/2020: Sinus rhythm with first-degree AV block.  Rate 75.  LAD.  Nonspecific intraventricular block.  Inferior infarct,  age undetermined.   TTE 04/29/2019: 1. Left ventricular ejection fraction, by visual estimation, is 40% from  limited para sternal views without definity. Left ventricular septal wall  thickness was mildly increased. Normal left ventricular posterior wall  thickness.  There is no left  ventricular hypertrophy.  2. Left ventricular diastolic parameters are consistent with Grade II  diastolic dysfunction (pseudonormalization).  3. Mildly dilated left ventricular internal cavity size.  4. The left ventricle demonstrates global hypokinesis.  5. Global right ventricle has mildly reduced systolic function.The right  ventricular size is normal. No increase in right ventricular wall  thickness.  6. Left atrial size was moderately dilated.  7. Right atrial size was normal.  8. The mitral valve is normal in structure. Mild mitral valve  regurgitation. No evidence of mitral stenosis.  9. The tricuspid valve is normal in structure.  10. The tricuspid valve is normal in structure. Tricuspid valve  regurgitation is not demonstrated.  11. The aortic valve is tricuspid. Aortic valve regurgitation is not  visualized. Mild aortic valve sclerosis without stenosis.  12. The pulmonic valve was not well visualized. Pulmonic valve  regurgitation is not visualized.  13. The inferior vena cava is normal in size with <50% respiratory  variability, suggesting right atrial pressure of 8 mmHg.  14. The aortic root was not well visualized and the ascending aorta was  not well visualized.   [place] 11/27/2017 (Care Everywhere): 3 v cad  Patent lima lad; svg om but 100% om after insertion  Historical occlusion rca, and svg rca   )      Anesthesia Quick Evaluation

## 2020-02-10 ENCOUNTER — Encounter (HOSPITAL_COMMUNITY): Payer: Self-pay | Admitting: Neurosurgery

## 2020-02-10 ENCOUNTER — Inpatient Hospital Stay (HOSPITAL_COMMUNITY): Payer: Medicare Other | Admitting: Physician Assistant

## 2020-02-10 ENCOUNTER — Other Ambulatory Visit: Payer: Self-pay

## 2020-02-10 ENCOUNTER — Inpatient Hospital Stay (HOSPITAL_COMMUNITY): Payer: Medicare Other | Admitting: Vascular Surgery

## 2020-02-10 ENCOUNTER — Ambulatory Visit: Payer: Medicare Other | Admitting: Cardiology

## 2020-02-10 ENCOUNTER — Inpatient Hospital Stay (HOSPITAL_COMMUNITY)
Admission: RE | Disposition: A | Discharge status: Rehabilitation Facility | Payer: Self-pay | Source: Home / Self Care | Attending: Neurosurgery

## 2020-02-10 ENCOUNTER — Inpatient Hospital Stay (HOSPITAL_COMMUNITY): Payer: Medicare Other

## 2020-02-10 ENCOUNTER — Inpatient Hospital Stay (HOSPITAL_COMMUNITY)
Admission: RE | Admit: 2020-02-10 | Discharge: 2020-02-13 | DRG: 454 | Disposition: A | Discharge status: Rehabilitation Facility | Payer: Medicare Other | Attending: Neurosurgery | Admitting: Neurosurgery

## 2020-02-10 DIAGNOSIS — Z825 Family history of asthma and other chronic lower respiratory diseases: Secondary | ICD-10-CM | POA: Diagnosis not present

## 2020-02-10 DIAGNOSIS — I255 Ischemic cardiomyopathy: Secondary | ICD-10-CM | POA: Diagnosis present

## 2020-02-10 DIAGNOSIS — M5416 Radiculopathy, lumbar region: Secondary | ICD-10-CM | POA: Diagnosis not present

## 2020-02-10 DIAGNOSIS — M4726 Other spondylosis with radiculopathy, lumbar region: Secondary | ICD-10-CM | POA: Diagnosis not present

## 2020-02-10 DIAGNOSIS — I130 Hypertensive heart and chronic kidney disease with heart failure and stage 1 through stage 4 chronic kidney disease, or unspecified chronic kidney disease: Secondary | ICD-10-CM | POA: Diagnosis present

## 2020-02-10 DIAGNOSIS — E1022 Type 1 diabetes mellitus with diabetic chronic kidney disease: Secondary | ICD-10-CM | POA: Diagnosis present

## 2020-02-10 DIAGNOSIS — J449 Chronic obstructive pulmonary disease, unspecified: Secondary | ICD-10-CM | POA: Diagnosis present

## 2020-02-10 DIAGNOSIS — K5900 Constipation, unspecified: Secondary | ICD-10-CM | POA: Diagnosis present

## 2020-02-10 DIAGNOSIS — M4316 Spondylolisthesis, lumbar region: Secondary | ICD-10-CM | POA: Diagnosis present

## 2020-02-10 DIAGNOSIS — I4510 Unspecified right bundle-branch block: Secondary | ICD-10-CM | POA: Diagnosis present

## 2020-02-10 DIAGNOSIS — N183 Chronic kidney disease, stage 3 unspecified: Secondary | ICD-10-CM | POA: Diagnosis present

## 2020-02-10 DIAGNOSIS — M5116 Intervertebral disc disorders with radiculopathy, lumbar region: Secondary | ICD-10-CM | POA: Diagnosis present

## 2020-02-10 DIAGNOSIS — R509 Fever, unspecified: Secondary | ICD-10-CM | POA: Diagnosis not present

## 2020-02-10 DIAGNOSIS — G4700 Insomnia, unspecified: Secondary | ICD-10-CM | POA: Diagnosis present

## 2020-02-10 DIAGNOSIS — Z4789 Encounter for other orthopedic aftercare: Secondary | ICD-10-CM | POA: Diagnosis not present

## 2020-02-10 DIAGNOSIS — I9581 Postprocedural hypotension: Secondary | ICD-10-CM | POA: Diagnosis not present

## 2020-02-10 DIAGNOSIS — I480 Paroxysmal atrial fibrillation: Secondary | ICD-10-CM | POA: Diagnosis present

## 2020-02-10 DIAGNOSIS — Z7902 Long term (current) use of antithrombotics/antiplatelets: Secondary | ICD-10-CM

## 2020-02-10 DIAGNOSIS — R011 Cardiac murmur, unspecified: Secondary | ICD-10-CM | POA: Diagnosis present

## 2020-02-10 DIAGNOSIS — N1831 Chronic kidney disease, stage 3a: Secondary | ICD-10-CM | POA: Diagnosis present

## 2020-02-10 DIAGNOSIS — Z981 Arthrodesis status: Secondary | ICD-10-CM | POA: Diagnosis not present

## 2020-02-10 DIAGNOSIS — Z950 Presence of cardiac pacemaker: Secondary | ICD-10-CM | POA: Diagnosis not present

## 2020-02-10 DIAGNOSIS — D62 Acute posthemorrhagic anemia: Secondary | ICD-10-CM | POA: Diagnosis not present

## 2020-02-10 DIAGNOSIS — R0989 Other specified symptoms and signs involving the circulatory and respiratory systems: Secondary | ICD-10-CM | POA: Diagnosis not present

## 2020-02-10 DIAGNOSIS — Z8249 Family history of ischemic heart disease and other diseases of the circulatory system: Secondary | ICD-10-CM

## 2020-02-10 DIAGNOSIS — I252 Old myocardial infarction: Secondary | ICD-10-CM

## 2020-02-10 DIAGNOSIS — Z79899 Other long term (current) drug therapy: Secondary | ICD-10-CM

## 2020-02-10 DIAGNOSIS — E039 Hypothyroidism, unspecified: Secondary | ICD-10-CM | POA: Diagnosis present

## 2020-02-10 DIAGNOSIS — I5032 Chronic diastolic (congestive) heart failure: Secondary | ICD-10-CM | POA: Diagnosis not present

## 2020-02-10 DIAGNOSIS — N400 Enlarged prostate without lower urinary tract symptoms: Secondary | ICD-10-CM | POA: Diagnosis present

## 2020-02-10 DIAGNOSIS — Z0389 Encounter for observation for other suspected diseases and conditions ruled out: Secondary | ICD-10-CM | POA: Diagnosis not present

## 2020-02-10 DIAGNOSIS — M48062 Spinal stenosis, lumbar region with neurogenic claudication: Secondary | ICD-10-CM | POA: Diagnosis present

## 2020-02-10 DIAGNOSIS — I5022 Chronic systolic (congestive) heart failure: Secondary | ICD-10-CM | POA: Diagnosis present

## 2020-02-10 DIAGNOSIS — Z951 Presence of aortocoronary bypass graft: Secondary | ICD-10-CM | POA: Diagnosis not present

## 2020-02-10 DIAGNOSIS — E559 Vitamin D deficiency, unspecified: Secondary | ICD-10-CM | POA: Diagnosis present

## 2020-02-10 DIAGNOSIS — Z905 Acquired absence of kidney: Secondary | ICD-10-CM | POA: Diagnosis not present

## 2020-02-10 DIAGNOSIS — E669 Obesity, unspecified: Secondary | ICD-10-CM | POA: Diagnosis present

## 2020-02-10 DIAGNOSIS — M7989 Other specified soft tissue disorders: Secondary | ICD-10-CM | POA: Diagnosis not present

## 2020-02-10 DIAGNOSIS — N390 Urinary tract infection, site not specified: Secondary | ICD-10-CM | POA: Diagnosis not present

## 2020-02-10 DIAGNOSIS — E785 Hyperlipidemia, unspecified: Secondary | ICD-10-CM | POA: Diagnosis present

## 2020-02-10 DIAGNOSIS — Z23 Encounter for immunization: Secondary | ICD-10-CM

## 2020-02-10 DIAGNOSIS — Z6834 Body mass index (BMI) 34.0-34.9, adult: Secondary | ICD-10-CM

## 2020-02-10 DIAGNOSIS — E876 Hypokalemia: Secondary | ICD-10-CM | POA: Diagnosis present

## 2020-02-10 DIAGNOSIS — Z87891 Personal history of nicotine dependence: Secondary | ICD-10-CM

## 2020-02-10 DIAGNOSIS — R8290 Unspecified abnormal findings in urine: Secondary | ICD-10-CM | POA: Diagnosis not present

## 2020-02-10 DIAGNOSIS — I5042 Chronic combined systolic (congestive) and diastolic (congestive) heart failure: Secondary | ICD-10-CM | POA: Diagnosis present

## 2020-02-10 DIAGNOSIS — I2510 Atherosclerotic heart disease of native coronary artery without angina pectoris: Secondary | ICD-10-CM | POA: Diagnosis present

## 2020-02-10 DIAGNOSIS — R531 Weakness: Secondary | ICD-10-CM | POA: Diagnosis present

## 2020-02-10 DIAGNOSIS — Z419 Encounter for procedure for purposes other than remedying health state, unspecified: Secondary | ICD-10-CM

## 2020-02-10 DIAGNOSIS — Z4889 Encounter for other specified surgical aftercare: Secondary | ICD-10-CM | POA: Diagnosis not present

## 2020-02-10 DIAGNOSIS — K5903 Drug induced constipation: Secondary | ICD-10-CM | POA: Diagnosis not present

## 2020-02-10 DIAGNOSIS — I5043 Acute on chronic combined systolic (congestive) and diastolic (congestive) heart failure: Secondary | ICD-10-CM | POA: Diagnosis not present

## 2020-02-10 DIAGNOSIS — M48061 Spinal stenosis, lumbar region without neurogenic claudication: Secondary | ICD-10-CM | POA: Diagnosis not present

## 2020-02-10 DIAGNOSIS — M25552 Pain in left hip: Secondary | ICD-10-CM | POA: Diagnosis not present

## 2020-02-10 DIAGNOSIS — Z794 Long term (current) use of insulin: Secondary | ICD-10-CM | POA: Diagnosis not present

## 2020-02-10 DIAGNOSIS — Z95810 Presence of automatic (implantable) cardiac defibrillator: Secondary | ICD-10-CM

## 2020-02-10 DIAGNOSIS — Z79890 Hormone replacement therapy (postmenopausal): Secondary | ICD-10-CM

## 2020-02-10 DIAGNOSIS — N1832 Chronic kidney disease, stage 3b: Secondary | ICD-10-CM | POA: Diagnosis not present

## 2020-02-10 DIAGNOSIS — G8918 Other acute postprocedural pain: Secondary | ICD-10-CM | POA: Diagnosis not present

## 2020-02-10 DIAGNOSIS — E1165 Type 2 diabetes mellitus with hyperglycemia: Secondary | ICD-10-CM | POA: Diagnosis not present

## 2020-02-10 LAB — GLUCOSE, CAPILLARY
Glucose-Capillary: 119 mg/dL — ABNORMAL HIGH (ref 70–99)
Glucose-Capillary: 143 mg/dL — ABNORMAL HIGH (ref 70–99)
Glucose-Capillary: 201 mg/dL — ABNORMAL HIGH (ref 70–99)
Glucose-Capillary: 203 mg/dL — ABNORMAL HIGH (ref 70–99)

## 2020-02-10 LAB — ABO/RH: ABO/RH(D): A POS

## 2020-02-10 SURGERY — POSTERIOR LUMBAR FUSION 2 LEVEL
Anesthesia: General | Site: Spine Lumbar

## 2020-02-10 MED ORDER — LIDOCAINE 2% (20 MG/ML) 5 ML SYRINGE
INTRAMUSCULAR | Status: AC
  Filled 2020-02-10: qty 5

## 2020-02-10 MED ORDER — PHENOL 1.4 % MT LIQD: 1.0000 | OROMUCOSAL | Status: DC | PRN

## 2020-02-10 MED ORDER — MENTHOL 3 MG MT LOZG: 1.0000 | LOZENGE | OROMUCOSAL | Status: DC | PRN

## 2020-02-10 MED ORDER — PROPOFOL 10 MG/ML IV BOLUS
INTRAVENOUS | Status: DC | PRN
  Administered 2020-02-10: 120 mg via INTRAVENOUS

## 2020-02-10 MED ORDER — AMISULPRIDE (ANTIEMETIC) 5 MG/2ML IV SOLN: 10.0000 mg | Freq: Once | INTRAVENOUS | Status: DC | PRN

## 2020-02-10 MED ORDER — ONDANSETRON HCL 4 MG/2ML IJ SOLN
INTRAMUSCULAR | Status: AC
  Filled 2020-02-10: qty 2

## 2020-02-10 MED ORDER — CHLORHEXIDINE GLUCONATE CLOTH 2 % EX PADS: 6.0000 | MEDICATED_PAD | Freq: Once | CUTANEOUS | Status: DC

## 2020-02-10 MED ORDER — LACTATED RINGERS IV SOLN: INTRAVENOUS | Status: DC | PRN

## 2020-02-10 MED ORDER — PHENYLEPHRINE HCL-NACL 10-0.9 MG/250ML-% IV SOLN
INTRAVENOUS | Status: DC | PRN
  Administered 2020-02-10: 25 ug/min via INTRAVENOUS

## 2020-02-10 MED ORDER — LACTATED RINGERS IV SOLN: INTRAVENOUS | Status: DC

## 2020-02-10 MED ORDER — CEFAZOLIN SODIUM-DEXTROSE 2-4 GM/100ML-% IV SOLN
INTRAVENOUS | Status: AC
  Filled 2020-02-10: qty 100

## 2020-02-10 MED ORDER — BACITRACIN ZINC 500 UNIT/GM EX OINT
TOPICAL_OINTMENT | CUTANEOUS | Status: AC
  Filled 2020-02-10: qty 28.35

## 2020-02-10 MED ORDER — LEVOTHYROXINE SODIUM 88 MCG PO TABS
88.0000 ug | ORAL_TABLET | Freq: Every day | ORAL | Status: DC
  Administered 2020-02-11 – 2020-02-13 (×3): 88 ug via ORAL
  Filled 2020-02-10 (×4): qty 1

## 2020-02-10 MED ORDER — ORAL CARE MOUTH RINSE: 15.0000 mL | Freq: Once | OROMUCOSAL | Status: AC

## 2020-02-10 MED ORDER — DEXAMETHASONE SODIUM PHOSPHATE 10 MG/ML IJ SOLN
INTRAMUSCULAR | Status: DC | PRN
  Administered 2020-02-10: 4 mg via INTRAVENOUS

## 2020-02-10 MED ORDER — HYDROMORPHONE HCL 1 MG/ML IJ SOLN
0.2500 mg | INTRAMUSCULAR | Status: DC | PRN
  Administered 2020-02-10: 0.5 mg via INTRAVENOUS
  Administered 2020-02-10: 0.25 mg via INTRAVENOUS

## 2020-02-10 MED ORDER — SODIUM CHLORIDE 0.9 % IV SOLN: INTRAVENOUS | Status: DC | PRN

## 2020-02-10 MED ORDER — SODIUM CHLORIDE 0.9% FLUSH
3.0000 mL | Freq: Two times a day (BID) | INTRAVENOUS | Status: DC
  Administered 2020-02-10 – 2020-02-12 (×5): 3 mL via INTRAVENOUS

## 2020-02-10 MED ORDER — ACETAMINOPHEN 10 MG/ML IV SOLN
1000.0000 mg | Freq: Once | INTRAVENOUS | Status: DC | PRN
  Administered 2020-02-10: 1000 mg via INTRAVENOUS

## 2020-02-10 MED ORDER — BUPIVACAINE LIPOSOME 1.3 % IJ SUSP
20.0000 mL | INTRAMUSCULAR | Status: AC
  Administered 2020-02-10: 20 mL
  Filled 2020-02-10: qty 20

## 2020-02-10 MED ORDER — CHLORHEXIDINE GLUCONATE 0.12 % MT SOLN
OROMUCOSAL | Status: AC
  Administered 2020-02-10: 15 mL via OROMUCOSAL
  Filled 2020-02-10: qty 15

## 2020-02-10 MED ORDER — ACETAMINOPHEN 650 MG RE SUPP: 650.0000 mg | RECTAL | Status: DC | PRN

## 2020-02-10 MED ORDER — TAMSULOSIN HCL 0.4 MG PO CAPS
0.4000 mg | ORAL_CAPSULE | Freq: Every day | ORAL | Status: DC
  Administered 2020-02-10 – 2020-02-13 (×3): 0.4 mg via ORAL
  Filled 2020-02-10 (×4): qty 1

## 2020-02-10 MED ORDER — ROCURONIUM BROMIDE 10 MG/ML (PF) SYRINGE
PREFILLED_SYRINGE | INTRAVENOUS | Status: AC
  Filled 2020-02-10: qty 10

## 2020-02-10 MED ORDER — COQ10 100 MG PO CAPS: 100.0000 mg | ORAL_CAPSULE | Freq: Every day | ORAL | Status: DC

## 2020-02-10 MED ORDER — METOPROLOL SUCCINATE ER 25 MG PO TB24
25.0000 mg | ORAL_TABLET | Freq: Every day | ORAL | Status: DC
  Administered 2020-02-12 – 2020-02-13 (×2): 25 mg via ORAL
  Filled 2020-02-10 (×3): qty 1

## 2020-02-10 MED ORDER — EPHEDRINE 5 MG/ML INJ
INTRAVENOUS | Status: AC
  Filled 2020-02-10: qty 10

## 2020-02-10 MED ORDER — CEFAZOLIN SODIUM-DEXTROSE 2-4 GM/100ML-% IV SOLN
2.0000 g | Freq: Three times a day (TID) | INTRAVENOUS | Status: AC
  Administered 2020-02-10 (×2): 2 g via INTRAVENOUS
  Filled 2020-02-10 (×2): qty 100

## 2020-02-10 MED ORDER — HYDROMORPHONE HCL 2 MG PO TABS
2.0000 mg | ORAL_TABLET | ORAL | Status: DC | PRN
  Administered 2020-02-10 – 2020-02-13 (×10): 2 mg via ORAL
  Filled 2020-02-10 (×10): qty 1

## 2020-02-10 MED ORDER — ROCURONIUM BROMIDE 10 MG/ML (PF) SYRINGE
PREFILLED_SYRINGE | INTRAVENOUS | Status: DC | PRN
  Administered 2020-02-10: 40 mg via INTRAVENOUS
  Administered 2020-02-10: 60 mg via INTRAVENOUS

## 2020-02-10 MED ORDER — THROMBIN 5000 UNITS EX SOLR
OROMUCOSAL | Status: DC | PRN
  Administered 2020-02-10: 5 mL via TOPICAL

## 2020-02-10 MED ORDER — EPHEDRINE SULFATE-NACL 50-0.9 MG/10ML-% IV SOSY
PREFILLED_SYRINGE | INTRAVENOUS | Status: DC | PRN
  Administered 2020-02-10: 10 mg via INTRAVENOUS

## 2020-02-10 MED ORDER — ONDANSETRON HCL 4 MG PO TABS: 4.0000 mg | ORAL_TABLET | Freq: Four times a day (QID) | ORAL | Status: DC | PRN

## 2020-02-10 MED ORDER — AMIODARONE HCL 200 MG PO TABS
200.0000 mg | ORAL_TABLET | Freq: Two times a day (BID) | ORAL | Status: DC
  Administered 2020-02-10 – 2020-02-13 (×5): 200 mg via ORAL
  Filled 2020-02-10 (×6): qty 1

## 2020-02-10 MED ORDER — SODIUM CHLORIDE 0.9% FLUSH: 3.0000 mL | INTRAVENOUS | Status: DC | PRN

## 2020-02-10 MED ORDER — FENTANYL CITRATE (PF) 250 MCG/5ML IJ SOLN
INTRAMUSCULAR | Status: AC
  Filled 2020-02-10: qty 5

## 2020-02-10 MED ORDER — SUGAMMADEX SODIUM 200 MG/2ML IV SOLN
INTRAVENOUS | Status: DC | PRN
  Administered 2020-02-10: 200 mg via INTRAVENOUS

## 2020-02-10 MED ORDER — NITROGLYCERIN 0.4 MG SL SUBL: 0.4000 mg | SUBLINGUAL_TABLET | SUBLINGUAL | Status: DC | PRN

## 2020-02-10 MED ORDER — BISACODYL 10 MG RE SUPP: 10.0000 mg | Freq: Every day | RECTAL | Status: DC | PRN

## 2020-02-10 MED ORDER — DOCUSATE SODIUM 100 MG PO CAPS
100.0000 mg | ORAL_CAPSULE | Freq: Two times a day (BID) | ORAL | Status: DC
  Administered 2020-02-10 – 2020-02-13 (×6): 100 mg via ORAL
  Filled 2020-02-10 (×7): qty 1

## 2020-02-10 MED ORDER — BACITRACIN ZINC 500 UNIT/GM EX OINT
TOPICAL_OINTMENT | CUTANEOUS | Status: DC | PRN
  Administered 2020-02-10: 1 via TOPICAL

## 2020-02-10 MED ORDER — BUPIVACAINE-EPINEPHRINE (PF) 0.5% -1:200000 IJ SOLN
INTRAMUSCULAR | Status: DC | PRN
  Administered 2020-02-10: 10 mL

## 2020-02-10 MED ORDER — 0.9 % SODIUM CHLORIDE (POUR BTL) OPTIME
TOPICAL | Status: DC | PRN
  Administered 2020-02-10: 1000 mL

## 2020-02-10 MED ORDER — MIDAZOLAM HCL 2 MG/2ML IJ SOLN
INTRAMUSCULAR | Status: AC
  Filled 2020-02-10: qty 2

## 2020-02-10 MED ORDER — SUCCINYLCHOLINE CHLORIDE 200 MG/10ML IV SOSY
PREFILLED_SYRINGE | INTRAVENOUS | Status: AC
  Filled 2020-02-10: qty 10

## 2020-02-10 MED ORDER — ATORVASTATIN CALCIUM 40 MG PO TABS
40.0000 mg | ORAL_TABLET | Freq: Every day | ORAL | Status: DC
  Administered 2020-02-10 – 2020-02-13 (×4): 40 mg via ORAL
  Filled 2020-02-10 (×4): qty 1

## 2020-02-10 MED ORDER — MEPERIDINE HCL 25 MG/ML IJ SOLN: 6.2500 mg | INTRAMUSCULAR | Status: DC | PRN

## 2020-02-10 MED ORDER — INSULIN ASPART 100 UNIT/ML ~~LOC~~ SOLN
0.0000 [IU] | Freq: Every day | SUBCUTANEOUS | Status: DC
  Administered 2020-02-10: 2 [IU] via SUBCUTANEOUS

## 2020-02-10 MED ORDER — CEFAZOLIN SODIUM-DEXTROSE 2-4 GM/100ML-% IV SOLN
2.0000 g | INTRAVENOUS | Status: AC
  Administered 2020-02-10: 2 g via INTRAVENOUS

## 2020-02-10 MED ORDER — ONDANSETRON HCL 4 MG/2ML IJ SOLN
INTRAMUSCULAR | Status: DC | PRN
  Administered 2020-02-10: 4 mg via INTRAVENOUS

## 2020-02-10 MED ORDER — ACETAMINOPHEN 10 MG/ML IV SOLN
INTRAVENOUS | Status: AC
  Filled 2020-02-10: qty 100

## 2020-02-10 MED ORDER — CYCLOBENZAPRINE HCL 10 MG PO TABS
ORAL_TABLET | ORAL | Status: AC
  Filled 2020-02-10: qty 1

## 2020-02-10 MED ORDER — PHENYLEPHRINE 40 MCG/ML (10ML) SYRINGE FOR IV PUSH (FOR BLOOD PRESSURE SUPPORT)
PREFILLED_SYRINGE | INTRAVENOUS | Status: AC
  Filled 2020-02-10: qty 10

## 2020-02-10 MED ORDER — DEXAMETHASONE SODIUM PHOSPHATE 10 MG/ML IJ SOLN
INTRAMUSCULAR | Status: AC
  Filled 2020-02-10: qty 1

## 2020-02-10 MED ORDER — HYDROMORPHONE HCL 1 MG/ML IJ SOLN
INTRAMUSCULAR | Status: AC
  Filled 2020-02-10: qty 1

## 2020-02-10 MED ORDER — HEMOSTATIC AGENTS (NO CHARGE) OPTIME
TOPICAL | Status: DC | PRN
  Administered 2020-02-10: 1 via TOPICAL

## 2020-02-10 MED ORDER — ACETAMINOPHEN 500 MG PO TABS
1000.0000 mg | ORAL_TABLET | Freq: Four times a day (QID) | ORAL | Status: AC
  Administered 2020-02-10 – 2020-02-11 (×4): 1000 mg via ORAL
  Filled 2020-02-10 (×4): qty 2

## 2020-02-10 MED ORDER — ACETAMINOPHEN 160 MG/5ML PO SOLN: 325.0000 mg | Freq: Once | ORAL | Status: DC | PRN

## 2020-02-10 MED ORDER — LIDOCAINE 2% (20 MG/ML) 5 ML SYRINGE
INTRAMUSCULAR | Status: DC | PRN
  Administered 2020-02-10: 40 mg via INTRAVENOUS

## 2020-02-10 MED ORDER — CHLORHEXIDINE GLUCONATE 0.12 % MT SOLN: 15.0000 mL | Freq: Once | OROMUCOSAL | Status: AC

## 2020-02-10 MED ORDER — PROPOFOL 10 MG/ML IV BOLUS
INTRAVENOUS | Status: AC
  Filled 2020-02-10: qty 40

## 2020-02-10 MED ORDER — BUPIVACAINE-EPINEPHRINE 0.5% -1:200000 IJ SOLN
INTRAMUSCULAR | Status: AC
  Filled 2020-02-10: qty 1

## 2020-02-10 MED ORDER — SUCCINYLCHOLINE CHLORIDE 200 MG/10ML IV SOSY
PREFILLED_SYRINGE | INTRAVENOUS | Status: DC | PRN
  Administered 2020-02-10: 120 mg via INTRAVENOUS

## 2020-02-10 MED ORDER — SACUBITRIL-VALSARTAN 49-51 MG PO TABS
1.0000 | ORAL_TABLET | Freq: Two times a day (BID) | ORAL | Status: DC
  Administered 2020-02-10 – 2020-02-13 (×5): 1 via ORAL
  Filled 2020-02-10 (×8): qty 1

## 2020-02-10 MED ORDER — INSULIN ASPART 100 UNIT/ML ~~LOC~~ SOLN
0.0000 [IU] | Freq: Three times a day (TID) | SUBCUTANEOUS | Status: DC
  Administered 2020-02-11: 3 [IU] via SUBCUTANEOUS
  Administered 2020-02-11 – 2020-02-12 (×2): 2 [IU] via SUBCUTANEOUS
  Administered 2020-02-12: 3 [IU] via SUBCUTANEOUS
  Administered 2020-02-13: 2 [IU] via SUBCUTANEOUS

## 2020-02-10 MED ORDER — ISOSORBIDE MONONITRATE ER 30 MG PO TB24
30.0000 mg | ORAL_TABLET | Freq: Every morning | ORAL | Status: DC
  Administered 2020-02-12 – 2020-02-13 (×2): 30 mg via ORAL
  Filled 2020-02-10 (×3): qty 1

## 2020-02-10 MED ORDER — SODIUM CHLORIDE 0.9 % IV SOLN
250.0000 mL | INTRAVENOUS | Status: DC
  Administered 2020-02-10: 250 mL via INTRAVENOUS

## 2020-02-10 MED ORDER — ACETAMINOPHEN 325 MG PO TABS: 650.0000 mg | ORAL_TABLET | ORAL | Status: DC | PRN

## 2020-02-10 MED ORDER — MORPHINE SULFATE (PF) 4 MG/ML IV SOLN: 4.0000 mg | INTRAVENOUS | Status: DC | PRN

## 2020-02-10 MED ORDER — ALBUMIN HUMAN 5 % IV SOLN: INTRAVENOUS | Status: DC | PRN

## 2020-02-10 MED ORDER — LINAGLIPTIN 5 MG PO TABS
5.0000 mg | ORAL_TABLET | Freq: Every day | ORAL | Status: DC
  Administered 2020-02-10 – 2020-02-13 (×4): 5 mg via ORAL
  Filled 2020-02-10 (×4): qty 1

## 2020-02-10 MED ORDER — FUROSEMIDE 40 MG PO TABS
40.0000 mg | ORAL_TABLET | Freq: Two times a day (BID) | ORAL | Status: DC
  Administered 2020-02-10 – 2020-02-13 (×6): 40 mg via ORAL
  Filled 2020-02-10 (×6): qty 1

## 2020-02-10 MED ORDER — CYCLOBENZAPRINE HCL 10 MG PO TABS
10.0000 mg | ORAL_TABLET | Freq: Three times a day (TID) | ORAL | Status: DC | PRN
  Administered 2020-02-10 – 2020-02-11 (×2): 10 mg via ORAL
  Filled 2020-02-10 (×3): qty 1

## 2020-02-10 MED ORDER — MIDAZOLAM HCL 5 MG/5ML IJ SOLN
INTRAMUSCULAR | Status: DC | PRN
  Administered 2020-02-10: 1 mg via INTRAVENOUS

## 2020-02-10 MED ORDER — PHENYLEPHRINE 40 MCG/ML (10ML) SYRINGE FOR IV PUSH (FOR BLOOD PRESSURE SUPPORT)
PREFILLED_SYRINGE | INTRAVENOUS | Status: DC | PRN
  Administered 2020-02-10: 120 ug via INTRAVENOUS
  Administered 2020-02-10 (×3): 80 ug via INTRAVENOUS

## 2020-02-10 MED ORDER — THROMBIN 5000 UNITS EX SOLR
CUTANEOUS | Status: AC
  Filled 2020-02-10: qty 5000

## 2020-02-10 MED ORDER — ONDANSETRON HCL 4 MG/2ML IJ SOLN: 4.0000 mg | Freq: Four times a day (QID) | INTRAMUSCULAR | Status: DC | PRN

## 2020-02-10 MED ORDER — ACETAMINOPHEN 325 MG PO TABS: 325.0000 mg | ORAL_TABLET | Freq: Once | ORAL | Status: DC | PRN

## 2020-02-10 MED ORDER — FENTANYL CITRATE (PF) 250 MCG/5ML IJ SOLN
INTRAMUSCULAR | Status: DC | PRN
  Administered 2020-02-10 (×6): 50 ug via INTRAVENOUS

## 2020-02-10 SURGICAL SUPPLY — 65 items
APL SKNCLS STERI-STRIP NONHPOA (GAUZE/BANDAGES/DRESSINGS) ×1
BENZOIN TINCTURE PRP APPL 2/3 (GAUZE/BANDAGES/DRESSINGS) ×2 IMPLANT
BLADE CLIPPER SURG (BLADE) ×2 IMPLANT
BUR MATCHSTICK NEURO 3.0 LAGG (BURR) ×2 IMPLANT
BUR PRECISION FLUTE 6.0 (BURR) ×2 IMPLANT
CAGE ALTERA 10X31X9-13 15D (Cage) ×4 IMPLANT
CANISTER SUCT 3000ML PPV (MISCELLANEOUS) ×2 IMPLANT
CAP LOCK DLX THRD (Cap) ×12 IMPLANT
CARTRIDGE OIL MAESTRO DRILL (MISCELLANEOUS) ×1 IMPLANT
CNTNR URN SCR LID CUP LEK RST (MISCELLANEOUS) ×1 IMPLANT
CONT SPEC 4OZ STRL OR WHT (MISCELLANEOUS) ×2
COVER BACK TABLE 60X90IN (DRAPES) ×2 IMPLANT
COVER WAND RF STERILE (DRAPES) IMPLANT
DECANTER SPIKE VIAL GLASS SM (MISCELLANEOUS) ×2 IMPLANT
DIFFUSER DRILL AIR PNEUMATIC (MISCELLANEOUS) ×2 IMPLANT
DRAPE C-ARM 42X72 X-RAY (DRAPES) ×4 IMPLANT
DRAPE HALF SHEET 40X57 (DRAPES) ×2 IMPLANT
DRAPE LAPAROTOMY 100X72X124 (DRAPES) ×2 IMPLANT
DRAPE SURG 17X23 STRL (DRAPES) ×8 IMPLANT
DRSG OPSITE POSTOP 4X8 (GAUZE/BANDAGES/DRESSINGS) ×2 IMPLANT
ELECT BLADE 4.0 EZ CLEAN MEGAD (MISCELLANEOUS) ×4
ELECT REM PT RETURN 9FT ADLT (ELECTROSURGICAL) ×2
ELECTRODE BLDE 4.0 EZ CLN MEGD (MISCELLANEOUS) ×2 IMPLANT
ELECTRODE REM PT RTRN 9FT ADLT (ELECTROSURGICAL) ×1 IMPLANT
GAUZE 4X4 16PLY RFD (DISPOSABLE) IMPLANT
GLOVE BIO SURGEON STRL SZ 6.5 (GLOVE) ×12 IMPLANT
GLOVE BIO SURGEON STRL SZ7.5 (GLOVE) ×4 IMPLANT
GLOVE BIO SURGEON STRL SZ8 (GLOVE) ×4 IMPLANT
GLOVE BIO SURGEON STRL SZ8.5 (GLOVE) ×4 IMPLANT
GLOVE BIOGEL PI IND STRL 6.5 (GLOVE) ×4 IMPLANT
GLOVE BIOGEL PI INDICATOR 6.5 (GLOVE) ×4
GLOVE EXAM NITRILE XL STR (GLOVE) IMPLANT
GOWN STRL REUS W/ TWL LRG LVL3 (GOWN DISPOSABLE) ×4 IMPLANT
GOWN STRL REUS W/ TWL XL LVL3 (GOWN DISPOSABLE) ×2 IMPLANT
GOWN STRL REUS W/TWL 2XL LVL3 (GOWN DISPOSABLE) IMPLANT
GOWN STRL REUS W/TWL LRG LVL3 (GOWN DISPOSABLE) ×8
GOWN STRL REUS W/TWL XL LVL3 (GOWN DISPOSABLE) ×4
HEMOSTAT POWDER KIT SURGIFOAM (HEMOSTASIS) ×2 IMPLANT
KIT BASIN OR (CUSTOM PROCEDURE TRAY) ×2 IMPLANT
KIT TURNOVER KIT B (KITS) ×2 IMPLANT
MILL MEDIUM DISP (BLADE) IMPLANT
NEEDLE HYPO 21X1.5 SAFETY (NEEDLE) ×2 IMPLANT
NEEDLE HYPO 22GX1.5 SAFETY (NEEDLE) ×2 IMPLANT
NS IRRIG 1000ML POUR BTL (IV SOLUTION) ×2 IMPLANT
OIL CARTRIDGE MAESTRO DRILL (MISCELLANEOUS) ×2
PACK LAMINECTOMY NEURO (CUSTOM PROCEDURE TRAY) ×2 IMPLANT
PAD ARMBOARD 7.5X6 YLW CONV (MISCELLANEOUS) ×12 IMPLANT
PATTIES SURGICAL .5 X1 (DISPOSABLE) IMPLANT
PATTIES SURGICAL 1X1 (DISPOSABLE) ×4 IMPLANT
PUTTY DBM 10CC CALC GRAN (Putty) ×2 IMPLANT
PUTTY DBM 5CC CALC GRAN ×2 IMPLANT
ROD CURVED TI 6.35X70 (Rod) ×4 IMPLANT
SCREW PA DLX CREO 7.5X55 (Screw) ×12 IMPLANT
SPONGE LAP 4X18 RFD (DISPOSABLE) IMPLANT
SPONGE NEURO XRAY DETECT 1X3 (DISPOSABLE) ×2 IMPLANT
SPONGE SURGIFOAM ABS GEL 100 (HEMOSTASIS) IMPLANT
STRIP CLOSURE SKIN 1/2X4 (GAUZE/BANDAGES/DRESSINGS) ×2 IMPLANT
SUT VIC AB 1 CT1 18XBRD ANBCTR (SUTURE) ×2 IMPLANT
SUT VIC AB 1 CT1 8-18 (SUTURE) ×4
SUT VIC AB 2-0 CP2 18 (SUTURE) ×4 IMPLANT
SYR 20ML LL LF (SYRINGE) ×2 IMPLANT
TOWEL GREEN STERILE (TOWEL DISPOSABLE) ×2 IMPLANT
TOWEL GREEN STERILE FF (TOWEL DISPOSABLE) ×2 IMPLANT
TRAY FOLEY MTR SLVR 16FR STAT (SET/KITS/TRAYS/PACK) ×2 IMPLANT
WATER STERILE IRR 1000ML POUR (IV SOLUTION) ×2 IMPLANT

## 2020-02-10 NOTE — Anesthesia Procedure Notes (Signed)
Procedure Name: Intubation Date/Time: 02/10/2020 8:16 AM Performed by: Colin Benton, CRNA Pre-anesthesia Checklist: Patient identified, Emergency Drugs available, Suction available and Patient being monitored Patient Re-evaluated:Patient Re-evaluated prior to induction Oxygen Delivery Method: Circle system utilized Preoxygenation: Pre-oxygenation with 100% oxygen Induction Type: IV induction Ventilation: Mask ventilation without difficulty Laryngoscope Size: Miller and 2 Grade View: Grade I Tube type: Oral Tube size: 7.5 mm Number of attempts: 1 Airway Equipment and Method: Stylet and Oral airway Placement Confirmation: ETT inserted through vocal cords under direct vision,  positive ETCO2 and breath sounds checked- equal and bilateral Secured at: 24 cm Tube secured with: Tape Dental Injury: Teeth and Oropharynx as per pre-operative assessment

## 2020-02-10 NOTE — Transfer of Care (Signed)
Immediate Anesthesia Transfer of Care Note  Patient: James [name].  Procedure(s) Performed: POSTERIOR LUMBAR FUSION, INTERBODY PROSTHESIS, POSTERIOR INSTRUMENTATION LUMBAR THREE- LUMBAR FOUR, LUMBAR FOUR- LUMBAR FIVE (N/A Spine Lumbar)  Patient Location: PACU  Anesthesia Type:General  Level of Consciousness: drowsy  Airway & Oxygen Therapy: Patient Spontanous Breathing and Patient connected to face mask oxygen  Post-op Assessment: Report given to RN and Post -op Vital signs reviewed and stable  Post vital signs: Reviewed and stable  Last Vitals:  Vitals Value Taken Time  BP 150/75 02/10/20 1311  Temp    Pulse 71 02/10/20 1314  Resp 15 02/10/20 1314  SpO2 98 % 02/10/20 1314  Vitals shown include unvalidated device data.  Last Pain:  Vitals:   02/10/20 0659  TempSrc:   PainSc: 0-No pain         Complications: No complications documented.

## 2020-02-10 NOTE — Progress Notes (Signed)
Orthopedic Tech Progress Note Patient Details:  James [name] [birth year] [id number] Called in order to [facility] for an [place] Patient ID: James Brisk., male   DOB: Oct 10, 1950, 69 y.o.   MRN: [id number]   James [name] 02/10/2020, 2:44 PM

## 2020-02-10 NOTE — H&P (Signed)
Subjective: The patient is a 69 year old white male who has complained of back and right greater left leg pain consistent with neurogenic claudication.  He has failed medical management and was worked up with a lumbar MRI and lumbar x-rays which demonstrated the patient has L3-4 and L4-5 spinal listhesis, spinal stenosis, etc.  I discussed the various treatment options with him.  He has decided proceed with surgery.  Past Medical History:  Diagnosis Date  . Abnormal nuclear stress test 11/03/2017   Added automatically from request for surgery [phone]  . Atrial arrhythmia 02/15/2014  . Benign hypertension with chronic kidney disease, stage III ([place]) 04/11/2018  . CAD in native artery 08/27/2014  . Cardiac dysrhythmia 02/12/2014  . Chronic systolic congestive heart failure ([place]) 10/26/2017  . CKD stage 3 due to type 1 diabetes mellitus ([place]) 08/28/2014   Overview:  Cr 1.3 at discharge 08/21/14  . COPD GOLD II 10/17/2014   Followed in Pulmonary clinic/ [facility]/ [place]  - PFTs   10/17/2014 FEV1  2.09 ( 53%) ratio 62 no sign better p B2 and dlco 63% and corrects to 85%  - 10/17/2014 p extensive coaching HFA effectiveness =  90%    > try stiolto    . Coronary artery disease involving native coronary artery of native heart with angina pectoris ([place]) 08/27/2014  . DM (diabetes mellitus) ([place])   . Dyspnea 09/05/2014   Followed in Pulmonary clinic/ [facility]/ [place] - 09/05/2014  Walked RA x 3 laps @ 185 ft each stopped due to end of study, nl pace, no desat   - pfts 10/17/14 dlco 63% on Amiodarone > may benefit from 6 month f/u but defer to Cards    . Essential hypertension 09/11/2014   Changed acei to ARB  09/05/14 due to pseudowheeze> improved 10/17/14    . Heart failure ([place])   . Hyperlipidemia   . Hypertension   . Hypertensive heart disease with congestive heart failure ([place]) 08/27/2014  . ICD (implantable cardioverter-defibrillator) in place 08/27/2014  . Metabolic bone disease 62/37/6283  . NSTEMI  (non-ST elevated myocardial infarction) ([place]) 02/12/2014  . Obesity 1/51/7616   Complicated by dm/ hbp and low erv on pfts 10/17/14    . On amiodarone therapy 08/27/2014  . Renal oncocytoma of right kidney 01/29/2018  . S/P ICD (internal cardiac defibrillator) procedure 02/15/2014  . Systolic CHF, chronic ([place]) 08/27/2014   Overview:  BNP 1350 at discharge K 3.9 on 08/21/14 Echo EF 35% May 2016  . Typical atrial flutter ([place]) 08/27/2014  . Ventricular tachyarrhythmia ([place]) 08/27/2014  . Vitamin D deficiency 01/17/2018    Past Surgical History:  Procedure Laterality Date  . CORONARY ARTERY BYPASS GRAFT  2008  . EYE SURGERY    . ICD IMPLANT    . MOHS SURGERY     Right Arm  . NEPHRECTOMY Right     Allergies  Allergen Reactions  . Baclofen Other (See Comments)    Mental status changes  . Codeine Rash    Social History   Tobacco Use  . Smoking status: Former Smoker    Packs/day: 1.00    Years: 38.00    Pack years: 38.00    Types: Cigarettes    Quit date: 04/04/2006    Years since quitting: 13.8  . Smokeless tobacco: Never Used  Substance Use Topics  . Alcohol use: No    Alcohol/week: 0.0 standard drinks    Comment: Rarely    Family History  Problem Relation Age of Onset  .  Heart disease Mother   . Breast cancer Mother   . Heart disease Father   . Cancer Daughter   . COPD Brother    Prior to Admission medications   Medication Sig Start Date End Date Taking? Authorizing Provider  acetaminophen (TYLENOL) 500 MG tablet Take 500 mg by mouth 2 (two) times daily.    Yes [provider]  amiodarone (PACERONE) 200 MG tablet Take 200 mg by mouth 2 (two) times daily.   Yes [provider]  Ascorbic Acid (VITAMIN C) 1000 MG tablet Take 1,000 mg by mouth 2 (two) times daily.   Yes [provider]  atorvastatin (LIPITOR) 40 MG tablet TAKE 1 TABLET BY MOUTH ONCE DAILY. *NEEDS APPOINTMENT FOR REFILLS* Patient taking differently: Take 40 mg by mouth daily.   06/28/19  Yes Richardo Priest, MD  b complex vitamins capsule Take 1 capsule by mouth daily.   Yes [provider]  clopidogrel (PLAVIX) 75 MG tablet Take 1 tablet (75 mg total) by mouth daily. 03/05/19  Yes Richardo Priest, MD  Coenzyme Q10 (COQ10) 100 MG CAPS Take 100 mg by mouth daily.   Yes [provider]  ergocalciferol (VITAMIN D2) 1.25 MG (50000 UT) capsule Take 50,000 Units by mouth every Monday.  03/08/19  Yes [provider]  furosemide (LASIX) 40 MG tablet TAKE 1 TABLET BY MOUTH TWICE(2) DAILY Patient taking differently: Take 40 mg by mouth 2 (two) times daily.  03/05/19  Yes Richardo Priest, MD  insulin detemir (LEVEMIR) 100 UNIT/ML injection Inject 10 Units into the skin daily.    Yes [provider]  isosorbide mononitrate (IMDUR) 30 MG 24 hr tablet Take 1 tablet by mouth every morning. (Hold if blood pressure under 100 sbp)   Yes [provider]  levothyroxine (SYNTHROID, LEVOTHROID) 88 MCG tablet Take 88 mcg by mouth daily before breakfast.  12/22/15  Yes [provider]  LORazepam (ATIVAN) 0.5 MG tablet Take 0.5 mg by mouth See admin instructions. Take 0.5 mg at bedtime may take a second 0.5 mg during the day as needed for anxiety 09/13/17  Yes [provider]  metoprolol succinate (TOPROL XL) 25 MG 24 hr tablet Take 1 tablet (25 mg total) by mouth daily. 01/13/20  Yes Richardo Priest, MD  Multiple Vitamin (MULTIVITAMIN) capsule Take 1 capsule by mouth daily.   Yes [provider]  [facility] powder Apply 1 application topically daily as needed for irritation. 09/13/19  Yes [provider]  omega-3 acid ethyl esters (LOVAZA) 1 g capsule Take 2 capsules (2 g total) by mouth in the morning and at bedtime. Patient taking differently: Take 1 g by mouth in the morning and at bedtime.  11/26/19  Yes Richardo Priest, MD  sacubitril-valsartan (ENTRESTO) 49-51 MG Take 1 tablet by mouth 2 (two) times daily. 01/13/20  Yes  Richardo Priest, MD  senna (SENOKOT) 8.6 MG tablet Take 2 tablets by mouth daily.    Yes [provider]  sitaGLIPtin (JANUVIA) 25 MG tablet Take 25 mg by mouth daily.    Yes [provider]  tamsulosin (FLOMAX) 0.4 MG CAPS capsule Take 0.4 mg by mouth daily.  08/27/19  Yes [provider]  Zinc 50 MG TABS Take 50 mg by mouth daily.    Yes [provider]  magnesium hydroxide (MILK OF MAGNESIA) 400 MG/5ML suspension Take 15 mLs by mouth daily as needed for mild constipation.    [provider]  melatonin  3 MG TABS tablet Take 6 mg by mouth at bedtime.     [provider]  nitroGLYCERIN (NITROSTAT) 0.4 MG SL tablet Place 1 tablet (0.4 mg total) under the tongue every 5 (five) minutes as needed for chest pain. 03/05/19   Richardo Priest, MD     Review of Systems  Positive ROS: As above  All other systems have been reviewed and were otherwise negative with the exception of those mentioned in the HPI and as above.  Objective: Vital signs in last 24 hours: Temp:  [97.8 F (36.6 C)] 97.8 F (36.6 C) (11/08 0604) Pulse Rate:  [64] 64 (11/08 0604) Resp:  [20] 20 (11/08 0604) BP: (141)/(68) 141/68 (11/08 0604) SpO2:  [99 %] 99 % (11/08 0604) Estimated body mass index is 34.39 kg/m as calculated from the following:   Height as of 02/06/20: 5\' 8"  (1.727 m).   Weight as of 02/06/20: 102.6 kg.   General Appearance: Alert Head: Normocephalic, without obvious abnormality, atraumatic Eyes: PERRL, conjunctiva/corneas clear, EOM's intact,    Ears: Normal  Throat: Normal  Neck: Supple, Back: unremarkable Lungs: Clear to auscultation bilaterally, respirations unlabored Heart: Regular rate and rhythm, no murmur, rub or gallop Abdomen: Soft, non-tender Extremities: Extremities normal, atraumatic, no cyanosis or edema Skin: unremarkable  NEUROLOGIC:   Mental status: alert and oriented,Motor Exam - grossly normal Sensory Exam - grossly  normal Reflexes:  Coordination - grossly normal Gait - grossly normal Balance - grossly normal Cranial Nerves: I: smell Not tested  II: visual acuity  OS: Normal  OD: Normal   II: visual fields Full to confrontation  II: pupils Equal, round, reactive to light  III,VII: ptosis None  III,IV,VI: extraocular muscles  Full ROM  V: mastication Normal  V: facial light touch sensation  Normal  V,VII: corneal reflex  Present  VII: facial muscle function - upper  Normal  VII: facial muscle function - lower Normal  VIII: hearing Not tested  IX: soft palate elevation  Normal  IX,X: gag reflex Present  XI: trapezius strength  5/5  XI: sternocleidomastoid strength 5/5  XI: neck flexion strength  5/5  XII: tongue strength  Normal    Data Review Lab Results  Component Value Date   WBC 8.0 02/06/2020   HGB 13.8 02/06/2020   HCT 42.1 02/06/2020   MCV 93.6 02/06/2020   PLT 222 02/06/2020   Lab Results  Component Value Date   NA 139 02/06/2020   K 4.0 02/06/2020   CL 99 02/06/2020   CO2 29 02/06/2020   BUN 28 (H) 02/06/2020   CREATININE 1.50 (H) 02/06/2020   GLUCOSE 122 (H) 02/06/2020   No results found for: INR, PROTIME  Assessment/Plan: L3-4 and L4-5 spinal stenosis, lumbago, lumbar radiculopathy, neurogenic claudication, facet arthropathy: I have discussed the situation with the patient.  I have reviewed his imaging studies with him and pointed out the abnormalities.  We have discussed the various treatment options including surgery.  I have described the surgical treatment option of a L3-4 and L4-5 decompression, instrumentation and fusion.  I have shown him surgical models.  I have given him a surgical pamphlet.  We have discussed the risk, benefits, alternatives, expected postop course, and likelihood of achieving our goals with surgery.  I have answered all his questions.  He has decided to proceed with surgery.   Ophelia Charter 02/10/2020 7:18 AM

## 2020-02-10 NOTE — Evaluation (Signed)
Physical Therapy Evaluation Patient Details Name: James [name]. MRN: [id number] DOB: October 14, 1950 Today's Date: 02/10/2020   History of Present Illness  69 year old male who has complained of back and right greater left leg pain consistent with neurogenic claudication.  He has failed medical management. Patient s/p lumbar fusion on 11/8. Extensive PMH, see H&P.  Clinical Impression  Prior to surgery, patient required assistance for mobility and ADLs due to increased back pain and R LE pain. Patient used rollator for ambulation. Patient currently presents with generalized weakness, impaired balance, impaired functional mobility, decreased activity tolerance. Patient required modA+2 for supine to sit and maxA to return to supine. Patient required maxA+2 for sit to stand transfer with RW and modA for 2 steps fwd with RW. Encouraged wife to participate with mobility to gauge ability of providing necessary assistance, wife demos difficulty providing heavy assist. Recommend CIR following discharge to maximize functional mobility and independence. Per wife, if patient is unable to be accepted at [facility], she wants him to return home with HHPT and not to a SNF due to previous experience. Discussed with patient's wife about differences in therapy schedule between CIR vs SNF vs HHPT.       Follow Up Recommendations CIR;Supervision/Assistance - 24 hour (If denied CIR, wife wants HHPT, does not want SNF)    Equipment Recommendations  Rolling [name] with 5" wheels    Recommendations for Other Services Rehab consult     Precautions / Restrictions Precautions Precautions: Back Precaution Booklet Issued: Yes (comment) Required Braces or Orthoses: Spinal Brace Spinal Brace: Lumbar corset Restrictions Weight Bearing Restrictions: No      Mobility  Bed Mobility Overal bed mobility: Needs Assistance Bed Mobility: Supine to Sit;Sit to Supine     Supine to sit: Mod assist;+2 for physical assistance;HOB  elevated Sit to supine: Max assist   General bed mobility comments: instructed patient on log roll technique, required assistance to bring B LEs off bed and bring trunk upright during supine to sit; maxA for sit to supine with assist for B LEs onto bed; totalA +2 for repositioning in bed    Transfers Overall transfer level: Needs assistance Equipment used: Rolling [name] (2 wheeled) Transfers: Sit to/from Stand Sit to Stand: Max assist;+2 physical assistance;+2 safety/equipment;From elevated surface         General transfer comment: maxA+2 for sit to stand with RW, cues required for hand placement and upright posture in standing  Ambulation/Gait Ambulation/Gait assistance: Mod assist Gait Distance (Feet): 1 Feet Assistive device: Rolling [name] (2 wheeled) Gait Pattern/deviations: Step-to pattern     General Gait Details: 2 small steps fwd, patient unable to take steps backwards and began to sit on bed with uncontrolled descent  Stairs            Wheelchair Mobility    Modified Rankin (Stroke Patients Only)       Balance Overall balance assessment: Needs assistance Sitting-balance support: Bilateral upper extremity supported;Feet supported Sitting balance-Leahy Scale: Poor     Standing balance support: Bilateral upper extremity supported;During functional activity Standing balance-Leahy Scale: Poor                               Pertinent Vitals/Pain Pain Assessment: Faces Faces Pain Scale: Hurts even more Pain Location: low back Pain Descriptors / Indicators: Grimacing;Guarding;Operative site guarding Pain Intervention(s): Limited activity within patient's tolerance;Monitored during session;Repositioned    Home Living Family/patient expects to be discharged to::  Private residence Living Arrangements: Spouse/significant other Available Help at Discharge: Family;Available 24 hours/day Type of Home: House Home Access: Stairs to enter Entrance  Stairs-Rails: None Entrance Stairs-Number of Steps: 1 Home Layout: One level (with basement) Home Equipment: [name] - 4 wheels;Toilet riser;[name] - 2 wheels      Prior Function Level of Independence: Needs assistance   Gait / Transfers Assistance Needed: use of rollator, wife states needs assistance at times to get out of chair without arms  ADL's / Homemaking Assistance Needed: wife performs bathing recently   Comments: patient has been declining recently due to pain, unable to stand upright prior to surgery.      Hand Dominance        Extremity/Trunk Assessment   Upper Extremity Assessment Upper Extremity Assessment: Defer to OT evaluation    Lower Extremity Assessment Lower Extremity Assessment: Generalized weakness    Cervical / Trunk Assessment Cervical / Trunk Assessment: Kyphotic  Communication   Communication: No difficulties  Cognition Arousal/Alertness: Awake/alert Behavior During Therapy: Flat affect Overall Cognitive Status: Within Functional Limits for tasks assessed                                        General Comments General comments (skin integrity, edema, etc.): Wife present and willing to provide assist, encouraged wife to assist PT to gauge ability to provide necessary assistance at home.    Exercises     Assessment/Plan    PT Assessment Patient needs continued PT services  PT Problem List Decreased strength;Decreased activity tolerance;Decreased balance;Decreased mobility;Decreased safety awareness;Pain       PT Treatment Interventions DME instruction;Gait training;Stair training;Functional mobility training;Therapeutic activities;Therapeutic exercise;Balance training;Patient/family education;Wheelchair mobility training    PT Goals (Current goals can be found in the Care Plan section)  Acute Rehab PT Goals Patient Stated Goal: to get stronger PT Goal Formulation: With patient/family Time For Goal Achievement:  02/24/20 Potential to Achieve Goals: Fair    Frequency Min 5X/week   Barriers to discharge   wife is unwilling to send patient to SNF due to past experience in March, interested in [facility] however if denied, wants to return home with HHPT    Co-evaluation               AM-PAC PT "6 Clicks" Mobility  Outcome Measure Help needed turning from your back to your side while in a flat bed without using bedrails?: A Lot Help needed moving from lying on your back to sitting on the side of a flat bed without using bedrails?: A Lot Help needed moving to and from a bed to a chair (including a wheelchair)?: Total Help needed standing up from a chair using your arms (e.g., wheelchair or bedside chair)?: A Lot Help needed to walk in hospital room?: Total Help needed climbing 3-5 steps with a railing? : Total 6 Click Score: 9    End of Session Equipment Utilized During Treatment: Gait belt;Back brace Activity Tolerance: Patient limited by fatigue Patient left: in bed;with call bell/phone within reach;with family/visitor present Nurse Communication: Mobility status PT Visit Diagnosis: Unsteadiness on feet (R26.81);Other abnormalities of gait and mobility (R26.89);Muscle weakness (generalized) (M62.81);Difficulty in walking, not elsewhere classified (R26.2);Pain Pain - part of body:  (low back)    Time: 0865-7846 PT Time Calculation (min) (ACUTE ONLY): 34 min   Charges:   PT Evaluation $PT Eval Moderate Complexity: 1 Mod PT Treatments $Therapeutic  Activity: 8-22 mins        Perrin Maltese, PT, DPT Acute Rehabilitation Services Pager [phone] Office [phone]   Alda Lea 02/10/2020, 3:58 PM

## 2020-02-10 NOTE — Op Note (Signed)
Brief history: The patient is a 69 year old white male who has complained of back and leg pain consistent with neurogenic claudication.  He has failed medical management and was worked up with a lumbar MRI which demonstrated L3-4 and L4-5 spinal listhesis, facet arthropathy, spinal stenosis, synovial cyst, etc.  I discussed the various treatment options with him.  He has decided proceed with surgery after weighing the risk, benefits and alternatives.  Preoperative diagnosis: L3-4 and L4-5 spondylolisthesis, facet arthropathy, degenerative disc disease, spinal stenosis compressing both the L4 and the L5 nerve roots; lumbago; lumbar radiculopathy; neurogenic claudication  Postoperative diagnosis: The same  Procedure: Bilateral L3-4 and L4-5 laminotomy/foraminotomies/medial facetectomy to decompress the bilateral L3, L4 and L5 nerve roots(the work required to do this was in addition to the work required to do the posterior lumbar interbody fusion because of the patient's spinal stenosis, facet arthropathy. Etc. requiring a wide decompression of the nerve roots.);  L3-4 and L4-5 transforaminal lumbar interbody fusion with local morselized autograft bone and Zimmer DBM; insertion of interbody prosthesis at L3-4 and L4-5 (globus peek expandable interbody prosthesis); posterior segmental instrumentation from L3 to L5 with globus titanium pedicle screws and rods; posterior lateral arthrodesis at L3-4 and L4-5 with local morselized autograft bone and Zimmer DBM.  Surgeon: Dr. Earle Gell  Asst.: Arnetha Massy, NP  Anesthesia: Gen. endotracheal  Estimated blood loss: 350 cc  Drains: None  Complications: None  Description of procedure: The patient was brought to the operating room by the anesthesia team. General endotracheal anesthesia was induced. The patient was turned to the prone position on the Wilson frame. The patient's lumbosacral region was then prepared with Betadine scrub and Betadine solution.  Sterile drapes were applied.  I then injected the area to be incised with Marcaine with epinephrine solution. I then used the scalpel to make a linear midline incision over the L3-4 and L4-5 interspace. I then used electrocautery to perform a bilateral subperiosteal dissection exposing the spinous process and lamina of L3, L4 and L5. We then obtained intraoperative radiograph to confirm our location. We then inserted the Verstrac retractor to provide exposure.  I began the decompression by using the high speed drill to perform laminotomies at L3-4 and L4-5 bilaterally. We then used the Kerrison punches to widen the laminotomy and removed the ligamentum flavum at L3-4 and L4-5 bilaterally. We used the Kerrison punches to remove the medial facets at L3-4 and L4-5 bilaterally, we remove the right L3-4 and L4-5 facet. We performed wide foraminotomies about the bilateral L3, L4 and L5 nerve roots completing the decompression.  We now turned our attention to the posterior lumbar interbody fusion. I used a scalpel to incise the intervertebral disc at L3-4 and L4-5 bilaterally. I then performed a partial intervertebral discectomy at L3-4 and L4-5 bilaterally using the pituitary forceps. We prepared the vertebral endplates at W7-3 and X1-0 bilaterally for the fusion by removing the soft tissues with the curettes. We then used the trial spacers to pick the appropriate sized interbody prosthesis. We prefilled his prosthesis with a combination of local morselized autograft bone that we obtained during the decompression as well as Zimmer DBM. We inserted the prefilled prosthesis into the interspace at L3-4 and L4-5 from the right, we then turned and expanded the prosthesis. There was a good snug fit of the prosthesis in the interspace. We then filled and the remainder of the intervertebral disc space with local morselized autograft bone and Zimmer DBM. This completed the posterior lumbar interbody  arthrodesis.  During the  decompression and insertion of the prosthesis the assistant protected the thecal sac and nerve roots with the D'Errico retractor.  We now turned attention to the instrumentation. Under fluoroscopic guidance we cannulated the bilateral L3, L4 and L5 pedicles with the bone probe. We then removed the bone probe. We then tapped the pedicle with a 6.5 millimeter tap. We then removed the tap. We probed inside the tapped pedicle with a ball probe to rule out cortical breaches. We then inserted a 7.5 x 55 millimeter pedicle screw into the L3, L4 and L5 pedicles bilaterally under fluoroscopic guidance. We then palpated along the medial aspect of the pedicles to rule out cortical breaches. There were none. The nerve roots were not injured. We then connected the unilateral pedicle screws with a lordotic rod. We compressed the construct and secured the rod in place with the caps. We then tightened the caps appropriately. This completed the instrumentation from L3-L5 bilaterally.  We now turned our attention to the posterior lateral arthrodesis at L3-4 and L4-5 bilaterally. We used the high-speed drill to decorticate the remainder of the facets, pars, transverse process at L3-4 and L4-5 bilaterally. We then applied a combination of local morselized autograft bone and Zimmer DBM over these decorticated posterior lateral structures. This completed the posterior lateral arthrodesis.  We then obtained hemostasis using bipolar electrocautery. We irrigated the wound out with bacitracin solution. We inspected the thecal sac and nerve roots and noted they were well decompressed. We then removed the retractor.  We injected Exparel . We reapproximated patient's thoracolumbar fascia with interrupted #1 Vicryl suture. We reapproximated patient's subcutaneous tissue with interrupted 2-0 Vicryl suture. The reapproximated patient's skin with Steri-Strips and benzoin. The wound was then coated with bacitracin ointment. A sterile dressing  was applied. The drapes were removed. The patient was subsequently returned to the supine position where they were extubated by the anesthesia team. He was then transported to the post anesthesia care unit in stable condition. All sponge instrument and needle counts were reportedly correct at the end of this case.

## 2020-02-11 ENCOUNTER — Other Ambulatory Visit: Payer: Self-pay

## 2020-02-11 LAB — CBC
HCT: 36 % — ABNORMAL LOW (ref 39.0–52.0)
Hemoglobin: 11.7 g/dL — ABNORMAL LOW (ref 13.0–17.0)
MCH: 30.4 pg (ref 26.0–34.0)
MCHC: 32.5 g/dL (ref 30.0–36.0)
MCV: 93.5 fL (ref 80.0–100.0)
Platelets: 192 10*3/uL (ref 150–400)
RBC: 3.85 MIL/uL — ABNORMAL LOW (ref 4.22–5.81)
RDW: 14.3 % (ref 11.5–15.5)
WBC: 11.1 10*3/uL — ABNORMAL HIGH (ref 4.0–10.5)
nRBC: 0 % (ref 0.0–0.2)

## 2020-02-11 LAB — GLUCOSE, CAPILLARY
Glucose-Capillary: 152 mg/dL — ABNORMAL HIGH (ref 70–99)
Glucose-Capillary: 116 mg/dL — ABNORMAL HIGH (ref 70–99)
Glucose-Capillary: 125 mg/dL — ABNORMAL HIGH (ref 70–99)
Glucose-Capillary: 105 mg/dL — ABNORMAL HIGH (ref 70–99)

## 2020-02-11 LAB — BASIC METABOLIC PANEL
Anion gap: 11 (ref 5–15)
BUN: 32 mg/dL — ABNORMAL HIGH (ref 8–23)
CO2: 25 mmol/L (ref 22–32)
Calcium: 8.9 mg/dL (ref 8.9–10.3)
Chloride: 104 mmol/L (ref 98–111)
Creatinine, Ser: 1.39 mg/dL — ABNORMAL HIGH (ref 0.61–1.24)
GFR, Estimated: 55 mL/min — ABNORMAL LOW (ref >60)
Glucose, Bld: 162 mg/dL — ABNORMAL HIGH (ref 70–99)
Potassium: 3.9 mmol/L (ref 3.5–5.1)
Sodium: 140 mmol/L (ref 135–145)

## 2020-02-11 NOTE — PMR Pre-admission (Signed)
PMR Admission Coordinator Pre-Admission Assessment  Patient: James [name]. is an 69 y.o., male MRN: [id number] DOB: 05-02-1950 Height: 5' 8"  (172.7 cm) Weight: 103.5 kg  Insurance Information HMO:     PPO:      PCP:      IPA:      80/20: yes     OTHER:  PRIMARY: medicare A and B      Policy#: [id number]      Subscriber: patient CM Name:       Phone#:      Fax#:  Pre-Cert#:       Employer:  Benefits:  Phone #: online     Name: verified eligibility via [place] on 02/11/20 Eff. Date: Part A and B effective 03/04/2016     Deduct: $1,484      Out of Pocket Max:      Life Max:  CIR: Covered per Medicare guidelines once yearly deductible has been met      SNF: days 1-20, 100%, days 21-100, 80% Outpatient: 80%     Co-Pay: 20% Home Health: 100%      Co-Pay:  DME: 80%     Co-Pay: 20% Providers: Pt's choice SECONDARY: BCBS       Policy#: [id number]     Phone#: [phone]  Financial Counselor:       Phone#:   The "Data Collection Information Summary" for patients in Inpatient Rehabilitation Facilities with attached "Privacy Act [place] Records" was provided and verbally reviewed with: Patient and Family  Emergency Contact Information Contact Information    Name Relation Home Work Mobile   [place] Spouse   [phone]      Current Medical History  Patient Admitting Diagnosis:L3-4 and L4-5 spinal stenosis, lumbago, lumbar radiculopathy, neurogenic claudication, facet arthropathy s/p decompression and fusion  History of Present Illness: James [name], [place]. is a 69 year old right-handed male with history of CAD/non-STEMI/CABG /ICD 2015 maintained on amiodarone as well as Plavix followed by cardiology services Dr. Bettina Gavia in [facility], chronic systolic congestive heart failure maintained on Entresto, CKD stage III as well as right nephrectomy 2019, COPD, diabetes mellitus, hypertension, hyperlipidemia.  Per chart review patient lives with spouse.  1 level  home one-step to entry.  Used a Rollator prior to admission.  Wife does assist with some ADLs.  Presented 02/10/2020 with progressive low back pain radiating to lower extremities right greater than left.  MRI demonstrated L3-4 and L4-5 spinal listhesis facet arthropathy spinal stenosis with radiculopathy.  Patient underwent bilateral L3-4 and 4-5 laminotomy foraminotomy to decompress bilateral L3-4-5 nerve roots with posterior lumbar interbody fusion as well as transforaminal lumbar interbody fusion 02/10/2020 per Dr. Arnoldo Morale.  Lumbar corset when out of bed.  Await plan to resume Plavix therapy.  Therapy evaluations completed and patient is to be admitted for a comprehensive rehab program on 02/13/20.     Patient's medical record from [facility] has been reviewed by the rehabilitation admission coordinator and physician.  Past Medical History  Past Medical History:  Diagnosis Date  . Abnormal nuclear stress test 11/03/2017   Added automatically from request for surgery [id number]  . Benign hypertension with chronic kidney disease, stage III ([place]) 04/11/2018  . CAD in native artery 08/27/2014  . Chronic systolic congestive heart failure ([place]) 10/26/2017  . CKD stage 3 due to type 1 diabetes mellitus ([place]) 08/28/2014   Overview:  Cr 1.3 at discharge 08/21/14  . COPD GOLD II 10/17/2014   Followed in Pulmonary clinic/  [facility]/ [place]  - PFTs   10/17/2014 FEV1  2.09 ( 53%) ratio 62 no sign better p B2 and dlco 63% and corrects to 85%  - 10/17/2014 p extensive coaching HFA effectiveness =  90%    > try stiolto    . DM (diabetes mellitus) ([place])   . Dyspnea 09/05/2014   Followed in Pulmonary clinic/ [facility]/ [place] - 09/05/2014  Walked RA x 3 laps @ 185 ft each stopped due to end of study, nl pace, no desat   - pfts 10/17/14 dlco 63% on Amiodarone > may benefit from 6 month f/u but defer to Cards    . Essential hypertension 09/11/2014   Changed acei to ARB  09/05/14 due to pseudowheeze>  improved 10/17/14    . Hyperlipidemia   . Hypertension   . Hypertensive heart disease with congestive heart failure ([place]) 08/27/2014  . ICD (implantable cardioverter-defibrillator) in place 08/27/2014  . Metabolic bone disease 95/28/4132  . NSTEMI (non-ST elevated myocardial infarction) ([place]) 02/12/2014  . Obesity 4/40/1027   Complicated by dm/ hbp and low erv on pfts 10/17/14    . On amiodarone therapy 08/27/2014  . Renal oncocytoma of right kidney 01/29/2018  . S/P ICD (internal cardiac defibrillator) procedure 02/15/2014   followed by [facility]  . Typical atrial flutter ([place]) 08/27/2014  . Ventricular tachyarrhythmia ([place]) 08/27/2014  . Vitamin D deficiency 01/17/2018    Family History   family history includes Breast cancer in his mother; COPD in his brother; Cancer in his daughter; Heart disease in his father and mother.  Prior Rehab/Hospitalizations Has the patient had prior rehab or hospitalizations prior to admission? No  Has the patient had major surgery during 100 days prior to admission? Yes   Current Medications  Current Facility-Administered Medications:  .  0.9 %  sodium chloride infusion, 250 mL, Intravenous, Continuous, Newman Pies, MD, Last Rate: 1 mL/hr at 02/10/20 1645, 250 mL at 02/10/20 1645 .  acetaminophen (TYLENOL) tablet 650 mg, 650 mg, Oral, Q4H PRN **OR** acetaminophen (TYLENOL) suppository 650 mg, 650 mg, Rectal, Q4H PRN, Newman Pies, MD .  amiodarone (PACERONE) tablet 200 mg, 200 mg, Oral, BID, Newman Pies, MD, 200 mg at 02/13/20 1059 .  atorvastatin (LIPITOR) tablet 40 mg, 40 mg, Oral, Daily, Newman Pies, MD, 40 mg at 02/13/20 1059 .  bisacodyl (DULCOLAX) suppository 10 mg, 10 mg, Rectal, Daily PRN, Newman Pies, MD .  cyclobenzaprine (FLEXERIL) tablet 10 mg, 10 mg, Oral, TID PRN, Newman Pies, MD, 10 mg at 02/11/20 2026 .  docusate sodium (COLACE) capsule 100 mg, 100 mg, Oral, BID, Newman Pies, MD, 100 mg at 02/13/20  1059 .  furosemide (LASIX) tablet 40 mg, 40 mg, Oral, BID, Newman Pies, MD, 40 mg at 02/13/20 1059 .  HYDROmorphone (DILAUDID) tablet 2-4 mg, 2-4 mg, Oral, Q4H PRN, Newman Pies, MD, 2 mg at 02/13/20 2536 .  insulin aspart (novoLOG) injection 0-15 Units, 0-15 Units, Subcutaneous, TID WC, Newman Pies, MD, 3 Units at 02/12/20 1850 .  insulin aspart (novoLOG) injection 0-5 Units, 0-5 Units, Subcutaneous, QHS, Newman Pies, MD, 2 Units at 02/10/20 2200 .  isosorbide mononitrate (IMDUR) 24 hr tablet 30 mg, 30 mg, Oral, q morning - 10a, Newman Pies, MD, 30 mg at 02/13/20 1059 .  levothyroxine (SYNTHROID) tablet 88 mcg, 88 mcg, Oral, QAC breakfast, Newman Pies, MD, 88 mcg at 02/13/20 0607 .  linagliptin (TRADJENTA) tablet 5 mg, 5 mg, Oral, Daily, Newman Pies, MD, 5 mg at 02/13/20 1059 .  menthol-cetylpyridinium (CEPACOL) lozenge 3 mg, 1 lozenge, Oral, PRN **OR** phenol (CHLORASEPTIC) mouth spray 1 spray, 1 spray, Mouth/Throat, PRN, Newman Pies, MD .  metoprolol succinate (TOPROL-XL) 24 hr tablet 25 mg, 25 mg, Oral, Daily, Newman Pies, MD, 25 mg at 02/13/20 1059 .  morphine 4 MG/ML injection 4 mg, 4 mg, Intravenous, Q2H PRN, Newman Pies, MD .  nitroGLYCERIN (NITROSTAT) SL tablet 0.4 mg, 0.4 mg, Sublingual, Q5 min PRN, Newman Pies, MD .  ondansetron [facility]) tablet 4 mg, 4 mg, Oral, Q6H PRN **OR** ondansetron (ZOFRAN) injection 4 mg, 4 mg, Intravenous, Q6H PRN, Newman Pies, MD .  pneumococcal 23 valent vaccine (PNEUMOVAX-23) injection 0.5 mL, 0.5 mL, Intramuscular, Tomorrow-1000, Newman Pies, MD .  sacubitril-valsartan (ENTRESTO) 49-51 mg per tablet, 1 tablet, Oral, BID, Newman Pies, MD, 1 tablet at 02/13/20 1059 .  sodium chloride flush (NS) 0.9 % injection 3 mL, 3 mL, Intravenous, Q12H, Newman Pies, MD, 3 mL at 02/12/20 2054 .  sodium chloride flush (NS) 0.9 % injection 3 mL, 3 mL, Intravenous, PRN, Newman Pies, MD .  tamsulosin  [facility]) capsule 0.4 mg, 0.4 mg, Oral, Daily, Newman Pies, MD, 0.4 mg at 02/13/20 1059  Patients Current Diet:  Diet Order            Diet - low sodium heart healthy           Diet Carb Modified Fluid consistency: Thin; Room service appropriate? Yes  Diet effective now                 Precautions / Restrictions Precautions Precautions: Back Precaution Booklet Issued: Yes (comment) Spinal Brace: Lumbar corset Restrictions Weight Bearing Restrictions: No   Has the patient had 2 or more falls or a fall with injury in the past year? No  Prior Activity Level    Prior Functional Level Self Care: Did the patient need help bathing, dressing, using the toilet or eating? Needed some help  Indoor Mobility: Did the patient need assistance with walking from room to room (with or without device)? Independent  Stairs: Did the patient need assistance with internal or external stairs (with or without device)? Needed some help  Functional Cognition: Did the patient need help planning regular tasks such as shopping or remembering to take medications? [place] Devices/Equipment: Gilford Rile (specify type) Home Equipment: Walker - 4 wheels, Toilet riser, Walker - 2 wheels  Prior Device Use: Indicate devices/aids used by the patient prior to current illness, exacerbation or injury? None of the above  Current Functional Level Cognition  Overall Cognitive Status: Impaired/Different from baseline Orientation Level: Oriented X4 Safety/Judgement: Decreased awareness of safety, Decreased awareness of deficits General Comments: Pt able to recall 2/3 BLT precautions with cues patient able to name lifting.  Cues required throughout mobility for sequencing and safety     Extremity Assessment (includes Sensation/Coordination)  Upper Extremity Assessment: Generalized weakness, LUE deficits/detail LUE Deficits / Details: active shoulder ROM to  75*, PROM +10 mor degrees with crepitation LUE: Unable to fully assess due to pain LUE Sensation: WNL LUE Coordination: decreased gross motor  Lower Extremity Assessment: Defer to PT evaluation    ADLs  Overall ADL's : Needs assistance/impaired Eating/Feeding: Set up, Sitting Grooming: Set up, Sitting Upper Body Bathing: Minimal assistance, Sitting Lower Body Bathing: Sit to/from stand, Maximal assistance Upper Body Dressing : Minimal assistance, Sitting Lower Body Dressing: Total assistance, Sit to/from stand Lower Body Dressing Details (indicate cue type and reason): Total A  to don socks. With increased time/effort, able to cross R LE but difficulty in doing so Toilet Transfer: Maximal assistance, +2 for physical assistance, +2 for safety/equipment, Stand-pivot, RW Toilet Transfer Details (indicate cue type and reason): simaulted to recliner Toileting- Clothing Manipulation and Hygiene: Total assistance, Sit to/from stand General ADL Comments: Limited by decreased B LE strength, poor standing balance and back precautions but improved pain in comparison to pre-surgery     Mobility  Overal bed mobility: Needs Assistance Bed Mobility: Supine to Sit Rolling: Min guard Supine to sit: Max assist, +2 for physical assistance, +2 for safety/equipment, HOB elevated Sit to supine: Max assist Sit to sidelying: Mod assist, +2 for physical assistance General bed mobility comments: cues for log roll technique. Assist required for LEs and trunk descent    Transfers  Overall transfer level: Needs assistance Equipment used: Rolling walker (2 wheeled) Transfers: Sit to/from Stand Sit to Stand: Mod assist, +2 physical assistance Stand pivot transfers: +2 safety/equipment, Min assist General transfer comment: Mod A +2 to stand from lower surface. Pt with improved steadiness, only requiring min A +2 for steadying to transfer back to bed. Required multimodal cues for sequencing.     Ambulation / Gait  / Stairs / Wheelchair Mobility  Ambulation/Gait Ambulation/Gait assistance: Herbalist (Feet): 2 Feet Assistive device: Rolling walker (2 wheeled) Gait Pattern/deviations: Step-to pattern General Gait Details: 2 small steps fwd, 2-3 sidesteps towards HOB. Assist required for RW management    Posture / Balance Balance Overall balance assessment: Needs assistance Sitting-balance support: Feet supported, No upper extremity supported Sitting balance-Leahy Scale: Fair Standing balance support: Bilateral upper extremity supported, During functional activity Standing balance-Leahy Scale: Poor Standing balance comment: reliant on UE support    Special needs/care consideration Continuous Drip IV: no,  Skin: surgical incision to back  Diabetic management: yes   Previous Home Environment (from acute therapy documentation) Living Arrangements: Spouse/significant other Available Help at Discharge: Family, Available 24 hours/day Type of Home: House Home Layout: One level, Laundry or work area in basement Home Access: Stairs to enter Entrance Stairs-Rails: None Technical brewer of Steps: 1 Bathroom Shower/Tub: Chiropodist: Standard (3 in 1 over toilet) [place]: No  Discharge Living Setting Plans for Discharge Living Setting: House Type of Home at Discharge: House Discharge Home Layout: Two level, Able to live on main level with bedroom/bathroom Alternate Level Stairs-Rails: None Alternate Level Stairs-Number of Steps: NA Discharge Home Access: Stairs to enter Entrance Stairs-Rails: None Entrance Stairs-Number of Steps: 1 Discharge Bathroom Shower/Tub: Tub/shower unit Discharge Bathroom Toilet: Standard Discharge Bathroom Accessibility: Yes How Accessible: Accessible via walker Does the patient have any problems obtaining your medications?: No  Social/Family/Support Systems Patient Roles: Spouse Contact Information: wife: nancy  [phone] Anticipated Caregiver: wife+ daughter + sister Anticipated Caregiver's Contact Information: see above for [name] Ability/Limitations of Caregiver: Min A Caregiver Availability: 24/7 Discharge Plan Discussed with Primary Caregiver: Yes Is Caregiver In Agreement with Plan?: Yes (with wife ) Does Caregiver/Family have Issues with Lodging/Transportation while Pt is in Rehab?: No  Goals Patient/Family Goal for Rehab: PT/OT: Min A; SLP: NA Expected length of stay: 12-16 days Pt/Family Agrees to Admission and willing to participate: Yes Program Orientation Provided & Reviewed with Pt/Caregiver Including Roles  & Responsibilities: Yes (with pt and his wife )  Barriers to Discharge: Home environment access/layout  Barriers to Discharge Comments: steps to enter  Decrease burden of Care through IP rehab admission: OtherNA  Possible need for SNF  placement upon discharge: Not anticipated; pt has good social support at DC from family. Pt aware plan is to DC straight home with social support.   Patient Condition: I have reviewed medical records from [facility], spoken with MD, RN, and patient and spouse. I met with patient at the bedside for inpatient rehabilitation assessment.  Patient will benefit from ongoing PT and OT, can actively participate in 3 hours of therapy a day 5 days of the week, and can make measurable gains during the admission.  Patient will also benefit from the coordinated team approach during an Inpatient Acute Rehabilitation admission.  The patient will receive intensive therapy as well as Rehabilitation physician, nursing, social worker, and care management interventions.  Due to safety, skin/wound care, disease management, medication administration, pain management and patient education the patient requires 24 hour a day rehabilitation nursing.  The patient is currently Mod A +2 for transfers and Min A for 2 feet using RW with mobility and Min A to Total A  for basic ADLs.  Discharge setting and therapy post discharge at home with home health is anticipated.  Patient has agreed to participate in the Acute Inpatient Rehabilitation Program and will admit 02/13/20.  Preadmission Screen Completed By:  Raechel Ache, 02/13/2020 11:28 AM ______________________________________________________________________   Discussed status with Dr. Dagoberto Ligas on 02/13/20 at 11:27AM and received approval for admission today.  Admission Coordinator:  Raechel Ache, OT, time 11:27AM/Date 02/13/20.   Assessment/Plan: Diagnosis: 1. Does the need for close, 24 hr/day Medical supervision in concert with the patient's rehab needs make it unreasonable for this patient to be served in a less intensive setting? Yes 2. Co-Morbidities requiring supervision/potential complications: HTN, CHF, ICD, COPD, CKD, DM, CAD s/p CABG 3. Due to bladder management, bowel management, safety, skin/wound care, disease management, medication administration, pain management and patient education, does the patient require 24 hr/day rehab nursing? Yes 4. Does the patient require coordinated care of a physician, rehab nurse, PT, OT, and SLP to address physical and functional deficits in the context of the above medical diagnosis(es)? Yes Addressing deficits in the following areas: balance, endurance, locomotion, strength, transferring, bowel/bladder control, bathing, dressing, feeding, grooming and toileting 5. Can the patient actively participate in an intensive therapy program of at least 3 hrs of therapy 5 days a week? Yes 6. The potential for patient to make measurable gains while on inpatient rehab is good 7. Anticipated functional outcomes upon discharge from inpatient rehab: min assist PT, min assist OT, n/a SLP 8. Estimated rehab length of stay to reach the above functional goals is: 12-16 days  9. Anticipated discharge destination: Home 10. Overall Rehab/Functional Prognosis: good   MD  Signature:

## 2020-02-11 NOTE — Progress Notes (Signed)
Subjective: The patient is alert and pleasant and in no apparent distress.  He feels weak and is difficult to walk.  He is interested in rehab.  Objective: Vital signs in last 24 hours: Temp:  [97.5 F (36.4 C)-98.3 F (36.8 C)] 98 F (36.7 C) (11/09 0413) Pulse Rate:  [59-71] 62 (11/09 0413) Resp:  [9-18] 17 (11/09 0413) BP: (97-150)/(53-75) 97/53 (11/09 0413) SpO2:  [93 %-99 %] 93 % (11/09 0413) Estimated body mass index is 34.39 kg/m as calculated from the following:   Height as of 02/06/20: 5\' 8"  (1.727 m).   Weight as of 02/06/20: 102.6 kg.   Intake/Output from previous day: 11/08 0701 - 11/09 0700 In: 2021.3 [P.O.:100; I.V.:1411.3; Blood:160; IV Piggyback:350] Out: 2170 [Urine:1770; Blood:400] Intake/Output this shift: No intake/output data recorded.  Physical exam the patient is alert and pleasant.  His strength is normal his lower extremities except for some slight weakness in his right EHL.  Lab Results: Recent Labs    02/11/20 0135  WBC 11.1*  HGB 11.7*  HCT 36.0*  PLT 192   BMET Recent Labs    02/11/20 0135  NA 140  K 3.9  CL 104  CO2 25  GLUCOSE 162*  BUN 32*  CREATININE 1.39*  CALCIUM 8.9    Studies/Results: DG Lumbar Spine 2-3 Views  Result Date: 02/10/2020 CLINICAL DATA:  Posterior fusion EXAM: LUMBAR SPINE - 2-3 VIEW; DG C-ARM 1-60 MIN COMPARISON:  January 07, 2020 FLUOROSCOPY TIME:  0 minutes 22 seconds; 19.85 mGy; 2 acquired images FINDINGS: Frontal and lateral views were obtained. There are pedicle screws at L3, L4, and L5 bilaterally with screw tips in respective vertebral bodies. Disc spacers noted at L3-4 and L4-5. No fracture evident. Mild spondylolisthesis at L4-5 appear stable. No new spondylolisthesis. IMPRESSION: Postoperative pedicle screw placement at L3, L4, and L5 as well as disc spacers at L3-4 and L4-5. Pedicle screws in respective vertebral bodies. Stable mild spondylolisthesis at L4-5 with screw placement in this area. No fracture  evident. Electronically Signed   By: Lowella Grip III M.D.   On: 02/10/2020 12:33   DG Lumbar Spine 1 View  Result Date: 02/10/2020 CLINICAL DATA:  Intraoperative localization image EXAM: LUMBAR SPINE - 1 VIEW COMPARISON:  01/07/2020, 12/13/2019 FINDINGS: Lateral radiograph of the lumbar spine was obtained with surgical retractors and instruments at the L3-4 interspace. Numbering nomenclature is similar to that utilized on prior MRI examination. IMPRESSION: Intraoperative localization at L3-4. Electronically Signed   By: Inez Catalina M.D.   On: 02/10/2020 14:10   DG C-Arm 1-60 Min  Result Date: 02/10/2020 CLINICAL DATA:  Posterior fusion EXAM: LUMBAR SPINE - 2-3 VIEW; DG C-ARM 1-60 MIN COMPARISON:  January 07, 2020 FLUOROSCOPY TIME:  0 minutes 22 seconds; 19.85 mGy; 2 acquired images FINDINGS: Frontal and lateral views were obtained. There are pedicle screws at L3, L4, and L5 bilaterally with screw tips in respective vertebral bodies. Disc spacers noted at L3-4 and L4-5. No fracture evident. Mild spondylolisthesis at L4-5 appear stable. No new spondylolisthesis. IMPRESSION: Postoperative pedicle screw placement at L3, L4, and L5 as well as disc spacers at L3-4 and L4-5. Pedicle screws in respective vertebral bodies. Stable mild spondylolisthesis at L4-5 with screw placement in this area. No fracture evident. Electronically Signed   By: Lowella Grip III M.D.   On: 02/10/2020 12:33    Assessment/Plan: Postop day #1: The patient is doing well.  I think he would benefit from rehab.  I will asked the  rehab team to see him.  LOS: 1 day     James [name] 02/11/2020, 7:23 AM

## 2020-02-11 NOTE — Progress Notes (Signed)
Dr Arnoldo Morale' medical asst called back ;informed her about the hypotensive episodes; she'll inform primary MD about it; patient currently sitting on a chair; latest BP- 104/50.

## 2020-02-11 NOTE — Evaluation (Signed)
Occupational Therapy Evaluation Patient Details Name: James [name]. MRN: [id number] DOB: 27-Aug-1950 Today's Date: 02/11/2020    History of Present Illness 69 year old male who has complained of back and right greater left leg pain consistent with neurogenic claudication.  He has failed medical management. Patient s/p lumbar fusion on 11/8. Extensive PMH, see H&P.   Clinical Impression   PTA, pt lives with wife and secondary to increasing back pain, received assistance for ADLs (bathing, dressing, tub transfers) and completed limited mobility with Rollator. Pt presents now s/p surgery with decreased pain and deficits in balance, strength, endurance and chronic impaired L shoulder ROM. Pt's wife present and supportive throughout session. Pt able to recall BLT back precautions but requires cues to implement during tasks. Pt overall Max A x 2 for bed mobility and stand pivot with RW to recliner chair. Pt able to take steps toward recliner but halfway through transfer, pt reports feeling of falling requiring increased assist. Pt overall Min A for UB ADLs and Total A for LB ADLs. Anticipate pt to progress well given decreased back pain after surgery. Recommend CIR for intensive therapies to maximize independence and decrease caregiver strain. Plan to further address ADL transfers and back precautions during ADLs, including AE as appropriate.     Follow Up Recommendations  CIR;Supervision/Assistance - 24 hour    Equipment Recommendations  Other (comment) (to be decided)    Recommendations for Other Services Rehab consult     Precautions / Restrictions Precautions Precautions: Back Precaution Booklet Issued: Yes (comment) Required Braces or Orthoses: Spinal Brace Spinal Brace: Lumbar corset Restrictions Weight Bearing Restrictions: No      Mobility Bed Mobility Overal bed mobility: Needs Assistance Bed Mobility: Supine to Sit     Supine to sit: Max assist;+2 for physical  assistance;+2 for safety/equipment;HOB elevated     General bed mobility comments: Instructed in log roll, able to bend knee and assisted to sidelying but heavy assist needed for advancement of trunk and BLE off of bed    Transfers Overall transfer level: Needs assistance Equipment used: Rolling walker (2 wheeled) Transfers: Sit to/from [facility] Sit to Stand: Mod assist;+2 physical assistance;+2 safety/equipment;From elevated surface Stand pivot transfers: Max assist;+2 physical assistance;+2 safety/equipment       General transfer comment: Grossly Mod A x 2 for 2 sit to stand trials with RW, cues to correct posterior sway. Pt initially taking steps with decreased assist towards recliner but mid-transfer, pt reporting feeling of falling - Max A x 2 for safe pivot into recliner though pt without LOB noted     Balance Overall balance assessment: Needs assistance Sitting-balance support: Bilateral upper extremity supported;Feet supported Sitting balance-Leahy Scale: Fair     Standing balance support: Bilateral upper extremity supported;During functional activity Standing balance-Leahy Scale: Poor Standing balance comment: reliant on external support                           ADL either performed or assessed with clinical judgement   ADL Overall ADL's : Needs assistance/impaired Eating/Feeding: Set up;Sitting   Grooming: Set up;Sitting   Upper Body Bathing: Minimal assistance;Sitting   Lower Body Bathing: Sit to/from stand;Maximal assistance   Upper Body Dressing : Minimal assistance;Sitting   Lower Body Dressing: Total assistance;Sit to/from stand Lower Body Dressing Details (indicate cue type and reason): Total A to don socks. With increased time/effort, able to cross R LE but difficulty in doing so Toilet Transfer: Maximal  assistance;+2 for physical assistance;+2 for safety/equipment;Stand-pivot;RW Toilet Transfer Details (indicate cue type and  reason): simaulted to recliner Toileting- Clothing Manipulation and Hygiene: Total assistance;Sit to/from stand         General ADL Comments: Limited by decreased B LE strength, poor standing balance and back precautions but improved pain in comparison to pre-surgery      Vision Baseline Vision/History: Wears glasses Wears Glasses: Reading only Patient Visual Report: Blurring of vision;No change from baseline Vision Assessment?: No apparent visual deficits Additional Comments: reports blurring of vision, keeps eyes closed most of session because uncomfortable to have them open     Perception     Praxis      Pertinent Vitals/Pain Pain Assessment: Faces Faces Pain Scale: Hurts a little bit Pain Location: low back Pain Descriptors / Indicators: Grimacing;Guarding;Operative site guarding Pain Intervention(s): Monitored during session     Hand Dominance Right   Extremity/Trunk Assessment Upper Extremity Assessment Upper Extremity Assessment: Generalized weakness;LUE deficits/detail LUE Deficits / Details: active shoulder ROM to 75*, PROM +10 mor degrees with crepitation LUE: Unable to fully assess due to pain LUE Sensation: WNL LUE Coordination: decreased gross motor   Lower Extremity Assessment Lower Extremity Assessment: Defer to PT evaluation   Cervical / Trunk Assessment Cervical / Trunk Assessment: Kyphotic   Communication Communication Communication: No difficulties   Cognition Arousal/Alertness: Awake/alert Behavior During Therapy: Flat affect Overall Cognitive Status: Impaired/Different from baseline Area of Impairment: Memory;Safety/judgement;Problem solving;Awareness                     Memory: Decreased short-term memory   Safety/Judgement: Decreased awareness of safety;Decreased awareness of deficits Awareness: Emergent Problem Solving: Slow processing;Decreased initiation;Difficulty sequencing;Requires verbal cues;Requires tactile cues General  Comments: Pt with increased alertness, though still lethargic/flat appearing. Pt able to recall BLT precautions, requires repetition of sequencing and safety cues for effective follow-through   General Comments  Per RN, pt with soft BP readings today. Assessed orthostatic readings, 95/50 lying, 111/58 sitting EOB, and 124/70 in standing. Pt denied any dizziness    Exercises     Shoulder Instructions      Home Living Family/patient expects to be discharged to:: Private residence Living Arrangements: Spouse/significant other Available Help at Discharge: Family;Available 24 hours/day Type of Home: House Home Access: Stairs to enter [employer] of Steps: 1 Entrance Stairs-Rails: None Home Layout: One level;Laundry or work area in basement     [employer] Shower/Tub: Teacher, early years/pre: Standard (3 in 1 over toilet)     Home Equipment: Environmental consultant - 4 wheels;Toilet riser;Walker - 2 wheels          Prior Functioning/Environment Level of Independence: Needs assistance  Gait / Transfers Assistance Needed: use of rollator, wife states needs assistance at times to get out of chair without arms ADL's / Homemaking Assistance Needed: wife has been assisting with bathing, dressing, tub transfers due to increased back pain   Comments: patient has been declining recently due to pain, unable to stand upright prior to surgery.         OT Problem List: Decreased strength;Decreased activity tolerance;Impaired balance (sitting and/or standing);Decreased range of motion;Decreased cognition;Decreased safety awareness;Decreased knowledge of use of DME or AE;Decreased knowledge of precautions;Pain      OT Treatment/Interventions: Self-care/ADL training;Therapeutic exercise;DME and/or AE instruction;Therapeutic activities;Patient/family education;Balance training    OT Goals(Current goals can be found in the care plan section) Acute Rehab OT Goals Patient Stated Goal: to be able  to walk more OT  Goal Formulation: With patient Time For Goal Achievement: 02/25/20 Potential to Achieve Goals: Good ADL Goals Pt Will Perform Grooming: with min assist;standing Pt Will Perform Lower Body Bathing: with min assist;sit to/from stand;sitting/lateral leans Pt Will Perform Lower Body Dressing: with min assist;with adaptive equipment;sitting/lateral leans;sit to/from stand Pt Will Transfer to Toilet: stand pivot transfer;bedside commode;with min assist Pt Will Perform Toileting - Clothing Manipulation and hygiene: with min assist;sitting/lateral leans;sit to/from stand  OT Frequency: Min 2X/week   Barriers to D/C:            Co-evaluation              AM-PAC OT "6 Clicks" Daily Activity     Outcome Measure Help from another person eating meals?: A Little Help from another person taking care of personal grooming?: A Little Help from another person toileting, which includes using toliet, bedpan, or urinal?: Total Help from another person bathing (including washing, rinsing, drying)?: A Lot Help from another person to put on and taking off regular upper body clothing?: A Little Help from another person to put on and taking off regular lower body clothing?: Total 6 Click Score: 13   End of Session Equipment Utilized During Treatment: Gait belt;Rolling walker;Back brace Nurse Communication: Mobility status  Activity Tolerance: Patient tolerated treatment well Patient left: in chair;with call bell/phone within reach;with family/visitor present  OT Visit Diagnosis: Unsteadiness on feet (R26.81);Other abnormalities of gait and mobility (R26.89);Muscle weakness (generalized) (M62.81);Pain Pain - Right/Left: Left (back) Pain - part of body:  (back )                Time: 3614-4315 OT Time Calculation (min): 37 min Charges:  OT General Charges $OT Visit: 1 Visit OT Evaluation $OT Eval Moderate Complexity: 1 Mod  Layla Maw, OTR/L  Layla Maw 02/11/2020, 1:03 PM

## 2020-02-11 NOTE — Progress Notes (Signed)
Inpatient Rehabilitation-Admissions Coordinator   CIR consult received. Met with pt bedside for rehab assessment. Feel pt is a good candidate for CIR at this time. Discussed recommended rehab program, expectations, estimated LOS, and anticipated functional outcomes. The pt would like to proceed with IP Rehab here at [facility]. With his permission, I also spoke with his wife; she confirmed DC support. All questions answered. AC will pursue CIR admit once medically ready and pending rehab bed availability.   Raechel Ache, OTR/L  Rehab Admissions Coordinator  [phone] 02/11/2020 12:12 PM

## 2020-02-11 NOTE — Progress Notes (Signed)
Left a voicemail for Dr Arnoldo Morale' medical asst. at [facility]; waiting for a call back

## 2020-02-11 NOTE — Progress Notes (Signed)
Physical Therapy Treatment Patient Details Name: James [name]. MRN: [id number] DOB: Dec 15, 1950 Today's Date: 02/11/2020    History of Present Illness 69 year old male who has complained of back and right greater left leg pain consistent with neurogenic claudication.  He has failed medical management. Patient s/p lumbar fusion on 11/8. Extensive PMH, see H&P.    PT Comments    Patient progressing towards physical therapy goals. Patient continues to require heavy assist for mobility. Patient required maxA+2 for bed mobility, modA+2 for sit to stand transfer x 2 with RW. Patient performed stand pivot transfer with maxA+2 and RW, patient noted feeling like he was going to fall which required increased assist to get to chair. Patient continues to be limited by pain, decreased activity tolerance, impaired balance, generalized weakness (especially R LE), and impaired functional mobility. Continue to recommend comprehensive inpatient rehab (CIR) for post-acute therapy needs.    Follow Up Recommendations  CIR;Supervision/Assistance - 24 hour     Equipment Recommendations  Rolling Kasim Mccorkle with 5" wheels    Recommendations for Other Services       Precautions / Restrictions Precautions Precautions: Back Precaution Booklet Issued: Yes (comment) Required Braces or Orthoses: Spinal Brace Spinal Brace: Lumbar corset Restrictions Weight Bearing Restrictions: No    Mobility  Bed Mobility Overal bed mobility: Needs Assistance Bed Mobility: Supine to Sit     Supine to sit: Max assist;+2 for physical assistance;+2 for safety/equipment;HOB elevated     General bed mobility comments: Instructed in log roll, able to bend knee and assisted to sidelying but heavy assist needed for advancement of trunk and BLE off of bed  Transfers Overall transfer level: Needs assistance Equipment used: Rolling Harel Repetto (2 wheeled) Transfers: Sit to/from [facility] Sit to Stand: Mod assist;+2  physical assistance;+2 safety/equipment;From elevated surface Stand pivot transfers: Max assist;+2 physical assistance;+2 safety/equipment       General transfer comment: Grossly Mod A x 2 for 2 sit to stand trials with RW, cues to correct posterior sway. Pt initially taking steps with decreased assist towards recliner but mid-transfer, pt reporting feeling of falling - Max A x 2 for safe pivot into recliner though pt without LOB noted   Ambulation/Gait                 Stairs             Wheelchair Mobility    Modified Rankin (Stroke Patients Only)       Balance Overall balance assessment: Needs assistance Sitting-balance support: Bilateral upper extremity supported;Feet supported Sitting balance-Leahy Scale: Fair     Standing balance support: Bilateral upper extremity supported;During functional activity Standing balance-Leahy Scale: Poor Standing balance comment: reliant on external support                            Cognition Arousal/Alertness: Awake/alert Behavior During Therapy: Flat affect Overall Cognitive Status: Impaired/Different from baseline Area of Impairment: Memory;Safety/judgement;Problem solving;Awareness                     Memory: Decreased short-term memory   Safety/Judgement: Decreased awareness of safety;Decreased awareness of deficits Awareness: Emergent Problem Solving: Slow processing;Decreased initiation;Difficulty sequencing;Requires verbal cues;Requires tactile cues General Comments: Pt with increased alertness, though still lethargic/flat appearing. Pt able to recall BLT precautions, requires repetition of sequencing and safety cues for effective follow-through      Exercises General Exercises - Lower Extremity Long Arc Quad: AROM;Both;Seated Hip Flexion/Marching:  AROM;Both;Seated    General Comments General comments (skin integrity, edema, etc.): low BP readings today, per RN. Supine 95/50, sitting EOB  111/58, standing 124/70. No complaints of dizziness      Pertinent Vitals/Pain Pain Assessment: Faces Faces Pain Scale: Hurts a little bit Pain Location: low back Pain Descriptors / Indicators: Grimacing;Guarding;Operative site guarding Pain Intervention(s): Limited activity within patient's tolerance;Monitored during session;Repositioned    Home Living Family/patient expects to be discharged to:: Private residence Living Arrangements: Spouse/significant other Available Help at Discharge: Family;Available 24 hours/day Type of Home: House Home Access: Stairs to enter Entrance Stairs-Rails: None Home Layout: One level;Laundry or work area in [place]: Environmental consultant - 4 wheels;Toilet riser;Lathaniel Legate - 2 wheels      Prior Function Level of Independence: Needs assistance  Gait / Transfers Assistance Needed: use of rollator, wife states needs assistance at times to get out of chair without arms ADL's / Homemaking Assistance Needed: wife has been assisting with bathing, dressing, tub transfers due to increased back pain Comments: patient has been declining recently due to pain, unable to stand upright prior to surgery.    PT Goals (current goals can now be found in the care plan section) Acute Rehab PT Goals Patient Stated Goal: to be able to walk more PT Goal Formulation: With patient/family Time For Goal Achievement: 02/24/20 Potential to Achieve Goals: Fair Progress towards PT goals: Progressing toward goals    Frequency    Min 5X/week      PT Plan Current plan remains appropriate    Co-evaluation              AM-PAC PT "6 Clicks" Mobility   Outcome Measure  Help needed turning from your back to your side while in a flat bed without using bedrails?: A Lot Help needed moving from lying on your back to sitting on the side of a flat bed without using bedrails?: A Lot Help needed moving to and from a bed to a chair (including a wheelchair)?: A Lot Help needed  standing up from a chair using your arms (e.g., wheelchair or bedside chair)?: A Lot Help needed to walk in hospital room?: Total Help needed climbing 3-5 steps with a railing? : Total 6 Click Score: 10    End of Session Equipment Utilized During Treatment: Gait belt;Back brace Activity Tolerance: Patient limited by fatigue Patient left: in chair;with call bell/phone within reach;with family/visitor present Nurse Communication: Mobility status PT Visit Diagnosis: Unsteadiness on feet (R26.81);Other abnormalities of gait and mobility (R26.89);Muscle weakness (generalized) (M62.81);Difficulty in walking, not elsewhere classified (R26.2);Pain Pain - part of body:  (low back)     Time: 2878-6767 PT Time Calculation (min) (ACUTE ONLY): 38 min  Charges:  $Therapeutic Activity: 23-37 mins                     Perrin Maltese, PT, DPT Acute Rehabilitation Services Pager [phone] Office [phone]    Chesley Noon K Allred 02/11/2020, 1:12 PM

## 2020-02-11 NOTE — Progress Notes (Signed)
02/11/20 1621  PT Visit Information  Last PT Received On 02/11/20  Assistance Needed +2  History of Present Illness 69 year old male who has complained of back and right greater left leg pain consistent with neurogenic claudication. Patient s/p L3-5 PLIF. PMH includes CAD s/p CABG, CKD, COPD, DM, HTN, s/p ICD, and CHF.   Subjective Data  Patient Stated Goal to get back to bed.   Precautions  Precautions Back  Precaution Booklet Issued Yes (comment)  Required Braces or Orthoses Spinal Brace  Spinal Brace Lumbar corset  Restrictions  Weight Bearing Restrictions No  Pain Assessment  Pain Assessment Faces  Faces Pain Scale 6  Pain Location low back  Pain Descriptors / Indicators Grimacing;Guarding;Operative site guarding  Pain Intervention(s) Limited activity within patient's tolerance;Monitored during session;Repositioned  Cognition  Arousal/Alertness Awake/alert  Behavior During Therapy Flat affect  Overall Cognitive Status Impaired/Different from baseline  Area of Impairment Safety/judgement;Problem solving;Awareness  Safety/Judgement Decreased awareness of safety;Decreased awareness of deficits  Awareness Emergent  Problem Solving Slow processing;Decreased initiation;Difficulty sequencing;Requires verbal cues;Requires tactile cues  Bed Mobility  Overal bed mobility Needs Assistance  Bed Mobility Sit to Sidelying;Rolling  Rolling Min guard  Sit to sidelying Mod assist;+2 for physical assistance  General bed mobility comments Cues for log roll technique. Assist required for LEs and trunk descent.   Transfers  Overall transfer level Needs assistance  Equipment used Rolling walker (2 wheeled)  Transfers Sit to/from [employer] Transfers  Sit to Stand Mod assist;+2 physical assistance  Stand pivot transfers +2 safety/equipment;Min assist  General transfer comment Mod A +2 to stand from lower surface. Pt with improved steadiness, only requiring min A +2 for steadying to  transfer back to bed. Required multimodal cues for sequencing.   Balance  Overall balance assessment Needs assistance  Sitting-balance support Feet supported;No upper extremity supported  Sitting balance-Leahy Scale Fair  Standing balance support Bilateral upper extremity supported;During functional activity  Standing balance-Leahy Scale Poor  Standing balance comment Reliant on UE support   PT - End of Session  Equipment Utilized During Treatment Gait belt;Back brace  Activity Tolerance Patient limited by fatigue;Patient limited by pain  Patient left with family/visitor present;in bed;with call bell/phone within reach  Nurse Communication Mobility status   PT - Assessment/Plan  PT Plan Current plan remains appropriate  PT Visit Diagnosis Unsteadiness on feet (R26.81);Other abnormalities of gait and mobility (R26.89);Muscle weakness (generalized) (M62.81);Difficulty in walking, not elsewhere classified (R26.2);Pain  Pain - part of body  (low back)  PT Frequency (ACUTE ONLY) Min 5X/week  Follow Up Recommendations CIR;Supervision/Assistance - 24 hour  PT equipment Rolling walker with 5" wheels  AM-PAC PT "6 Clicks" Mobility Outcome Measure (Version 2)  Help needed turning from your back to your side while in a flat bed without using bedrails? 3  Help needed moving from lying on your back to sitting on the side of a flat bed without using bedrails? 2  Help needed moving to and from a bed to a chair (including a wheelchair)? 3  Help needed standing up from a chair using your arms (e.g., wheelchair or bedside chair)? 2  Help needed to walk in hospital room? 2  Help needed climbing 3-5 steps with a railing?  1  6 Click Score 13  Consider Recommendation of Discharge To: CIR/SNF/LTACH  PT Goal Progression  Progress towards PT goals Progressing toward goals  Acute Rehab PT Goals  PT Goal Formulation With patient/family  Time For Goal Achievement 02/24/20  Potential to  Achieve Goals Fair   PT Time Calculation  PT Start Time (ACUTE ONLY) 1436  PT Stop Time (ACUTE ONLY) 1447  PT Time Calculation (min) (ACUTE ONLY) 11 min  PT General Charges  $$ ACUTE PT VISIT 1 Visit  PT Treatments  $Therapeutic Activity 8-22 mins   NT asking for PT to help with transfer back to bed in afternoon. Pt requiring mod A +2 to stand using RW. Noted increased stability and requiring min A +2 to transfer using RW, however, did require multimodal cues for RW sequencing. Current recommendations appropriate. Will continue to follow acutely.   Reuel Derby, PT, DPT  Acute Rehabilitation Services  Pager: [phone] Office: [phone]

## 2020-02-11 NOTE — TOC Initial Note (Signed)
Transition of Care (TOC) - Initial/Assessment Note    Patient Details  Name: James [name]. MRN: [id number] Date of Birth: 04-24-1950  Transition of Care [facility]) CM/SW Contact:    Curlene Labrum, RN Phone Number: 02/11/2020, 3:44 PM  Clinical Narrative:                 Case management met with the patient at the bedside regarding transitions of care - S/P back fusion by Dr. Arnoldo Morale.  The patient is waiting on CIR admission and bed availability.  The patient states that if he is declined for CIR he plans to discharge home with home health services and refuses SNF placement.  The patient's daughter is an Therapist, sports with [employer], Kirstie Mirza, Therapist, sports.  The patient has dme at home including a RW with seat.  The patient's currently PCP is Dr. Nona Dell but is switching practices to [facility] practice in [place], [place].  I will note this in the discharge follow up.  The patient is being followed for CIR admission and continues medical work up.  Patient with history of ICD placement for history of VT.  Expected Discharge Plan: IP Rehab Facility Barriers to Discharge: Continued Medical Work up   Patient Goals and CMS Choice Patient states their goals for this hospitalization and ongoing recovery are:: Patient hopes to admit to CIR otherwise plans to go home with home health and refuses SNF placement. CMS Medicare.gov Compare Post Acute Care list provided to:: Patient Choice offered to / list presented to : Patient  Expected Discharge Plan and Services Expected Discharge Plan: [place]     Post Acute Care Choice: IP Rehab Living arrangements for the past 2 months: Single Family Home                                      Prior Living Arrangements/Services Living arrangements for the past 2 months: Single Family Home Lives with:: Spouse Patient language and need for interpreter reviewed:: Yes Do you feel safe going back to the place where you live?: Yes       Need for Family Participation in Patient Care: Yes (Comment) Care giver support system in place?: Yes (comment) Current home services: DME (currently has Rw with seat) Criminal Activity/Legal Involvement Pertinent to Current Situation/Hospitalization: No - Comment as needed  Activities of Daily Living      Permission Sought/Granted Permission sought to share information with : Case Manager Permission granted to share information with : Yes, Verbal Permission Granted     Permission granted to share info w AGENCY: CIR, or Home health agency if declined for CIR  Permission granted to share info w Relationship: spouse - [name]     Emotional Assessment Appearance:: Appears stated age Attitude/Demeanor/Rapport: Engaged Affect (typically observed): Pleasant Orientation: : Oriented to Self, Oriented to Place, Oriented to  Time, Oriented to Situation Alcohol / Substance Use: Not Applicable Psych Involvement: No (comment)  Admission diagnosis:  Spondylolisthesis of lumbar region [M43.16] Patient Active Problem List   Diagnosis Date Noted  . Spondylolisthesis of lumbar region 02/10/2020  . Hypertension   . Heart failure ([place])   . DM (diabetes mellitus) ([place])   . Benign hypertension with chronic kidney disease, stage III ([place]) 04/11/2018  . Renal oncocytoma of right kidney 01/29/2018  . Hyperkalemia 01/17/2018  . Metabolic bone disease 50/93/2671  . Vitamin D deficiency 01/17/2018  .  Abnormal nuclear stress test 11/03/2017  . Chronic systolic congestive heart failure ([place]) 10/26/2017  . Discoloration of tongue 09/25/2017  . Obesity 10/18/2014  . COPD GOLD II 10/17/2014  . Essential hypertension 09/11/2014  . Dyspnea 09/05/2014  . CKD stage 3 due to type 1 diabetes mellitus ([place]) 08/28/2014  . Coronary artery disease involving native coronary artery of native heart with angina pectoris ([place]) 08/27/2014  . Hyperlipidemia 08/27/2014  . Hypertensive heart disease with congestive  heart failure ([place]) 08/27/2014  . ICD (implantable cardioverter-defibrillator) in place 08/27/2014  . On amiodarone therapy 08/27/2014  . Systolic CHF, chronic ([place]) 08/27/2014  . Typical atrial flutter ([place]) 08/27/2014  . Ventricular tachyarrhythmia ([place]) 08/27/2014  . CAD in native artery 08/27/2014  . Atrial arrhythmia 02/15/2014  . S/P ICD (internal cardiac defibrillator) procedure 02/15/2014  . Cardiac dysrhythmia 02/12/2014  . NSTEMI (non-ST elevated myocardial infarction) ([place]) 02/12/2014   PCP:  [name], MD Pharmacy:   [place], [place] - [address] [place] [address] Phone: [phone] Fax: [phone]  [employer] [employer] [place], [place] - [place] [address] [place] [address] Phone: [phone] Fax: [phone]     Social Determinants of Health ([place]) Interventions    Readmission Risk Interventions Readmission Risk Prevention Plan 02/11/2020  Transportation Screening Complete  PCP or Specialist Appt within 5-7 Days Complete  Home Care Screening Complete  Medication Review (RN CM) Complete  Some recent data might be hidden

## 2020-02-11 NOTE — Patient Outreach (Signed)
[facility]) Care Management  02/11/2020  James [name]. Aug 25, 1950 [id number]   Case Closure:  Patient is not active with [facility] at this time. Was admitted on 08/29/2019 for an extended admission and referred back.    PLAN: chart updated.   Tomasa Rand, RN, BSN, CEN [facility] [employer] [phone]

## 2020-02-12 ENCOUNTER — Encounter (HOSPITAL_COMMUNITY): Payer: Self-pay | Admitting: Neurosurgery

## 2020-02-12 DIAGNOSIS — I5043 Acute on chronic combined systolic (congestive) and diastolic (congestive) heart failure: Secondary | ICD-10-CM | POA: Diagnosis not present

## 2020-02-12 DIAGNOSIS — I255 Ischemic cardiomyopathy: Secondary | ICD-10-CM

## 2020-02-12 DIAGNOSIS — M4316 Spondylolisthesis, lumbar region: Secondary | ICD-10-CM

## 2020-02-12 DIAGNOSIS — Z950 Presence of cardiac pacemaker: Secondary | ICD-10-CM

## 2020-02-12 LAB — GLUCOSE, CAPILLARY
Glucose-Capillary: 114 mg/dL — ABNORMAL HIGH (ref 70–99)
Glucose-Capillary: 122 mg/dL — ABNORMAL HIGH (ref 70–99)
Glucose-Capillary: 136 mg/dL — ABNORMAL HIGH (ref 70–99)
Glucose-Capillary: 118 mg/dL — ABNORMAL HIGH (ref 70–99)

## 2020-02-12 MED ORDER — PNEUMOCOCCAL VAC POLYVALENT 25 MCG/0.5ML IJ INJ
0.5000 mL | INJECTION | INTRAMUSCULAR | Status: AC
  Administered 2020-02-13: 0.5 mL via INTRAMUSCULAR
  Filled 2020-02-12: qty 0.5

## 2020-02-12 NOTE — Progress Notes (Signed)
Contacted Dr. Cam Hai office again to request return call due to report received from night shift RN that ICD is not functioning correctly. Awaiting return call.

## 2020-02-12 NOTE — Progress Notes (Signed)
Physical Therapy Treatment Patient Details Name: James [name]. MRN: [id number] DOB: 1950-06-07 Today's Date: 02/12/2020    History of Present Illness 69 year old male who has complained of back and right greater left leg pain consistent with neurogenic claudication. Patient s/p L3-5 PLIF. PMH includes CAD s/p CABG, CKD, COPD, DM, HTN, s/p ICD, and CHF.     PT Comments    Patient progressing towards physical therapy goals. Session limited by fatigue and pain. Patient required maxA+2 for bed mobility and modA+2 for sit to stand transfer with RW, cues for hand placement and sequencing. Patient performed 2 steps fwd and 2 sidesteps towards [facility] with RW and minA, assist for management of RW. Patient unable to complete stand pivot transfer due to fatigue this session. Patient continues to be limited by pain, decreased activity tolerance, impaired balance, generalized weakness, and impaired functional mobility. Continue to recommend comprehensive inpatient rehab (CIR) for post-acute therapy needs.    Follow Up Recommendations  CIR;Supervision/Assistance - 24 hour     Equipment Recommendations  Rolling Mikael Debell with 5" wheels    Recommendations for Other Services Rehab consult     Precautions / Restrictions Precautions Precautions: Back Precaution Booklet Issued: Yes (comment) Required Braces or Orthoses: Spinal Brace Spinal Brace: Lumbar corset Restrictions Weight Bearing Restrictions: No    Mobility  Bed Mobility Overal bed mobility: Needs Assistance Bed Mobility: Supine to Sit     Supine to sit: Max assist;+2 for physical assistance;+2 for safety/equipment;HOB elevated Sit to supine: Max assist   General bed mobility comments: cues for log roll technique. Assist required for LEs and trunk descent  Transfers Overall transfer level: Needs assistance Equipment used: Rolling Jenner Rosier (2 wheeled) Transfers: Sit to/from Stand Sit to Stand: Mod assist;+2 physical assistance             Ambulation/Gait Ambulation/Gait assistance: Min assist Gait Distance (Feet): 2 Feet Assistive device: Rolling Karlen Barbar (2 wheeled) Gait Pattern/deviations: Step-to pattern     General Gait Details: 2 small steps fwd, 2-3 sidesteps towards HOB. Assist required for RW management   Stairs             Wheelchair Mobility    Modified Rankin (Stroke Patients Only)       Balance Overall balance assessment: Needs assistance Sitting-balance support: Feet supported;No upper extremity supported Sitting balance-Leahy Scale: Fair     Standing balance support: Bilateral upper extremity supported;During functional activity Standing balance-Leahy Scale: Poor Standing balance comment: reliant on UE support                            Cognition Arousal/Alertness: Awake/alert Behavior During Therapy: Flat affect Overall Cognitive Status: Impaired/Different from baseline Area of Impairment: Safety/judgement;Problem solving;Awareness                         Safety/Judgement: Decreased awareness of safety;Decreased awareness of deficits Awareness: Emergent Problem Solving: Slow processing;Decreased initiation;Difficulty sequencing;Requires verbal cues;Requires tactile cues General Comments: Pt able to recall 2/3 BLT precautions with cues patient able to name lifting.  Cues required throughout mobility for sequencing and safety       Exercises General Exercises - Lower Extremity Straight Leg Raises: AROM;Both;5 reps    General Comments        Pertinent Vitals/Pain Pain Assessment: Faces Faces Pain Scale: Hurts even more Pain Location: low back Pain Descriptors / Indicators: Grimacing;Guarding;Operative site guarding Pain Intervention(s): Limited activity within patient's tolerance;Monitored during session;Repositioned  Home Living                      Prior Function            PT Goals (current goals can now be found in the care  plan section) Acute Rehab PT Goals Patient Stated Goal: to get stronger PT Goal Formulation: With patient/family Time For Goal Achievement: 02/24/20 Potential to Achieve Goals: Fair Progress towards PT goals: Progressing toward goals    Frequency    Min 5X/week      PT Plan Current plan remains appropriate    Co-evaluation              AM-PAC PT "6 Clicks" Mobility   Outcome Measure  Help needed turning from your back to your side while in a flat bed without using bedrails?: A Little Help needed moving from lying on your back to sitting on the side of a flat bed without using bedrails?: A Lot Help needed moving to and from a bed to a chair (including a wheelchair)?: A Little Help needed standing up from a chair using your arms (e.g., wheelchair or bedside chair)?: A Lot Help needed to walk in hospital room?: A Lot Help needed climbing 3-5 steps with a railing? : Total 6 Click Score: 13    End of Session Equipment Utilized During Treatment: Gait belt;Back brace Activity Tolerance: Patient limited by fatigue;Patient limited by pain Patient left: in bed;with call bell/phone within reach;with bed alarm set;with family/visitor present Nurse Communication: Mobility status PT Visit Diagnosis: Unsteadiness on feet (R26.81);Other abnormalities of gait and mobility (R26.89);Muscle weakness (generalized) (M62.81);Difficulty in walking, not elsewhere classified (R26.2);Pain Pain - part of body:  (low back)     Time: 5102-5852 PT Time Calculation (min) (ACUTE ONLY): 25 min  Charges:  $Therapeutic Activity: 23-37 mins                     James [name], PT, DPT Acute Rehabilitation Services Pager [phone] Office [phone]    Chesley Noon K Allred 02/12/2020, 10:12 AM

## 2020-02-12 NOTE — Progress Notes (Signed)
Subjective: The patient is alert and pleasant.  His back is appropriately sore.  He is in no apparent distress.  He has been slow to mobilize.  Objective: Vital signs in last 24 hours: Temp:  [98.5 F (36.9 C)-100.1 F (37.8 C)] 100.1 F (37.8 C) (11/10 1506) Pulse Rate:  [63-71] 63 (11/10 1506) Resp:  [17-18] 18 (11/10 1506) BP: (117-128)/(57-62) 117/57 (11/10 1506) SpO2:  [91 %-95 %] 91 % (11/10 1506) Estimated body mass index is 34.39 kg/m as calculated from the following:   Height as of 02/06/20: 5\' 8"  (1.727 m).   Weight as of 02/06/20: 102.6 kg.   Intake/Output from previous day: 11/09 0701 - 11/10 0700 In: 1040 [P.O.:1040] Out: 500 [Urine:500] Intake/Output this shift: Total I/O In: 480 [P.O.:480] Out: -   Physical exam the patient is alert and wanted.  He is moving his lower extremities well.  Lab Results: Recent Labs    02/11/20 0135  WBC 11.1*  HGB 11.7*  HCT 36.0*  PLT 192   BMET Recent Labs    02/11/20 0135  NA 140  K 3.9  CL 104  CO2 25  GLUCOSE 162*  BUN 32*  CREATININE 1.39*  CALCIUM 8.9    Studies/Results: No results found.  Assessment/Plan: Postop day #2.  We will continue mobilizing with PT and OT.  We are awaiting rehab placement.  Low-grade fevers: We will observe.  I will repeat his CBC tomorrow.  Renal insufficiency: We will repeat his BMP tomorrow.  Cardiac issues: I appreciate Dr. Evette Georges input.  LOS: 2 days     Ophelia Charter 02/12/2020, 4:49 PM

## 2020-02-12 NOTE — Plan of Care (Signed)
  Problem: Education: Goal: Ability to verbalize activity precautions or restrictions will improve Outcome: Progressing Goal: Knowledge of the prescribed therapeutic regimen will improve Outcome: Progressing

## 2020-02-12 NOTE — Consult Note (Addendum)
Cardiology Consultation:   Patient ID: James [name]. MRN: [id number]; DOB: March 22, 1951  Admit date: 02/10/2020 Date of Consult: 02/12/2020  Primary Care Provider: [name], MD [facility] Cardiologist: Shirlee More, MD  [facility] Electrophysiologist:  [facility] EP Dr. Dennie Fetters   Patient Profile:   James [name]. is a 69 y.o. male with a hx of CAD s/p CABG 2008, ICM/chronic systolic CHF, [employer]tr[employer] ICD (followed by [facility]), ventricular tachycardia on amiodarone, atrial flutter, moderate-severe COPD, chronic incomplete RBBB, DM, HTN, HLD, CKD stage III  who is being seen today for the evaluation of general cardiac management at the request of Dr. Arnoldo Morale.  History of Present Illness:   His general cardiology problems are followed by Dr. Bettina Gavia. Per his note, his last heart catheterization was [facility] 11/21/2017 abbreviated report listed patent LIMA-LAD and SVG-OM with 100% OM after insertion, historical occlusion of the RCA and SVG-RCA. Last echo was 04/2019 showing EF 40% with limited views, grade 2 DD, mildly dilated LV, global LV hypokinesis, mildly reduced RV function, moderate LAE, mild MR. Dr. Bettina Gavia had recommended cMRI to define volume of heart/EF and extent of scar but appears patient cancelled this. (Dr. Bettina Gavia had discussed with his EP team that ICD was MRI-compatible). Dr. Bettina Gavia cleared him for surgery at [facility] 01/13/20. He was cleared to stop Plavix 7 days prior to surgery. His carvedilol was changed to low dose Toprol and Entresto dose reduced due to hypotension. He was admitted for planned lumbar surgery due to neurogenic claudication which he appears to have tolerated well. He states his neurologic issues are gone but still having some soreness at the incision site. He denies any CP, palpitations, dizziness, shortness of breath or orthopnea. His metoprolol, AM Entresto, AM amiodarone, Imdur, and Flomax were held  yesterday AM since blood pressure was soft in the 80s/40s. He appears to have gotten back on track with his medications with stable BP since that time. Last BP just a short while ago was 117/57 although vitals show low grade fever of 100.1 by last check. Labs show stable renal insufficiency (Cr 1.39), post-op anemia with Hgb 11.7 and mildly elevated WBC 11.1. Cardiology is consulted for med and telemetry review.   Past Medical History:  Diagnosis Date  . Abnormal nuclear stress test 11/03/2017   Added automatically from request for surgery [phone]  . Benign hypertension with chronic kidney disease, stage III ([place]) 04/11/2018  . CAD in native artery 08/27/2014  . Chronic systolic congestive heart failure ([place]) 10/26/2017  . CKD stage 3 due to type 1 diabetes mellitus ([place]) 08/28/2014   Overview:  Cr 1.3 at discharge 08/21/14  . COPD GOLD II 10/17/2014   Followed in Pulmonary clinic/ [facility]/ [place]  - PFTs   10/17/2014 FEV1  2.09 ( 53%) ratio 62 no sign better p B2 and dlco 63% and corrects to 85%  - 10/17/2014 p extensive coaching HFA effectiveness =  90%    > try stiolto    . DM (diabetes mellitus) ([place])   . Dyspnea 09/05/2014   Followed in Pulmonary clinic/  [facility] [place] - 09/05/2014  Walked RA x 3 laps @ 185 ft each stopped due to end of study, nl pace, no desat   - pfts 10/17/14 dlco 63% on Amiodarone > may benefit from 6 month f/u but defer to Cards    . Essential hypertension 09/11/2014   Changed acei to ARB  09/05/14 due  to pseudowheeze> improved 10/17/14    . Hyperlipidemia   . Hypertension   . Hypertensive heart disease with congestive heart failure ([place]) 08/27/2014  . ICD (implantable cardioverter-defibrillator) in place 08/27/2014  . Metabolic bone disease 67/34/1937  . NSTEMI (non-ST elevated myocardial infarction) ([place]) 02/12/2014  . Obesity 12/05/4095   Complicated by dm/ hbp and low erv on pfts 10/17/14    . On amiodarone therapy 08/27/2014  . Renal oncocytoma of right kidney  01/29/2018  . S/P ICD (internal cardiac defibrillator) procedure 02/15/2014   followed by [facility] EP  . Typical atrial flutter ([place]) 08/27/2014  . Ventricular tachyarrhythmia ([place]) 08/27/2014  . Vitamin D deficiency 01/17/2018    Past Surgical History:  Procedure Laterality Date  . CORONARY ARTERY BYPASS GRAFT  2008  . EYE SURGERY    . ICD IMPLANT    . MOHS SURGERY     Right Arm  . NEPHRECTOMY Right      Home Medications:  Prior to Admission medications   Medication Sig Start Date End Date Taking? Authorizing Provider  acetaminophen (TYLENOL) 500 MG tablet Take 500 mg by mouth 2 (two) times daily.    Yes [provider]  amiodarone (PACERONE) 200 MG tablet Take 200 mg by mouth 2 (two) times daily.   Yes [provider]  Ascorbic Acid (VITAMIN C) 1000 MG tablet Take 1,000 mg by mouth 2 (two) times daily.   Yes [provider]  atorvastatin (LIPITOR) 40 MG tablet TAKE 1 TABLET BY MOUTH ONCE DAILY. *NEEDS APPOINTMENT FOR REFILLS* Patient taking differently: Take 40 mg by mouth daily.  06/28/19  Yes Richardo Priest, MD  b complex vitamins capsule Take 1 capsule by mouth daily.   Yes [provider]  clopidogrel (PLAVIX) 75 MG tablet Take 1 tablet (75 mg total) by mouth daily. 03/05/19  Yes Richardo Priest, MD  Coenzyme Q10 (COQ10) 100 MG CAPS Take 100 mg by mouth daily.   Yes [provider]  ergocalciferol (VITAMIN D2) 1.25 MG (50000 UT) capsule Take 50,000 Units by mouth every Monday.  03/08/19  Yes [provider]  furosemide (LASIX) 40 MG tablet TAKE 1 TABLET BY MOUTH TWICE(2) DAILY Patient taking differently: Take 40 mg by mouth 2 (two) times daily.  03/05/19  Yes Richardo Priest, MD  insulin detemir (LEVEMIR) 100 UNIT/ML injection Inject 10 Units into the skin daily.    Yes [provider]  isosorbide mononitrate (IMDUR) 30 MG 24 hr tablet Take 1 tablet by mouth every morning. (Hold if blood pressure under 100 sbp)   Yes  [provider]  levothyroxine (SYNTHROID, LEVOTHROID) 88 MCG tablet Take 88 mcg by mouth daily before breakfast.  12/22/15  Yes [provider]  LORazepam (ATIVAN) 0.5 MG tablet Take 0.5 mg by mouth See admin instructions. Take 0.5 mg at bedtime may take a second 0.5 mg during the day as needed for anxiety 09/13/17  Yes [provider]  metoprolol succinate (TOPROL XL) 25 MG 24 hr tablet Take 1 tablet (25 mg total) by mouth daily. 01/13/20  Yes Richardo Priest, MD  Multiple Vitamin (MULTIVITAMIN) capsule Take 1 capsule by mouth daily.   Yes [provider]  [facility] powder Apply 1 application topically daily as needed for irritation. 09/13/19  Yes [provider]  omega-3 acid ethyl esters (LOVAZA) 1 g capsule Take 2 capsules (2 g total) by mouth in the morning and at bedtime. Patient taking differently: Take 1 g by mouth  in the morning and at bedtime.  11/26/19  Yes Richardo Priest, MD  sacubitril-valsartan (ENTRESTO) 49-51 MG Take 1 tablet by mouth 2 (two) times daily. 01/13/20  Yes Richardo Priest, MD  senna (SENOKOT) 8.6 MG tablet Take 2 tablets by mouth daily.    Yes [provider]  sitaGLIPtin (JANUVIA) 25 MG tablet Take 25 mg by mouth daily.    Yes [provider]  tamsulosin (FLOMAX) 0.4 MG CAPS capsule Take 0.4 mg by mouth daily.  08/27/19  Yes [provider]  Zinc 50 MG TABS Take 50 mg by mouth daily.    Yes [provider]  magnesium hydroxide (MILK OF MAGNESIA) 400 MG/5ML suspension Take 15 mLs by mouth daily as needed for mild constipation.    [provider]  melatonin 3 MG TABS tablet Take 6 mg by mouth at bedtime.     [provider]  nitroGLYCERIN (NITROSTAT) 0.4 MG SL tablet Place 1 tablet (0.4 mg total) under the tongue every 5 (five) minutes as needed for chest pain. 03/05/19   Richardo Priest, MD    Inpatient Medications: Scheduled Meds: . amiodarone  200 mg Oral BID  . atorvastatin   40 mg Oral Daily  . docusate sodium  100 mg Oral BID  . furosemide  40 mg Oral BID  . insulin aspart  0-15 Units Subcutaneous TID WC  . insulin aspart  0-5 Units Subcutaneous QHS  . isosorbide mononitrate  30 mg Oral q morning - 10a  . levothyroxine  88 mcg Oral QAC breakfast  . linagliptin  5 mg Oral Daily  . metoprolol succinate  25 mg Oral Daily  . sacubitril-valsartan  1 tablet Oral BID  . sodium chloride flush  3 mL Intravenous Q12H  . tamsulosin  0.4 mg Oral Daily   Continuous Infusions: . sodium chloride 250 mL (02/10/20 1645)   PRN Meds: acetaminophen **OR** acetaminophen, bisacodyl, cyclobenzaprine, HYDROmorphone, menthol-cetylpyridinium **OR** phenol, morphine injection, nitroGLYCERIN, ondansetron **OR** ondansetron (ZOFRAN) IV, sodium chloride flush  Allergies:    Allergies  Allergen Reactions  . Baclofen Other (See Comments)    Mental status changes  . Codeine Rash    Social History:   Social History   Socioeconomic History  . Marital status: Married    Spouse name: Not on file  . Number of children: 4  . Years of education: 46  . Highest education level: Some college, no degree  Occupational History  . Occupation: Sales   Tobacco Use  . Smoking status: Former Smoker    Packs/day: 1.00    Years: 38.00    Pack years: 38.00    Types: Cigarettes    Quit date: 04/04/2006    Years since quitting: 13.8  . Smokeless tobacco: Never Used  Vaping Use  . Vaping Use: Never used  Substance and Sexual Activity  . Alcohol use: No    Alcohol/week: 0.0 standard drinks    Comment: Rarely  . Drug use: No  . Sexual activity: Not on file  Other Topics Concern  . 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    Social Determinants of Health   Financial Resource Strain:   . Difficulty of Paying Living Expenses: Not on file  Food Insecurity:     . Worried About Charity fundraiser in the Last Year:  Not on file  . Ran Out of Food in the Last Year: Not on file  Transportation Needs:   . Lack of Transportation (Medical): Not on file  . Lack of Transportation (Non-Medical): Not on file  Physical Activity:   . Days of Exercise per Week: Not on file  . Minutes of Exercise per Session: Not on file  Stress:   . Feeling of Stress : Not on file  Social Connections:   . Frequency of Communication with Friends and Family: Not on file  . Frequency of Social Gatherings with Friends and Family: Not on file  . Attends Religious Services: Not on file  . Active Member of Clubs or Organizations: Not on file  . Attends Archivist Meetings: Not on file  . Marital Status: Not on file  Intimate Partner Violence:   . Fear of Current or Ex-Partner: Not on file  . Emotionally Abused: Not on file  . Physically Abused: Not on file  . Sexually Abused: Not on file    Family History:   Family History  Problem Relation Age of Onset  . Heart disease Mother   . Breast cancer Mother   . Heart disease Father   . Cancer Daughter   . COPD Brother      ROS:  Please see the history of present illness.  All other ROS reviewed and negative.     Physical Exam/Data:   Vitals:   02/11/20 2008 02/12/20 0300 02/12/20 0724 02/12/20 1506  BP: (!) 125/58 128/62 125/61 (!) 117/57  Pulse: 68 66 71 63  Resp: 17 18 17 18   Temp: 98.5 F (36.9 C) 98.7 F (37.1 C) 99.6 F (37.6 C) 100.1 F (37.8 C)  TempSrc: Oral Oral Oral Oral  SpO2: 94% 95% 93% 91%    Intake/Output Summary (Last 24 hours) at 02/12/2020 1512 Last data filed at 02/12/2020 0900 Gross per 24 hour  Intake 800 ml  Output 400 ml  Net 400 ml   Last 3 Weights 02/06/2020 01/13/2020 08/28/2019  Weight (lbs) 226 lb 3.2 oz 230 lb 226 lb 6.4 oz  Weight (kg) 102.604 kg 104.327 kg 102.694 kg       General:  Well nourished, well developed obese WM in no acute distress Head:  Normocephalic, atraumatic, sclera non-icteric, no xanthomas, nares are without discharge. Neck: Negative for carotid bruits. JVP not elevated. Lungs: Clear bilaterally to auscultation without wheezes, rales, or rhonchi. Breathing is unlabored. Heart: RRR S1 S2 without murmurs, rubs, or gallops.  Abdomen: Soft, non-tender, non-distended with normoactive bowel sounds. No rebound/guarding. Extremities: No clubbing or cyanosis. Trace edema with SCDs. Distal pedal pulses are 2+ and equal bilaterally. Neuro: Alert and oriented X 3. Moves all extremities spontaneously. Psych:  Responds to questions appropriately with a normal affect.  EKG:  The EKG was personally reviewed and demonstrates:  NSR 61bpm, intermittent atrial pacing, NSIVCD with QRS 134ms (hx IRBBB), nonspecific TW changes, no acute change compared to prior, prior inferior infarct Telemetry:  Telemetry was personally reviewed and demonstrates:  NSR also intermittent atrial pacing  Relevant CV Studies: 2D echo 04/29/19 1. Left ventricular ejection fraction, by visual estimation, is 40% from  limited para sternal views without definity. Left ventricular septal wall  thickness was mildly increased. Normal left ventricular posterior wall  thickness. There is no left  ventricular hypertrophy.  2. Left ventricular diastolic parameters are consistent with Grade II  diastolic dysfunction (pseudonormalization).  3. Mildly dilated left ventricular internal cavity size.  4.  The left ventricle demonstrates global hypokinesis.  5. Global right ventricle has mildly reduced systolic function.The right  ventricular size is normal. No increase in right ventricular wall  thickness.  6. Left atrial size was moderately dilated.  7. Right atrial size was normal.  8. The mitral valve is normal in structure. Mild mitral valve  regurgitation. No evidence of mitral stenosis.  9. The tricuspid valve is normal in structure.  10. The tricuspid valve  is normal in structure. Tricuspid valve  regurgitation is not demonstrated.  11. The aortic valve is tricuspid. Aortic valve regurgitation is not  visualized. Mild aortic valve sclerosis without stenosis.  12. The pulmonic valve was not well visualized. Pulmonic valve  regurgitation is not visualized.  13. The inferior vena cava is normal in size with <50% respiratory  variability, suggesting right atrial pressure of 8 mmHg.  14. The aortic root was not well visualized and the ascending aorta was  not well visualized.    Laboratory Data:  High Sensitivity Troponin:  No results for input(s): TROPONINIHS in the last 720 hours.   Chemistry Recent Labs  Lab 02/06/20 1044 02/11/20 0135  NA 139 140  K 4.0 3.9  CL 99 104  CO2 29 25  GLUCOSE 122* 162*  BUN 28* 32*  CREATININE 1.50* 1.39*  CALCIUM 9.8 8.9  GFRNONAA 50* 55*  ANIONGAP 11 11    No results for input(s): PROT, ALBUMIN, AST, ALT, ALKPHOS, BILITOT in the last 168 hours. Hematology Recent Labs  Lab 02/06/20 1044 02/11/20 0135  WBC 8.0 11.1*  RBC 4.50 3.85*  HGB 13.8 11.7*  HCT 42.1 36.0*  MCV 93.6 93.5  MCH 30.7 30.4  MCHC 32.8 32.5  RDW 14.1 14.3  PLT 222 192   BNPNo results for input(s): BNP, PROBNP in the last 168 hours.  DDimer No results for input(s): DDIMER in the last 168 hours.   Radiology/Studies:  DG Lumbar Spine 2-3 Views  Result Date: 02/10/2020 CLINICAL DATA:  Posterior fusion EXAM: LUMBAR SPINE - 2-3 VIEW; DG C-ARM 1-60 MIN COMPARISON:  January 07, 2020 FLUOROSCOPY TIME:  0 minutes 22 seconds; 19.85 mGy; 2 acquired images FINDINGS: Frontal and lateral views were obtained. There are pedicle screws at L3, L4, and L5 bilaterally with screw tips in respective vertebral bodies. Disc spacers noted at L3-4 and L4-5. No fracture evident. Mild spondylolisthesis at L4-5 appear stable. No new spondylolisthesis. IMPRESSION: Postoperative pedicle screw placement at L3, L4, and L5 as well as disc spacers at L3-4  and L4-5. Pedicle screws in respective vertebral bodies. Stable mild spondylolisthesis at L4-5 with screw placement in this area. No fracture evident. Electronically Signed   By: Lowella Grip III M.D.   On: 02/10/2020 12:33   DG Lumbar Spine 1 View  Result Date: 02/10/2020 CLINICAL DATA:  Intraoperative localization image EXAM: LUMBAR SPINE - 1 VIEW COMPARISON:  01/07/2020, 12/13/2019 FINDINGS: Lateral radiograph of the lumbar spine was obtained with surgical retractors and instruments at the L3-4 interspace. Numbering nomenclature is similar to that utilized on prior MRI examination. IMPRESSION: Intraoperative localization at L3-4. Electronically Signed   By: Inez Catalina M.D.   On: 02/10/2020 14:10   DG C-Arm 1-60 Min  Result Date: 02/10/2020 CLINICAL DATA:  Posterior fusion EXAM: LUMBAR SPINE - 2-3 VIEW; DG C-ARM 1-60 MIN COMPARISON:  January 07, 2020 FLUOROSCOPY TIME:  0 minutes 22 seconds; 19.85 mGy; 2 acquired images FINDINGS: Frontal and lateral views were obtained. There are pedicle screws at L3, L4, and  L5 bilaterally with screw tips in respective vertebral bodies. Disc spacers noted at L3-4 and L4-5. No fracture evident. Mild spondylolisthesis at L4-5 appear stable. No new spondylolisthesis. IMPRESSION: Postoperative pedicle screw placement at L3, L4, and L5 as well as disc spacers at L3-4 and L4-5. Pedicle screws in respective vertebral bodies. Stable mild spondylolisthesis at L4-5 with screw placement in this area. No fracture evident. Electronically Signed   By: Lowella Grip III M.D.   On: 02/10/2020 12:33     Assessment and Plan:   1. Neurogenic claudication s/p lumbar surgery 02/10/20 with bilateral L3-4 and L4-5 laminotomy/foraminotomies/medial facetectomy  - notes outline progression as expected with plan to dc to CIR - low grade temp noted on last VS, will defer management to primary team, patient feels well -> asked nurse to notify primary team  2. Ischemic  cardiomyopathy/chronic systolic CHF - had post-op hypotension yesterday requiring holding of AM meds - has since gotten back on track and BP stable today - continue current regimen without change and follow VS - will review if any further recs with MD  3. History of ICD, ventricular tachycardia - telemetry and EKG show NSR with intermittent atrial pacing - patient states he believes they came to turn his ICD off for surgery but did not turn it back on. With cardmaster's help, [employer] rep Gae Dry did clarify he turned device back on while patient was in PACU - further monitoring of organ systems while on amiodarone will be at discretion of primary cardiologist. He has been maintained on 200mg  BID dosing and consideration can be given to lowering to once daily as outpatient if appropriate (given VT in 04/2019 per EP notes would defer to the outpatient setting to make this decision)  4. Incomplete RBBB - chronic, no change  5. CAD s/p prior CABG - per Dr. Joya Gaskins OV in October had been cleared to hold Plavix pre-operatively - we typically advise that blood thinners be resumed when felt safe by surgical team  6. CKD stage III - previous baseline 1.47 in 01/2020, stable this admission - f/u value in AM  7. Post-op anemia - will order f/u Hgb in AM  8. History of atrial flutter - details of dx not totally clear. [facility] chart also includes PAF as well. He was not on oral anticoagulation as outpatient in context of maintaining NSR as outpatient. Rhythm trends should continue to be followed as OP on device. - maintaining NSR with intermittent atrial pacing on telemetry on amiodarone   New York Heart Association (NYHA) Functional Class NYHA Class II, activity also limited by neurogenic claudication     For questions or updates, please contact [facility] Please consult [email or web address] for contact info under    Signed, James Pitter, PA-C  02/12/2020 3:12 PM    Patient seen  and examined. Agree with assessment and plan.  James [name], James Bonito. is a 68 year old gentleman who has known CAD and underwent CABG revascularization surgery in 2008 with a LIMA to LAD, vein graft to his marginal vessel and vein graft to his RCA.  He has a history of chronic systolic heart failure and underwent ICD implantation and is followed by Dr. Ola Spurr at [facility] for his EP care.  He has been followed by Dr. Bettina Gavia for his cardiac history and was cleared to stop Plavix for 7 days prior to surgery.  He has a remote history of VT on amiodarone, atrial flutter, and has a history of  COPD, diabetes mellitus hypertension hyperlipidemia and stage III chronic kidney disease.  Preoperatively he was on maximum dose Entresto in addition to carvedilol for his CHF and continue to be on amiodarone at 200 mg twice daily.  He has hypothyroidism on levothyroxine.  He developed low blood pressure yesterday and his Entresto, metoprolol, amiodarone, Imdur and Flomax were held./Presently, blood pressure has stabilized and is approximately 120/60.  His ECG shows sinus rhythm with intermittent appropriate atrial pacing and atrial sensing.  He has inferior Q waves and normal QTc interval.  There is evidence for mild RV conduction delay.  Presently on exam he appears euvolemic.  There is no JVD.  There were no rales or wheezes on lung exam.  Rhythm was regular there was a faint 1/6 systolic murmur.  There was no S3 gallop.  Abdomen was soft and nontender.  Distal pulses were equal.  There was no significant edema.  His last echo Doppler study in January 2021 showed an EF at 40% with grade 2 diastolic dysfunction.  He had moderate left atrial dilatation and mild MR.  There was mild aortic sclerosis without stenosis.  Presently, he appears stable.  Recommend reinstitution of Entresto at the lower dose now at 49/51 mg.  His Medtronic ICD device was turned back on in PACU.  Now that oral intake improved, resume  furosemide.  However he may only need 40 mg daily rather than twice daily dosing depending upon blood pressure and laboratory.  Continue with metoprolol succinate 25 mg daily.  He is not having any anginal symptoms on his low-dose isosorbide 30 mg daily.  Resume Plavix when appropriate from a postoperative standpoint.   James Sine, MD, [facility] 02/12/2020 4:41 PM

## 2020-02-12 NOTE — Progress Notes (Signed)
Inpatient Rehabilitation-Admissions Coordinator   Per RN, pt awaiting cardiology consult. Will continue to follow for medical readiness.   Raechel Ache, OTR/L  Rehab Admissions Coordinator  [phone] 02/12/2020 1:44 PM

## 2020-02-12 NOTE — Progress Notes (Signed)
Return call received from Neurosurgery PA.Order received for Cardiology consult and EKG.

## 2020-02-12 NOTE — Progress Notes (Signed)
Contacted Dr. Cam Hai office to request return call due to report hat ICD is not functioning correctly from night shift RN.  Awaiting return call.

## 2020-02-12 NOTE — Progress Notes (Signed)
Talked to [employer] vendor rep. Ronalee Belts.  Ronalee Belts states that ICD is functioning correctly and that patient is safe for discharge.

## 2020-02-12 NOTE — Anesthesia Postprocedure Evaluation (Signed)
Anesthesia Post Note  Patient: James [name].  Procedure(s) Performed: POSTERIOR LUMBAR FUSION, INTERBODY PROSTHESIS, POSTERIOR INSTRUMENTATION LUMBAR THREE- LUMBAR FOUR, LUMBAR FOUR- LUMBAR FIVE (N/A Spine Lumbar)     Patient location during evaluation: PACU Anesthesia Type: General Level of consciousness: awake and alert Pain management: pain level controlled Vital Signs Assessment: post-procedure vital signs reviewed and stable Respiratory status: spontaneous breathing, nonlabored ventilation, respiratory function stable and patient connected to nasal cannula oxygen Cardiovascular status: blood pressure returned to baseline and stable Postop Assessment: no apparent nausea or vomiting Anesthetic complications: no   No complications documented.  Last Vitals:  Vitals:   02/12/20 0300 02/12/20 0724  BP: 128/62 125/61  Pulse: 66 71  Resp: 18 17  Temp: 37.1 C 37.6 C  SpO2: 95% 93%    Last Pain:  Vitals:   02/12/20 0724  TempSrc: Oral  PainSc:                  Effie Berkshire

## 2020-02-13 ENCOUNTER — Encounter (HOSPITAL_COMMUNITY): Payer: Self-pay | Admitting: Physical Medicine and Rehabilitation

## 2020-02-13 ENCOUNTER — Other Ambulatory Visit: Payer: Self-pay

## 2020-02-13 ENCOUNTER — Inpatient Hospital Stay (HOSPITAL_COMMUNITY)
Admission: RE | Admit: 2020-02-13 | Discharge: 2020-03-04 | DRG: 560 | Disposition: A | Discharge status: Home Health | Payer: Medicare Other | Source: Intra-hospital | Attending: Physical Medicine and Rehabilitation | Admitting: Physical Medicine and Rehabilitation

## 2020-02-13 DIAGNOSIS — M62830 Muscle spasm of back: Secondary | ICD-10-CM | POA: Diagnosis present

## 2020-02-13 DIAGNOSIS — I130 Hypertensive heart and chronic kidney disease with heart failure and stage 1 through stage 4 chronic kidney disease, or unspecified chronic kidney disease: Secondary | ICD-10-CM | POA: Diagnosis present

## 2020-02-13 DIAGNOSIS — Z7902 Long term (current) use of antithrombotics/antiplatelets: Secondary | ICD-10-CM | POA: Diagnosis not present

## 2020-02-13 DIAGNOSIS — G4700 Insomnia, unspecified: Secondary | ICD-10-CM | POA: Diagnosis present

## 2020-02-13 DIAGNOSIS — E1065 Type 1 diabetes mellitus with hyperglycemia: Secondary | ICD-10-CM | POA: Diagnosis present

## 2020-02-13 DIAGNOSIS — Z79899 Other long term (current) drug therapy: Secondary | ICD-10-CM

## 2020-02-13 DIAGNOSIS — E1165 Type 2 diabetes mellitus with hyperglycemia: Secondary | ICD-10-CM | POA: Diagnosis not present

## 2020-02-13 DIAGNOSIS — E785 Hyperlipidemia, unspecified: Secondary | ICD-10-CM | POA: Diagnosis present

## 2020-02-13 DIAGNOSIS — Z951 Presence of aortocoronary bypass graft: Secondary | ICD-10-CM

## 2020-02-13 DIAGNOSIS — M48061 Spinal stenosis, lumbar region without neurogenic claudication: Secondary | ICD-10-CM | POA: Diagnosis not present

## 2020-02-13 DIAGNOSIS — I5032 Chronic diastolic (congestive) heart failure: Secondary | ICD-10-CM

## 2020-02-13 DIAGNOSIS — Z905 Acquired absence of kidney: Secondary | ICD-10-CM

## 2020-02-13 DIAGNOSIS — K5903 Drug induced constipation: Secondary | ICD-10-CM | POA: Diagnosis not present

## 2020-02-13 DIAGNOSIS — Z4789 Encounter for other orthopedic aftercare: Principal | ICD-10-CM

## 2020-02-13 DIAGNOSIS — M5416 Radiculopathy, lumbar region: Secondary | ICD-10-CM | POA: Diagnosis not present

## 2020-02-13 DIAGNOSIS — N400 Enlarged prostate without lower urinary tract symptoms: Secondary | ICD-10-CM | POA: Diagnosis present

## 2020-02-13 DIAGNOSIS — Z23 Encounter for immunization: Secondary | ICD-10-CM

## 2020-02-13 DIAGNOSIS — I252 Old myocardial infarction: Secondary | ICD-10-CM

## 2020-02-13 DIAGNOSIS — R0989 Other specified symptoms and signs involving the circulatory and respiratory systems: Secondary | ICD-10-CM

## 2020-02-13 DIAGNOSIS — N390 Urinary tract infection, site not specified: Secondary | ICD-10-CM | POA: Diagnosis present

## 2020-02-13 DIAGNOSIS — N183 Chronic kidney disease, stage 3 unspecified: Secondary | ICD-10-CM | POA: Diagnosis present

## 2020-02-13 DIAGNOSIS — Z794 Long term (current) use of insulin: Secondary | ICD-10-CM

## 2020-02-13 DIAGNOSIS — I2510 Atherosclerotic heart disease of native coronary artery without angina pectoris: Secondary | ICD-10-CM | POA: Diagnosis present

## 2020-02-13 DIAGNOSIS — K5900 Constipation, unspecified: Secondary | ICD-10-CM | POA: Diagnosis present

## 2020-02-13 DIAGNOSIS — M7989 Other specified soft tissue disorders: Secondary | ICD-10-CM | POA: Diagnosis not present

## 2020-02-13 DIAGNOSIS — I5022 Chronic systolic (congestive) heart failure: Secondary | ICD-10-CM | POA: Diagnosis present

## 2020-02-13 DIAGNOSIS — Z95810 Presence of automatic (implantable) cardiac defibrillator: Secondary | ICD-10-CM

## 2020-02-13 DIAGNOSIS — E039 Hypothyroidism, unspecified: Secondary | ICD-10-CM | POA: Diagnosis present

## 2020-02-13 DIAGNOSIS — G8918 Other acute postprocedural pain: Secondary | ICD-10-CM | POA: Diagnosis not present

## 2020-02-13 DIAGNOSIS — M25552 Pain in left hip: Secondary | ICD-10-CM

## 2020-02-13 DIAGNOSIS — D62 Acute posthemorrhagic anemia: Secondary | ICD-10-CM | POA: Diagnosis present

## 2020-02-13 DIAGNOSIS — N1832 Chronic kidney disease, stage 3b: Secondary | ICD-10-CM | POA: Diagnosis present

## 2020-02-13 DIAGNOSIS — J449 Chronic obstructive pulmonary disease, unspecified: Secondary | ICD-10-CM | POA: Diagnosis present

## 2020-02-13 DIAGNOSIS — R8290 Unspecified abnormal findings in urine: Secondary | ICD-10-CM | POA: Diagnosis not present

## 2020-02-13 DIAGNOSIS — M25551 Pain in right hip: Secondary | ICD-10-CM

## 2020-02-13 DIAGNOSIS — E876 Hypokalemia: Secondary | ICD-10-CM | POA: Diagnosis present

## 2020-02-13 DIAGNOSIS — E1022 Type 1 diabetes mellitus with diabetic chronic kidney disease: Secondary | ICD-10-CM | POA: Diagnosis present

## 2020-02-13 DIAGNOSIS — I5042 Chronic combined systolic (congestive) and diastolic (congestive) heart failure: Secondary | ICD-10-CM | POA: Diagnosis present

## 2020-02-13 DIAGNOSIS — Z87891 Personal history of nicotine dependence: Secondary | ICD-10-CM

## 2020-02-13 DIAGNOSIS — Z79890 Hormone replacement therapy (postmenopausal): Secondary | ICD-10-CM

## 2020-02-13 LAB — CBC
HCT: 29.8 % — ABNORMAL LOW (ref 39.0–52.0)
Hemoglobin: 9.8 g/dL — ABNORMAL LOW (ref 13.0–17.0)
MCH: 30.6 pg (ref 26.0–34.0)
MCHC: 32.9 g/dL (ref 30.0–36.0)
MCV: 93.1 fL (ref 80.0–100.0)
Platelets: 160 10*3/uL (ref 150–400)
RBC: 3.2 MIL/uL — ABNORMAL LOW (ref 4.22–5.81)
RDW: 14.5 % (ref 11.5–15.5)
WBC: 8.3 10*3/uL (ref 4.0–10.5)
nRBC: 0 % (ref 0.0–0.2)

## 2020-02-13 LAB — BASIC METABOLIC PANEL
Anion gap: 10 (ref 5–15)
BUN: 36 mg/dL — ABNORMAL HIGH (ref 8–23)
CO2: 24 mmol/L (ref 22–32)
Calcium: 8.7 mg/dL — ABNORMAL LOW (ref 8.9–10.3)
Chloride: 107 mmol/L (ref 98–111)
Creatinine, Ser: 1.62 mg/dL — ABNORMAL HIGH (ref 0.61–1.24)
GFR, Estimated: 46 mL/min — ABNORMAL LOW (ref >60)
Glucose, Bld: 131 mg/dL — ABNORMAL HIGH (ref 70–99)
Potassium: 3.3 mmol/L — ABNORMAL LOW (ref 3.5–5.1)
Sodium: 141 mmol/L (ref 135–145)

## 2020-02-13 LAB — GLUCOSE, CAPILLARY
Glucose-Capillary: 114 mg/dL — ABNORMAL HIGH (ref 70–99)
Glucose-Capillary: 130 mg/dL — ABNORMAL HIGH (ref 70–99)
Glucose-Capillary: 105 mg/dL — ABNORMAL HIGH (ref 70–99)
Glucose-Capillary: 120 mg/dL — ABNORMAL HIGH (ref 70–99)

## 2020-02-13 MED ORDER — METOPROLOL SUCCINATE ER 25 MG PO TB24
25.0000 mg | ORAL_TABLET | Freq: Every day | ORAL | Status: DC
  Administered 2020-02-14 – 2020-03-04 (×20): 25 mg via ORAL
  Filled 2020-02-13 (×20): qty 1

## 2020-02-13 MED ORDER — BISACODYL 10 MG RE SUPP
10.0000 mg | Freq: Every day | RECTAL | Status: DC | PRN
  Filled 2020-02-13: qty 1

## 2020-02-13 MED ORDER — SACUBITRIL-VALSARTAN 49-51 MG PO TABS
1.0000 | ORAL_TABLET | Freq: Two times a day (BID) | ORAL | Status: DC
  Administered 2020-02-13 – 2020-03-04 (×40): 1 via ORAL
  Filled 2020-02-13 (×41): qty 1

## 2020-02-13 MED ORDER — AMIODARONE HCL 200 MG PO TABS
200.0000 mg | ORAL_TABLET | Freq: Two times a day (BID) | ORAL | Status: DC
  Administered 2020-02-13 – 2020-03-04 (×40): 200 mg via ORAL
  Filled 2020-02-13 (×40): qty 1

## 2020-02-13 MED ORDER — CLOPIDOGREL BISULFATE 75 MG PO TABS
75.0000 mg | ORAL_TABLET | Freq: Every day | ORAL | Status: DC
  Administered 2020-02-16 – 2020-03-04 (×18): 75 mg via ORAL
  Filled 2020-02-13 (×18): qty 1

## 2020-02-13 MED ORDER — ISOSORBIDE MONONITRATE ER 30 MG PO TB24
30.0000 mg | ORAL_TABLET | Freq: Every morning | ORAL | Status: DC
  Administered 2020-02-14 – 2020-03-03 (×19): 30 mg via ORAL
  Filled 2020-02-13 (×19): qty 1

## 2020-02-13 MED ORDER — DOCUSATE SODIUM 100 MG PO CAPS
100.0000 mg | ORAL_CAPSULE | Freq: Two times a day (BID) | ORAL | Status: DC
  Administered 2020-02-13 – 2020-03-04 (×29): 100 mg via ORAL
  Filled 2020-02-13 (×38): qty 1

## 2020-02-13 MED ORDER — ONDANSETRON HCL 4 MG PO TABS
4.0000 mg | ORAL_TABLET | Freq: Four times a day (QID) | ORAL | Status: DC | PRN
  Administered 2020-02-19 – 2020-02-20 (×2): 4 mg via ORAL
  Filled 2020-02-13 (×2): qty 1

## 2020-02-13 MED ORDER — FUROSEMIDE 40 MG PO TABS
40.0000 mg | ORAL_TABLET | Freq: Two times a day (BID) | ORAL | Status: DC
  Administered 2020-02-13 – 2020-03-04 (×40): 40 mg via ORAL
  Filled 2020-02-13 (×40): qty 1

## 2020-02-13 MED ORDER — LINAGLIPTIN 5 MG PO TABS
5.0000 mg | ORAL_TABLET | Freq: Every day | ORAL | Status: DC
  Administered 2020-02-14 – 2020-03-04 (×20): 5 mg via ORAL
  Filled 2020-02-13 (×20): qty 1

## 2020-02-13 MED ORDER — MELATONIN 3 MG PO TABS
6.0000 mg | ORAL_TABLET | Freq: Every day | ORAL | Status: DC
  Administered 2020-02-13 – 2020-03-03 (×20): 6 mg via ORAL
  Filled 2020-02-13 (×21): qty 2

## 2020-02-13 MED ORDER — HYDROMORPHONE HCL 2 MG PO TABS
2.0000 mg | ORAL_TABLET | ORAL | Status: DC | PRN
  Administered 2020-02-13 – 2020-02-14 (×3): 2 mg via ORAL
  Administered 2020-02-14: 4 mg via ORAL
  Administered 2020-02-14 (×2): 2 mg via ORAL
  Administered 2020-02-14 – 2020-02-15 (×2): 4 mg via ORAL
  Administered 2020-02-15 – 2020-02-16 (×4): 2 mg via ORAL
  Administered 2020-02-16 (×2): 4 mg via ORAL
  Administered 2020-02-16 – 2020-02-17 (×2): 2 mg via ORAL
  Administered 2020-02-17: 4 mg via ORAL
  Administered 2020-02-17: 2 mg via ORAL
  Administered 2020-02-18 – 2020-02-19 (×7): 4 mg via ORAL
  Filled 2020-02-13 (×3): qty 2
  Filled 2020-02-13 (×5): qty 1
  Filled 2020-02-13: qty 2
  Filled 2020-02-13 (×2): qty 1
  Filled 2020-02-13 (×6): qty 2
  Filled 2020-02-13 (×3): qty 1
  Filled 2020-02-13 (×2): qty 2
  Filled 2020-02-13 (×2): qty 1
  Filled 2020-02-13: qty 2

## 2020-02-13 MED ORDER — ATORVASTATIN CALCIUM 40 MG PO TABS
40.0000 mg | ORAL_TABLET | Freq: Every day | ORAL | Status: DC
  Administered 2020-02-14 – 2020-03-04 (×20): 40 mg via ORAL
  Filled 2020-02-13 (×20): qty 1

## 2020-02-13 MED ORDER — ONDANSETRON HCL 4 MG/2ML IJ SOLN: 4.0000 mg | Freq: Four times a day (QID) | INTRAMUSCULAR | Status: DC | PRN

## 2020-02-13 MED ORDER — LEVOTHYROXINE SODIUM 88 MCG PO TABS
88.0000 ug | ORAL_TABLET | Freq: Every day | ORAL | Status: DC
  Administered 2020-02-14 – 2020-03-04 (×20): 88 ug via ORAL
  Filled 2020-02-13 (×20): qty 1

## 2020-02-13 MED ORDER — ACETAMINOPHEN 650 MG RE SUPP: 650.0000 mg | RECTAL | Status: DC | PRN

## 2020-02-13 MED ORDER — CYCLOBENZAPRINE HCL 10 MG PO TABS
10.0000 mg | ORAL_TABLET | Freq: Three times a day (TID) | ORAL | Status: DC | PRN
  Administered 2020-02-14: 10 mg via ORAL
  Filled 2020-02-13 (×2): qty 1

## 2020-02-13 MED ORDER — INSULIN ASPART 100 UNIT/ML ~~LOC~~ SOLN
0.0000 [IU] | Freq: Three times a day (TID) | SUBCUTANEOUS | Status: DC
  Administered 2020-02-14 – 2020-02-15 (×2): 3 [IU] via SUBCUTANEOUS
  Administered 2020-02-15 – 2020-02-16 (×3): 2 [IU] via SUBCUTANEOUS
  Administered 2020-02-17: 3 [IU] via SUBCUTANEOUS
  Administered 2020-02-17 – 2020-02-21 (×8): 2 [IU] via SUBCUTANEOUS
  Administered 2020-02-21: 3 [IU] via SUBCUTANEOUS
  Administered 2020-02-21 – 2020-02-22 (×3): 2 [IU] via SUBCUTANEOUS
  Administered 2020-02-23: 3 [IU] via SUBCUTANEOUS
  Administered 2020-02-23: 2 [IU] via SUBCUTANEOUS
  Administered 2020-02-25: 3 [IU] via SUBCUTANEOUS
  Administered 2020-02-26 – 2020-02-29 (×8): 2 [IU] via SUBCUTANEOUS
  Administered 2020-02-29: 5 [IU] via SUBCUTANEOUS
  Administered 2020-03-01: 3 [IU] via SUBCUTANEOUS
  Administered 2020-03-01 – 2020-03-03 (×5): 2 [IU] via SUBCUTANEOUS

## 2020-02-13 MED ORDER — INFLUENZA VAC A&B SA ADJ QUAD 0.5 ML IM PRSY
0.5000 mL | PREFILLED_SYRINGE | INTRAMUSCULAR | Status: AC
  Administered 2020-02-18: 0.5 mL via INTRAMUSCULAR
  Filled 2020-02-13 (×2): qty 0.5

## 2020-02-13 MED ORDER — SENNA 8.6 MG PO TABS
2.0000 | ORAL_TABLET | Freq: Every day | ORAL | Status: DC
  Administered 2020-02-13 – 2020-03-04 (×21): 17.2 mg via ORAL
  Filled 2020-02-13 (×22): qty 2

## 2020-02-13 MED ORDER — NITROGLYCERIN 0.4 MG SL SUBL: 0.4000 mg | SUBLINGUAL_TABLET | SUBLINGUAL | Status: DC | PRN

## 2020-02-13 MED ORDER — TAMSULOSIN HCL 0.4 MG PO CAPS
0.4000 mg | ORAL_CAPSULE | Freq: Every day | ORAL | Status: DC
  Administered 2020-02-14 – 2020-03-04 (×20): 0.4 mg via ORAL
  Filled 2020-02-13 (×20): qty 1

## 2020-02-13 MED ORDER — ACETAMINOPHEN 325 MG PO TABS
650.0000 mg | ORAL_TABLET | ORAL | Status: DC | PRN
  Administered 2020-02-13 – 2020-03-03 (×9): 650 mg via ORAL
  Filled 2020-02-13 (×8): qty 2

## 2020-02-13 MED ORDER — VITAMIN D (ERGOCALCIFEROL) 1.25 MG (50000 UNIT) PO CAPS
50000.0000 [IU] | ORAL_CAPSULE | ORAL | Status: DC
  Administered 2020-02-17 – 2020-03-02 (×3): 50000 [IU] via ORAL
  Filled 2020-02-13 (×3): qty 1

## 2020-02-13 MED ORDER — ENSURE ENLIVE PO LIQD
237.0000 mL | Freq: Two times a day (BID) | ORAL | Status: DC
  Administered 2020-02-14 – 2020-03-02 (×17): 237 mL via ORAL

## 2020-02-13 MED FILL — Sodium Chloride IV Soln 0.9%: INTRAVENOUS | Qty: 1000 | Status: AC

## 2020-02-13 MED FILL — Heparin Sodium (Porcine) Inj 1000 Unit/ML: INTRAMUSCULAR | Qty: 30 | Status: AC

## 2020-02-13 NOTE — Progress Notes (Signed)
To rehab per bed accompanied by Nurse and wife.Alert ; discussed fall bundle precaution. Oriented to unit set up.

## 2020-02-13 NOTE — Discharge Summary (Signed)
Physician Discharge Summary  Patient ID: James [name]. MRN: [id number] DOB/AGE: 69/01/1951 69 y.o.  Admit date: 02/10/2020 Discharge date: 02/13/2020  Admission Diagnoses: L3-4 and L4-5 spondylolisthesis, facet arthropathy, spinal stenosis, lumbago, lumbar radiculopathy, neurogenic claudication  Discharge Diagnoses: The same Active Problems:   Spondylolisthesis of lumbar region   Discharged Condition: good  Hospital Course: I performed an L3-4 and L4-5 decompression, instrumentation and fusion on the patient on 02/10/2020.  The surgery went well.  The patient's postoperative course was fairly unremarkable.  We had cardiology see him for management of his heart disease/pacemaker.  PT, OT and rehab worked with the patient.  He was felt to be a good candidate for inpatient rehab when a bed was available on 02/13/2020.  He was discharged to the rehab unit.  The patient, and his wife, were given oral and written discharge instructions.  All his questions were answered.  Consults: PT, OT, rehab, care management Significant Diagnostic Studies: None Treatments: L3-4 and L4-5 decompression, instrumentation and fusion. Discharge Exam: Blood pressure 131/64, pulse (!) 59, temperature 99.3 F (37.4 C), temperature source Oral, resp. rate 18, height 5\' 8"  (1.727 m), weight 103.5 kg, SpO2 94 %. The patient is alert and pleasant.  He looks well.  He is moving his lower extremities well.  Disposition: Inpatient rehab  Discharge Instructions    Call MD for:  difficulty breathing, headache or visual disturbances   Complete by: As directed    Call MD for:  extreme fatigue   Complete by: As directed    Call MD for:  hives   Complete by: As directed    Call MD for:  persistant dizziness or light-headedness   Complete by: As directed    Call MD for:  persistant nausea and vomiting   Complete by: As directed    Call MD for:  redness, tenderness, or signs of infection (pain, swelling, redness,  odor or green/yellow discharge around incision site)   Complete by: As directed    Call MD for:  severe uncontrolled pain   Complete by: As directed    Call MD for:  temperature >100.4   Complete by: As directed    Diet - low sodium heart healthy   Complete by: As directed    Discharge instructions   Complete by: As directed    Call [phone] for a followup appointment. Take a stool softener while you are using pain medications.   Driving Restrictions   Complete by: As directed    Do not drive for 2 weeks.   Increase activity slowly   Complete by: As directed    Lifting restrictions   Complete by: As directed    Do not lift more than 5 pounds. No excessive bending or twisting.   May shower / Bathe   Complete by: As directed    Remove the dressing for 3 days after surgery.  You may shower, but leave the incision alone.   No dressing needed   Complete by: As directed      Allergies as of 02/13/2020      Reactions   Baclofen Other (See Comments)   Mental status changes   Codeine Rash      Medication List    TAKE these medications   acetaminophen 500 MG tablet Commonly known as: TYLENOL Take 500 mg by mouth 2 (two) times daily.   amiodarone 200 MG tablet Commonly known as: PACERONE Take 200 mg by mouth 2 (two) times daily.   atorvastatin  40 MG tablet Commonly known as: LIPITOR TAKE 1 TABLET BY MOUTH ONCE DAILY. *NEEDS APPOINTMENT FOR REFILLS* What changed: See the new instructions.   b complex vitamins capsule Take 1 capsule by mouth daily.   clopidogrel 75 MG tablet Commonly known as: PLAVIX Take 1 tablet (75 mg total) by mouth daily.   CoQ10 100 MG Caps Take 100 mg by mouth daily.   Entresto 49-51 MG Generic drug: sacubitril-valsartan Take 1 tablet by mouth 2 (two) times daily.   ergocalciferol 1.25 MG (50000 UT) capsule Commonly known as: VITAMIN D2 Take 50,000 Units by mouth every Monday.   furosemide 40 MG tablet Commonly known as: LASIX TAKE 1  TABLET BY MOUTH TWICE(2) DAILY What changed:   how much to take  how to take this  when to take this  additional instructions   insulin detemir 100 UNIT/ML injection Commonly known as: LEVEMIR Inject 10 Units into the skin daily.   isosorbide mononitrate 30 MG 24 hr tablet Commonly known as: IMDUR Take 1 tablet by mouth every morning. (Hold if blood pressure under 100 sbp)   levothyroxine 88 MCG tablet Commonly known as: SYNTHROID Take 88 mcg by mouth daily before breakfast.   LORazepam 0.5 MG tablet Commonly known as: ATIVAN Take 0.5 mg by mouth See admin instructions. Take 0.5 mg at bedtime may take a second 0.5 mg during the day as needed for anxiety   magnesium hydroxide 400 MG/5ML suspension Commonly known as: MILK OF MAGNESIA Take 15 mLs by mouth daily as needed for mild constipation.   melatonin 3 MG Tabs tablet Take 6 mg by mouth at bedtime.   metoprolol succinate 25 MG 24 hr tablet Commonly known as: Toprol XL Take 1 tablet (25 mg total) by mouth daily.   multivitamin capsule Take 1 capsule by mouth daily.   nitroGLYCERIN 0.4 MG SL tablet Commonly known as: NITROSTAT Place 1 tablet (0.4 mg total) under the tongue every 5 (five) minutes as needed for chest pain.   Nyamyc powder Generic drug: nystatin Apply 1 application topically daily as needed for irritation.   omega-3 acid ethyl esters 1 g capsule Commonly known as: LOVAZA Take 2 capsules (2 g total) by mouth in the morning and at bedtime. What changed: how much to take   senna 8.6 MG tablet Commonly known as: SENOKOT Take 2 tablets by mouth daily.   sitaGLIPtin 25 MG tablet Commonly known as: JANUVIA Take 25 mg by mouth daily.   tamsulosin 0.4 MG Caps capsule Commonly known as: FLOMAX Take 0.4 mg by mouth daily.   vitamin C 1000 MG tablet Take 1,000 mg by mouth 2 (two) times daily.   Zinc 50 MG Tabs Take 50 mg by mouth daily.            Discharge Care Instructions  (From  admission, onward)         Start     Ordered   02/13/20 0000  No dressing needed        02/13/20 1117          Follow-up Information    Physicians, [facility]. Schedule an appointment as soon as possible for a visit.   Specialty: Family Medicine Why: Please follow up with your primary care physicians in 7-10 days from your discharge home from the hospital. Contact information: [place] [address] [phone]               Signed: Ophelia Charter 02/13/2020, 11:18  AM    

## 2020-02-13 NOTE — H&P (Signed)
Physical Medicine and Rehabilitation Admission H&P     HPI: James [name], James [name]. is a 69 year old right-handed male with history of CKD with creatinine 1.47, CAD/non-STEMI/CABG /ICD 2015 maintained on amiodarone as well as Plavix followed by cardiology services Dr. Bettina Gavia in [facility], chronic systolic congestive heart failure maintained on Entresto, CKD stage III as well as right nephrectomy 2019, COPD, diabetes mellitus, hypertension, hyperlipidemia.  Per chart review patient lives with spouse.  1 level home one-step to entry.  Used a Rollator prior to admission.  Wife does assist with some ADLs.  Presented 02/10/2020 with progressive low back pain radiating to lower extremities right greater than left.  MRI demonstrated L3-4 and L4-5 spinal listhesis facet arthropathy spinal stenosis with radiculopathy.  Patient underwent bilateral L3-4 and 4-5 laminotomy foraminotomy to decompress bilateral L3-4-5 nerve roots with posterior lumbar interbody fusion as well as transforaminal lumbar interbody fusion 02/10/2020 per Dr. Arnoldo Morale.  Lumbar corset when out of bed.  Monitoring of renal function latest creatinine 1.62 from a baseline of 1.47-1.50.  Acute blood loss anemia 9.8 and monitored.  Plan to resume Plavix 5 days postop.  Therapy evaluations completed and patient was admitted for a comprehensive rehab program.  Pt reports no BM since Sunday- needs to go- pain well controlled overall with Dilaudid- a little SOB, mild and chronic.  Feels like neuro issues are "resolved" even though still real weak. Has been weak since 3/21 since took baclofen and had overdose/allergic response, since has single kidney.   Walking with RW since 3/21- has a bad L shoulder as well- used ot play guitar, not lately- wants ot again- explained did back surgery, not neck surgery- back surgery will not "fix" hand issues and neuropathy.   Says his A1c is 5.9 Was doing in/out caths at home until 3 weeks ago-  - OK'd  per PCP-   Review of Systems  Constitutional: Negative for chills and fever.  HENT: Negative for hearing loss.   Eyes: Negative for blurred vision and double vision.  Respiratory: Positive for shortness of breath. Negative for cough.   Cardiovascular: Positive for palpitations and leg swelling. Negative for chest pain.  Gastrointestinal: Positive for constipation. Negative for heartburn, nausea and vomiting.  Genitourinary: Positive for urgency. Negative for dysuria and hematuria.  Musculoskeletal: Positive for back pain, joint pain and myalgias.  Skin: Negative for rash.  All other systems reviewed and are negative.  Past Medical History:  Diagnosis Date  . Abnormal nuclear stress test 11/03/2017   Added automatically from request for surgery [phone]  . Benign hypertension with chronic kidney disease, stage III ([facility]) 04/11/2018  . CAD in native artery 08/27/2014  . Chronic systolic congestive heart failure ([place]) 10/26/2017  . CKD stage 3 due to type 1 diabetes mellitus ([place]) 08/28/2014   Overview:  Cr 1.3 at discharge 08/21/14  . COPD GOLD II 10/17/2014   Followed in Pulmonary clinic/ [facility]/ [place]  - PFTs   10/17/2014 FEV1  2.09 ( 53%) ratio 62 no sign better p B2 and dlco 63% and corrects to 85%  - 10/17/2014 p extensive coaching HFA effectiveness =  90%    > try stiolto    . DM (diabetes mellitus) ([place])   . Dyspnea 09/05/2014   Followed in Pulmonary clinic/ [facility]/ [place] - 09/05/2014  Walked RA x 3 laps @ 185 ft each stopped due to end of study, nl pace, no desat   - pfts 10/17/14 dlco 63% on Amiodarone >  may benefit from 6 month f/u but defer to Cards    . Essential hypertension 09/11/2014   Changed acei to ARB  09/05/14 due to pseudowheeze> improved 10/17/14    . Hyperlipidemia   . Hypertension   . Hypertensive heart disease with congestive heart failure ([place]) 08/27/2014  . ICD (implantable cardioverter-defibrillator) in place 08/27/2014  . Metabolic bone disease 87/56/4332  .  NSTEMI (non-ST elevated myocardial infarction) ([place]) 02/12/2014  . Obesity 9/51/8841   Complicated by dm/ hbp and low erv on pfts 10/17/14    . On amiodarone therapy 08/27/2014  . Renal oncocytoma of right kidney 01/29/2018  . S/P ICD (internal cardiac defibrillator) procedure 02/15/2014   followed by [facility] EP  . Typical atrial flutter ([place]) 08/27/2014  . Ventricular tachyarrhythmia ([place]) 08/27/2014  . Vitamin D deficiency 01/17/2018   Past Surgical History:  Procedure Laterality Date  . CORONARY ARTERY BYPASS GRAFT  2008  . EYE SURGERY    . ICD IMPLANT    . MOHS SURGERY     Right Arm  . NEPHRECTOMY Right    Family History  Problem Relation Age of Onset  . Heart disease Mother   . Breast cancer Mother   . Heart disease Father   . Cancer Daughter   . COPD Brother    Social History:  reports that he quit smoking about 13 years ago. His smoking use included cigarettes. He has a 38.00 pack-year smoking history. He has never used smokeless tobacco. He reports that he does not drink alcohol and does not use drugs. Allergies:  Allergies  Allergen Reactions  . Baclofen Other (See Comments)    Mental status changes  . Codeine Rash   Medications Prior to Admission  Medication Sig Dispense Refill  . acetaminophen (TYLENOL) 500 MG tablet Take 500 mg by mouth 2 (two) times daily.     Marland Kitchen amiodarone (PACERONE) 200 MG tablet Take 200 mg by mouth 2 (two) times daily.    . Ascorbic Acid (VITAMIN C) 1000 MG tablet Take 1,000 mg by mouth 2 (two) times daily.    Marland Kitchen atorvastatin (LIPITOR) 40 MG tablet TAKE 1 TABLET BY MOUTH ONCE DAILY. *NEEDS APPOINTMENT FOR REFILLS* (Patient taking differently: Take 40 mg by mouth daily. ) 90 tablet 2  . b complex vitamins capsule Take 1 capsule by mouth daily.    . clopidogrel (PLAVIX) 75 MG tablet Take 1 tablet (75 mg total) by mouth daily. 90 tablet 1  . Coenzyme Q10 (COQ10) 100 MG CAPS Take 100 mg by mouth daily.    . ergocalciferol (VITAMIN D2) 1.25 MG  (50000 UT) capsule Take 50,000 Units by mouth every Monday.     . furosemide (LASIX) 40 MG tablet TAKE 1 TABLET BY MOUTH TWICE(2) DAILY (Patient taking differently: Take 40 mg by mouth 2 (two) times daily. ) 180 tablet 1  . insulin detemir (LEVEMIR) 100 UNIT/ML injection Inject 10 Units into the skin daily.     . isosorbide mononitrate (IMDUR) 30 MG 24 hr tablet Take 1 tablet by mouth every morning. (Hold if blood pressure under 100 sbp)    . levothyroxine (SYNTHROID, LEVOTHROID) 88 MCG tablet Take 88 mcg by mouth daily before breakfast.     . LORazepam (ATIVAN) 0.5 MG tablet Take 0.5 mg by mouth See admin instructions. Take 0.5 mg at bedtime may take a second 0.5 mg during the day as needed for anxiety    . magnesium hydroxide (MILK OF MAGNESIA) 400 MG/5ML suspension Take 15  mLs by mouth daily as needed for mild constipation.    . melatonin 3 MG TABS tablet Take 6 mg by mouth at bedtime.     . metoprolol succinate (TOPROL XL) 25 MG 24 hr tablet Take 1 tablet (25 mg total) by mouth daily. 90 tablet 3  . Multiple Vitamin (MULTIVITAMIN) capsule Take 1 capsule by mouth daily.    . nitroGLYCERIN (NITROSTAT) 0.4 MG SL tablet Place 1 tablet (0.4 mg total) under the tongue every 5 (five) minutes as needed for chest pain. 25 tablet 5  . NYAMYC powder Apply 1 application topically daily as needed for irritation.    Marland Kitchen omega-3 acid ethyl esters (LOVAZA) 1 g capsule Take 2 capsules (2 g total) by mouth in the morning and at bedtime. (Patient taking differently: Take 1 g by mouth in the morning and at bedtime. ) 120 capsule 3  . sacubitril-valsartan (ENTRESTO) 49-51 MG Take 1 tablet by mouth 2 (two) times daily. 60 tablet 3  . senna (SENOKOT) 8.6 MG tablet Take 2 tablets by mouth daily.     . sitaGLIPtin (JANUVIA) 25 MG tablet Take 25 mg by mouth daily.     . tamsulosin (FLOMAX) 0.4 MG CAPS capsule Take 0.4 mg by mouth daily.     . Zinc 50 MG TABS Take 50 mg by mouth daily.       Drug Regimen Review Drug  regimen was reviewed and remains appropriate with no significant issues identified  Home: Home Living Family/patient expects to be discharged to:: Private residence Living Arrangements: Spouse/significant other   Functional History:    Functional Status:  Mobility:          ADL:    Cognition: Cognition Orientation Level: Oriented X4    Physical Exam: Blood pressure 140/64, pulse (!) 59, temperature 99.4 F (37.4 C), temperature source Oral, resp. rate 16, height 5\' 8"  (1.727 m), weight 102 kg, SpO2 94 %. Physical Exam Vitals and nursing note reviewed. Exam conducted with a chaperone present.  Constitutional:      Appearance: He is obese.     Comments: Pt awake, but sleepy, appropriate, wife joined [place] shortly afterwards, NAD  HENT:     Head: Normocephalic and atraumatic.     Comments: Has purple bruise on R cheek Face symmetrical    Right Ear: External ear normal.     Left Ear: External ear normal.     Nose: Nose normal. No congestion or rhinorrhea.     Mouth/Throat:     Mouth: Mucous membranes are dry.     Pharynx: Oropharynx is clear. No oropharyngeal exudate.  Eyes:     General:        Right eye: No discharge.        Left eye: No discharge.     Extraocular Movements: Extraocular movements intact.  Cardiovascular:     Comments: RRR- no JVD Pulmonary:     Comments: Good breath sounds/air movement- no W/R/R- appears slightly SOB when puts head of bed down Abdominal:     Comments: Protuberant, soft, NT; somewhat distended?, hypoactive BS  Musculoskeletal:     Cervical back: Neck supple.     Comments: UEs- 4/5 in deltoids, biceps,triceps and 4+/5 in WE, grip and finger abd L>R hand/fingers swollen/stiff- per wife, chronic  LEs- RLE- HF 4-/5, KE 4/5, DF 2/5, and PF 4/5 LLE- HF 4/5, KE 4+/5, DF 2/5, PF 4/5   Skin:    Comments: Back incision is dressed  Also, has dressing on  sacrum for prevention, but no skin breakdown seen Heels OK/buttocks OK IV L  shoulder and R wrist- look OK  Neurological:     Comments: Patient is alert in no acute distress.  Oriented x3 and follows commands.  Fair medical historian  Decreased sensation in stocking/glove distribution Also "normal" on LLE now; reduced on entire RLE.    Psychiatric:     Comments: Appropriate, cordial     Results for orders placed or performed during the hospital encounter of 02/13/20 (from the past 48 hour(s))  Glucose, capillary     Status: Abnormal   Collection Time: 02/13/20  5:20 PM  Result Value Ref Range   Glucose-Capillary 105 (H) 70 - 99 mg/dL    Comment: Glucose reference range applies only to samples taken after fasting for at least 8 hours.   No results found.     Medical Problem List and Plan: 1.  Decreased functional mobility secondary to L3-4 4-5 spondylolisthesis arthropathy with radiculopathy/spinal stenosis.  Status post bilateral L3-4 4-5 laminotomy foraminotomies decompression interbody fusion 02/10/2020.  Back corset when out of bed  -patient may  Shower if covers back incision  -ELOS/Goals: 12- 16 days- min A to supervision 2.  Antithrombotics: -DVT/anticoagulation: SCDs.  Check vascular study -will check with NSU when can add Lovenox  -antiplatelet therapy: Resume Plavix 75 mg daily postop day #5 3. Pain Management: Flexeril as needed, Dilaudid 2-4 mg every 4 hours as needed pain 4. Mood: Provide emotional support  -antipsychotic agents: N/A 5. Neuropsych: This patient is capable of making decisions on his own behalf. 6. Skin/Wound Care: Routine skin checks 7. Fluids/Electrolytes/Nutrition: Routine in and outs with with follow-up chemistries 8.  Acute on chronic anemia.  Follow-up CBC 9.  CAD with CABG/ICD.  Amiodarone 200 mg twice daily, Imdur 30 mg daily, Toprol 25 mg daily 10  COPD.  Check oxygen saturations every shift 11.  CKD stage III with history of right nephrectomy. Has single L kidney  Baseline creatinine 1.47-1.50 12.  Diabetes  mellitus.  Hemoglobin A1c 5.9.  Currently on Tradjenta 5 mg daily.  Patient on Levemir 10 units daily prior to admission.  Resume as needed 13.  Chronic systolic congestive heart failure.  Entresto 49-51 mg twice daily, Lasix 40 mg twice daily.  Monitor for any signs of fluid overload. 14.  Hypertension.  Monitor with increased mobility 15.  Hyperlipidemia.  Lipitor 16.  Hypothyroidism.  Synthroid 17.  BPH.  Flomax 0.4 mg daily.  Check PVR- was doing in/put caths at home until 3 weeks ago- might need to resume 18. Insomnia- takes melatonin 6 mg QHS_ will resume   Oneal Deputy, PA-C  I have personally performed a face to face diagnostic evaluation of this patient and formulated the key components of the plan.  Additionally, I have personally reviewed laboratory data, imaging studies, as well as relevant notes and concur with the physician assistant's documentation above.   The patient's status has not changed from the original H&P.  Any changes in documentation from the acute care chart have been noted above.    [name], MD 02/13/2020

## 2020-02-13 NOTE — Consult Note (Signed)
   [facility] CM Inpatient Consult   02/13/2020  [place]. 03-Apr-1951 [id number]   .Patient was listed initially showing as active with [facility] for chronic disease management services. Made [facility] RNCM aware of patient's status on 02/11/20 for confirmation.  However, patient had not been active with care management team since May. Chart review reveals patient is being recommended for an inpatient rehab level of care post acute hospital transition.  Plan: Can follow periodically for progress and needs for post rehab transition if at [facility]'s CIR.   For additional questions or referrals please contact:  Natividad Brood, RN BSN [facility] Liaison  [phone] business mobile phone Toll free office [phone]  Fax number: [phone] [email or web address]

## 2020-02-13 NOTE — Progress Notes (Signed)
Inpatient Rehabilitation Medication Review by a Pharmacist  A complete drug regimen review was completed for this patient to identify any potential clinically significant medication issues.  Clinically significant medication issues were identified:  yes   Type of Medication Issue Identified Description of Issue Urgent (address now) Non-Urgent (address on AM team rounds) Plan Plan Accepted by Provider? (Yes / No / Pending AM Rounds)  Drug Interaction(s) (clinically significant)       Duplicate Therapy       Allergy       No Medication Administration End Date       Incorrect Dose       Additional Drug Therapy Needed  Pharmacologic DVT prophx?  Message Dr. Dagoberto Ligas, may be too soon post-spinal surgery.   Other  Home Vit B, C, D, MV, Zinc, melatonin and Levemir       Name of provider notified for urgent issues identified: [place]  Provider Method of Notification: Chat to be addressed 11/12   For non-urgent medication issues to be resolved on team rounds tomorrow morning a [facility] [place] was sent to:    Pharmacist comments:   Time spent performing this drug regimen review (minutes):  5-48min    Calandria Mullings S. Alford Highland, PharmD, BCPS Clinical Staff Pharmacist [email or web address]  Wayland Salinas 02/13/2020 4:04 PM

## 2020-02-13 NOTE — Progress Notes (Signed)
Inpatient Rehabilitation-Admissions Coordinator   Received medical clearance from Dr. Arnoldo Morale for admit to CIR today. Notified pt and his wife of bed offer and they have accepted. Reviewed insurance benefits and consent forms with pt and his wife. All questions answered. RN and [facility] team aware of plan for admit today. Please call if questions.   Raechel Ache, OTR/L  Rehab Admissions Coordinator  [phone] 02/13/2020 11:23 AM

## 2020-02-13 NOTE — Progress Notes (Signed)
James Heys, MD  Physician  Physical Medicine and Rehabilitation  PMR Pre-admission      Signed  Date of Service:  02/11/2020  2:09 PM      Related encounter: Admission (Discharged) from 02/10/2020 in [place]       Show:Clear all _0 Manual_1 Template_2 Copied  Added by: _3 Raechel Ache, Argie Ramming, MD  _5 Hover for details PMR Admission Coordinator Pre-Admission Assessment   Patient: James [name]. is an 69 y.o., male MRN: [id number] DOB: 1950/09/08 Height: _6  (172.7 cm) Weight: 103.5 kg   Insurance Information HMO:     PPO:      PCP:      IPA:      80/20: yes     OTHER:  PRIMARY: medicare A and B      Policy#: [id number]      Subscriber: patient CM Name:       Phone#:      Fax#:  Pre-Cert#:       Employer:  Benefits:  Phone #: online     Name: verified eligibility via Lead on 02/11/20 Eff. Date: Part A and B effective 03/04/2016     Deduct: $1,484      Out of Pocket Max:      Life Max:  CIR: Covered per Medicare guidelines once yearly deductible has been met      SNF: days 1-20, 100%, days 21-100, 80% Outpatient: 80%     Co-Pay: 20% Home Health: 100%      Co-Pay:  DME: 80%     Co-Pay: 20% Providers: Pt's choice SECONDARY: BCBS       Policy#: [id number]     Phone#: [phone]   Financial Counselor:       Phone#:    The "Data Collection Information Summary" for patients in Inpatient Rehabilitation Facilities with attached "Privacy Act [place] Records" was provided and verbally reviewed with: Patient and Family   Emergency Contact Information         Contact Information     Name Relation Home Work Mobile    James [name] Spouse     [phone]         Current Medical History  Patient Admitting Diagnosis:L3-4 and L4-5 spinal stenosis, lumbago, lumbar radiculopathy, neurogenic claudication, facet arthropathy s/p decompression and fusion   History of Present Illness: James C.  [name], Schum. is a 69 year old right-handed male with history of CAD/non-STEMI/CABG /ICD 2015 maintained on amiodarone as well as Plavix followed by cardiology services Dr. Bettina Gavia in [facility], chronic systolic congestive heart failure maintained on Entresto, CKD stage III as well as right nephrectomy 2019, COPD, diabetes mellitus, hypertension, hyperlipidemia.  Per chart review patient lives with spouse.  1 level home one-step to entry.  Used a Rollator prior to admission.  Wife does assist with some ADLs.  Presented 02/10/2020 with progressive low back pain radiating to lower extremities right greater than left.  MRI demonstrated L3-4 and L4-5 spinal listhesis facet arthropathy spinal stenosis with radiculopathy.  Patient underwent bilateral L3-4 and 4-5 laminotomy foraminotomy to decompress bilateral L3-4-5 nerve roots with posterior lumbar interbody fusion as well as transforaminal lumbar interbody fusion 02/10/2020 per Dr. Arnoldo Morale.  Lumbar corset when out of bed.  Await plan to resume Plavix therapy.  Therapy evaluations completed and patient is to be admitted for a comprehensive rehab program on 02/13/20.    Patient's medical record from [facility] has been reviewed by the  rehabilitation admission coordinator and physician.   Past Medical History      Past Medical History:  Diagnosis Date  . Abnormal nuclear stress test 11/03/2017    Added automatically from request for surgery [phone]  . Benign hypertension with chronic kidney disease, stage III ([place]) 04/11/2018  . CAD in native artery 08/27/2014  . Chronic systolic congestive heart failure ([place]) 10/26/2017  . CKD stage 3 due to type 1 diabetes mellitus ([place]) 08/28/2014    Overview:  Cr 1.3 at discharge 08/21/14  . COPD GOLD II 10/17/2014    Followed in Pulmonary clinic/ [facility]/ [place]  - PFTs   10/17/2014 FEV1  2.09 ( 53%) ratio 62 no sign better p B2 and dlco 63% and corrects to 85%  - 10/17/2014 p extensive  coaching HFA effectiveness =  90%    > try stiolto    . DM (diabetes mellitus) ([place])    . Dyspnea 09/05/2014    Followed in Pulmonary clinic/ [facility]/ [place] - 09/05/2014  Walked RA x 3 laps @ 185 ft each stopped due to end of study, nl pace, no desat   - pfts 10/17/14 dlco 63% on Amiodarone > may benefit from 6 month f/u but defer to Cards    . Essential hypertension 09/11/2014    Changed acei to ARB  09/05/14 due to pseudowheeze> improved 10/17/14    . Hyperlipidemia    . Hypertension    . Hypertensive heart disease with congestive heart failure ([place]) 08/27/2014  . ICD (implantable cardioverter-defibrillator) in place 08/27/2014  . Metabolic bone disease 04/12/3233  . NSTEMI (non-ST elevated myocardial infarction) ([place]) 02/12/2014  . Obesity 5/73/2202    Complicated by dm/ hbp and low erv on pfts 10/17/14    . On amiodarone therapy 08/27/2014  . Renal oncocytoma of right kidney 01/29/2018  . S/P ICD (internal cardiac defibrillator) procedure 02/15/2014    followed by [facility] EP  . Typical atrial flutter ([place]) 08/27/2014  . Ventricular tachyarrhythmia ([place]) 08/27/2014  . Vitamin D deficiency 01/17/2018      Family History   family history includes Breast cancer in his mother; COPD in his brother; Cancer in his daughter; Heart disease in his father and mother.   Prior Rehab/Hospitalizations Has the patient had prior rehab or hospitalizations prior to admission? No   Has the patient had major surgery during 100 days prior to admission? Yes              Current Medications   Current Facility-Administered Medications:  .  0.9 %  sodium chloride infusion, 250 mL, Intravenous, Continuous, Newman Pies, MD, Last Rate: 1 mL/hr at 02/10/20 1645, 250 mL at 02/10/20 1645 .  acetaminophen (TYLENOL) tablet 650 mg, 650 mg, Oral, Q4H PRN **OR** acetaminophen (TYLENOL) suppository 650 mg, 650 mg, Rectal, Q4H PRN, Newman Pies, MD .  amiodarone (PACERONE) tablet 200 mg, 200 mg, Oral, BID,  Newman Pies, MD, 200 mg at 02/13/20 1059 .  atorvastatin (LIPITOR) tablet 40 mg, 40 mg, Oral, Daily, Newman Pies, MD, 40 mg at 02/13/20 1059 .  bisacodyl (DULCOLAX) suppository 10 mg, 10 mg, Rectal, Daily PRN, Newman Pies, MD .  cyclobenzaprine (FLEXERIL) tablet 10 mg, 10 mg, Oral, TID PRN, Newman Pies, MD, 10 mg at 02/11/20 2026 .  docusate sodium (COLACE) capsule 100 mg, 100 mg, Oral, BID, Newman Pies, MD, 100 mg at 02/13/20 1059 .  furosemide (LASIX) tablet 40 mg, 40 mg, Oral, BID, Newman Pies, MD, 40 mg at 02/13/20 1059 .  HYDROmorphone (DILAUDID) tablet 2-4 mg, 2-4 mg, Oral, Q4H PRN, Newman Pies, MD, 2 mg at 02/13/20 7829 .  insulin aspart (novoLOG) injection 0-15 Units, 0-15 Units, Subcutaneous, TID WC, Newman Pies, MD, 3 Units at 02/12/20 1850 .  insulin aspart (novoLOG) injection 0-5 Units, 0-5 Units, Subcutaneous, QHS, Newman Pies, MD, 2 Units at 02/10/20 2200 .  isosorbide mononitrate (IMDUR) 24 hr tablet 30 mg, 30 mg, Oral, q morning - 10a, Newman Pies, MD, 30 mg at 02/13/20 1059 .  levothyroxine (SYNTHROID) tablet 88 mcg, 88 mcg, Oral, QAC breakfast, Newman Pies, MD, 88 mcg at 02/13/20 0607 .  linagliptin (TRADJENTA) tablet 5 mg, 5 mg, Oral, Daily, Newman Pies, MD, 5 mg at 02/13/20 1059 .  menthol-cetylpyridinium (CEPACOL) lozenge 3 mg, 1 lozenge, Oral, PRN **OR** phenol (CHLORASEPTIC) mouth spray 1 spray, 1 spray, Mouth/Throat, PRN, Newman Pies, MD .  metoprolol succinate (TOPROL-XL) 24 hr tablet 25 mg, 25 mg, Oral, Daily, Newman Pies, MD, 25 mg at 02/13/20 1059 .  morphine 4 MG/ML injection 4 mg, 4 mg, Intravenous, Q2H PRN, Newman Pies, MD .  nitroGLYCERIN (NITROSTAT) SL tablet 0.4 mg, 0.4 mg, Sublingual, Q5 min PRN, Newman Pies, MD .  ondansetron [facility]) tablet 4 mg, 4 mg, Oral, Q6H PRN **OR** ondansetron (ZOFRAN) injection 4 mg, 4 mg, Intravenous, Q6H PRN, Newman Pies, MD .  pneumococcal 23 valent  vaccine (PNEUMOVAX-23) injection 0.5 mL, 0.5 mL, Intramuscular, Tomorrow-1000, Newman Pies, MD .  sacubitril-valsartan (ENTRESTO) 49-51 mg per tablet, 1 tablet, Oral, BID, Newman Pies, MD, 1 tablet at 02/13/20 1059 .  sodium chloride flush (NS) 0.9 % injection 3 mL, 3 mL, Intravenous, Q12H, Newman Pies, MD, 3 mL at 02/12/20 2054 .  sodium chloride flush (NS) 0.9 % injection 3 mL, 3 mL, Intravenous, PRN, Newman Pies, MD .  tamsulosin [facility]) capsule 0.4 mg, 0.4 mg, Oral, Daily, Newman Pies, MD, 0.4 mg at 02/13/20 1059   Patients Current Diet:     Diet Order                      Diet - low sodium heart healthy              Diet Carb Modified Fluid consistency: Thin; Room service appropriate? Yes  Diet effective now                      Precautions / Restrictions Precautions Precautions: Back Precaution Booklet Issued: Yes (comment) Spinal Brace: Lumbar corset Restrictions Weight Bearing Restrictions: No    Has the patient had 2 or more falls or a fall with injury in the past year? No   Prior Activity Level   Prior Functional Level Self Care: Did the patient need help bathing, dressing, using the toilet or eating? Needed some help   Indoor Mobility: Did the patient need assistance with walking from room to room (with or without device)? Independent   Stairs: Did the patient need assistance with internal or external stairs (with or without device)? Needed some help   Functional Cognition: Did the patient need help planning regular tasks such as shopping or remembering to take medications? [place] / [place] Devices/Equipment: Gilford Rile (specify type) Home Equipment: Walker - 4 wheels, Toilet riser, Walker - 2 wheels   Prior Device Use: Indicate devices/aids used by the patient prior to current illness, exacerbation or injury? None of the above   Current Functional Level Cognition   Overall Cognitive  Status: Impaired/Different from baseline Orientation Level: Oriented X4 Safety/Judgement: Decreased awareness of safety, Decreased awareness of deficits General Comments: Pt able to recall 2/3 BLT precautions with cues patient able to name lifting.  Cues required throughout mobility for sequencing and safety     Extremity Assessment (includes Sensation/Coordination)   Upper Extremity Assessment: Generalized weakness, LUE deficits/detail LUE Deficits / Details: active shoulder ROM to 75*, PROM +10 mor degrees with crepitation LUE: Unable to fully assess due to pain LUE Sensation: WNL LUE Coordination: decreased gross motor  Lower Extremity Assessment: Defer to PT evaluation     ADLs   Overall ADL's : Needs assistance/impaired Eating/Feeding: Set up, Sitting Grooming: Set up, Sitting Upper Body Bathing: Minimal assistance, Sitting Lower Body Bathing: Sit to/from stand, Maximal assistance Upper Body Dressing : Minimal assistance, Sitting Lower Body Dressing: Total assistance, Sit to/from stand Lower Body Dressing Details (indicate cue type and reason): Total A to don socks. With increased time/effort, able to cross R LE but difficulty in doing so Toilet Transfer: Maximal assistance, +2 for physical assistance, +2 for safety/equipment, Stand-pivot, RW Toilet Transfer Details (indicate cue type and reason): simaulted to recliner Toileting- Clothing Manipulation and Hygiene: Total assistance, Sit to/from stand General ADL Comments: Limited by decreased B LE strength, poor standing balance and back precautions but improved pain in comparison to pre-surgery      Mobility   Overal bed mobility: Needs Assistance Bed Mobility: Supine to Sit Rolling: Min guard Supine to sit: Max assist, +2 for physical assistance, +2 for safety/equipment, HOB elevated Sit to supine: Max assist Sit to sidelying: Mod assist, +2 for physical assistance General bed mobility comments: cues for log roll technique.  Assist required for LEs and trunk descent     Transfers   Overall transfer level: Needs assistance Equipment used: Rolling walker (2 wheeled) Transfers: Sit to/from Stand Sit to Stand: Mod assist, +2 physical assistance Stand pivot transfers: +2 safety/equipment, Min assist General transfer comment: Mod A +2 to stand from lower surface. Pt with improved steadiness, only requiring min A +2 for steadying to transfer back to bed. Required multimodal cues for sequencing.      Ambulation / Gait / Stairs / Wheelchair Mobility   Ambulation/Gait Ambulation/Gait assistance: Herbalist (Feet): 2 Feet Assistive device: Rolling walker (2 wheeled) Gait Pattern/deviations: Step-to pattern General Gait Details: 2 small steps fwd, 2-3 sidesteps towards HOB. Assist required for RW management     Posture / Balance Balance Overall balance assessment: Needs assistance Sitting-balance support: Feet supported, No upper extremity supported Sitting balance-Leahy Scale: Fair Standing balance support: Bilateral upper extremity supported, During functional activity Standing balance-Leahy Scale: Poor Standing balance comment: reliant on UE support     Special needs/care consideration Continuous Drip IV: no,   Skin: surgical incision to back   Diabetic management: yes    Previous Home Environment (from acute therapy documentation) Living Arrangements: Spouse/significant other Available Help at Discharge: Family, Available 24 hours/day Type of Home: House Home Layout: One level, Laundry or work area in basement Home Access: Stairs to enter Entrance Stairs-Rails: None Technical brewer of Steps: 1 Bathroom Shower/Tub: Chiropodist: Standard (3 in 1 over toilet) [place]: No   Discharge Living Setting Plans for Discharge Living Setting: House Type of Home at Discharge: House Discharge Home Layout: Two level, Able to live on main level with  bedroom/bathroom Alternate Level Stairs-Rails: None Alternate Level Stairs-Number of Steps: NA Discharge Home Access: Stairs to enter Entrance Stairs-Rails: None  Entrance Stairs-Number of Steps: 1 Discharge Bathroom Shower/Tub: Tub/shower unit Discharge Bathroom Toilet: Standard Discharge Bathroom Accessibility: Yes How Accessible: Accessible via walker Does the patient have any problems obtaining your medications?: No   Social/Family/Support Systems Patient Roles: Spouse Contact Information: wife: nancy [phone] Anticipated Caregiver: wife+ daughter + sister Anticipated Caregiver's Contact Information: see above for [name] Ability/Limitations of Caregiver: Min A Caregiver Availability: 24/7 Discharge Plan Discussed with Primary Caregiver: Yes Is Caregiver In Agreement with Plan?: Yes (with wife ) Does Caregiver/Family have Issues with Lodging/Transportation while Pt is in Rehab?: No   Goals Patient/Family Goal for Rehab: PT/OT: Min A; SLP: NA Expected length of stay: 12-16 days Pt/Family Agrees to Admission and willing to participate: Yes Program Orientation Provided & Reviewed with Pt/Caregiver Including Roles  & Responsibilities: Yes (with pt and his wife )  Barriers to Discharge: Home environment access/layout  Barriers to Discharge Comments: steps to enter   Decrease burden of Care through IP rehab admission: OtherNA   Possible need for SNF placement upon discharge: Not anticipated; pt has good social support at DC from family. Pt aware plan is to DC straight home with social support.    Patient Condition: I have reviewed medical records from [facility], spoken with MD, RN, and patient and spouse. I met with patient at the bedside for inpatient rehabilitation assessment.  Patient will benefit from ongoing PT and OT, can actively participate in 3 hours of therapy a day 5 days of the week, and can make measurable gains during the admission.  Patient will  also benefit from the coordinated team approach during an Inpatient Acute Rehabilitation admission.  The patient will receive intensive therapy as well as Rehabilitation physician, nursing, social worker, and care management interventions.  Due to safety, skin/wound care, disease management, medication administration, pain management and patient education the patient requires 24 hour a day rehabilitation nursing.  The patient is currently Mod A +2 for transfers and Min A for 2 feet using RW with mobility and Min A to Total A for basic ADLs.  Discharge setting and therapy post discharge at home with home health is anticipated.  Patient has agreed to participate in the Acute Inpatient Rehabilitation Program and will admit 02/13/20.   Preadmission Screen Completed By:  Raechel Ache, 02/13/2020 11:28 AM ______________________________________________________________________   Discussed status with Dr. Dagoberto Ligas on 02/13/20 at 11:27AM and received approval for admission today.   Admission Coordinator:  Raechel Ache, OT, time 11:27AM/Date 02/13/20.    Assessment/Plan: Diagnosis: 1. Does the need for close, 24 hr/day Medical supervision in concert with the patient's rehab needs make it unreasonable for this patient to be served in a less intensive setting? Yes 2. Co-Morbidities requiring supervision/potential complications: HTN, CHF, ICD, COPD, CKD, DM, CAD s/p CABG 3. Due to bladder management, bowel management, safety, skin/wound care, disease management, medication administration, pain management and patient education, does the patient require 24 hr/day rehab nursing? Yes 4. Does the patient require coordinated care of a physician, rehab nurse, PT, OT, and SLP to address physical and functional deficits in the context of the above medical diagnosis(es)? Yes Addressing deficits in the following areas: balance, endurance, locomotion, strength, transferring, bowel/bladder control, bathing, dressing, feeding,  grooming and toileting 5. Can the patient actively participate in an intensive therapy program of at least 3 hrs of therapy 5 days a week? Yes 6. The potential for patient to make measurable gains while on inpatient rehab is good 7. Anticipated functional outcomes upon discharge from  inpatient rehab: min assist PT, min assist OT, n/a SLP 8. Estimated rehab length of stay to reach the above functional goals is: 12-16 days  9. Anticipated discharge destination: Home 10. Overall Rehab/Functional Prognosis: good     MD Signature:          Revision History                     Note Details  Jan Fireman, MD File Time 02/13/2020 11:52 AM  Author Type Physician Status Signed  Last Editor James Heys, MD Service Physical Medicine and [place] # [id number] Admit Date 02/13/2020

## 2020-02-13 NOTE — Plan of Care (Signed)
  Problem: SCI BOWEL ELIMINATION Goal: RH STG MANAGE BOWEL WITH ASSISTANCE Description: STG Manage Bowel with  moderate Assistance. Outcome: Not ProgressingLBM per report 11/7; offered dulcolax pt wants it in the morning

## 2020-02-14 ENCOUNTER — Inpatient Hospital Stay (HOSPITAL_COMMUNITY): Payer: Medicare Other | Admitting: Occupational Therapy

## 2020-02-14 ENCOUNTER — Inpatient Hospital Stay (HOSPITAL_COMMUNITY): Payer: Medicare Other

## 2020-02-14 DIAGNOSIS — M5416 Radiculopathy, lumbar region: Secondary | ICD-10-CM | POA: Diagnosis not present

## 2020-02-14 DIAGNOSIS — M7989 Other specified soft tissue disorders: Secondary | ICD-10-CM | POA: Diagnosis not present

## 2020-02-14 LAB — COMPREHENSIVE METABOLIC PANEL
ALT: 173 U/L — ABNORMAL HIGH (ref 0–44)
AST: 141 U/L — ABNORMAL HIGH (ref 15–41)
Albumin: 2.8 g/dL — ABNORMAL LOW (ref 3.5–5.0)
Alkaline Phosphatase: 60 U/L (ref 38–126)
Anion gap: 12 (ref 5–15)
BUN: 40 mg/dL — ABNORMAL HIGH (ref 8–23)
CO2: 26 mmol/L (ref 22–32)
Calcium: 8.7 mg/dL — ABNORMAL LOW (ref 8.9–10.3)
Chloride: 105 mmol/L (ref 98–111)
Creatinine, Ser: 1.59 mg/dL — ABNORMAL HIGH (ref 0.61–1.24)
GFR, Estimated: 47 mL/min — ABNORMAL LOW (ref >60)
Glucose, Bld: 129 mg/dL — ABNORMAL HIGH (ref 70–99)
Potassium: 3.3 mmol/L — ABNORMAL LOW (ref 3.5–5.1)
Sodium: 143 mmol/L (ref 135–145)
Total Bilirubin: 1 mg/dL (ref 0.3–1.2)
Total Protein: 5.6 g/dL — ABNORMAL LOW (ref 6.5–8.1)

## 2020-02-14 LAB — CBC WITH DIFFERENTIAL/PLATELET
Abs Immature Granulocytes: 0.05 10*3/uL (ref 0.00–0.07)
Basophils Absolute: 0 10*3/uL (ref 0.0–0.1)
Basophils Relative: 1 %
Eosinophils Absolute: 0.2 10*3/uL (ref 0.0–0.5)
Eosinophils Relative: 3 %
HCT: 30.2 % — ABNORMAL LOW (ref 39.0–52.0)
Hemoglobin: 9.7 g/dL — ABNORMAL LOW (ref 13.0–17.0)
Immature Granulocytes: 1 %
Lymphocytes Relative: 14 %
Lymphs Abs: 1 10*3/uL (ref 0.7–4.0)
MCH: 29.8 pg (ref 26.0–34.0)
MCHC: 32.1 g/dL (ref 30.0–36.0)
MCV: 92.9 fL (ref 80.0–100.0)
Monocytes Absolute: 0.7 10*3/uL (ref 0.1–1.0)
Monocytes Relative: 9 %
Neutro Abs: 5.3 10*3/uL (ref 1.7–7.7)
Neutrophils Relative %: 72 %
Platelets: 182 10*3/uL (ref 150–400)
RBC: 3.25 MIL/uL — ABNORMAL LOW (ref 4.22–5.81)
RDW: 14.1 % (ref 11.5–15.5)
WBC: 7.2 10*3/uL (ref 4.0–10.5)
nRBC: 0 % (ref 0.0–0.2)

## 2020-02-14 LAB — GLUCOSE, CAPILLARY
Glucose-Capillary: 113 mg/dL — ABNORMAL HIGH (ref 70–99)
Glucose-Capillary: 120 mg/dL — ABNORMAL HIGH (ref 70–99)
Glucose-Capillary: 154 mg/dL — ABNORMAL HIGH (ref 70–99)
Glucose-Capillary: 135 mg/dL — ABNORMAL HIGH (ref 70–99)

## 2020-02-14 MED ORDER — SORBITOL 70 % SOLN
45.0000 mL | Freq: Once | Status: AC
  Administered 2020-02-14: 45 mL via ORAL
  Filled 2020-02-14: qty 60

## 2020-02-14 MED ORDER — POTASSIUM CHLORIDE CRYS ER 20 MEQ PO TBCR
40.0000 meq | EXTENDED_RELEASE_TABLET | Freq: Two times a day (BID) | ORAL | Status: AC
  Administered 2020-02-14 – 2020-02-15 (×2): 40 meq via ORAL
  Filled 2020-02-14 (×2): qty 2

## 2020-02-14 NOTE — Progress Notes (Signed)
Inpatient Rehabilitation  Patient information reviewed and entered into eRehab system by Allena Pietila M. Clary Meeker, M.A., CCC/SLP, PPS Coordinator.  Information including medical coding, functional ability and quality indicators will be reviewed and updated through discharge.    

## 2020-02-14 NOTE — Progress Notes (Addendum)
[place] PHYSICAL MEDICINE & REHABILITATION PROGRESS NOTE   Subjective/Complaints:  Pt reports no BM yet- been multiple days- constipated. Pain controlled with meds.  Taking 2 mg Dilaudid usually.  But does "wear off quickly".   Working on breakfast.    ROS:  Pt denies SOB, abd pain, CP, N/V/C/D, and vision changes   Objective:   VAS [place] LOWER EXTREMITY VENOUS (DVT)  Result Date: 02/14/2020  Lower Venous DVT Study Indications: Swelling.  Risk Factors: Surgery 02-10-2020 Posterior lumbar fusion. Limitations: Patient seated in chair during exam, unable to relocate to bed. Comparison Study: No prior studies. Performing Technologist: Darlin Coco, RDMS  Examination Guidelines: A complete evaluation includes B-mode imaging, spectral Doppler, color Doppler, and power Doppler as needed of all accessible portions of each vessel. Bilateral testing is considered an integral part of a complete examination. Limited examinations for reoccurring indications may be performed as noted. The reflux portion of the exam is performed with the patient in reverse Trendelenburg.  +---------+---------------+---------+-----------+----------+--------------+ RIGHT    CompressibilityPhasicitySpontaneityPropertiesThrombus Aging +---------+---------------+---------+-----------+----------+--------------+ CFV      Full           Yes      Yes                                 +---------+---------------+---------+-----------+----------+--------------+ SFJ      Full                                                        +---------+---------------+---------+-----------+----------+--------------+ FV Prox  Full                                                        +---------+---------------+---------+-----------+----------+--------------+ FV Mid   Full                                                        +---------+---------------+---------+-----------+----------+--------------+ FV DistalFull                                                         +---------+---------------+---------+-----------+----------+--------------+ PFV      Full                                                        +---------+---------------+---------+-----------+----------+--------------+ POP      Full           Yes      Yes                                 +---------+---------------+---------+-----------+----------+--------------+ PTV  Full                                                        +---------+---------------+---------+-----------+----------+--------------+ PERO     Full                                                        +---------+---------------+---------+-----------+----------+--------------+   +---------+---------------+---------+-----------+----------+-------------------+ LEFT     CompressibilityPhasicitySpontaneityPropertiesThrombus Aging      +---------+---------------+---------+-----------+----------+-------------------+ CFV      Full           Yes      Yes                  Not well visualized                                                       due to patient                                                            position            +---------+---------------+---------+-----------+----------+-------------------+ SFJ      Full                                                             +---------+---------------+---------+-----------+----------+-------------------+ FV Prox  Full                                                             +---------+---------------+---------+-----------+----------+-------------------+ FV Mid   Full                                                             +---------+---------------+---------+-----------+----------+-------------------+ FV DistalFull                                                              +---------+---------------+---------+-----------+----------+-------------------+ PFV      Full                                                             +---------+---------------+---------+-----------+----------+-------------------+  POP      Full           Yes      Yes                                      +---------+---------------+---------+-----------+----------+-------------------+ PTV      Full                                                             +---------+---------------+---------+-----------+----------+-------------------+ PERO     Full                                                             +---------+---------------+---------+-----------+----------+-------------------+     Summary: RIGHT: - There is no evidence of deep vein thrombosis in the lower extremity.  - No cystic structure found in the popliteal fossa.  LEFT: - There is no evidence of deep vein thrombosis in the lower extremity.  - No cystic structure found in the popliteal fossa.  *See table(s) above for measurements and observations.    Preliminary    Recent Labs    02/13/20 0205 02/14/20 0343  WBC 8.3 7.2  HGB 9.8* 9.7*  HCT 29.8* 30.2*  PLT 160 182   Recent Labs    02/13/20 0205 02/14/20 0343  NA 141 143  K 3.3* 3.3*  CL 107 105  CO2 24 26  GLUCOSE 131* 129*  BUN 36* 40*  CREATININE 1.62* 1.59*  CALCIUM 8.7* 8.7*    Intake/Output Summary (Last 24 hours) at 02/14/2020 1833 Last data filed at 02/14/2020 1823 Gross per 24 hour  Intake 360 ml  Output 850 ml  Net -490 ml        Physical Exam: Vital Signs Blood pressure (!) 117/59, pulse (!) 59, temperature 98.2 F (36.8 C), resp. rate 16, height 5\' 8"  (1.727 m), weight 102 kg, SpO2 96 %.   Constitutional:  obese older male- sitting up in bed- eating breakfast, NAD    Head: bruise R cheek  Cardiovascular: borderline bradycardia- regular rhythm Pulmonary: CTA B/L- no W/R/R- good air movement Abdominal: soft,  distended; hypoactive BS; NT  Musculoskeletal:     Cervical back: Neck supple.     Comments: UEs- 4/5 in deltoids, biceps,triceps and 4+/5 in WE, grip and finger abd L>R hand/fingers swollen/stiff- per wife, chronic LEs- RLE- HF 4-/5, KE 4/5, DF 2/5, and PF 4/5 LLE- HF 4/5, KE 4+/5, DF 2/5, PF 4/5  Skin:    Comments: Back incision is dressed Also, has dressing on sacrum for prevention, but no skin breakdown seen Heels OK/buttocks OK IV L shoulder and R wrist- look OK  Neurological:     Comments: Ox3 Decreased sensation in stocking/glove distribution Also "normal" on LLE now; reduced on entire RLE.  Psychiatric: Comments: Appropriate, cordial    Assessment/Plan: 1. Functional deficits due to spinal stenosis, lumbar radiculopathy  per day of interdisciplinary therapy in a comprehensive inpatient rehab setting.  Physiatrist is providing close team supervision and 24 hour management of active medical problems  listed below.  Physiatrist and rehab team continue to assess barriers to discharge/monitor patient progress toward functional and medical goals  Care Tool:  Bathing    Body parts bathed by patient: Right arm, Left arm, Chest, Abdomen, Front perineal area, Face   Body parts bathed by helper: Buttocks, Right upper leg, Left upper leg, Right lower leg, Left lower leg     Bathing assist Assist Level: Maximal Assistance - Patient 24 - 49%     Upper Body Dressing/Undressing Upper body dressing   What is the patient wearing?: Pull over shirt    Upper body assist Assist Level: Minimal Assistance - Patient > 75%    Lower Body Dressing/Undressing Lower body dressing      What is the patient wearing?: Pants     Lower body assist Assist for lower body dressing: Total Assistance - Patient < 25%     Toileting Toileting    Toileting assist       Transfers Chair/bed transfer  Transfers assist     Chair/bed transfer assist level: Moderate Assistance - Patient 50 -  74%     Locomotion Ambulation   Ambulation assist      Assist level: Moderate Assistance - Patient 50 - 74% Assistive device: Walker-rolling Max distance: 8   Walk 10 feet activity   Assist  Walk 10 feet activity did not occur: Safety/medical concerns        Walk 50 feet activity   Assist Walk 50 feet with 2 turns activity did not occur: Safety/medical concerns         Walk 150 feet activity   Assist Walk 150 feet activity did not occur: Safety/medical concerns         Walk 10 feet on uneven surface  activity   Assist Walk 10 feet on uneven surfaces activity did not occur: Safety/medical concerns         Wheelchair     Assist Will patient use wheelchair at discharge?: No Type of Wheelchair: Manual    Wheelchair assist level: Supervision/Verbal cueing Max wheelchair distance: 30'    Wheelchair 50 feet with 2 turns activity    Assist    Wheelchair 50 feet with 2 turns activity did not occur: Safety/medical concerns       Wheelchair 150 feet activity     Assist  Wheelchair 150 feet activity did not occur: Safety/medical concerns       Blood pressure (!) 117/59, pulse (!) 59, temperature 98.2 F (36.8 C), resp. rate 16, height 5\' 8"  (1.727 m), weight 102 kg, SpO2 96 %.  Medical Problem List and Plan: 1.  Decreased functional mobility secondary to L3-4 4-5 spondylolisthesis arthropathy with radiculopathy/spinal stenosis.  Status post bilateral L3-4 4-5 laminotomy foraminotomies decompression interbody fusion 02/10/2020.  Back corset when out of bed             -patient may  Shower if covers back incision             -ELOS/Goals: 12- 16 days- min A to supervision 2.  Antithrombotics: -DVT/anticoagulation: SCDs.  Check vascular study -will check with [facility] when can add Lovenox             -antiplatelet therapy: Resume Plavix 75 mg daily postop day #5 3. Pain Management: Flexeril as needed, Dilaudid 2-4 mg every 4 hours as needed  pain 4. Mood: Provide emotional support             -antipsychotic agents: N/A 5. Neuropsych: This patient  is capable of making decisions on his own behalf. 6. Skin/Wound Care: Routine skin checks 7. Fluids/Electrolytes/Nutrition: Routine in and outs with with follow-up chemistries 8.  Acute on chronic anemia.  Follow-up CBC 9.  CAD with CABG/ICD.  Amiodarone 200 mg twice daily, Imdur 30 mg daily, Toprol 25 mg daily 10  COPD.  Check oxygen saturations every shift  11/12- O2 sats doing well- con't regimen 11.  CKD stage III with history of right nephrectomy. Has single L kidney  Baseline creatinine 1.47-1.50 12.  Diabetes mellitus.  Hemoglobin A1c 5.9.  Currently on Tradjenta 5 mg daily.  Patient on Levemir 10 units daily prior to admission.  Resume as needed  11/12- BGs all less than 150- decently controlled- con't regimen 13.  Chronic systolic congestive heart failure.  Entresto 49-51 mg twice daily, Lasix 40 mg twice daily.  Monitor for any signs of fluid overload. 14.  Hypertension.  Monitor with increased mobility 15.  Hyperlipidemia.  Lipitor 16.  Hypothyroidism.  Synthroid 17.  BPH.  Flomax 0.4 mg daily.  Check PVR- was doing in/put caths at home until 3 weeks ago- might need to resume 18. Insomnia- takes melatonin 6 mg QHS_ will resume 19. Hypokalemia  11/12- will replete and recheck Monday 20. Constipation  11/12- give sorbitol dose- 45G- and monitor    LOS: 1 days A FACE TO FACE EVALUATION WAS PERFORMED  Shanise Balch 02/14/2020, 6:33 PM

## 2020-02-14 NOTE — Progress Notes (Signed)
Initial Nutrition Assessment  DOCUMENTATION CODES:   Obesity unspecified  INTERVENTION:   - Ensure Enlive po BID, each supplement provides 350 kcal and 20 grams of protein  - Encourage adequate PO intake  NUTRITION DIAGNOSIS:   Increased nutrient needs related to post-op healing, chronic illness (COPD, CHF) as evidenced by estimated needs.  GOAL:   Patient will meet greater than or equal to 90% of their needs  MONITOR:   PO intake, Supplement acceptance, Labs, Weight trends, Skin, I & O's  REASON FOR ASSESSMENT:   Malnutrition Screening Tool    ASSESSMENT:   69 year old male with PMH of CKD stage III, CAD, NSTEMI, s/p CABG/ICD in 2015, CHF, right nephrectomy 2019, COPD, DM, HTN, HLD. Presented 02/10/20 with progressive low back pain. MRI demonstrated L3-4 and L4-5 spinal listhesis facet arthropathy spinal stenosis with radiculopathy. Pt underwent bilateral L3-4 and 4-5 laminotomy foraminotomy to decompress bilateral L3-4-5 nerve roots with posterior lumbar interbody fusion as well as transforaminal lumbar interbody fusion on 02/10/20. Admitted to CIR on 11/11.   Spoke with pt and wife at bedside. Pt reports that his appetite is decreased compared to normal. He states that he thinks he isn't as hungry because he is dealing with pain. Pt reports appetite started to decrease about 2 months PTA when pain began to increase.  Pt reports that he did eat well at breakfast now. Meal completion at breakfast today charted as 80%. Pt amenable to receiving oral nutrition supplements during admission.  Pt endorses some weight loss and reports a UBW as 226-230 lbs. Reviewed weight history in chart. Pt with a weight loss of 2.3 kg since 01/13/20. This is a 2.2% weight loss in 1 month which is not significant for timeframe. RD will continue to monitor weights during admission to CIR.  Meal Completion: 80% x 2 meals  Medications reviewed and include: colace, Ensure Enlive BID, lasix, SSI,  tradjenta, senna, vitamin D weekly  Labs reviewed: potassium 3.3, BUN 40, creatinine 1.59, elevated LFTs, hemoglobin 9.7 CBGs: 105-120 x 24 hours  NUTRITION - FOCUSED PHYSICAL EXAM:    Most Recent Value  Orbital Region No depletion  Upper Arm Region No depletion  Thoracic and Lumbar Region No depletion  Buccal Region No depletion  Temple Region No depletion  Clavicle Bone Region No depletion  Clavicle and Acromion Bone Region No depletion  Scapular Bone Region No depletion  Dorsal Hand No depletion  Patellar Region No depletion  Anterior Thigh Region No depletion  Posterior Calf Region No depletion  Edema (RD Assessment) Mild  [BUE, BLE]  Hair Reviewed  Eyes Reviewed  Mouth Reviewed  Skin Reviewed  Nails Reviewed       Diet Order:   Diet Order            Diet Carb Modified Fluid consistency: Thin; Room service appropriate? Yes  Diet effective now                 EDUCATION NEEDS:   Education needs have been addressed  Skin:  Skin Assessment: Skin Integrity Issues: Incisions: back  Last BM:  02/09/20  Height:   Ht Readings from Last 1 Encounters:  02/13/20 5\' 8"  (1.727 m)    Weight:   Wt Readings from Last 1 Encounters:  02/13/20 102 kg    BMI:  Body mass index is 34.19 kg/m.  Estimated Nutritional Needs:   Kcal:  1900-2100  Protein:  95-110 grams  Fluid:  2.0 L/day    Gaynell Face, MS,  RD, LDN Inpatient Clinical Dietitian Please see [facility] for contact information.

## 2020-02-14 NOTE — Progress Notes (Signed)
Lower extremity venous bilateral study completed.   Please see CV Proc for preliminary results.   Indigo Chaddock, RDMS  

## 2020-02-14 NOTE — Evaluation (Signed)
Physical Therapy Assessment and Plan  Patient Details  Name: James [name]. MRN: [id number] Date of Birth: 12-28-1950  PT Diagnosis: Abnormal posture, Abnormality of gait, Low back pain and Muscle weakness Rehab Potential: Good ELOS: 2-2.5 weeks   Today's Date: 02/14/2020 PT Individual Time: 1105-1203 PT Individual Time Calculation (min): 58 min    Hospital Problem: Principal Problem:   Lumbar radiculopathy   Past Medical History:  Past Medical History:  Diagnosis Date  . Abnormal nuclear stress test 11/03/2017   Added automatically from request for surgery [id number]  . Benign hypertension with chronic kidney disease, stage III ([place]) 04/11/2018  . CAD in native artery 08/27/2014  . Chronic systolic congestive heart failure ([place]) 10/26/2017  . CKD stage 3 due to type 1 diabetes mellitus ([place]) 08/28/2014   Overview:  Cr 1.3 at discharge 08/21/14  . COPD GOLD II 10/17/2014   Followed in Pulmonary clinic/ [facility]/ [facility]  - PFTs   10/17/2014 FEV1  2.09 ( 53%) ratio 62 no sign better p B2 and dlco 63% and corrects to 85%  - 10/17/2014 p extensive coaching HFA effectiveness =  90%    > try stiolto    . DM (diabetes mellitus) ([place])   . Dyspnea 09/05/2014   Followed in Pulmonary clinic/ [facility]/ [facility] - 09/05/2014  Walked RA x 3 laps @ 185 ft each stopped due to end of study, nl pace, no desat   - pfts 10/17/14 dlco 63% on Amiodarone > may benefit from 6 month f/u but defer to Cards    . Essential hypertension 09/11/2014   Changed acei to ARB  09/05/14 due to pseudowheeze> improved 10/17/14    . Hyperlipidemia   . Hypertension   . Hypertensive heart disease with congestive heart failure ([place]) 08/27/2014  . ICD (implantable cardioverter-defibrillator) in place 08/27/2014  . Metabolic bone disease 32/99/2426  . NSTEMI (non-ST elevated myocardial infarction) ([place]) 02/12/2014  . Obesity 8/34/1962   Complicated by dm/ hbp and low erv on pfts 10/17/14    . On amiodarone therapy 08/27/2014  .  Renal oncocytoma of right kidney 01/29/2018  . S/P ICD (internal cardiac defibrillator) procedure 02/15/2014   followed by [facility] EP  . Typical atrial flutter ([place]) 08/27/2014  . Ventricular tachyarrhythmia ([place]) 08/27/2014  . Vitamin D deficiency 01/17/2018   Past Surgical History:  Past Surgical History:  Procedure Laterality Date  . CORONARY ARTERY BYPASS GRAFT  2008  . EYE SURGERY    . ICD IMPLANT    . MOHS SURGERY     Right Arm  . NEPHRECTOMY Right     Assessment & Plan Clinical Impression:Lige C. Rahmir, [place]. is a 69 year old right-handed male with history of CKD with creatinine 1.47, CAD/non-STEMI/CABG /ICD 2015 maintained on amiodarone as well as Plavix followed by cardiology services Dr. Bettina Gavia in [facility], chronic systolic congestive heart failure maintained on Entresto, CKD stage III as well as right nephrectomy 2019, COPD, diabetes mellitus, hypertension, hyperlipidemia.  Per chart review patient lives with spouse.  1 level home one-step to entry.  Used a Rollator prior to admission.  Wife does assist with some ADLs.  Presented 02/10/2020 with progressive low back pain radiating to lower extremities right greater than left.  MRI demonstrated L3-4 and L4-5 spinal listhesis facet arthropathy spinal stenosis with radiculopathy.  Patient underwent bilateral L3-4 and 4-5 laminotomy foraminotomy to decompress bilateral L3-4-5 nerve roots with posterior lumbar interbody fusion as well as transforaminal lumbar interbody fusion 02/10/2020 per Dr.  Jenkins.  Lumbar corset when out of bed.  Monitoring of renal function latest creatinine 1.62 from a baseline of 1.47-1.50.  Acute blood loss anemia 9.8 and monitored.  Plan to resume Plavix 5 days postop.  Therapy evaluations completed and patient was admitted for a comprehensive rehab program on 02/13/2020 .   Patient currently requires max with mobility secondary to muscle weakness and abnormal tone, decreased coordination and  decreased motor planning.  Prior to hospitalization, patient was supervision with mobility and lived with Spouse in a House home.  Home access is 1 stoop (6-8")Stairs to enter.  Patient will benefit from skilled PT intervention to maximize safe functional mobility, minimize fall risk and decrease caregiver burden for planned discharge home with 24 hour supervision.  Anticipate patient will benefit from follow up [place] at discharge.  PT - End of Session Activity Tolerance: Decreased this session;Tolerates 30+ min activity with multiple rests Endurance Deficit: Yes Endurance Deficit Description: requires frequent rest breaks PT Assessment Rehab Potential (ACUTE/IP ONLY): Good PT Patient demonstrates impairments in the following area(s): Balance;Edema;Endurance;Motor;Safety PT Transfers Functional Problem(s): Bed Mobility;Bed to Chair;Car;Furniture PT Locomotion Functional Problem(s): Ambulation;Stairs PT Plan PT Intensity: Minimum of 1-2 x/day ,45 to 90 minutes PT Frequency: 5 out of 7 days PT Duration Estimated Length of Stay: 2-2.5 weeks PT Treatment/Interventions: Ambulation/gait training;Balance/vestibular training;Functional mobility training;Patient/family education;Therapeutic Exercise;Therapeutic Activities;UE/LE Coordination activities;UE/LE Strength taining/ROM;Stair training;Wheelchair propulsion/positioning;DME/adaptive equipment instruction PT Transfers Anticipated Outcome(s): Supervision PT Locomotion Anticipated Outcome(s): Supervision w/ AD PT Recommendation Follow Up Recommendations: Home health PT Patient destination: Home Equipment Recommended: None recommended by PT   PT Evaluation Precautions/Restrictions Precautions Precautions: Back Required Braces or Orthoses: Spinal Brace Spinal Brace: Lumbar corset;Applied in sitting position Pain Pain Assessment Pain Score: 7  Pain Type: Surgical pain Pain Location: Back Pain Orientation: Mid Pain Descriptors / Indicators:  Stabbing Pain Onset: On-going Pain Intervention(s): RN made aware Home Living/Prior Functioning Home Living Living Arrangements: Spouse/significant other Available Help at Discharge: Family;Available PRN/intermittently (wife works 30 hours a week) Type of Home: House Home Access: Stairs to enter [employer] of Steps: 1 stoop (6-8") Entrance Stairs-Rails: None Home Layout: One level;Laundry or work area in [employer] Shower/Tub: Tub/shower unit Additional Comments: 3 in 1, suction grab bars in shower, Rollator  Lives With: Spouse Prior Function Level of Independence: Requires assistive device for independence;Needs assistance with ADLs (one fall backwards into the tub since home from last hosptialization and SNF stay) Vocation: Retired Comments: patient has been declining recently due to pain, unable to stand upright prior to surgery.  Vision/Perception  Vision - Assessment Additional Comments: reports blurring of vision Perception Perception: Within Functional Limits Praxis Praxis: Intact  Cognition Overall Cognitive Status: Within Functional Limits for tasks assessed Arousal/Alertness: Awake/alert Orientation Level: Oriented X4 Attention: Sustained;Selective Sustained Attention: Appears intact Selective Attention: Impaired Immediate Memory Recall: Sock;Blue;Bed Memory Recall Sock: Without Cue Memory Recall Blue: Without Cue Memory Recall Bed: Without Cue Awareness: Appears intact Problem Solving: Appears intact Safety/Judgment: Appears intact Sensation Sensation Light Touch: Impaired by gross assessment (neuropathy) Proprioception: Impaired by gross assessment (neuropathy) Coordination Gross Motor Movements are Fluid and Coordinated: No Fine Motor Movements are Fluid and Coordinated: No Coordination and Movement Description: slowed movements and somewhat rigid Motor  Motor Motor: Abnormal postural alignment and control Motor - Skilled Clinical  Observations: rounded shoulders, forward head, stiffness throughout spine   Trunk/Postural Assessment  Cervical Assessment Cervical Assessment: Exceptions to [facility] (forward head stiffness throughout) Thoracic Assessment Thoracic Assessment: Exceptions to [facility] (kyphosis and stiffness) Lumbar  Assessment Lumbar Assessment: Exceptions to [facility] (s/p lumbar surgery, stiffness, posterior tilt) Postural Control Postural Control: Deficits on evaluation Postural Limitations: rigidity noted and rounded shoulders, forward head  Balance Balance Balance Assessed: Yes Static Sitting Balance Static Sitting - Level of Assistance: 5: Stand by assistance Dynamic Sitting Balance Dynamic Sitting - Balance Support: Feet supported Dynamic Sitting - Level of Assistance: 5: Stand by assistance Static Standing Balance Static Standing - Balance Support: Right upper extremity supported Static Standing - Level of Assistance: 5: Stand by assistance Dynamic Standing Balance Dynamic Standing - Balance Support: Bilateral upper extremity supported Dynamic Standing - Level of Assistance: 4: Min assist;3: Mod assist  Extremity Assessment  RUE Assessment RUE Assessment: Within Functional Limits General Strength Comments: grossly 4/5 LUE Assessment LUE Assessment: Exceptions to [facility] Active Range of Motion (AROM) Comments: shoulder flexion ~80* due to arthritis in shoulder, elbow and distal WNL General Strength Comments: grossly 4/5   LLE Assessment LLE Assessment: Exceptions to [facility] Active Range of Motion (AROM) Comments: AROM grossly WFL General Strength Comments: Hip flexion 3/5, knee extension 4-/5, ankleDF 4-/5  Care Tool Care Tool Bed Mobility Roll left and right activity   Roll left and right assist level: Moderate Assistance - Patient 50 - 74%    Sit to lying activity        Lying to sitting edge of bed activity   Lying to sitting edge of bed assist level: Moderate Assistance - Patient 50 - 74%      Care Tool Transfers Sit to stand transfer   Sit to stand assist level: Maximal Assistance - Patient 25 - 49%    Chair/bed transfer   Chair/bed transfer assist level: Moderate Assistance - Patient 50 - 74%     Toilet transfer   Assist Level: Moderate Assistance - Patient 50 - 74%    Car transfer   Car transfer assist level: Maximal Assistance - Patient 25 - 49%      Care Tool Locomotion Ambulation          Walk 10 feet activity         Walk 50 feet with 2 turns activity        Walk 150 feet activity        Walk 10 feet on uneven surfaces activity        Stairs   Assist level: Maximal Assistance - Patient 25 - 49%      Walk up/down 1 step activity   Walk up/down 1 step (curb) assist level: Maximal Assistance - Patient 25 - 49%      Walk up/down 4 steps activity      Walk up/down 12 steps activity        Pick up small objects from floor        Wheelchair            Wheel 50 feet with 2 turns activity      Wheel 150 feet activity        Refer to Care Plan for Long Term Goals  SHORT TERM GOAL WEEK 1 PT Short Term Goal 1 (Week 1): Patient will consistently perform sit to stand transfers with S PT Short Term Goal 2 (Week 1): Patient will demonstrate standing tolerance for 3 minutes during functional task. PT Short Term Goal 3 (Week 1): Patient will ambulate 34' with min A LRAD PT Short Term Goal 4 (Week 1): Patient will perform bed mobility with min A at least 50% of the time  Recommendations  for other services: Therapeutic Recreation  Pet therapy and Stress management  Skilled Therapeutic Intervention  Patient in w/c up since OT and reports tired of sitting with back pain starting.  Seated in w/c able to propel x 30-40' with S using bilateral UE's, though reports significant arthritis in L shoulder and crepitus felt at times in session.  Patient sit to stand from w/c mod A for ambulation x 5' prior to needing to sit due to pain, RN made aware.   Patient transferred w/c to bed via stand step with RW and mod A for balance, walker management, cues for technique.  Patient seated with cues for doffing brace.  Sit to sidelying with mod A/max A for LE management, positioning once in supine.  Patient demonstrated rolling with rails and HOB slightly elevated with min A.  Patient positioned with HOB up for comfort.  Performed intermittent use of incentive spirometer due to coughing and feeling needs to expectorate.  Able to expectorate and felt better.  Wife entered room end of session and discussed prior falls (5 since just before March, only one since last hospitalization).  Discussed goals and wife reports plans to stay with pt initially and will have daughter to help when she cannot be there.  Patient left in bed with bed alarm active and needs in reach, wife in the room.  Mobility Bed Mobility Bed Mobility: Rolling Right;Right Sidelying to Sit Rolling Right: Moderate Assistance - Patient 50-74% Right Sidelying to Sit: Moderate Assistance - Patient 50-74% Transfers Transfers: Sit to Stand;Stand to Sit;Stand Pivot Transfers Sit to Stand: Moderate Assistance - Patient 50-74% Stand to Sit: Moderate Assistance - Patient 50-74% Stand Pivot Transfers: Moderate Assistance - Patient 50 - 74% Transfer (Assistive device): Rolling walker Locomotion  Gait Ambulation: Yes Gait Assistance: Moderate Assistance - Patient 50-74% Gait Distance (Feet): 8 Feet Assistive device: Rolling walker Gait Assistance Details: Verbal cues for technique;Verbal cues for safe use of DME/AE Gait Assistance Details: cues for posture, proximity/walker management Stairs / Additional Locomotion Stairs: Yes Stairs Assistance: Maximal Assistance - Patient 25 - 49% Stair Management Technique: With walker Number of Stairs: 1 Height of Stairs: 4 Ramp: Maximal Assistance - Patient 25 - 49% Curb: Maximal Assistance - Patient 25 - 49% Wheelchair Mobility Wheelchair Mobility:  Yes Wheelchair Assistance: Chartered loss adjuster: Both upper extremities Wheelchair Parts Management: Needs assistance Distance: 30'   Discharge Criteria: Patient will be discharged from PT if patient refuses treatment 3 consecutive times without medical reason, if treatment goals not met, if there is a change in medical status, if patient makes no progress towards goals or if patient is discharged from hospital.  The above assessment, treatment plan, treatment alternatives and goals were discussed and mutually agreed upon: by patient and by family  Jamison Oka, PT  02/14/2020, 12:59 PM

## 2020-02-14 NOTE — Progress Notes (Signed)
Physical Therapy Session Note  Patient Details  Name: James [name]. MRN: [id number] Date of Birth: 12-01-1950  Today's Date: 02/14/2020 PT Individual Time: 1607-3710 PT Individual Time Calculation (min): 54 min   Short Term Goals: Week 1:  PT Short Term Goal 1 (Week 1): Patient will consistently perform sit to stand transfers with S PT Short Term Goal 2 (Week 1): Patient will demonstrate standing tolerance for 3 minutes during functional task. PT Short Term Goal 3 (Week 1): Patient will ambulate 27' with min A LRAD PT Short Term Goal 4 (Week 1): Patient will perform bed mobility with min A at least 50% of the time  Skilled Therapeutic Interventions/Progress Updates:  Patient in supine, rolled to R and side to sit mod A for trunk and scooting to EOB.  Patient sit to stand mod A to RW and stand pivot to w/c mod A increased time and cues for stepping, hand placement.  Patient assisted in w/c to ortho gym.  Performed car transfer to simulated mid SUV size with mod/max A for LE management and scooting with cues.  Patient negotiated up ramp only with mod A as fatigued at top and had w/c follow for safety.  Patient assisted to switch w/c due to pulls to R.  Propelled about 35' with improved ease, but reports arms fatigued.  Pushed in w/c to dayroom to attempt curb negotiation to 4" height curb with demonstration.  Patient assisted to place RW onto curb and leading with L able to step up, but could not turn or step down backwards as fatigued and anxious so assisted to sit on mat next to step.  Stand step to w/c mod A and assisted to room and stand step to bed with RW and mod A.  Patient left supine with needs in reach and bed alarm activated.  Therapy Documentation Precautions:  Precautions Precautions: Back Required Braces or Orthoses: Spinal Brace Spinal Brace: Lumbar corset, Applied in sitting position Restrictions Weight Bearing Restrictions: No    Therapy/Group: Individual  Therapy  Reginia Naas  [place], [place] 02/14/2020, 6:08 PM

## 2020-02-14 NOTE — Progress Notes (Signed)
Patient Details  Name: James [name]. MRN: [id number] Date of Birth: 1950-05-09  Today's Date: 02/14/2020  Hospital Problems: Principal Problem:   Lumbar radiculopathy  Past Medical History:  Past Medical History:  Diagnosis Date  . Abnormal nuclear stress test 11/03/2017   Added automatically from request for surgery [id number]  . Benign hypertension with chronic kidney disease, stage III ([place]) 04/11/2018  . CAD in native artery 08/27/2014  . Chronic systolic congestive heart failure ([place]) 10/26/2017  . CKD stage 3 due to type 1 diabetes mellitus ([place]) 08/28/2014   Overview:  Cr 1.3 at discharge 08/21/14  . COPD GOLD II 10/17/2014   Followed in Pulmonary clinic/ [facility]/ [facility]  - PFTs   10/17/2014 FEV1  2.09 ( 53%) ratio 62 no sign better p B2 and dlco 63% and corrects to 85%  - 10/17/2014 p extensive coaching HFA effectiveness =  90%    > try stiolto    . DM (diabetes mellitus) ([place])   . Dyspnea 09/05/2014   Followed in Pulmonary clinic/ [facility]/ [facility] - 09/05/2014  Walked RA x 3 laps @ 185 ft each stopped due to end of study, nl pace, no desat   - pfts 10/17/14 dlco 63% on Amiodarone > may benefit from 6 month f/u but defer to Cards    . Essential hypertension 09/11/2014   Changed acei to ARB  09/05/14 due to pseudowheeze> improved 10/17/14    . Hyperlipidemia   . Hypertension   . Hypertensive heart disease with congestive heart failure ([place]) 08/27/2014  . ICD (implantable cardioverter-defibrillator) in place 08/27/2014  . Metabolic bone disease 21/30/8657  . NSTEMI (non-ST elevated myocardial infarction) ([place]) 02/12/2014  . Obesity 8/46/9629   Complicated by dm/ hbp and low erv on pfts 10/17/14    . On amiodarone therapy 08/27/2014  . Renal oncocytoma of right kidney 01/29/2018  . S/P ICD (internal cardiac defibrillator) procedure 02/15/2014   followed by [facility] EP  . Typical atrial flutter ([place]) 08/27/2014  . Ventricular tachyarrhythmia ([place]) 08/27/2014  . Vitamin D  deficiency 01/17/2018   Past Surgical History:  Past Surgical History:  Procedure Laterality Date  . CORONARY ARTERY BYPASS GRAFT  2008  . EYE SURGERY    . ICD IMPLANT    . MOHS SURGERY     Right Arm  . NEPHRECTOMY Right    Social History:  reports that he quit smoking about 13 years ago. His smoking use included cigarettes. He has a 38.00 pack-year smoking history. He has never used smokeless tobacco. He reports that he does not drink alcohol and does not use drugs.  Family / Support Systems Marital Status: Married Patient Roles: Spouse, Parent Spouse/Significant Other: [name] [phone]-cell Children: has three surviving children-one local, one in [place] and one in [place] Other Supports: Sister is supportive Anticipated Caregiver: Wife, daughter and sister Ability/Limitations of Caregiver: Can do some physical assist-min level. Wife was assisting him prior to admission for the surgery Caregiver Availability: 24/7 Family Dynamics: Close knit family who are involved and have been assisting him prior to his back surgery. He wants to be as independent as he can be at least walking with a walker like he was PTA  Social History Preferred language: English Religion: None Cultural Background: No issues Education: HS Read: Yes Write: Yes Employment Status: Retired Public relations account executive Issues: No issues Guardian/Conservator: None-according to MD pt is capable of making his own decisions while here.   Abuse/Neglect Abuse/Neglect Assessment Can Be Completed:  Yes Physical Abuse: Denies Verbal Abuse: Denies Sexual Abuse: Denies Exploitation of patient/patient's resources: Denies Self-Neglect: Denies  Emotional Status Pt's affect, behavior and adjustment status: Pt is motivated to do well here and glad to be here instead of a SNF. He has been to [facility] and has not plans on going back. He hopes to be mobile with his walker by discharge. Recent Psychosocial Issues: other health  issues-kidney issues from loss of one of his kidneys after non-malignant tumor found-nees to be careful regarding medications he is on Psychiatric History: No history deferred depression screen seems to be coping appropriately and able to express himself regarding his feelings. Will ask neuro-psych to see while here for support Substance Abuse History: No issues  Patient / Family Perceptions, Expectations & Goals Pt/Family understanding of illness & functional limitations: Pt and wife can explain his surgery and limitations regarding this. Both talk with the MD daily and feel they have a good understanding of his treatment plan going forward. Premorbid pt/family roles/activities: Husband, father, grandfather, retiree, church member, Health visitor, etc Anticipated changes in roles/activities/participation: resume Pt/family expectations/goals: Pt states: " I want to do as much as I can for myself, my wife is a great caregiver, but I want to do on my own."  Wife states: " I will do what he needs me too."  [employer]: None Premorbid Home Care/DME Agencies: Other (Comment) (has rollator, rw and bsc) Transportation available at discharge: Wife Resource referrals recommended: Neuropsychology  Discharge Planning Living Arrangements: Spouse/significant other Support Systems: Spouse/significant other, Children, Other relatives, Friends/neighbors, Social worker community Type of Residence: Private residence Insurance Resources: [employer], Multimedia programmer (specify) Nurse, mental health) Financial Resources: [place], Family Support Financial Screen Referred: No Living Expenses: Own Money Management: Patient, Spouse Does the patient have any problems obtaining your medications?: No Home Management: Wife does the home management Patient/Family Preliminary Plans: Retunr home with wife mainly assisting and daughter helping out. Pt was I & O cath himself-wife helped three weeks prior to  admission. Wife has been assisting him prior to admission so the more able he is to do for himself will help her. Care Coordinator Anticipated Follow Up Needs: HH/OP  Clinical Impression Pleasant gentleman who is glad to be here and is ready to work, he is having pain issues but will try to push through. He knows the MD is working on this. Very involved wife who was assisting prior to admission, will be here daily. His children will also assist when able. Await therapy evaluations and work on safe discharge plan. Will place on neuro-psych list for support.  Elease Hashimoto 02/14/2020, 10:34 AM

## 2020-02-14 NOTE — Progress Notes (Signed)
[facility] Individual Statement of Services  Patient Name:  James [name].  Date:  02/14/2020  Welcome to the [facility].  Our goal is to provide you with an individualized program based on your diagnosis and situation, designed to meet your specific needs.  With this comprehensive rehabilitation program, you will be expected to participate in at least 3 hours of rehabilitation therapies Monday-Friday, with modified therapy programming on the weekends.  Your rehabilitation program will include the following services:  Physical Therapy (PT), Occupational Therapy (OT), 24 hour per day rehabilitation nursing, Neuropsychology, Care Coordinator, Rehabilitation Medicine, Nutrition Services and Pharmacy Services  Weekly team conferences will be held on Tuesday to discuss your progress.  Your Inpatient Rehabilitation Care Coordinator will talk with you frequently to get your input and to update you on team discussions.  Team conferences with you and your family in attendance may also be held.  Expected length of stay: 14-16 Days  Overall anticipated outcome: CGA  Depending on your progress and recovery, your program may change. Your Inpatient Rehabilitation Care Coordinator will coordinate services and will keep you informed of any changes. Your Inpatient Rehabilitation Care Coordinator's name and contact numbers are listed  below.  The following services may also be recommended but are not provided by the [facility]:    [place] will be made to provide these services after discharge if needed.  Arrangements include referral to agencies that provide these services.  Your insurance has been verified to be:  Medicare & [place] Your primary doctor is:  Vassie Loll Eyk  Pertinent information will be shared with your doctor and your insurance company.  Inpatient  Rehabilitation Care Coordinator:  Ovidio Kin, [place] or Emilia Beck  Information discussed with and copy given to patient by: Elease Hashimoto, 02/14/2020, 10:36 AM

## 2020-02-14 NOTE — Progress Notes (Signed)
Pt's LBM was 02/09/20, he was given scheduled colace tonight and was offered a suppository. Pt stated "I want to wait and do that tomorrow." Therefore I will offer it in the AM. Also pt had concerns regarding taking potassium and if it will hurt him due to only having one kidney. I called Dr. Dagoberto Ligas to confirm if it was okay for the pt to take and she did confirm it was okay b/c pt needs his potassium levels to come back up to normal range and this was explained to the pt. Pt did agree to take it. No other concerns to note at this time.   Bertram Millard LPN

## 2020-02-14 NOTE — Evaluation (Signed)
Occupational Therapy Assessment and Plan  Patient Details  Name: James [name]. MRN: [id number] Date of Birth: Apr 23, 1950  OT Diagnosis: acute pain, lumbago (low back pain), muscle weakness (generalized) and pain in joint Rehab Potential: Rehab Potential (ACUTE ONLY): Good ELOS: 14-16 days   Today's Date: 02/14/2020 OT Individual Time: 4098-1191 OT Individual Time Calculation (min): 60 min     Hospital Problem: Principal Problem:   Lumbar radiculopathy   Past Medical History:  Past Medical History:  Diagnosis Date  . Abnormal nuclear stress test 11/03/2017   Added automatically from request for surgery [phone]  . Benign hypertension with chronic kidney disease, stage III ([place]) 04/11/2018  . CAD in native artery 08/27/2014  . Chronic systolic congestive heart failure ([place]) 10/26/2017  . CKD stage 3 due to type 1 diabetes mellitus ([place]) 08/28/2014   Overview:  Cr 1.3 at discharge 08/21/14  . COPD GOLD II 10/17/2014   Followed in Pulmonary clinic/ [facility]/ [facility]  - PFTs   10/17/2014 FEV1  2.09 ( 53%) ratio 62 no sign better p B2 and dlco 63% and corrects to 85%  - 10/17/2014 p extensive coaching HFA effectiveness =  90%    > try stiolto    . DM (diabetes mellitus) ([place])   . Dyspnea 09/05/2014   Followed in Pulmonary clinic/ [facility]/ [facility] - 09/05/2014  Walked RA x 3 laps @ 185 ft each stopped due to end of study, nl pace, no desat   - pfts 10/17/14 dlco 63% on Amiodarone > may benefit from 6 month f/u but defer to Cards    . Essential hypertension 09/11/2014   Changed acei to ARB  09/05/14 due to pseudowheeze> improved 10/17/14    . Hyperlipidemia   . Hypertension   . Hypertensive heart disease with congestive heart failure ([place]) 08/27/2014  . ICD (implantable cardioverter-defibrillator) in place 08/27/2014  . Metabolic bone disease 62/13/0865  . NSTEMI (non-ST elevated myocardial infarction) ([place]) 02/12/2014  . Obesity 7/84/6962   Complicated by dm/ hbp and low erv on pfts  10/17/14    . On amiodarone therapy 08/27/2014  . Renal oncocytoma of right kidney 01/29/2018  . S/P ICD (internal cardiac defibrillator) procedure 02/15/2014   followed by [facility] EP  . Typical atrial flutter ([place]) 08/27/2014  . Ventricular tachyarrhythmia ([place]) 08/27/2014  . Vitamin D deficiency 01/17/2018   Past Surgical History:  Past Surgical History:  Procedure Laterality Date  . CORONARY ARTERY BYPASS GRAFT  2008  . EYE SURGERY    . ICD IMPLANT    . MOHS SURGERY     Right Arm  . NEPHRECTOMY Right     Assessment & Plan Clinical Impression: Patient is a 69 y.o. right-handed male with history of CKD with creatinine 1.47, CAD/non-STEMI/CABG /ICD 2015 maintained on amiodarone as well as Plavix followed by cardiology services Dr. Bettina Gavia in [facility], chronic systolic congestive heart failure maintained on Entresto, CKD stage III as well as right nephrectomy 2019, COPD, diabetes mellitus, hypertension, hyperlipidemia.  Per chart review patient lives with spouse.  1 level home one-step to entry.  Used a Rollator prior to admission.  Wife does assist with some ADLs.  Presented 02/10/2020 with progressive low back pain radiating to lower extremities right greater than left.  MRI demonstrated L3-4 and L4-5 spinal listhesis facet arthropathy spinal stenosis with radiculopathy.  Patient underwent bilateral L3-4 and 4-5 laminotomy foraminotomy to decompress bilateral L3-4-5 nerve roots with posterior lumbar interbody fusion as well as transforaminal  lumbar interbody fusion 02/10/2020 per Dr. Arnoldo Morale.  Lumbar corset when out of bed.  Monitoring of renal function latest creatinine 1.62 from a baseline of 1.47-1.50.  Acute blood loss anemia 9.8 and monitored.  Plan to resume Plavix 5 days postop.  Therapy evaluations completed and patient was admitted for a comprehensive rehab program.  Patient transferred to CIR on 02/13/2020 .    Patient currently requires mod-total assist with basic  self-care skills secondary to muscle weakness, decreased cardiorespiratoy endurance, unbalanced muscle activation and difficulty maintaining precautions and pain.  Prior to hospitalization, patient could complete ADLs with increased assistance since 69/2021.  Patient will benefit from skilled intervention to decrease level of assist with basic self-care skills and increase independence with basic self-care skills prior to discharge home with care partner.  Anticipate patient will require 24 hour supervision and follow up home health.  OT - End of Session Activity Tolerance: Tolerates 30+ min activity with multiple rests Endurance Deficit: Yes Endurance Deficit Description: requires frequent rest breaks OT Assessment Rehab Potential (ACUTE ONLY): Good OT Barriers to Discharge: Decreased caregiver support OT Patient demonstrates impairments in the following area(s): Balance;Endurance;Motor;Pain;Safety OT Basic ADL's Functional Problem(s): Grooming;Bathing;Dressing;Toileting OT Transfers Functional Problem(s): Toilet;Tub/Shower OT Additional Impairment(s): None OT Plan OT Intensity: Minimum of 1-2 x/day, 45 to 90 minutes OT Frequency: 5 out of 7 days OT Duration/Estimated Length of Stay: 10-14 days OT Treatment/Interventions: Balance/vestibular training;Discharge planning;Disease Lawyer;Functional mobility training;Pain management;Patient/family education;Psychosocial support;Self Care/advanced ADL retraining;Splinting/orthotics;Skin care/wound managment;Therapeutic Activities;UE/LE Strength taining/ROM;Therapeutic Exercise OT Basic Self-Care Anticipated Outcome(s): Supervision - Min A OT Toileting Anticipated Outcome(s): Supervision OT Bathroom Transfers Anticipated Outcome(s): Supervision OT Recommendation Patient destination: Home Follow Up Recommendations: Home health OT Equipment Recommended: Tub/shower bench   OT  Evaluation Precautions/Restrictions  Precautions Precautions: Back Required Braces or Orthoses: Spinal Brace Spinal Brace: Lumbar corset;Applied in sitting position Pain Pain Assessment Pain Scale: 0-10 Pain Score: 8 Pain Type: Surgical pain Pain Location: Back Pain Orientation: Mid Pain Descriptors / Indicators: Stabbing Pain Onset: On-going Pain Intervention(s): medicated at beginning of session Home Living/Prior [place] expects to be discharged to:: Private residence Living Arrangements: Spouse/significant other Available Help at Discharge: Family, Available PRN/intermittently (wife works 30 hrs/week) Type of Home: House Home Access: Stairs to enter Technical brewer of Steps: 1 stoop (6-8") Entrance Stairs-Rails: None Home Layout: One level, Laundry or work area in basement [employer] Shower/Tub: Chiropodist: Standard Additional Comments: 3 in 1, suction grab bars in shower, Rollator  Lives With: Spouse IADL History Homemaking Responsibilities: No Prior Function Level of Independence: Requires assistive device for independence, Needs assistance with ADLs Bath: Maximal Toileting: Moderate Dressing: Maximal  Able to Take Stairs?: No Driving: No (driving up until 05/4266) Vocation: Retired Comments: patient has been declining recently due to pain, unable to stand upright prior to surgery.  Vision Baseline Vision/History: Wears glasses Wears Glasses: Reading only Patient Visual Report: Blurring of vision;No change from baseline Vision Assessment?: No apparent visual deficits Additional Comments: reports blurring of vision Perception  Perception: Within Functional Limits Praxis Praxis: Intact Cognition Overall Cognitive Status: Within Functional Limits for tasks assessed Arousal/Alertness: Awake/alert Orientation Level: Person;Place;Situation Person: Oriented Place: Oriented Situation: Oriented Year:  2021 Month: November Day of Week: Correct Immediate Memory Recall: Sock;Blue;Bed Memory Recall Sock: Without Cue Memory Recall Blue: Without Cue Memory Recall Bed: Without Cue Attention: Sustained;Selective Sustained Attention: Appears intact Selective Attention: Impaired Awareness: Appears intact Problem Solving: Appears intact Safety/Judgment: Appears intact Sensation Sensation Light Touch: Impaired by  gross assessment (neuropathy) Proprioception: Impaired by gross assessment (neuropathy) Coordination Gross Motor Movements are Fluid and Coordinated: No Fine Motor Movements are Fluid and Coordinated: No Coordination and Movement Description: slowed movements and somewhat rigid Motor  Motor Motor: Abnormal postural alignment and control Motor - Skilled Clinical Observations: rounded shoulders, forward head, stiffness throughout spine  Trunk/Postural Assessment     Balance   Extremity/Trunk Assessment RUE Assessment RUE Assessment: Within Functional Limits General Strength Comments: grossly 4/5 LUE Assessment LUE Assessment: Exceptions to [facility] Active Range of Motion (AROM) Comments: shoulder flexion ~80* due to arthritis in shoulder, elbow and distal WNL General Strength Comments: grossly 4/5  Care Tool Care Tool Self Care Eating        Oral Care         Bathing   Body parts bathed by patient: Right arm;Left arm;Chest;Abdomen;Front perineal area;Face Body parts bathed by helper: Buttocks;Right upper leg;Left upper leg;Right lower leg;Left lower leg   Assist Level: Maximal Assistance - Patient 24 - 49%    Upper Body Dressing(including orthotics)   What is the patient wearing?: Pull over shirt   Assist Level: Minimal Assistance - Patient > 75%    Lower Body Dressing (excluding footwear)   What is the patient wearing?: Pants Assist for lower body dressing: Total Assistance - Patient < 25%    Putting on/Taking off footwear   What is the patient wearing?:  Non-skid slipper socks Assist for footwear: Total Assistance - Patient < 25%       Care Tool Toileting Toileting activity         Care Tool Bed Mobility Roll left and right activity   Roll left and right assist level: Moderate Assistance - Patient 50 - 74%    Sit to lying activity        Lying to sitting edge of bed activity   Lying to sitting edge of bed assist level: Moderate Assistance - Patient 50 - 74%     Care Tool Transfers Sit to stand transfer   Sit to stand assist level: Maximal Assistance - Patient 25 - 49%    Chair/bed transfer   Chair/bed transfer assist level: Moderate Assistance - Patient 50 - 74%     Toilet transfer   Assist Level: Moderate Assistance - Patient 50 - 74%     Care Tool Cognition Expression of Ideas and Wants Expression of Ideas and Wants: Without difficulty (complex and basic) - expresses complex messages without difficulty and with speech that is clear and easy to understand   Understanding Verbal and Non-Verbal Content Understanding Verbal and Non-Verbal Content: Understands (complex and basic) - clear comprehension without cues or repetitions   Memory/Recall Ability *first 3 days only Memory/Recall Ability *first 3 days only: Current season;Location of own room;Staff names and faces;That he or she is in a hospital/hospital unit    Refer to Care Plan for [place] 1 OT Short Term Goal 1 (Week 1): Pt will complete LB dressing with use of AE with mod assist OT Short Term Goal 2 (Week 1): Pt will complete toilet transfer with Min assist OT Short Term Goal 3 (Week 1): Pt will complete bathing with mod assist wiht use of AE OT Short Term Goal 4 (Week 1): Pt will complete toileting with mod assist  Recommendations for other services: None    Skilled Therapeutic Intervention OT eval completed with discussion of rehab process, OT purpose, POC, ELOS, and goals.  ADL assessment with bathing  and dressing at sit >  stand level from EOB.  Pt able to recall back precautions and recommendation of log roll technique for bed mobility.  Pt required mod assist for bed mobility to come to sitting at EOB.  Engaged in [place] bathing seated at EOB with setup and cues to continue on with task as pt demonstrating difficulty with selective attention.  Therapist provided max-total assist for LB bathing and dressing due to back precautions.  Discussed AE for LB bathing and dressing to increase independence - plan to utilize during future bathing and dressing session.  Engaged in sit > stand with mod assist for elevated EOB.  Therapist obtained w/c with higher seat surface to allow for increased ease with sit <> stand from w/c.  Completed stand pivot transfer bed > w/c with RW with min-mod assist and facilitation to maintain proper positioning within RW during transfer.  Pt remained upright in w/c with all needs in reach.  RN aware of position.   ADL ADL Grooming: Setup Where Assessed-Grooming: Sitting at sink Upper Body Bathing: Setup;Supervision/safety Where Assessed-Upper Body Bathing: Edge of bed Lower Body Bathing: Maximal assistance Where Assessed-Lower Body Bathing: Edge of bed Upper Body Dressing: Moderate assistance Where Assessed-Upper Body Dressing: Edge of bed Lower Body Dressing: Maximal assistance Where Assessed-Lower Body Dressing: Edge of bed Toilet Transfer: Moderate assistance Toilet Transfer Method: Stand pivot Toilet Transfer Equipment: Bedside commode Mobility  Bed Mobility Bed Mobility: Rolling Right;Right Sidelying to Sit Rolling Right: Moderate Assistance - Patient 50-74% Right Sidelying to Sit: Moderate Assistance - Patient 50-74% Transfers Sit to Stand: Moderate Assistance - Patient 50-74% Stand to Sit: Moderate Assistance - Patient 50-74%   Discharge Criteria: Patient will be discharged from OT if patient refuses treatment 3 consecutive times without medical reason, if treatment goals not met,  if there is a change in medical status, if patient makes no progress towards goals or if patient is discharged from hospital.  The above assessment, treatment plan, treatment alternatives and goals were discussed and mutually agreed upon: by patient  Simonne Come 02/14/2020, 12:59 PM

## 2020-02-15 ENCOUNTER — Inpatient Hospital Stay (HOSPITAL_COMMUNITY): Payer: Medicare Other

## 2020-02-15 DIAGNOSIS — M5416 Radiculopathy, lumbar region: Secondary | ICD-10-CM | POA: Diagnosis not present

## 2020-02-15 LAB — GLUCOSE, CAPILLARY
Glucose-Capillary: 128 mg/dL — ABNORMAL HIGH (ref 70–99)
Glucose-Capillary: 109 mg/dL — ABNORMAL HIGH (ref 70–99)
Glucose-Capillary: 153 mg/dL — ABNORMAL HIGH (ref 70–99)
Glucose-Capillary: 144 mg/dL — ABNORMAL HIGH (ref 70–99)

## 2020-02-15 MED ORDER — SORBITOL 70 % SOLN
30.0000 mL | Freq: Every day | Status: DC | PRN
  Filled 2020-02-15: qty 30

## 2020-02-15 MED ORDER — ALBUTEROL SULFATE (2.5 MG/3ML) 0.083% IN NEBU
2.5000 mg | INHALATION_SOLUTION | Freq: Three times a day (TID) | RESPIRATORY_TRACT | Status: DC | PRN
  Administered 2020-02-23: 2.5 mg via RESPIRATORY_TRACT
  Filled 2020-02-15 (×2): qty 3

## 2020-02-15 NOTE — Progress Notes (Signed)
Pt was requesting a "Nebulizer" for his constant coughing, and to break up any sputum. Dr. Dagoberto Ligas was notified and PRN albuterol neb order was placed. Respiratory came and assessed the pt and turns out the Pt wanted only the spirometer not a nebulizer, he didn't know the correct name for the Spirometer. Spirometer was given to pt. No other concerns at this time.   Bertram Millard LPN

## 2020-02-15 NOTE — Progress Notes (Deleted)
New order from Dr. Dagoberto Ligas for albuterol 2.5MG Fayne Mediate Nebuilizer solution 3 times daily PRN. Due to pt having constant coughing and requesting a treatment.

## 2020-02-15 NOTE — Progress Notes (Addendum)
Physical Therapy Session Note  Patient Details  Name: James [name]. MRN: [id number] Date of Birth: Aug 05, 1950  Today's Date: 02/15/2020 PT Individual Time: 0350-0938; 1829-9371 PT Individual Time Calculation (min): 60 min  , 35 min  Short Term Goals: Week 1:  PT Short Term Goal 1 (Week 1): Patient will consistently perform sit to stand transfers with S PT Short Term Goal 2 (Week 1): Patient will demonstrate standing tolerance for 3 minutes during functional task. PT Short Term Goal 3 (Week 1): Patient will ambulate 33' with min A LRAD PT Short Term Goal 4 (Week 1): Patient will perform bed mobility with min A at least 50% of the time  Skilled Therapeutic Interventions/Progress Updates:  tx 1:  Pt seated in wc/  He rated surgical pain low back plus R lateral upper buttock 8/10 and requested pain meds, which were dispensed during the session.    PT and wife [name] donned pt's personal TEDS and donned non -slip socks.  PT repositioned LSO lower on pt, and educated pt and wife on donning it appropriately.  Sit> stand from wc with min assist to RW, max cues for foot and hand placement and trunk extension. Pt tolerated standing < 30 seconds, due to LBP.   Seated Therapeutic exercises performed with LEs to increase strength for functional mobility 10 x 1 bil heel raises, R/L ankle pumps with extended knees.  Self -stretching (with pt ed) for  R/L hamstrings seated using footstool, x 2 min; PROM R/L heel cords.  Gait training with rollator x 3', x 5', CGA with wc follow.  Distance limited by pain.  At end of session, pt seated in w/c with needs at hand and wife present.   tx 2:  Pt seated in w/c, motivated to start session; LSO had slipped up; PT adjusted it  Pt may benefit from a larger LSO.  He rated surgical back pain 7/10.  Therapeutic exercise performed with LE to increase strength for functional mobility: seated in w/c- 10 x 1 R/L ankle pumps iwht extended knee; use of Kinetron at  resistance 50 cm/sec x 2 minutes targeting quadriceps, x 30 seconds targeting gluteals.  Pain upper lateral R buttock limited pt.  Sit> stand with CGA/min assist, max cues for technique.  Gait training on level tile with rollator, x 13' with w/c follow, CGA.  Pt coughed up thick, yellow mucus; PT informed Douda, RN.   At end of session, pt seated in w/c iwht needs at hand and seat belt alarm set.      Therapy Documentation Precautions:  Precautions Precautions: Back Required Braces or Orthoses: Spinal Brace Spinal Brace: Lumbar corset, Applied in sitting position Restrictions Weight Bearing Restrictions: No           Therapy/Group: Individual Therapy  Ernest Popowski 02/15/2020, 12:17 PM

## 2020-02-15 NOTE — Progress Notes (Signed)
Suppository offered again, pt stated he wants to wait until after therapy. Day shift nurse is aware!   Bertram Millard LPN

## 2020-02-15 NOTE — Progress Notes (Signed)
Occupational Therapy Session Note  Patient Details  Name: James [name]. MRN: [id number] Date of Birth: March 22, 1951  Today's Date: 02/15/2020 OT Individual Time: 0700-0800 and 9470-9628 OT Individual Time Calculation (min): 60 min and 43 min     Short Term Goals: Week 1:  OT Short Term Goal 1 (Week 1): Pt will complete LB dressing with use of AE with mod assist OT Short Term Goal 2 (Week 1): Pt will complete toilet transfer with Min assist OT Short Term Goal 3 (Week 1): Pt will complete bathing with mod assist wiht use of AE OT Short Term Goal 4 (Week 1): Pt will complete toileting with mod assist  Skilled Therapeutic Interventions/Progress Updates:    Session 1: OT session focused on ADL retraining, functional transfers, endurance, and standing balance. Pt received in bed ready for session and able to recall back precautions. Completed supine>sit with mod A using log rolling technique. OT donned back brace then completed sit>stand with mod A and stand pivot transfer to w/c with min A. Completed bathing and dressing at sink, completing sit<>stand 3x with min A and remaining in standing ~20 sec. OT provided LH sponge for increased independence with LB bathing then provided max assist with LB dressing due to time. At end of session, pt let sitting in w/c with all needs in reach.  Session 2: OT session focused on standing balance, endurance, and ADL retraining. Pt received sitting in w/c with wife present and corset brace unfastened. Educated on importance of wearing while OOB with pt verbalizing difficulty keeping positioned. OT also had difficulty with sustaining appropriate positioning of brace during movement activities. OT demonstrated and educated pt on use of reach and sock-aid for LB. Practiced donning shorts and socks requiring max A with increased time. OT engaged client in dynamic standing activity 2x ~30 seconds with min A for sit<>stand. On 3rd attempt, OT provided max A however pt  unable to complete stand reporting max fatigue. Returned to room with pt requesting to remain in w/c therefore left with all needs in reach.  AE Sit<>stand and horseshoes brace  Therapy Documentation Precautions:  Precautions Precautions: Back Required Braces or Orthoses: Spinal Brace Spinal Brace: Lumbar corset, Applied in sitting position Restrictions Weight Bearing Restrictions: No General:   Vital Signs:  Pain: Pain Assessment Pain Scale: 0-10 Pain Score: 8  Pain Type: Surgical pain Pain Location: Back Pain Orientation: Lower Pain Descriptors / Indicators: Aching Pain Frequency: Constant Pain Onset: On-going Patients Stated Pain Goal: 2 Pain Intervention(s): Medication (See eMAR) ADL: ADL Grooming: Setup Where Assessed-Grooming: Sitting at sink Upper Body Bathing: Setup, Supervision/safety Where Assessed-Upper Body Bathing: Edge of bed Lower Body Bathing: Maximal assistance Where Assessed-Lower Body Bathing: Edge of bed Upper Body Dressing: Moderate assistance Where Assessed-Upper Body Dressing: Edge of bed Lower Body Dressing: Maximal assistance Where Assessed-Lower Body Dressing: Edge of bed Toilet Transfer: Moderate assistance Toilet Transfer Method: Stand pivot Toilet Transfer Equipment: Art gallery manager    Praxis   Exercises:   Other Treatments:     Therapy/Group: Individual Therapy  Duayne Cal 02/15/2020, 12:57 PM

## 2020-02-15 NOTE — Progress Notes (Signed)
[facility] PHYSICAL MEDICINE & REHABILITATION PROGRESS NOTE   Subjective/Complaints:  Pt reports no BM yet- going to take suppository after therapy today- might need more Sorbitol- will order.   Also feels a little more weak- and R "hip" hurting- but not bad.   Asking last night for ICS, not nebs- said likes ot use 10x/day easily.   Also concerned about taking K+,. Because said doesn't want it to go too high- not clear if pt took , but shows complete on orders.   ROS:  Pt denies SOB, abd pain, CP, N/V/C/D, and vision changes   Objective:   VAS [place] LOWER EXTREMITY VENOUS (DVT)  Result Date: 02/14/2020  Lower Venous DVT Study Indications: Swelling.  Risk Factors: Surgery 02-10-2020 Posterior lumbar fusion. Limitations: Patient seated in chair during exam, unable to relocate to bed. Comparison Study: No prior studies. Performing Technologist: Darlin Coco, RDMS  Examination Guidelines: A complete evaluation includes B-mode imaging, spectral Doppler, color Doppler, and power Doppler as needed of all accessible portions of each vessel. Bilateral testing is considered an integral part of a complete examination. Limited examinations for reoccurring indications may be performed as noted. The reflux portion of the exam is performed with the patient in reverse Trendelenburg.  +---------+---------------+---------+-----------+----------+--------------+ RIGHT    CompressibilityPhasicitySpontaneityPropertiesThrombus Aging +---------+---------------+---------+-----------+----------+--------------+ CFV      Full           Yes      Yes                                 +---------+---------------+---------+-----------+----------+--------------+ SFJ      Full                                                        +---------+---------------+---------+-----------+----------+--------------+ FV Prox  Full                                                         +---------+---------------+---------+-----------+----------+--------------+ FV Mid   Full                                                        +---------+---------------+---------+-----------+----------+--------------+ FV DistalFull                                                        +---------+---------------+---------+-----------+----------+--------------+ PFV      Full                                                        +---------+---------------+---------+-----------+----------+--------------+ POP      Full  Yes      Yes                                 +---------+---------------+---------+-----------+----------+--------------+ PTV      Full                                                        +---------+---------------+---------+-----------+----------+--------------+ PERO     Full                                                        +---------+---------------+---------+-----------+----------+--------------+   +---------+---------------+---------+-----------+----------+-------------------+ LEFT     CompressibilityPhasicitySpontaneityPropertiesThrombus Aging      +---------+---------------+---------+-----------+----------+-------------------+ CFV      Full           Yes      Yes                  Not well visualized                                                       due to patient                                                            position            +---------+---------------+---------+-----------+----------+-------------------+ SFJ      Full                                                             +---------+---------------+---------+-----------+----------+-------------------+ FV Prox  Full                                                             +---------+---------------+---------+-----------+----------+-------------------+ FV Mid   Full                                                              +---------+---------------+---------+-----------+----------+-------------------+ FV DistalFull                                                             +---------+---------------+---------+-----------+----------+-------------------+  PFV      Full                                                             +---------+---------------+---------+-----------+----------+-------------------+ POP      Full           Yes      Yes                                      +---------+---------------+---------+-----------+----------+-------------------+ PTV      Full                                                             +---------+---------------+---------+-----------+----------+-------------------+ PERO     Full                                                             +---------+---------------+---------+-----------+----------+-------------------+     Summary: RIGHT: - There is no evidence of deep vein thrombosis in the lower extremity.  - No cystic structure found in the popliteal fossa.  LEFT: - There is no evidence of deep vein thrombosis in the lower extremity.  - No cystic structure found in the popliteal fossa.  *See table(s) above for measurements and observations.    Preliminary    Recent Labs    02/13/20 0205 02/14/20 0343  WBC 8.3 7.2  HGB 9.8* 9.7*  HCT 29.8* 30.2*  PLT 160 182   Recent Labs    02/13/20 0205 02/14/20 0343  NA 141 143  K 3.3* 3.3*  CL 107 105  CO2 24 26  GLUCOSE 131* 129*  BUN 36* 40*  CREATININE 1.62* 1.59*  CALCIUM 8.7* 8.7*    Intake/Output Summary (Last 24 hours) at 02/15/2020 1034 Last data filed at 02/15/2020 6812 Gross per 24 hour  Intake 240 ml  Output 1100 ml  Net -860 ml        Physical Exam: Vital Signs Blood pressure (!) 111/53, pulse 67, temperature 98.8 F (37.1 C), temperature source Oral, resp. rate 16, height 5\' 8"  (1.727 m), weight 102 kg, SpO2 95 %.   Constitutional:  obese older male  sitting up in w/c- with therapy, NAD    Head: bruise R cheek and L temple- no change Cardiovascular: RRR Pulmonary: CTA B/L- no W/R/R- good air movement Abdominal: still soft, somewhat distended; NT, hypoactive BS  Musculoskeletal:     Cervical back: Neck supple.     Comments: UEs- 4/5 in deltoids, biceps,triceps and 4+/5 in WE, grip and finger abd L>R hand/fingers swollen/stiff- per wife, chronic LEs- RLE- HF 4-/5, KE 4/5, DF 2/5, and PF 4/5 LLE- HF 4/5, KE 4+/5, DF 2/5, PF 4/5  Skin:    Comments: Back incision is dressed Also, has dressing on sacrum for prevention, but no skin breakdown seen Heels OK/buttocks OK Neurological:  Comments: Ox3 Decreased sensation in stocking/glove distribution Also "normal" on LLE now; reduced on entire RLE.  Psychiatric: appropriate   Assessment/Plan: 1. Functional deficits due to spinal stenosis, lumbar radiculopathy  per day of interdisciplinary therapy in a comprehensive inpatient rehab setting.  Physiatrist is providing close team supervision and 24 hour management of active medical problems listed below.  Physiatrist and rehab team continue to assess barriers to discharge/monitor patient progress toward functional and medical goals  Care Tool:  Bathing    Body parts bathed by patient: Right arm, Left arm, Chest, Abdomen, Front perineal area, Face   Body parts bathed by helper: Buttocks, Right upper leg, Left upper leg, Right lower leg, Left lower leg     Bathing assist Assist Level: Maximal Assistance - Patient 24 - 49%     Upper Body Dressing/Undressing Upper body dressing   What is the patient wearing?: Pull over shirt    Upper body assist Assist Level: Minimal Assistance - Patient > 75%    Lower Body Dressing/Undressing Lower body dressing      What is the patient wearing?: Pants     Lower body assist Assist for lower body dressing: Total Assistance - Patient < 25%     Toileting Toileting    Toileting assist        Transfers Chair/bed transfer  Transfers assist     Chair/bed transfer assist level: Moderate Assistance - Patient 50 - 74%     Locomotion Ambulation   Ambulation assist      Assist level: Moderate Assistance - Patient 50 - 74% Assistive device: Walker-rolling Max distance: 8   Walk 10 feet activity   Assist  Walk 10 feet activity did not occur: Safety/medical concerns        Walk 50 feet activity   Assist Walk 50 feet with 2 turns activity did not occur: Safety/medical concerns         Walk 150 feet activity   Assist Walk 150 feet activity did not occur: Safety/medical concerns         Walk 10 feet on uneven surface  activity   Assist Walk 10 feet on uneven surfaces activity did not occur: Safety/medical concerns         Wheelchair     Assist Will patient use wheelchair at discharge?: No Type of Wheelchair: Manual    Wheelchair assist level: Supervision/Verbal cueing Max wheelchair distance: 30'    Wheelchair 50 feet with 2 turns activity    Assist    Wheelchair 50 feet with 2 turns activity did not occur: Safety/medical concerns       Wheelchair 150 feet activity     Assist  Wheelchair 150 feet activity did not occur: Safety/medical concerns       Blood pressure (!) 111/53, pulse 67, temperature 98.8 F (37.1 C), temperature source Oral, resp. rate 16, height 5\' 8"  (1.727 m), weight 102 kg, SpO2 95 %.  Medical Problem List and Plan: 1.  Decreased functional mobility secondary to L3-4 4-5 spondylolisthesis arthropathy with radiculopathy/spinal stenosis.  Status post bilateral L3-4 4-5 laminotomy foraminotomies decompression interbody fusion 02/10/2020.  Back corset when out of bed 11/13- c/o "weaker" this AM after therapy yesterday- likely due to fatigue             -patient may  Shower if covers back incision             -ELOS/Goals: 12- 16 days- min A to supervision 2.  Antithrombotics: -DVT/anticoagulation:  SCDs.  Check vascular study -will check with [facility] when can add Lovenox             -antiplatelet therapy: Resume Plavix 75 mg daily postop day #5 3. Pain Management: Flexeril as needed, Dilaudid 2-4 mg every 4 hours as needed pain  11/13- overall, pain controlled- R hip hurting some, but "not bad"-  4. Mood: Provide emotional support             -antipsychotic agents: N/A 5. Neuropsych: This patient is capable of making decisions on his own behalf. 6. Skin/Wound Care: Routine skin checks 7. Fluids/Electrolytes/Nutrition: Routine in and outs with with follow-up chemistries 8.  Acute on chronic anemia.  Follow-up CBC 9.  CAD with CABG/ICD.  Amiodarone 200 mg twice daily, Imdur 30 mg daily, Toprol 25 mg daily 10  COPD.  Check oxygen saturations every shift  11/12- O2 sats doing well- con't regimen 11.  CKD stage III with history of right nephrectomy. Has single L kidney  Baseline creatinine 1.47-1.50 12.  Diabetes mellitus.  Hemoglobin A1c 5.9.  Currently on Tradjenta 5 mg daily.  Patient on Levemir 10 units daily prior to admission.  Resume as needed  11/12- BGs all less than 150- decently controlled- con't regimen 13.  Chronic systolic congestive heart failure.  Entresto 49-51 mg twice daily, Lasix 40 mg twice daily.  Monitor for any signs of fluid overload. 14.  Hypertension.  Monitor with increased mobility 15.  Hyperlipidemia.  Lipitor 16.  Hypothyroidism.  Synthroid 17.  BPH.  Flomax 0.4 mg daily.  Check PVR- was doing in/put caths at home until 3 weeks ago- might need to resume 18. Insomnia- takes melatonin 6 mg QHS- will resume 19. Hypokalemia  11/12- will replete and recheck Monday  11/13- pt concerned shouldn't take- but explained his level is low- no other good way to replete.  20. Constipation  11/12- give sorbitol dose- 45G- and monitor  11/13- no BM yet- to take suppository today- will order another dose of sorbitol if needed    LOS: 2 days A FACE TO FACE EVALUATION WAS  PERFORMED  Kura Bethards 02/15/2020, 10:34 AM

## 2020-02-16 DIAGNOSIS — M5416 Radiculopathy, lumbar region: Secondary | ICD-10-CM | POA: Diagnosis not present

## 2020-02-16 LAB — GLUCOSE, CAPILLARY
Glucose-Capillary: 131 mg/dL — ABNORMAL HIGH (ref 70–99)
Glucose-Capillary: 118 mg/dL — ABNORMAL HIGH (ref 70–99)
Glucose-Capillary: 125 mg/dL — ABNORMAL HIGH (ref 70–99)
Glucose-Capillary: 150 mg/dL — ABNORMAL HIGH (ref 70–99)

## 2020-02-16 NOTE — Progress Notes (Signed)
[facility] PHYSICAL MEDICINE & REHABILITATION PROGRESS NOTE   Subjective/Complaints:   Pt reports had BM yesterday after I saw him.   "wore him out yesterday" in therapy.   Pain is just around the surgical incision- dilaudid helps it a lot.    ROS:  Pt denies SOB, abd pain, CP, N/V/C/D, and vision changes   Objective:   No results found. Recent Labs    02/14/20 0343  WBC 7.2  HGB 9.7*  HCT 30.2*  PLT 182   Recent Labs    02/14/20 0343  NA 143  K 3.3*  CL 105  CO2 26  GLUCOSE 129*  BUN 40*  CREATININE 1.59*  CALCIUM 8.7*    Intake/Output Summary (Last 24 hours) at 02/16/2020 1244 Last data filed at 02/16/2020 0700 Gross per 24 hour  Intake 780 ml  Output 1400 ml  Net -620 ml        Physical Exam: Vital Signs Blood pressure (!) 129/59, pulse 60, temperature 99.2 F (37.3 C), resp. rate 18, height 5\' 8"  (1.727 m), weight 102 kg, SpO2 96 %.   Constitutional: obese male, laying in bed- getting cleaned up by NT, NAD HEENT: R cheek and L temple- both less purple bruising Cardiovascular: RRR Pulmonary: CTA B/L- no W/R/R- good air movement Abdominal: Soft, NT, ND, (+)BS  Musculoskeletal:     Cervical back: Neck supple.     Comments: UEs- 4/5 in deltoids, biceps,triceps and 4+/5 in WE, grip and finger abd L>R hand/fingers swollen/stiff- per wife, chronic LEs- RLE- HF 4-/5, KE 4/5, DF 2/5, and PF 4/5 LLE- HF 4/5, KE 4+/5, DF 2/5, PF 4/5  Skin:    Comments: Back incision is dressed- C/D/I Also, has dressing on sacrum for prevention, but no skin breakdown seen Heels OK/buttocks OK 1+ LE pitting edema B/L- to ankles Neurological:     Comments: Ox3 Decreased sensation in stocking/glove distribution Also "normal" on LLE now; reduced on entire RLE.  Psychiatric: appropriate   Assessment/Plan: 1. Functional deficits due to spinal stenosis, lumbar radiculopathy  per day of interdisciplinary therapy in a comprehensive inpatient rehab  setting.  Physiatrist is providing close team supervision and 24 hour management of active medical problems listed below.  Physiatrist and rehab team continue to assess barriers to discharge/monitor patient progress toward functional and medical goals  Care Tool:  Bathing    Body parts bathed by patient: Right arm, Left arm, Chest, Front perineal area, Abdomen, Left upper leg, Right upper leg, Right lower leg, Left lower leg, Face   Body parts bathed by helper: Buttocks     Bathing assist Assist Level: Minimal Assistance - Patient > 75%     Upper Body Dressing/Undressing Upper body dressing   What is the patient wearing?: Pull over shirt    Upper body assist Assist Level: Minimal Assistance - Patient > 75%    Lower Body Dressing/Undressing Lower body dressing      What is the patient wearing?: Underwear/pull up     Lower body assist Assist for lower body dressing: Maximal Assistance - Patient 25 - 49%     Toileting Toileting    Toileting assist Assist for toileting: Moderate Assistance - Patient 50 - 74% (assist the pt with urinal)     Transfers Chair/bed transfer  Transfers assist     Chair/bed transfer assist level: Total Assistance - Patient < 25%     Locomotion Ambulation   Ambulation assist      Assist level: Moderate Assistance - Patient  50 - 74% Assistive device: Walker-rolling Max distance: 8   Walk 10 feet activity   Assist  Walk 10 feet activity did not occur: Safety/medical concerns        Walk 50 feet activity   Assist Walk 50 feet with 2 turns activity did not occur: Safety/medical concerns         Walk 150 feet activity   Assist Walk 150 feet activity did not occur: Safety/medical concerns         Walk 10 feet on uneven surface  activity   Assist Walk 10 feet on uneven surfaces activity did not occur: Safety/medical concerns         Wheelchair     Assist Will patient use wheelchair at discharge?:  No Type of Wheelchair: Manual    Wheelchair assist level: Supervision/Verbal cueing Max wheelchair distance: 30'    Wheelchair 50 feet with 2 turns activity    Assist    Wheelchair 50 feet with 2 turns activity did not occur: Safety/medical concerns       Wheelchair 150 feet activity     Assist  Wheelchair 150 feet activity did not occur: Safety/medical concerns       Blood pressure (!) 129/59, pulse 60, temperature 99.2 F (37.3 C), resp. rate 18, height 5\' 8"  (1.727 m), weight 102 kg, SpO2 96 %.  Medical Problem List and Plan: 1.  Decreased functional mobility secondary to L3-4 4-5 spondylolisthesis arthropathy with radiculopathy/spinal stenosis.  Status post bilateral L3-4 4-5 laminotomy foraminotomies decompression interbody fusion 02/10/2020.  Back corset when out of bed 11/13- c/o "weaker" this AM after therapy yesterday- likely due to fatigue  11/14- feeling better but "wore out with therapy".              -patient may  Shower if covers back incision             -ELOS/Goals: 12- 16 days- min A to supervision 2.  Antithrombotics: -DVT/anticoagulation: SCDs.  Check vascular study -will check with [facility] when can add Lovenox             -antiplatelet therapy: Resume Plavix 75 mg daily postop day #5 3. Pain Management: Flexeril as needed, Dilaudid 2-4 mg every 4 hours as needed pain  11/13- overall, pain controlled- R hip hurting some, but "not bad"-  11/14- pain controlled with dilaudid- con't regimen 4. Mood: Provide emotional support             -antipsychotic agents: N/A 5. Neuropsych: This patient is capable of making decisions on his own behalf. 6. Skin/Wound Care: Routine skin checks 7. Fluids/Electrolytes/Nutrition: Routine in and outs with with follow-up chemistries 8.  Acute on chronic anemia.  Follow-up CBC 9.  CAD with CABG/ICD.  Amiodarone 200 mg twice daily, Imdur 30 mg daily, Toprol 25 mg daily 10  COPD.  Check oxygen saturations every shift  11/12-  O2 sats doing well- con't regimen 11.  CKD stage III with history of right nephrectomy. Has single L kidney  Baseline creatinine 1.47-1.50  11/14- labs in AM 12.  Diabetes mellitus.  Hemoglobin A1c 5.9.  Currently on Tradjenta 5 mg daily.  Patient on Levemir 10 units daily prior to admission.  Resume as needed  11/12- BGs all less than 150- decently controlled- con't regimen  11/14- BGs 10- 153- con't regimen 13.  Chronic systolic congestive heart failure.  Entresto 49-51 mg twice daily, Lasix 40 mg twice daily.  Monitor for any signs of fluid overload. 14.  Hypertension.  Monitor with increased mobility 15.  Hyperlipidemia.  Lipitor 16.  Hypothyroidism.  Synthroid 17.  BPH.  Flomax 0.4 mg daily.  Check PVR- was doing in/put caths at home until 3 weeks ago- might need to resume 18. Insomnia- takes melatonin 6 mg QHS- will resume 19. Hypokalemia  11/12- will replete and recheck Monday  11/13- pt concerned shouldn't take- but explained his level is low- no other good way to replete.  20. Constipation  11/12- give sorbitol dose- 45G- and monitor  11/13- no BM yet- to take suppository today- will order another dose of sorbitol if needed  11/14- large BM yesterday AM- will monitor- right after I saw pt.     LOS: 3 days A FACE TO FACE EVALUATION WAS PERFORMED  Dannisha Eckmann 02/16/2020, 12:44 PM

## 2020-02-16 NOTE — IPOC Note (Signed)
Overall Plan of Care [facility]) Patient Details Name: James [name]. MRN: [id number] DOB: 04/11/1950  Admitting Diagnosis: Lumbar radiculopathy  Hospital Problems: Principal Problem:   Lumbar radiculopathy     Functional Problem List: Nursing Bladder, Bowel, Edema, Endurance, Medication Management, Nutrition, Perception, Skin Integrity  PT Balance, Edema, Endurance, Motor, Safety  OT Balance, Endurance, Motor, Pain, Safety  SLP    TR         Basic ADL's: OT Grooming, Bathing, Dressing, Toileting     Advanced  ADL's: OT       Transfers: PT Bed Mobility, Bed to Chair, Car, Manufacturing systems engineer, Metallurgist: PT Ambulation, Stairs     Additional Impairments: OT None  SLP        TR      Anticipated Outcomes Item Anticipated Outcome  Self Feeding    Swallowing      Basic self-care  Supervision - Min A  Toileting  Supervision   Bathroom Transfers Supervision  Bowel/Bladder  moderate I  Transfers  Supervision  Locomotion  Supervision w/ AD  Communication     Cognition     Pain  pain less than 2  Safety/Judgment  Moderate I   Therapy Plan: PT Intensity: Minimum of 1-2 x/day ,45 to 90 minutes PT Frequency: 5 out of 7 days PT Duration Estimated Length of Stay: 2-2.5 weeks OT Intensity: Minimum of 1-2 x/day, 45 to 90 minutes OT Frequency: 5 out of 7 days OT Duration/Estimated Length of Stay: 14-16 days     Due to the current state of emergency, patients may not be receiving their 3-hours of Medicare-mandated therapy.   Team Interventions: Nursing Interventions Bladder Management, Bowel Management, Disease Management/Prevention, Skin Care/Wound Management, Medication Management, Patient/Family Education  PT interventions Ambulation/gait training, Training and development officer, Functional mobility training, Patient/family education, Therapeutic Exercise, Therapeutic Activities, UE/LE Coordination activities, UE/LE Strength taining/ROM, Stair  training, Wheelchair propulsion/positioning, DME/adaptive equipment instruction  OT Interventions Training and development officer, Discharge planning, Disease mangement/prevention, DME/adaptive equipment instruction, Functional mobility training, Pain management, Patient/family education, Psychosocial support, Self Care/advanced ADL retraining, Splinting/orthotics, Skin care/wound managment, Therapeutic Activities, UE/LE Strength taining/ROM, Therapeutic Exercise  SLP Interventions    TR Interventions    SW/CM Interventions Discharge Planning, Psychosocial Support, Patient/Family Education   Barriers to Discharge MD  Medical stability, Home enviroment access/loayout, Wound care, Lack of/limited family support, Weight and Weight bearing restrictions  Nursing Other (comments)    PT      OT Decreased caregiver support    SLP      SW       Team Discharge Planning: Destination: PT-Home ,OT- Home , SLP-  Projected Follow-up: PT-Home health PT, OT-  Home health OT, SLP-  Projected Equipment Needs: PT-None recommended by PT, OT- Tub/shower bench, SLP-  Equipment Details: PT- , OT-  Patient/family involved in discharge planning: PT- Patient, Family member/caregiver,  OT-Patient, SLP-   MD ELOS: 2-2.5 weeks Medical Rehab Prognosis:  Good Assessment: Pt is a 69 yr old male with  radiculopathy and spinal stenosis s/p L3-5 lami, foraminotomies and fusion-- needs Lovenox when possible; CAD with ICD, COPD- not on O2 currently; DM well controlled and BPH= was doing caths at home- will monitor if needs.  Also having low K+- will replete.  Goals supervision by d/c.       See Team Conference Notes for weekly updates to the plan of care

## 2020-02-17 ENCOUNTER — Inpatient Hospital Stay (HOSPITAL_COMMUNITY): Payer: Medicare Other | Admitting: Occupational Therapy

## 2020-02-17 ENCOUNTER — Inpatient Hospital Stay (HOSPITAL_COMMUNITY): Payer: Medicare Other | Admitting: Physical Therapy

## 2020-02-17 DIAGNOSIS — Z905 Acquired absence of kidney: Secondary | ICD-10-CM

## 2020-02-17 DIAGNOSIS — E876 Hypokalemia: Secondary | ICD-10-CM

## 2020-02-17 DIAGNOSIS — K5903 Drug induced constipation: Secondary | ICD-10-CM

## 2020-02-17 DIAGNOSIS — R0989 Other specified symptoms and signs involving the circulatory and respiratory systems: Secondary | ICD-10-CM

## 2020-02-17 DIAGNOSIS — E1165 Type 2 diabetes mellitus with hyperglycemia: Secondary | ICD-10-CM

## 2020-02-17 DIAGNOSIS — I5032 Chronic diastolic (congestive) heart failure: Secondary | ICD-10-CM

## 2020-02-17 DIAGNOSIS — M25552 Pain in left hip: Secondary | ICD-10-CM

## 2020-02-17 LAB — CBC WITH DIFFERENTIAL/PLATELET
Abs Immature Granulocytes: 0.03 10*3/uL (ref 0.00–0.07)
Basophils Absolute: 0.1 10*3/uL (ref 0.0–0.1)
Basophils Relative: 1 %
Eosinophils Absolute: 0.3 10*3/uL (ref 0.0–0.5)
Eosinophils Relative: 4 %
HCT: 35.9 % — ABNORMAL LOW (ref 39.0–52.0)
Hemoglobin: 11.5 g/dL — ABNORMAL LOW (ref 13.0–17.0)
Immature Granulocytes: 1 %
Lymphocytes Relative: 22 %
Lymphs Abs: 1.4 10*3/uL (ref 0.7–4.0)
MCH: 29.9 pg (ref 26.0–34.0)
MCHC: 32 g/dL (ref 30.0–36.0)
MCV: 93.2 fL (ref 80.0–100.0)
Monocytes Absolute: 0.7 10*3/uL (ref 0.1–1.0)
Monocytes Relative: 11 %
Neutro Abs: 3.9 10*3/uL (ref 1.7–7.7)
Neutrophils Relative %: 61 %
Platelets: 286 10*3/uL (ref 150–400)
RBC: 3.85 MIL/uL — ABNORMAL LOW (ref 4.22–5.81)
RDW: 13.8 % (ref 11.5–15.5)
WBC: 6.3 10*3/uL (ref 4.0–10.5)
nRBC: 0 % (ref 0.0–0.2)

## 2020-02-17 LAB — BASIC METABOLIC PANEL
Anion gap: 12 (ref 5–15)
BUN: 29 mg/dL — ABNORMAL HIGH (ref 8–23)
CO2: 26 mmol/L (ref 22–32)
Calcium: 9.1 mg/dL (ref 8.9–10.3)
Chloride: 105 mmol/L (ref 98–111)
Creatinine, Ser: 1.37 mg/dL — ABNORMAL HIGH (ref 0.61–1.24)
GFR, Estimated: 56 mL/min — ABNORMAL LOW (ref >60)
Glucose, Bld: 134 mg/dL — ABNORMAL HIGH (ref 70–99)
Potassium: 3.6 mmol/L (ref 3.5–5.1)
Sodium: 143 mmol/L (ref 135–145)

## 2020-02-17 LAB — GLUCOSE, CAPILLARY
Glucose-Capillary: 124 mg/dL — ABNORMAL HIGH (ref 70–99)
Glucose-Capillary: 116 mg/dL — ABNORMAL HIGH (ref 70–99)
Glucose-Capillary: 167 mg/dL — ABNORMAL HIGH (ref 70–99)
Glucose-Capillary: 132 mg/dL — ABNORMAL HIGH (ref 70–99)

## 2020-02-17 MED ORDER — LIDOCAINE 5 % EX PTCH
1.0000 | MEDICATED_PATCH | CUTANEOUS | Status: DC
  Administered 2020-02-17 – 2020-02-18 (×2): 1 via TRANSDERMAL
  Filled 2020-02-17 (×8): qty 1

## 2020-02-17 MED ORDER — POLYETHYLENE GLYCOL 3350 17 G PO PACK
17.0000 g | PACK | Freq: Every day | ORAL | Status: DC
  Administered 2020-02-19 – 2020-02-28 (×10): 17 g via ORAL
  Filled 2020-02-17 (×11): qty 1

## 2020-02-17 NOTE — Progress Notes (Signed)
Occupational Therapy Session Note  Patient Details  Name: James [name]. MRN: [id number] Date of Birth: Jan 10, 1951  Today's Date: 02/17/2020 OT Individual Time: 0223-3612 OT Individual Time Calculation (min): 60 min   Short Term Goals: Week 1:  OT Short Term Goal 1 (Week 1): Pt will complete LB dressing with use of AE with mod assist OT Short Term Goal 2 (Week 1): Pt will complete toilet transfer with Min assist OT Short Term Goal 3 (Week 1): Pt will complete bathing with mod assist wiht use of AE OT Short Term Goal 4 (Week 1): Pt will complete toileting with mod assist  Skilled Therapeutic Interventions/Progress Updates:    Pt greeted semi-reclined in bed and agreeable to OT treatment session. Pt able to state 3/3 back precautions prior to OOB activities. Pt completed log roll technique with supervision to get into sidelying, then needed min A to elevate trunk. Pt sat EOB and worked on donning socks using sock-aid. Pt with wide feet and had difficulty pulling sock-aid up. Will try wider sock-aid tomorrow. Pt needed min A to don back brace with OT encouraging pt to don himself. Pt reported brace is too small and was difficult to keep in proper position. Pt then needed mod A to come to standing and 2 trials using rollater. Pt then pivoted over to wc wth min A. Pt brought to the sink and completed 3 more sit<>stands with min A. Pt tolerated standing to wash peri-area, but needed OT assist to wash buttocks and maintain back precautions given body habitus. OT educated on use of Mulga and pt demonstrated understanding to thread pants with min A. Pt needed OT assist to then pull pants up despite encouragement to use alternating UEs to manage pants. UB bathing then completed from sitting posititin in wc at the sink with min A overall 2/2 L shoulder ROM deficits. Pt left seated in wc at end of session with breakfast tray set-up, call bell in reach, and needs met.  Therapy  Documentation Precautions:  Precautions Precautions: Back Required Braces or Orthoses: Spinal Brace Spinal Brace: Lumbar corset, Applied in sitting position Restrictions Weight Bearing Restrictions: No Pain:  Pt reports pain at 7/10. Pt recently received pain medications    Therapy/Group: Individual Therapy  Valma Cava 02/17/2020, 11:43 AM

## 2020-02-17 NOTE — Progress Notes (Signed)
Occupational Therapy Session Note  Patient Details  Name: James [name]. MRN: [id number] Date of Birth: 03-27-1951  Today's Date: 02/17/2020 OT Individual Time: 1530-1630 OT Individual Time Calculation (min): 60 min    Short Term Goals: Week 1:  OT Short Term Goal 1 (Week 1): Pt will complete LB dressing with use of AE with mod assist OT Short Term Goal 2 (Week 1): Pt will complete toilet transfer with Min assist OT Short Term Goal 3 (Week 1): Pt will complete bathing with mod assist wiht use of AE OT Short Term Goal 4 (Week 1): Pt will complete toileting with mod assist   Skilled Therapeutic Interventions/Progress Updates:    Pt greeted at time of session sitting up in wheelchair agreeable to OT session, no pain initially but mild arthritic pain in L shoulder with activity, rest breaks as needed. Pt needing to use bathroom, encouraged to walk to bathroom instead of urinal which he performed Min A with rollator. Transfer to [facility] over toilet with Min A, Max A to doff clothing per pt request despite encouragement to try himself. Difficulty maneuering penis to fully fit into [facility] with some spillage, but eventually able to go. Shorts did get soiled, cleaned periarea with CGA in standing and donned new shorts with Mod A with use of reacher for back precautions. Pt able to recall 3/3 without assist. Ambulated part of the way back to wheelchair but fatigued, brought chair to pt and stand to sit Min A. Hand hygiene seated at sink level. Self propelled approx 10 feet before L shoulder fatigue, transported remaining distance to gym via therapist for time management and performed visual scanning/matching card game d/t pt reports of decreased vision after surgery, getting cards correct with 100% accuracy but noted running into some objects with rollator earlier. Once back in room, alarm on call bell inreach.   Therapy Documentation Precautions:  Precautions Precautions: Back Required Braces or Orthoses:  Spinal Brace Spinal Brace: Lumbar corset, Applied in sitting position Restrictions Weight Bearing Restrictions: No     Therapy/Group: Individual Therapy  Viona Gilmore 02/17/2020, 4:51 PM

## 2020-02-17 NOTE — Progress Notes (Signed)
Occupational Therapy Session Note  Patient Details  Name: James [name]. MRN: [id number] Date of Birth: November 26, 1950  Today's Date: 02/17/2020 OT Individual Time: 1135-1200 OT Individual Time Calculation (min): 25 min    Short Term Goals: Week 1:  OT Short Term Goal 1 (Week 1): Pt will complete LB dressing with use of AE with mod assist OT Short Term Goal 2 (Week 1): Pt will complete toilet transfer with Min assist OT Short Term Goal 3 (Week 1): Pt will complete bathing with mod assist wiht use of AE OT Short Term Goal 4 (Week 1): Pt will complete toileting with mod assist  Skilled Therapeutic Interventions/Progress Updates:    Treatment session with focus on sit > stand and use of AE for LB dressing.  Pt received upright in w/c reporting pain in hip area, however most posterior - most likely referred pain from back.  Pt completed sit > stand to Rollator x5 with focus on increased anterior weight shift and even scooting forward to increase ease of sit > stand.  Min faded to [facility] for all sit > stands this session.  Discussed use of AE for LB dressing with pt asking questions about logistics of use of reacher for LB dressing with verbalization and demonstration of various techniques to increase independence with threading pant legs with use of reacher.  Pt remained upright in w/c with all needs in reach and seat belt alarm on.  Therapy Documentation Precautions:  Precautions Precautions: Back Required Braces or Orthoses: Spinal Brace Spinal Brace: Lumbar corset, Applied in sitting position Restrictions Weight Bearing Restrictions: No Pain:  Pt with c/o pain in Rt hip.  Premedicated.   Therapy/Group: Individual Therapy  Simonne Come 02/17/2020, 12:34 PM

## 2020-02-17 NOTE — Progress Notes (Signed)
Physical Therapy Session Note  Patient Details  Name: James C Obarr Jr. MRN: [id number] Date of Birth: 03/09/1951  Today's Date: 02/17/2020 PT Individual Time: 0900-0945 PT Individual Time Calculation (min): 45 min   Short Term Goals: Week 1:  PT Short Term Goal 1 (Week 1): Patient will consistently perform sit to stand transfers with S PT Short Term Goal 2 (Week 1): Patient will demonstrate standing tolerance for 3 minutes during functional task. PT Short Term Goal 3 (Week 1): Patient will ambulate 25' with min A LRAD PT Short Term Goal 4 (Week 1): Patient will perform bed mobility with min A at least 50% of the time  Skilled Therapeutic Interventions/Progress Updates: Pt presented in w/c agreeable to therapy. Pt indicated some pain at incision site and R "hip"(pt aware expressed that he's aware it's technically not the hip but uses that for reference). PTA donned LSO total A and transported pt to rehab gym for energy conservation and performed ankle pumps and LAQ x 15 bilaterally. Pt then performed STS x 5 with minA fading to CGA. Pt attempted ambulation with RW with CGA STS but only able to ambulate approx 5ft before requesting to sit due to R "hip" pain, per pt only occurs with wt bearing, dissipates with seated rest. Pt noted to walk with narrow BOS and shortened shuffling steps. PTA trialed use of hot pack while performing seated activity. Pt transported to day room and participated in Cybex Kinetron at 50cm/sec for 4 x 1 minute bouts. Pt indicated some increased pain but not as much as standing.  Pt transported back to room at end of session with PTA removing hot pack and LSO. Pt left with belt alarm on, call bell within reach and current needs met.      Therapy Documentation Precautions:  Precautions Precautions: Back Required Braces or Orthoses: Spinal Brace Spinal Brace: Lumbar corset, Applied in sitting position Restrictions Weight Bearing Restrictions: No General:   Vital  Signs: Therapy Vitals Temp: 98.2 F (36.8 C) Temp Source: Oral Pulse Rate: 78 Resp: 18 BP: (!) 105/58 Patient Position (if appropriate): Sitting Oxygen Therapy SpO2: 100 % O2 Device: Room Air Pain: Pain Assessment Pain Score: 3    Therapy/Group: Individual Therapy  Rosita DeChalus  Rosita DeChalus, PTA  02/17/2020, 4:42 PM  

## 2020-02-17 NOTE — Progress Notes (Signed)
PHYSICAL MEDICINE & REHABILITATION PROGRESS NOTE   Subjective/Complaints: Patient seen sitting up in bed this morning.  He states he slept well overnight.  He notes right lateral hip pain.   ROS: + Right lateral hip pain.  Denies SOB, abd pain, CP, N/V/C/D, and vision changes   Objective:   No results found. Recent Labs    02/17/20 0825  WBC 6.3  HGB 11.5*  HCT 35.9*  PLT 286   Recent Labs    02/17/20 0825  NA 143  K 3.6  CL 105  CO2 26  GLUCOSE 134*  BUN 29*  CREATININE 1.37*  CALCIUM 9.1    Intake/Output Summary (Last 24 hours) at 02/17/2020 1500 Last data filed at 02/17/2020 0800 Gross per 24 hour  Intake 600 ml  Output 1150 ml  Net -550 ml        Physical Exam: Vital Signs Blood pressure (!) 105/58, pulse 78, temperature 98.2 F (36.8 C), temperature source Oral, resp. rate 18, height 5\' 8"  (1.727 m), weight 102 kg, SpO2 100 %. Constitutional: No distress . Vital signs reviewed. HENT: Normocephalic.  Atraumatic. Eyes: EOMI. No discharge. Cardiovascular: No JVD.  RRR. Respiratory: Normal effort.  No stridor.  Bilateral clear to auscultation. GI: Non-distended.  BS +. Skin: Warm and dry.  Intact. Psych: Normal mood.  Normal behavior. Musc: Right lateral hip with mild tenderness.  Some edema in hands Neuro: Alert Motor: Bilateral upper extremities: 4+/5 proximal distal Right lower extremity: Hip flexion, knee extension, ankle dorsiflexion 4/5 Left lower extremity: Hip flexion, knee extension 4/5, ankle dorsiflexion 4 -/5  Assessment/Plan: 1. Functional deficits due to spinal stenosis, lumbar radiculopathy  per day of interdisciplinary therapy in a comprehensive inpatient rehab setting.  Physiatrist is providing close team supervision and 24 hour management of active medical problems listed below.  Physiatrist and rehab team continue to assess barriers to discharge/monitor patient progress toward functional and medical goals  Care  Tool:  Bathing    Body parts bathed by patient: Right arm, Left arm, Chest, Front perineal area, Abdomen, Left upper leg, Right upper leg, Right lower leg, Left lower leg, Face   Body parts bathed by helper: Buttocks     Bathing assist Assist Level: Minimal Assistance - Patient > 75%     Upper Body Dressing/Undressing Upper body dressing   What is the patient wearing?: Pull over shirt    Upper body assist Assist Level: Minimal Assistance - Patient > 75%    Lower Body Dressing/Undressing Lower body dressing      What is the patient wearing?: Pants     Lower body assist Assist for lower body dressing: Moderate Assistance - Patient 50 - 74%     Toileting Toileting    Toileting assist Assist for toileting: Moderate Assistance - Patient 50 - 74% (set urinal)     Transfers Chair/bed transfer  Transfers assist     Chair/bed transfer assist level: Minimal Assistance - Patient > 75%     Locomotion Ambulation   Ambulation assist      Assist level: Moderate Assistance - Patient 50 - 74% Assistive device: Walker-rolling Max distance: 8   Walk 10 feet activity   Assist  Walk 10 feet activity did not occur: Safety/medical concerns        Walk 50 feet activity   Assist Walk 50 feet with 2 turns activity did not occur: Safety/medical concerns         Walk 150 feet activity   Assist  Walk 150 feet activity did not occur: Safety/medical concerns         Walk 10 feet on uneven surface  activity   Assist Walk 10 feet on uneven surfaces activity did not occur: Safety/medical concerns         Wheelchair     Assist Will patient use wheelchair at discharge?: No Type of Wheelchair: Manual    Wheelchair assist level: Supervision/Verbal cueing Max wheelchair distance: 30'    Wheelchair 50 feet with 2 turns activity    Assist    Wheelchair 50 feet with 2 turns activity did not occur: Safety/medical concerns       Wheelchair 150  feet activity     Assist  Wheelchair 150 feet activity did not occur: Safety/medical concerns       Blood pressure (!) 105/58, pulse 78, temperature 98.2 F (36.8 C), temperature source Oral, resp. rate 18, height 5\' 8"  (1.727 m), weight 102 kg, SpO2 100 %.  Medical Problem List and Plan: 1.  Decreased functional mobility secondary to L3-4 4-5 spondylolisthesis arthropathy with radiculopathy/spinal stenosis.  Status post bilateral L3-4 4-5 laminotomy foraminotomies decompression interbody fusion 02/10/2020.  Back corset when out of bed 11/13- c/o "weaker" this AM after therapy yesterday- likely due to fatigue  Continue CIR 2.  Antithrombotics: -DVT/anticoagulation: SCDs.    -will check with NSU when can add Lovenox             -antiplatelet therapy: Plavix resumed 3. Pain Management: Flexeril as needed, Dilaudid 2-4 mg every 4 hours as needed pain  Lidoderm patch started on 11/15 for right lateral hip pain 4. Mood: Provide emotional support             -antipsychotic agents: N/A 5. Neuropsych: This patient is capable of making decisions on his own behalf. 6. Skin/Wound Care: Routine skin checks 7. Fluids/Electrolytes/Nutrition: Routine in and outs 8.  Acute on chronic anemia.    Hemoglobin 11.5 on 11/15 9.  CAD with CABG/ICD.  Amiodarone 200 mg twice daily, Imdur 30 mg daily, Toprol 25 mg daily 10  COPD.  Check oxygen saturations every shift 11.  CKD stage III with history of right nephrectomy. Has single L kidney  Baseline creatinine 1.47-1.50  Creatinine 1.37 on 11/15, improving 12.  Diabetes mellitus with hyperglycemia.  Hemoglobin A1c 5.9.  Currently on Tradjenta 5 mg daily.  Patient on Levemir 10 units daily prior to admission.  Resume as needed  CBGs relatively controlled on 1/15 13.  Chronic systolic congestive heart failure.  Entresto 49-51 mg twice daily, Lasix 40 mg twice daily.  Monitor for any signs of fluid overload. Filed Weights   02/13/20 1550  Weight: 102 kg    14.  Hypertension.  Monitor with increased mobility  Labile on 11/15, monitor for trend 15.  Hyperlipidemia.  Lipitor 16.  Hypothyroidism.  Synthroid 17.  BPH.  Flomax 0.4 mg daily.    Appears to be voiding 18. Insomnia- takes melatonin 6 mg QHS- will resume 19. Hypokalemia  Potassium 3.6 on 11/15 after supplementation 20.  Drug-induced constipation  Bowel meds increased on 11/15.     LOS: 4 days A FACE TO FACE EVALUATION WAS PERFORMED  Lynnwood Beckford Lorie Phenix 02/17/2020, 3:00 PM

## 2020-02-18 ENCOUNTER — Inpatient Hospital Stay (HOSPITAL_COMMUNITY): Payer: Medicare Other | Admitting: Occupational Therapy

## 2020-02-18 ENCOUNTER — Inpatient Hospital Stay (HOSPITAL_COMMUNITY): Payer: Medicare Other

## 2020-02-18 LAB — GLUCOSE, CAPILLARY
Glucose-Capillary: 125 mg/dL — ABNORMAL HIGH (ref 70–99)
Glucose-Capillary: 135 mg/dL — ABNORMAL HIGH (ref 70–99)
Glucose-Capillary: 147 mg/dL — ABNORMAL HIGH (ref 70–99)
Glucose-Capillary: 118 mg/dL — ABNORMAL HIGH (ref 70–99)

## 2020-02-18 MED ORDER — ENOXAPARIN SODIUM 60 MG/0.6ML ~~LOC~~ SOLN
50.0000 mg | SUBCUTANEOUS | Status: DC
  Administered 2020-02-18 – 2020-03-04 (×16): 50 mg via SUBCUTANEOUS
  Filled 2020-02-18 (×16): qty 0.5

## 2020-02-18 NOTE — Progress Notes (Signed)
[place] PHYSICAL MEDICINE & REHABILITATION PROGRESS NOTE   Subjective/Complaints:  Pt asking if could use Icy hot or horse linament for R lateral hip pain- is fine with using topicals- doesn't want to add other pain meds. I'm OK with topicals from home.   Had bowel movement/accident yesterday- nurse called, but didn't make it in time.   coughing up phlegm QOD- yellowish/brownish per pt- only 1x every other day- small amount- doesn't feel sick otherwise.     ROS:  Pt denies SOB, abd pain, CP, N/V/C/D, and vision changes    Objective:   No results found. Recent Labs    02/17/20 0825  WBC 6.3  HGB 11.5*  HCT 35.9*  PLT 286   Recent Labs    02/17/20 0825  NA 143  K 3.6  CL 105  CO2 26  GLUCOSE 134*  BUN 29*  CREATININE 1.37*  CALCIUM 9.1    Intake/Output Summary (Last 24 hours) at 02/18/2020 0908 Last data filed at 02/18/2020 0700 Gross per 24 hour  Intake 480 ml  Output 550 ml  Net -70 ml        Physical Exam: Vital Signs Blood pressure (!) 126/56, pulse 60, temperature 97.9 F (36.6 C), temperature source Oral, resp. rate 18, height 5\' 8"  (1.727 m), weight 102 kg, SpO2 97 %. Constitutional: No distress . Vital signs reviewed.sitting up in w/c at sink- OT in room, NAD Eyes: EOMI. No discharge. Bruising on face- R cheek/L temple almost gone.  Cardiovascular: RRR_ no JVD. Respiratory: CTA B/L- no W/R/R- good air movement GI: Soft, NT, ND, (+)BS  Skin: Warm and dry.  Intact. Incision looks great on lumbar spine- steristrips intact.  Psych: appropriate Musc: Right lateral hip with mild tenderness.  Some edema in hands- hand edema chronic Neuro: Alert Motor: Bilateral upper extremities: 4+/5 proximal distal Right lower extremity: Hip flexion, knee extension, ankle dorsiflexion 4/5 Left lower extremity: Hip flexion, knee extension 4/5, ankle dorsiflexion 4 -/5  Assessment/Plan: 1. Functional deficits due to spinal stenosis, lumbar radiculopathy  per  day of interdisciplinary therapy in a comprehensive inpatient rehab setting.  Physiatrist is providing close team supervision and 24 hour management of active medical problems listed below.  Physiatrist and rehab team continue to assess barriers to discharge/monitor patient progress toward functional and medical goals  Care Tool:  Bathing    Body parts bathed by patient: Right arm, Left arm, Chest, Front perineal area, Abdomen, Left upper leg, Right upper leg, Right lower leg, Left lower leg, Face   Body parts bathed by helper: Buttocks     Bathing assist Assist Level: Minimal Assistance - Patient > 75%     Upper Body Dressing/Undressing Upper body dressing   What is the patient wearing?: Pull over shirt    Upper body assist Assist Level: Minimal Assistance - Patient > 75%    Lower Body Dressing/Undressing Lower body dressing      What is the patient wearing?: Pants     Lower body assist Assist for lower body dressing: Moderate Assistance - Patient 50 - 74%     Toileting Toileting    Toileting assist Assist for toileting: Moderate Assistance - Patient 50 - 74%     Transfers Chair/bed transfer  Transfers assist     Chair/bed transfer assist level: Minimal Assistance - Patient > 75%     Locomotion Ambulation   Ambulation assist      Assist level: Moderate Assistance - Patient 50 - 74% Assistive device: Walker-rolling Max  distance: 8   Walk 10 feet activity   Assist  Walk 10 feet activity did not occur: Safety/medical concerns        Walk 50 feet activity   Assist Walk 50 feet with 2 turns activity did not occur: Safety/medical concerns         Walk 150 feet activity   Assist Walk 150 feet activity did not occur: Safety/medical concerns         Walk 10 feet on uneven surface  activity   Assist Walk 10 feet on uneven surfaces activity did not occur: Safety/medical concerns         Wheelchair     Assist Will patient use  wheelchair at discharge?: No Type of Wheelchair: Manual    Wheelchair assist level: Supervision/Verbal cueing Max wheelchair distance: 30'    Wheelchair 50 feet with 2 turns activity    Assist    Wheelchair 50 feet with 2 turns activity did not occur: Safety/medical concerns       Wheelchair 150 feet activity     Assist  Wheelchair 150 feet activity did not occur: Safety/medical concerns       Blood pressure (!) 126/56, pulse 60, temperature 97.9 F (36.6 C), temperature source Oral, resp. rate 18, height 5\' 8"  (1.727 m), weight 102 kg, SpO2 97 %.  Medical Problem List and Plan: 1.  Decreased functional mobility secondary to L3-4 4-5 spondylolisthesis arthropathy with radiculopathy/spinal stenosis.  Status post bilateral L3-4 4-5 laminotomy foraminotomies decompression interbody fusion 02/10/2020.  Back corset when out of bed 11/13- c/o "weaker" this AM after therapy yesterday- likely due to fatigue  11/16- getting better almost daily  Continue CIR 2.  Antithrombotics: -DVT/anticoagulation: SCDs.    -will check with NSU when can add Lovenox  11/16- will start Lovenox- has been 8 days- no bleeding seen/H/H stable             -antiplatelet therapy: Plavix resumed 3. Pain Management: Flexeril as needed, Dilaudid 2-4 mg every 4 hours as needed pain  Lidoderm patch started on 11/15 for right lateral hip pain  11/16- can use icy hot or linament if patch isn't on.  4. Mood: Provide emotional support             -antipsychotic agents: N/A 5. Neuropsych: This patient is capable of making decisions on his own behalf. 6. Skin/Wound Care: Routine skin checks 7. Fluids/Electrolytes/Nutrition: Routine in and outs 8.  Acute on chronic anemia.    Hemoglobin 11.5 on 11/15 9.  CAD with CABG/ICD.  Amiodarone 200 mg twice daily, Imdur 30 mg daily, Toprol 25 mg daily 10  COPD.  Check oxygen saturations every shift 11.  CKD stage III with history of right nephrectomy. Has single L  kidney  Baseline creatinine 1.47-1.50  Creatinine 1.37 on 11/15, improving 12.  Diabetes mellitus with hyperglycemia.  Hemoglobin A1c 5.9.  Currently on Tradjenta 5 mg daily.  Patient on Levemir 10 units daily prior to admission.  Resume as needed  11/16- BGs 110s-160s- doing well- con't regimen 13.  Chronic systolic congestive heart failure.  Entresto 49-51 mg twice daily, Lasix 40 mg twice daily.  Monitor for any signs of fluid overload. Filed Weights   02/13/20 1550  Weight: 102 kg   14.  Hypertension.  Monitor with increased mobility  Labile on 11/15, monitor for trend 15.  Hyperlipidemia.  Lipitor 16.  Hypothyroidism.  Synthroid 17.  BPH.  Flomax 0.4 mg daily.    Appears to be voiding  18. Insomnia- takes melatonin 6 mg QHS- will resume 19. Hypokalemia  Potassium 3.6 on 11/15 after supplementation 20.  Drug-induced constipation  Bowel meds increased on 11/15.     LOS: 5 days A FACE TO FACE EVALUATION WAS PERFORMED  Kaseem Vastine 02/18/2020, 9:08 AM

## 2020-02-18 NOTE — Progress Notes (Signed)
Physical Therapy Session Note  Patient Details  Name: James [name]. MRN: [id number] Date of Birth: Oct 12, 1950  Today's Date: 02/18/2020 PT Individual Time:Session1: 7322-0254; Session2: 1500-1530 PT Individual Time Calculation (min): 55 min & 30 min  Short Term Goals: Week 1:  PT Short Term Goal 1 (Week 1): Patient will consistently perform sit to stand transfers with S PT Short Term Goal 2 (Week 1): Patient will demonstrate standing tolerance for 3 minutes during functional task. PT Short Term Goal 3 (Week 1): Patient will ambulate 45' with min A LRAD PT Short Term Goal 4 (Week 1): Patient will perform bed mobility with min A at least 50% of the time  Skilled Therapeutic Interventions/Progress Updates:    Patient in w/c propelled x 50' with S till L shoulder pain limited.  Pushed in w/c to therapy gym.  Sit to stand to rollator with min A increased time.  Ambulated 15', then 35', then 20' with rollator and min A; seated rests due to back pain increased and need for seated rest to relieve the pain.  Patient sit to supine on mat with wedge and two pillows mod A.  Patient positioned as comfortably as possible and assist for passive stretch to hip extensors, hamstrings, and hip rotators 2 x 20 sec hold each side then lateral trunk rotations (knee rocking) for reducing tension and decreased tone.  Patient rolled to R mod A due to close to EOB and side to sit mod A.  Patient sit to stand to rollator and SPT with min A to w/c.  Seated in w/c to perform LE therex LAQ x 2 x 5 w/2.5 # weights, then heel raises 2 x 10 reps.  Patient assisted in w/c to room and left seated with needs in reach and alarm belt activated.  Also adjusted brace throughout session for fit over lower spine as continues to ride up.  [place]:  Patient in w/c and states took pain meds about an hour ago.  Patient pushed in w/c to therapy gym for time and energy.  Performed sit to stand with min A increased time.  Patient negotiated  6" step with rollator and mod A turning only 90 degrees to descend at different edge of the step.  Reported severe pain (9/10) in R hip to turned to sit on rollator for relief. Sit to stand and turned rollator to walk to chair x 5' min A increased time and cues.  Patient in w/c assisted to room.  Noted pitting edema in lower legs and pt reported had kept legs dependent all day.  Assisted to stand pivot to bed with rollator with min A.  Sit to supine with HOB elevated mod A.  Positioned for comfort and left with bed alarm activated with socks removed.   Therapy Documentation Precautions:  Precautions Precautions: Back Required Braces or Orthoses: Spinal Brace Spinal Brace: Lumbar corset, Applied in sitting position Restrictions Weight Bearing Restrictions: No Pain: Pain Assessment Pain Scale: 0-10 Pain Score: 7  Pain Type: Acute pain Pain Location: Back Pain Orientation: Right;Lower Pain Descriptors / Indicators: Aching Pain Frequency: Intermittent Pain Onset: With Activity Patients Stated Pain Goal: 2 Pain Intervention(s): Heat applied;Rest    Therapy/Group: Individual Therapy  Reginia Naas  Magda Kiel, PT 02/18/2020, 12:20 PM

## 2020-02-18 NOTE — Progress Notes (Signed)
Patient ID: James [name]., male   DOB: March 21, 1951, 69 y.o.   MRN: [id number]  Met with pt and spoke with wife via telephone to discuss team conference goals CGA-min level and target discharge date 12/1. Both were pleased with this plan and wife will get the home health agency they would like to use at discharge. Continue to work on discharge needs.

## 2020-02-18 NOTE — Progress Notes (Signed)
Occupational Therapy Session Note  Patient Details  Name: James [name]. MRN: [id number] Date of Birth: 04/15/1950  Today's Date: 02/18/2020 OT Individual Time: 4163-8453 and 1305-1403 OT Individual Time Calculation (min): 56 min  And 58 min    Short Term Goals: Week 1:  OT Short Term Goal 1 (Week 1): Pt will complete LB dressing with use of AE with mod assist OT Short Term Goal 2 (Week 1): Pt will complete toilet transfer with Min assist OT Short Term Goal 3 (Week 1): Pt will complete bathing with mod assist wiht use of AE OT Short Term Goal 4 (Week 1): Pt will complete toileting with mod assist  Skilled Therapeutic Interventions/Progress Updates:    Pt greeted at time of session sitting up in wheelchair agreeable to OT session, no c/o pain at beginning of session, agreeable to ADL this am. Set up at sink level and doffed clothing with Min A for shirt and shorts, mod A to doff socks with technique using reacher. UB bathing Supervision, LB bathing Min A to wash buttocks with use of LHS to reach feet. Extended time required for all bathing and dressing tasks as pt moves at a slow pace. Dried off in same manner. UB dress Supervision for shirt encouraging threading LUE first as he has arthritic shoulder pain, Mod A for LB dress with use of reacher to thread and pt frequently gets legs in wrong pant hole, able to help don over hips today. Pt fatigued after ADL session, set up in room with alarm on and call bell in reach.  Session 2: Pt greeted at time of session sitting up in wheelchair agreeable to OT session, no c/o pain. Requesting to brush teeth and wash face which he performed with set up from wheelchair level. Transported to shower room via wheelchair for time management and therapist demonstrated TTB transfer and energy conservation techniques to make showering at home easier, pt receptive but declined to perform today. Dynamic standing at dynavision for 3 rounds of 40 sec, 1 min, 1 min with  unilateral support to reach out of BOS and cross midline, rest breaks in between. Mild R hip discomfort noted, rest break provided and repositioning.Transported back to room via wheelchair for time management and stand pivot to recliner Min A with rollator, before pt stating he wanted to go back to wheelchair, transferred back in same manner. Pt fatigued, set up with alarm on, call bell in reach.   Therapy Documentation Precautions:  Precautions Precautions: Back Required Braces or Orthoses: Spinal Brace Spinal Brace: Lumbar corset, Applied in sitting position Restrictions Weight Bearing Restrictions: No     Therapy/Group: Individual Therapy  Viona Gilmore 02/18/2020, 8:49 AM

## 2020-02-18 NOTE — Patient Care Conference (Signed)
Inpatient RehabilitationTeam Conference and Plan of Care Update Date: 02/18/2020   Time: 11:32 AM    Patient Name: James [name]      Medical Record Number: [id number]  Date of Birth: 1950-09-12 Sex: Male         Room/Bed: 4M08C/4M08C-01 Payor Info: Payor: MEDICARE / Plan: MEDICARE PART A AND B / Product Type: *No Product type* /    Admit Date/Time:  02/13/2020  3:44 PM  Primary Diagnosis:  Lumbar radiculopathy  Hospital Problems: Principal Problem:   Lumbar radiculopathy Active Problems:   Drug induced constipation   Hypokalemia   Labile blood pressure   Chronic diastolic congestive heart failure (HCC)   Controlled type 2 diabetes mellitus with hyperglycemia, without long-term current use of insulin ([place])   Solitary kidney, acquired   Left hip pain    Expected Discharge Date: Expected Discharge Date: 03/04/20  Team Members Present: Physician leading conference: Dr. [name] Care Coodinator Present: Dorthula Nettles, RN, BSN, CRRN;Becky Dupree, LCSW Nurse Present: Suella Grove, RN PT Present: Magda Kiel, PT OT Present: Lillia Corporal, OT PPS Coordinator present : Ileana Ladd, Burna Mortimer, SLP     Current Status/Progress Goal Weekly Team Focus  Bowel/Bladder   continent B/B LBM 02/15/2020  to be independent with toileting  toilet patient q2h   Swallow/Nutrition/ Hydration             ADL's   Mod/Max LB dress, Min LB bathe with AE, assist to don brace (may need bigger one), ADL transfers Min/Mod, ambulated to bathroom but fatigues  Min LB dress, CGA ADL transfers, otherwise Supervision  AE training for back precautions, ADL transfers, endurance, standing tolerance   Mobility   min/CGA transfers, min A bed mobility, ambulation average 6', up to 13' with rollator and min A, 1 4" step max A  mainly supervision overall except min A car transfer and stairs  standing and ambulation tolerance, LE strenthening with sit to stand, balance   Communication              Safety/Cognition/ Behavioral Observations            Pain   lower back surgical pain continues 8/10 receives dilaudid q4h prn. requests med 3-4x/daily  reach pain goal 2/10  continue to treat pain as needed   Skin   back incision with surgical glue, clean, dry, well approximated and foam used for protection. Has scattered abrasions and ecchymosis all over  wound healing  assess and treat wounds qshift and as needed     Discharge Planning:  Home with wife who was assisting with bathing and dressing prior to admission-can provide CGA level of assist. Will come in when needed for education   Team Discussion: Continent B/B, 1 BM incontinent, BLE edema, dilaudid for pain. OT reports patient moves slow, mod assist for bathing and dressing, on-going education for brace, looks small and made need a larger brace. PT reports patient is min assist for transfers and slow to transfer. Goals are set at supervision. Also working on balance and standing tolerance. MD reports that patient is a daily wt and not sure if night shift is getting this and charting it. Patient will be going home with wife. Patient on target to meet rehab goals: yes  *See Care Plan and progress notes for long and short-term goals.   Revisions to Treatment Plan:  MD requesting daily weight to be charted every morning.  Teaching Needs: Continue with family education.  Current Barriers to  Discharge: Inaccessible home environment, Decreased caregiver support, Home enviroment access/layout, Incontinence, Wound care, Lack of/limited family support, Weight bearing restrictions, Medication compliance and Behavior  Possible Resolutions to Barriers: Continue with current medication regimen, educate on wound care and dressing changes, educate on weight bearing precautions and safety awareness, continue to encourage patient.     Medical Summary Current Status: incision looks good- no skin breakdown otherwise; Cr 1.37- stable; needs  daily weights; start lovenox; on Lasix; pain usually controlled; continent usually but had explosion yesterday- held miralax today.  Barriers to Discharge: Decreased family/caregiver support;Home enviroment access/layout;Incontinence;Medical stability;Weight;Weight bearing restrictions;Wound care;Other (comments)  Barriers to Discharge Comments: moves slowly; Mod A- LB; Min A transfers; using rollator; L shoulder, R hip and back pain; limits therapy/therapy tolerance Possible Resolutions to [employer] Focus: hold miralax; lots of pain after 35-40 ft; started lovenox;- d/c 12/1   Continued Need for Acute Rehabilitation Level of Care: The patient requires daily medical management by a physician with specialized training in physical medicine and rehabilitation for the following reasons: Direction of a multidisciplinary physical rehabilitation program to maximize functional independence : Yes Medical management of patient stability for increased activity during participation in an intensive rehabilitation regime.: Yes Analysis of laboratory values and/or radiology reports with any subsequent need for medication adjustment and/or medical intervention. : Yes   I attest that I was present, lead the team conference, and concur with the assessment and plan of the team.   Cristi Loron 02/18/2020, 5:02 PM

## 2020-02-19 ENCOUNTER — Inpatient Hospital Stay (HOSPITAL_COMMUNITY): Payer: Medicare Other | Admitting: Physical Therapy

## 2020-02-19 ENCOUNTER — Inpatient Hospital Stay (HOSPITAL_COMMUNITY): Payer: Medicare Other

## 2020-02-19 ENCOUNTER — Inpatient Hospital Stay (HOSPITAL_COMMUNITY): Payer: Medicare Other | Admitting: Occupational Therapy

## 2020-02-19 LAB — GLUCOSE, CAPILLARY
Glucose-Capillary: 113 mg/dL — ABNORMAL HIGH (ref 70–99)
Glucose-Capillary: 139 mg/dL — ABNORMAL HIGH (ref 70–99)
Glucose-Capillary: 128 mg/dL — ABNORMAL HIGH (ref 70–99)
Glucose-Capillary: 141 mg/dL — ABNORMAL HIGH (ref 70–99)

## 2020-02-19 NOTE — Progress Notes (Signed)
Nutrition Follow-up  DOCUMENTATION CODES:   Obesity unspecified  INTERVENTION:   - Continue Ensure Enlive po BID, each supplement provides 350 kcal and 20 grams of protein (please provide chocolate flavor per pt request)  - Encourage adequate PO intake  NUTRITION DIAGNOSIS:   Increased nutrient needs related to post-op healing, chronic illness (COPD, CHF) as evidenced by estimated needs.  Ongoing  GOAL:   Patient will meet greater than or equal to 90% of their needs  Progressing  MONITOR:   PO intake, Supplement acceptance, Labs, Weight trends, Skin, I & O's  REASON FOR ASSESSMENT:   Malnutrition Screening Tool    ASSESSMENT:   69 year old male with PMH of CKD stage III, CAD, NSTEMI, s/p CABG/ICD in 2015, CHF, right nephrectomy 2019, COPD, DM, HTN, HLD. Presented 02/10/20 with progressive low back pain. MRI demonstrated L3-4 and L4-5 spinal listhesis facet arthropathy spinal stenosis with radiculopathy. Pt underwent bilateral L3-4 and 4-5 laminotomy foraminotomy to decompress bilateral L3-4-5 nerve roots with posterior lumbar interbody fusion as well as transforaminal lumbar interbody fusion on 02/10/20. Admitted to CIR on 11/11.  Noted target d/c date of 03/04/20.  Noted plan for sorbitol this afternoon after therapies.  Weight stable.  Spoke with pt and wife at bedside. Pt's wife shares that pt is not in a good mood. Pt with short responses to RD questions and often saying "ask her" in reference to his wife. Pt endorses experiencing nausea the past 2 days at breakfast. He states that it affected his PO intake yesterday but that he was able to eat today.  Pt reports that he has not been receiving many Ensure Enlive supplements. Ensure Enlive is ordered BID. Per [facility] documentation, pt/family has been refusing most Ensure Enlive supplements when offered. Pt states that this is not correct and that he has not been offered the Ensure Enlive. Pt prefers chocolate flavor.  Meal  Completion: 25-100%  Medications reviewed and include: colace, Ensure Enlive BID, lasix, SSI, tradjenta, miralax, senna, vitamin D weekly  Labs reviewed. CBG's: 123-141 x 24 hours  UOP: 650 ml x 24 hours  Diet Order:   Diet Order            Diet Carb Modified Fluid consistency: Thin; Room service appropriate? Yes  Diet effective now                 EDUCATION NEEDS:   Education needs have been addressed  Skin:  Skin Assessment: Skin Integrity Issues: Incisions: back  Last BM:  02/17/20  Height:   Ht Readings from Last 1 Encounters:  02/13/20 5\' 8"  (1.727 m)    Weight:   Wt Readings from Last 1 Encounters:  02/18/20 102.9 kg    BMI:  Body mass index is 34.49 kg/m.  Estimated Nutritional Needs:   Kcal:  1900-2100  Protein:  95-110 grams  Fluid:  2.0 L/day    Gustavus Bryant, MS, RD, LDN Inpatient Clinical Dietitian Please see [facility] for contact information.

## 2020-02-19 NOTE — Progress Notes (Signed)
Physical Therapy Session Note  Patient Details  Name: James [name]. MRN: [id number] Date of Birth: 08-19-1950  Today's Date: 02/19/2020 PT Individual Time: 0800-0900 PT Individual Time Calculation (min): 60 min    Missed ~15 minutes of skilled physical therapy due to pain in R hip.    Short Term Goals: Week 1:  PT Short Term Goal 1 (Week 1): Patient will consistently perform sit to stand transfers with S PT Short Term Goal 2 (Week 1): Patient will demonstrate standing tolerance for 3 minutes during functional task. PT Short Term Goal 3 (Week 1): Patient will ambulate 75' with min A LRAD PT Short Term Goal 4 (Week 1): Patient will perform bed mobility with min A at least 50% of the time  Skilled Therapeutic Interventions/Progress Updates:    Pt received sitting up in [facility] and agreeable to PT. Pt requesting to void bladder prior to therapy session. Pt assisted with use of bedside urinal and able to void bladder (~75). Pt reporting pain in his R hip @ 8/10 (directly over gluteus medius) and pain around his incision site, but does not provide rating. Pt transported to main therapy gym in [facility] for time management and energy conservation.   Pt performed stand pivot transfer with use of rollator and CGA to mat table. Pt demonstrating short, shuffling steps and mild forward trunk lean secondary to pain in R hip. Pt performed sit>stand with min assist in order to perform ambulation.  Pt ambulated ~24f and ~5105fwith rollator and CGA. Pt continues to exhibit short, shuffling steps and R LE externally rotated. When asked to turn his R toes forward, pt states "it's been like that since I was 11 and broke my femur". Breaks provided between bouts of ambulation as patient reports that his endurance is decreased.   Pt instructed in side stepping at railing in hallway in order to work on activation of his glute med in order to help decrease pain. Performed ~1216fo his L, then reports that he needs to sit  down and would like to be administer pain medication. Pt unable to perform back to his R side. Returned to mat table for instruction in seated activities.   Pt instructed in seated hip abduction with level 1 theraband around knees to R side with external target for his R foot (pt instructed to keep L hip/LE in starting position). Pt able to perform 3 reps, then stops due to pain. Able to perform x8 on L, but continues to complain of pain in his R hip. Pt requesting to return to room and finish with therapy. Pt attempts to scoot hips forward for transfer, but requires mod-assist due to pain. PT raised mat, and assisted pt in sit>stand with min assist. Pt performs stand pivot transfer with CGA and rollator back to WC,[facility] back to room total assist.   In room, pt requesting PT to apply topical gel to R hip. Per pt report, physician approved use of topical cream found in room for muscle/joint pain relief. Pt also requested application of icy hot to his L shoulder due to reported arthritis. Dauda, RN made aware of topical cream application and R hip pain, as well as pt request for medication. Pt left sitting in WC with chair alarm on, needs met, and call bell in reach.   Throughout session, pt performed ~8 sit<>stands with min assist in order to perform tasks.   Therapy Documentation Precautions:  Precautions Precautions: Back Required Braces or Orthoses: Spinal  Brace Spinal Brace: Lumbar corset, Applied in sitting position Restrictions Weight Bearing Restrictions: No Vital Signs: Therapy Vitals Pulse Rate: 84 BP: (!) 158/82  Therapy/Group: Individual Therapy  Gaylord Shih 02/19/2020, 9:05 AM

## 2020-02-19 NOTE — Progress Notes (Signed)
Occupational Therapy Session Note  Patient Details  Name: James [name]. MRN: [id number] Date of Birth: 10-12-1950  Today's Date: 02/19/2020 OT Individual Time: 0092-3300 OT Individual Time Calculation (min): 68 min    Short Term Goals: Week 1:  OT Short Term Goal 1 (Week 1): Pt will complete LB dressing with use of AE with mod assist OT Short Term Goal 2 (Week 1): Pt will complete toilet transfer with Min assist OT Short Term Goal 3 (Week 1): Pt will complete bathing with mod assist wiht use of AE OT Short Term Goal 4 (Week 1): Pt will complete toileting with mod assist   Skilled Therapeutic Interventions/Progress Updates:    Pt greeted at time of session sitting up in wheelchair agreeable to OT session with wife [name] in room, stating he had significant R hip pain from previous am session but was premedicated. Reviewed current level of function with wife present and she seemed pleased with progress with AE. Pt wanting to change socks, provided reacher and sock aide and doffed shoes with Mod A and donned new socks with Min A, providing demonstration with sock aide first. Note pt does move slow and requires extended time for tasks. Able to achieve figure four for LLE but not R d/t hip. Pt agreeable to going outside for fresh air and uplifting mood, pt assisted with self propelling approx 10 feet intervals and therapist assist when fatigued from L shoulder discomfort (arthritis) to get outside. 2x5 wheelchair pushups as well for tricep strengthening for sit to stands, mild R hip pain noted and rest breaks provided. Transported back to room via wheelchair for time management and energy conservation, set up with alarm on and call bell in reach.   Therapy Documentation Precautions:  Precautions Precautions: Back Required Braces or Orthoses: Spinal Brace Spinal Brace: Lumbar corset, Applied in sitting position Restrictions Weight Bearing Restrictions: No     Therapy/Group: Individual  Therapy  Viona Gilmore 02/19/2020, 4:58 PM

## 2020-02-19 NOTE — Progress Notes (Signed)
[place] PHYSICAL MEDICINE & REHABILITATION PROGRESS NOTE   Subjective/Complaints:   LBM Monday- will maintain current bowel program, because only 2 BMs since hospital admission.   R hip pain- lateral low back.  Very localized- constant with gait- aching-  Doesn't want (since has 1 kidney) to do  NSAIDs, appropriately and wants to wait on Prednisone since will increase BGs.   - will try topical meds today first.   ROS:   Pt denies SOB, abd pain, CP, N/V/C/D, and vision changes    Objective:   No results found. Recent Labs    02/17/20 0825  WBC 6.3  HGB 11.5*  HCT 35.9*  PLT 286   Recent Labs    02/17/20 0825  NA 143  K 3.6  CL 105  CO2 26  GLUCOSE 134*  BUN 29*  CREATININE 1.37*  CALCIUM 9.1    Intake/Output Summary (Last 24 hours) at 02/19/2020 0849 Last data filed at 02/19/2020 7893 Gross per 24 hour  Intake 360 ml  Output 1575 ml  Net -1215 ml        Physical Exam: Vital Signs Blood pressure (!) 158/82, pulse 84, temperature 98 F (36.7 C), temperature source Oral, resp. rate 18, height 5\' 8"  (1.727 m), weight 102.9 kg, SpO2 96 %. Constitutional: No distress . Sitting up in w/c in room, RN in room, NAD Eyes: EOMI. No discharge. Bruising on face- R cheek/L temple almost gone Cardiovascular: RRR- no JVD Respiratory: CTA B/L- no W/R/R- good air movement GI: Soft, NT, ND, (+)BS  Skin: Warm and dry.  Intact. Incision looks great on lumbar- TTP over R low back/lateral aspectspine- steristrips intact.  Psych: appropriate Musc: Right lateral hip with mild tenderness.  Some edema in hands- hand edema chronic Neuro: Alert Motor: Bilateral upper extremities: 4+/5 proximal distal Right lower extremity: Hip flexion, knee extension, ankle dorsiflexion 4/5 Left lower extremity: Hip flexion, knee extension 4/5, ankle dorsiflexion 4 -/5  Assessment/Plan: 1. Functional deficits due to spinal stenosis, lumbar radiculopathy  per day of interdisciplinary  therapy in a comprehensive inpatient rehab setting.  Physiatrist is providing close team supervision and 24 hour management of active medical problems listed below.  Physiatrist and rehab team continue to assess barriers to discharge/monitor patient progress toward functional and medical goals  Care Tool:  Bathing    Body parts bathed by patient: Right arm, Left arm, Chest, Front perineal area, Abdomen, Left upper leg, Right upper leg, Right lower leg, Left lower leg, Face   Body parts bathed by helper: Buttocks     Bathing assist Assist Level: Minimal Assistance - Patient > 75%     Upper Body Dressing/Undressing Upper body dressing   What is the patient wearing?: Pull over shirt    Upper body assist Assist Level: Minimal Assistance - Patient > 75%    Lower Body Dressing/Undressing Lower body dressing      What is the patient wearing?: Pants     Lower body assist Assist for lower body dressing: Moderate Assistance - Patient 50 - 74%     Toileting Toileting    Toileting assist Assist for toileting: Moderate Assistance - Patient 50 - 74%     Transfers Chair/bed transfer  Transfers assist     Chair/bed transfer assist level: Minimal Assistance - Patient > 75%     Locomotion Ambulation   Ambulation assist      Assist level: Minimal Assistance - Patient > 75% Assistive device: Walker-rolling Max distance: 35   Walk 10  feet activity   Assist  Walk 10 feet activity did not occur: Safety/medical concerns  Assist level: Minimal Assistance - Patient > 75% Assistive device: Walker-rolling   Walk 50 feet activity   Assist Walk 50 feet with 2 turns activity did not occur: Safety/medical concerns         Walk 150 feet activity   Assist Walk 150 feet activity did not occur: Safety/medical concerns         Walk 10 feet on uneven surface  activity   Assist Walk 10 feet on uneven surfaces activity did not occur: Safety/medical concerns          Wheelchair     Assist Will patient use wheelchair at discharge?: No Type of Wheelchair: Manual    Wheelchair assist level: Supervision/Verbal cueing Max wheelchair distance: 60'    Wheelchair 50 feet with 2 turns activity    Assist    Wheelchair 50 feet with 2 turns activity did not occur: Safety/medical concerns   Assist Level: Supervision/Verbal cueing   Wheelchair 150 feet activity     Assist  Wheelchair 150 feet activity did not occur: Safety/medical concerns       Blood pressure (!) 158/82, pulse 84, temperature 98 F (36.7 C), temperature source Oral, resp. rate 18, height 5\' 8"  (1.727 m), weight 102.9 kg, SpO2 96 %.  Medical Problem List and Plan: 1.  Decreased functional mobility secondary to L3-4 4-5 spondylolisthesis arthropathy with radiculopathy/spinal stenosis.  Status post bilateral L3-4 4-5 laminotomy foraminotomies decompression interbody fusion 02/10/2020.  Back corset when out of bed 11/13- c/o "weaker" this AM after therapy yesterday- likely due to fatigue  11/16- getting better almost daily  Continue CIR 2.  Antithrombotics: -DVT/anticoagulation: SCDs.    -will check with NSU when can add Lovenox  11/16- will start Lovenox- has been 8 days- no bleeding seen/H/H stable             -antiplatelet therapy: Plavix resumed 3. Pain Management: Flexeril as needed, Dilaudid 2-4 mg every 4 hours as needed pain  Lidoderm patch started on 11/15 for right lateral hip pain  11/16- can use icy hot or linament if patch isn't on.   11/17 - doesn't want to try prednisone or NSAIDs right now- I agree- will con't topicals and oral pain meds 4. Mood: Provide emotional support             -antipsychotic agents: N/A 5. Neuropsych: This patient is capable of making decisions on his own behalf. 6. Skin/Wound Care: Routine skin checks 7. Fluids/Electrolytes/Nutrition: Routine in and outs 8.  Acute on chronic anemia.    Hemoglobin 11.5 on 11/15 9.  CAD with  CABG/ICD.  Amiodarone 200 mg twice daily, Imdur 30 mg daily, Toprol 25 mg daily 10  COPD.  Check oxygen saturations every shift 11.  CKD stage III with history of right nephrectomy. Has single L kidney  Baseline creatinine 1.47-1.50  Creatinine 1.37 on 11/15, improving  11/17- wait on Prednisone and won't do NSAIDs due to Cr issues 12.  Diabetes mellitus with hyperglycemia.  Hemoglobin A1c 5.9.  Currently on Tradjenta 5 mg daily.  Patient on Levemir 10 units daily prior to admission.  Resume as needed  11/16- BGs 110s-160s- doing well- con't regimen  11/17- BGs 113-147- con't regimen 13.  Chronic systolic congestive heart failure.  Entresto 49-51 mg twice daily, Lasix 40 mg twice daily.  Monitor for any signs of fluid overload. Filed Weights   02/13/20 1550 02/18/20 1547  Weight: 102 kg 102.9 kg   14.  Hypertension.  Monitor with increased mobility  Labile on 11/15, monitor for trend  30/86- BP 578 systolic- will wait to change- likely pain related 15.  Hyperlipidemia.  Lipitor 16.  Hypothyroidism.  Synthroid 17.  BPH.  Flomax 0.4 mg daily.    Appears to be voiding 18. Insomnia- takes melatonin 6 mg QHS- will resume 19. Hypokalemia  Potassium 3.6 on 11/15 after supplementation 20.  Drug-induced constipation  Bowel meds increased on 11/15.   11/17- LBM Monday- will con't bowel meds for now.     LOS: 6 days A FACE TO FACE EVALUATION WAS PERFORMED  Vaniyah Lansky 02/19/2020, 8:49 AM

## 2020-02-19 NOTE — Progress Notes (Signed)
Physical Therapy Session Note  Patient Details  Name: James [name]. MRN: [id number] Date of Birth: Jun 20, 1950  Today's Date: 02/19/2020 PT Individual Time: 1100-1156 PT Individual Time Calculation (min): 56 min   Short Term Goals: Week 1:  PT Short Term Goal 1 (Week 1): Patient will consistently perform sit to stand transfers with S PT Short Term Goal 2 (Week 1): Patient will demonstrate standing tolerance for 3 minutes during functional task. PT Short Term Goal 3 (Week 1): Patient will ambulate 44' with min A LRAD PT Short Term Goal 4 (Week 1): Patient will perform bed mobility with min A at least 50% of the time  Skilled Therapeutic Interventions/Progress Updates:    Patient in w/c c/o increased pain after PT session this morning.  Patient seated for readjusting back brace and placing moist heat to low back on R.  Attempted seated cycling on pedal exerciser, but pt c/o increased pain with hip flexion so terminated.  Performed seated stretches for piriformis and hamstrings x 20 s hold x 2 reps.  Patient sit to stand with CGA to rolaltor, ambulated in room x 15' then attempted recliner for comfort with w/c cushion and 2 pillows under hips.  Patient positioned with feet elevated and reclined with pillows under arms.  Pt. C/o anxiety that he might not be able to get out of chair and denied improvement in pain so assisted back up and ambulated to sink to wash face and then back to w/c and pt reported mild pain improvement.  Left seated with seat belt alarm activated and needs in reach.  Therapy Documentation Precautions:  Precautions Precautions: Back Required Braces or Orthoses: Spinal Brace Spinal Brace: Lumbar corset, Applied in sitting position Restrictions Weight Bearing Restrictions: No General: PT Amount of Missed Time (min): 15 Minutes PT Missed Treatment Reason: Pain;Patient fatigue Vital Signs:  Pain: Pain Assessment Pain Scale: 0-10 Pain Score: 8  Pain Type: Surgical  pain Pain Location: Back Pain Orientation: Right;Lower Pain Descriptors / Indicators: Aching Pain Frequency: Constant Pain Onset: On-going Patients Stated Pain Goal: 2 Pain Intervention(s): Heat applied;Repositioned;Rest       Therapy/Group: Individual Therapy  Reginia Naas  Magda Kiel, PT 02/19/2020, 12:19 PM

## 2020-02-20 ENCOUNTER — Inpatient Hospital Stay (HOSPITAL_COMMUNITY): Payer: Medicare Other

## 2020-02-20 ENCOUNTER — Inpatient Hospital Stay (HOSPITAL_COMMUNITY): Payer: Medicare Other | Admitting: Occupational Therapy

## 2020-02-20 LAB — GLUCOSE, CAPILLARY
Glucose-Capillary: 123 mg/dL — ABNORMAL HIGH (ref 70–99)
Glucose-Capillary: 119 mg/dL — ABNORMAL HIGH (ref 70–99)
Glucose-Capillary: 142 mg/dL — ABNORMAL HIGH (ref 70–99)
Glucose-Capillary: 104 mg/dL — ABNORMAL HIGH (ref 70–99)

## 2020-02-20 MED ORDER — SORBITOL 70 % SOLN
30.0000 mL | Freq: Once | Status: AC
  Administered 2020-02-20: 30 mL via ORAL
  Filled 2020-02-20: qty 30

## 2020-02-20 MED ORDER — HYDROMORPHONE HCL 2 MG PO TABS
2.0000 mg | ORAL_TABLET | Freq: Every day | ORAL | Status: DC
  Administered 2020-02-20 – 2020-03-01 (×11): 2 mg via ORAL
  Filled 2020-02-20 (×11): qty 1

## 2020-02-20 MED ORDER — OXYCODONE HCL 5 MG PO TABS
5.0000 mg | ORAL_TABLET | ORAL | Status: DC | PRN
  Administered 2020-02-21 – 2020-02-25 (×10): 10 mg via ORAL
  Administered 2020-02-26: 5 mg via ORAL
  Administered 2020-02-26 – 2020-03-03 (×11): 10 mg via ORAL
  Filled 2020-02-20 (×23): qty 2

## 2020-02-20 NOTE — Progress Notes (Signed)
Pt's plan of care adjusted to 15/7 after speaking with care team and discussed with MD in team conference as pt currently unable to tolerate current therapy schedule with OT, PT, and SLP.   The patient's pain has been limiting for participation in therapy services and would benefit from a 15/7 schedule to maximize participation.

## 2020-02-20 NOTE — Progress Notes (Signed)
[place] PHYSICAL MEDICINE & REHABILITATION PROGRESS NOTE   Subjective/Complaints:  Said topical meds worked- but only for [employer].  R hip still hurting this AM.  Worn off Had nausea yesterday and this AM- - LBM Monday.  LBM was only the 2nd in the last week.   Wants Sorbitol- but after therapy.    ROS:   Pt denies SOB, abd pain, CP, N/V/C/D, and vision changes   Objective:   No results found. No results for input(s): WBC, HGB, HCT, PLT in the last 72 hours. No results for input(s): NA, K, CL, CO2, GLUCOSE, BUN, CREATININE, CALCIUM in the last 72 hours.  Intake/Output Summary (Last 24 hours) at 02/20/2020 0844 Last data filed at 02/20/2020 0724 Gross per 24 hour  Intake 240 ml  Output 650 ml  Net -410 ml        Physical Exam: Vital Signs Blood pressure (!) 128/59, pulse 60, temperature 98.3 F (36.8 C), temperature source Oral, resp. rate 18, height 5\' 8"  (1.727 m), weight 102.9 kg, SpO2 94 %. Constitutional: No distress .sitting up in w/c in room, wearing Lumbar brace, NAD Eyes: EOMI. No discharge. Bruising on face- R cheek/L temple almost gone Cardiovascular: RRR Respiratory: CTA B/L- no W/R/R- good air movement GI: Soft, NT, (+)BS - hypoactive- mildly distended Skin: Warm and dry.  Intact. Incision looks great on lumbar- TTP over R low back/lateral aspect spine- steristrips intact. No change in Tenderness  Psych: appropriate Musc: Right lateral hip with mild tenderness.  Some edema in hands- hand edema chronic Neuro: Alert Motor: Bilateral upper extremities: 4+/5 proximal distal Right lower extremity: Hip flexion, knee extension, ankle dorsiflexion 4/5 Left lower extremity: Hip flexion, knee extension 4/5, ankle dorsiflexion 4 -/5  Assessment/Plan: 1. Functional deficits due to spinal stenosis, lumbar radiculopathy  per day of interdisciplinary therapy in a comprehensive inpatient rehab setting.  Physiatrist is providing close team supervision and 24 hour  management of active medical problems listed below.  Physiatrist and rehab team continue to assess barriers to discharge/monitor patient progress toward functional and medical goals  Care Tool:  Bathing    Body parts bathed by patient: Right arm, Left arm, Chest, Front perineal area, Abdomen, Left upper leg, Right upper leg, Right lower leg, Left lower leg, Face   Body parts bathed by helper: Buttocks     Bathing assist Assist Level: Minimal Assistance - Patient > 75%     Upper Body Dressing/Undressing Upper body dressing   What is the patient wearing?: Pull over shirt    Upper body assist Assist Level: Minimal Assistance - Patient > 75%    Lower Body Dressing/Undressing Lower body dressing      What is the patient wearing?: Pants     Lower body assist Assist for lower body dressing: Moderate Assistance - Patient 50 - 74%     Toileting Toileting    Toileting assist Assist for toileting: Moderate Assistance - Patient 50 - 74%     Transfers Chair/bed transfer  Transfers assist     Chair/bed transfer assist level: Minimal Assistance - Patient > 75%     Locomotion Ambulation   Ambulation assist      Assist level: Minimal Assistance - Patient > 75% Assistive device: Walker-rolling Max distance: 15   Walk 10 feet activity   Assist  Walk 10 feet activity did not occur: Safety/medical concerns  Assist level: Minimal Assistance - Patient > 75% Assistive device: Walker-rolling   Walk 50 feet activity   Assist Walk  50 feet with 2 turns activity did not occur: Safety/medical concerns         Walk 150 feet activity   Assist Walk 150 feet activity did not occur: Safety/medical concerns         Walk 10 feet on uneven surface  activity   Assist Walk 10 feet on uneven surfaces activity did not occur: Safety/medical concerns         Wheelchair     Assist Will patient use wheelchair at discharge?: No Type of Wheelchair: Manual     Wheelchair assist level: Supervision/Verbal cueing Max wheelchair distance: 76'    Wheelchair 50 feet with 2 turns activity    Assist    Wheelchair 50 feet with 2 turns activity did not occur: Safety/medical concerns   Assist Level: Supervision/Verbal cueing   Wheelchair 150 feet activity     Assist  Wheelchair 150 feet activity did not occur: Safety/medical concerns       Blood pressure (!) 128/59, pulse 60, temperature 98.3 F (36.8 C), temperature source Oral, resp. rate 18, height 5\' 8"  (1.727 m), weight 102.9 kg, SpO2 94 %.  Medical Problem List and Plan: 1.  Decreased functional mobility secondary to L3-4 4-5 spondylolisthesis arthropathy with radiculopathy/spinal stenosis.  Status post bilateral L3-4 4-5 laminotomy foraminotomies decompression interbody fusion 02/10/2020.  Back corset when out of bed 11/13- c/o "weaker" this AM after therapy yesterday- likely due to fatigue  11/16- getting better almost daily  Continue CIR 2.  Antithrombotics: -DVT/anticoagulation: SCDs.    -will check with NSU when can add Lovenox  11/16- will start Lovenox- has been 8 days- no bleeding seen/H/H stable             -antiplatelet therapy: Plavix resumed 3. Pain Management: Flexeril as needed, Dilaudid 2-4 mg every 4 hours as needed pain  Lidoderm patch started on 11/15 for right lateral hip pain  11/16- can use icy hot or linament if patch isn't on.   11/17 - doesn't want to try prednisone or NSAIDs right now- I agree- will con't topicals and oral pain meds  11/18- had a good response with liniment wants to wait on oral meds.  4. Mood: Provide emotional support             -antipsychotic agents: N/A 5. Neuropsych: This patient is capable of making decisions on his own behalf. 6. Skin/Wound Care: Routine skin checks 7. Fluids/Electrolytes/Nutrition: Routine in and outs 8.  Acute on chronic anemia.    Hemoglobin 11.5 on 11/15 9.  CAD with CABG/ICD.  Amiodarone 200 mg twice  daily, Imdur 30 mg daily, Toprol 25 mg daily 10  COPD.  Check oxygen saturations every shift 11.  CKD stage III with history of right nephrectomy. Has single L kidney  Baseline creatinine 1.47-1.50  Creatinine 1.37 on 11/15, improving  11/17- wait on Prednisone and won't do NSAIDs due to Cr issues 12.  Diabetes mellitus with hyperglycemia.  Hemoglobin A1c 5.9.  Currently on Tradjenta 5 mg daily.  Patient on Levemir 10 units daily prior to admission.  Resume as needed  11/18- BGs 113-141- con't regimen 13.  Chronic systolic congestive heart failure.  Entresto 49-51 mg twice daily, Lasix 40 mg twice daily.  Monitor for any signs of fluid overload. Filed Weights   02/13/20 1550 02/18/20 1547  Weight: 102 kg 102.9 kg   14.  Hypertension.  Monitor with increased mobility  Labile on 11/15, monitor for trend  99/83- BP 382 systolic- will wait to  change- likely pain related  11/18- controlled- con't regimen 15.  Hyperlipidemia.  Lipitor 16.  Hypothyroidism.  Synthroid 17.  BPH.  Flomax 0.4 mg daily.    Appears to be voiding 18. Insomnia- takes melatonin 6 mg QHS- will resume 19. Hypokalemia  Potassium 3.6 on 11/15 after supplementation 20.  Drug-induced constipation  Bowel meds increased on 11/15.   11/17- LBM Monday- will con't bowel meds for now.    11/18- will give sorbitol- at 2pm.   LOS: 7 days A FACE TO FACE EVALUATION WAS PERFORMED  Lonnette Shrode 02/20/2020, 8:44 AM

## 2020-02-20 NOTE — Progress Notes (Addendum)
Occupational Therapy Weekly Progress Note  Patient Details  Name: James [name]. MRN: [id number] Date of Birth: 02-21-1951  Beginning of progress report period: February 14, 2020 End of progress report period: February 20, 2020  Today's Date: 02/20/2020 OT Individual Time:  730- 828 and 1316 - 1415   58 min and 59 min   Patient has met 3 of 4 short term goals.  Pt is overall Mod A with LB ADLs including bathing at sink level with use of LHS and reacher for back precautions, most difficulty with donning pants and underwear d/t confusion with AE but will continue to address. Min A to don back brace as he has difficulty getting appropriate tightness and fit, able to complete toilet transfers with CGA and occasional Min A if he has a loss of balance, but will continue to benefit from balance training and compensatory/adaptive techniques for LB ADLs. Pain in R hip has also been limiting for standing activities, medical team aware.   Patient continues to demonstrate the following deficits: muscle weakness, decreased cardiorespiratoy endurance, unbalanced muscle activation and decreased coordination,   and decreased standing balance, decreased postural control and decreased balance strategies and therefore will continue to benefit from skilled OT intervention to enhance overall performance with BADL and Reduce care partner burden.  Patient progressing toward long term goals..  Continue plan of care.  OT Short Term Goals Week 1:  OT Short Term Goal 1 (Week 1): Pt will complete LB dressing with use of AE with mod assist OT Short Term Goal 1 - Progress (Week 1): Met OT Short Term Goal 2 (Week 1): Pt will complete toilet transfer with Min assist OT Short Term Goal 2 - Progress (Week 1): Met OT Short Term Goal 3 (Week 1): Pt will complete bathing with mod assist wiht use of AE OT Short Term Goal 3 - Progress (Week 1): Met OT Short Term Goal 4 (Week 1): Pt will complete toileting with mod assist OT  Short Term Goal 4 - Progress (Week 1): Progressing toward goal Week 2:  OT Short Term Goal 1 (Week 2): Pt will complete toileting with mod assist using AE PRN OT Short Term Goal 2 (Week 2): Pt will complete bathing with AE with Min A or better OT Short Term Goal 3 (Week 2): Pt will perform LB dress with AE with Min A OT Short Term Goal 4 (Week 2): Pt will don back brace consistently with Supervision  Skilled Therapeutic Interventions/Progress Updates:    Pt greeted at time of session sitting up in wheelchair agreeable to OT session c/o R hip pain, RN and MD team aware of pain already, pt has had for past several days. Pt self propel up to sink level with Supervision and doffed shirt before completing UB bathing with Supervision. Donned shirt with Supervision and min A for orthotic for positioning and tightness, pt able to get brace around midsection but not low enough or tight enough, limited by L shoulder arthritic pain at times. Offered toileting given recent constipation, but declined toileting today. Donned new socks with sock aide Min A to fully maneuver sock onto aide. Sit to stand CGA and in static standing assisted with applying liniment cream in room to R hip in attempt to help with pain. Adjusted brace with Min A as pt's brace has tendency to ride up and not stay in place. Transported via wheelchair to gym and stand pivot to mat with rollator CGA. Dynamic standing on L leg for  single leg stance and performed RLE hip abduction and flexion 1x10 with mirror for feedback, stating he did have pain and activity terminated. Brought back to room via wheelchair where pain subsided in sitting. Set up with alarm on, call bell in reach.   Session 2: Pt greeted at time of session sitting up in wheelchair agreeable to OT session, c/o R hip pain and no number given. Moist heat removed, skin intact post heat from previous session. Declined applying liniment cream in room. Sit to stands in room CGA/close  supervision at rollator. Pt wanting to go outside, assist with self propelling part of the way to elevator to improve endurance and UB strength, assisted pt with remaining distance to elevators and outside. Walked approx 15-20 feet w/c <> bench with CGA with rollator and extended time, difficulty controlling descent. Transported back inside in same manner as above, pt wanting to attempt toileting as he had Mirilax today and no BM yet, ambulated w/c > bathroom approx 10 feet and stand pivot to [facility] over toilet with CGA, Mod A for clothing management and no BM/urine. When standing up to don back over hips, pt with one episode of posterior LOB, requiring Min/Mod A to recover. When walking back to wheelchair, pt stating he couldn't move his R foot needing chair brought to him, stand to sit CGA/Min for control. Left with NT to perform vitals.     Therapy Documentation Precautions:  Precautions Precautions: Back Required Braces or Orthoses: Spinal Brace Spinal Brace: Lumbar corset, Applied in sitting position Restrictions Weight Bearing Restrictions: No    Therapy/Group: Individual Therapy  Viona Gilmore 02/20/2020, 1:12 PM

## 2020-02-20 NOTE — Progress Notes (Signed)
Physical Therapy Session Note  Patient Details  Name: James [name]. MRN: [id number] Date of Birth: April 22, 1950  Today's Date: 02/20/2020 PT Individual Time: 2111-7356 PT Individual Time Calculation (min): 45 min   Short Term Goals: Week 1:  PT Short Term Goal 1 (Week 1): Patient will consistently perform sit to stand transfers with S PT Short Term Goal 2 (Week 1): Patient will demonstrate standing tolerance for 3 minutes during functional task. PT Short Term Goal 3 (Week 1): Patient will ambulate 33' with min A LRAD PT Short Term Goal 4 (Week 1): Patient will perform bed mobility with min A at least 50% of the time Week 2:     Skilled Therapeutic Interventions/Progress Updates:    PAIN 7/10 hip w/mobility.  States he uses "horse linoment oil" which gives him relief.  Has already applied.  Treatment to tolerance.   Pt initially oob in wc and agreeable to session, seen this am for 45 min session w/focus on short distance ambulation due to pain, LE strengthening.  Feels he may have "overdone it" yesterday w/gait > 26ft.  Pt transported to gym for session. Sit to stand w/cga and gait x 6ft x 2 w/Rollator, cga, cues for posture, decreased cadence.  Rests several min in sitting between efforts.  Standing in parallel bars performed mini marches, sidestep singles 2x3 each w/parallel bar support, repeated x 2 w/seated rest between sets.  Seated LAQs 3x12 #3 R, #4L Hamstring curls w/L2 therabance resistance 2x8R, 2x12 L   At end of session, pt transported back to room and Pt left oob in wc w/alarm belt set and needs in reach      Therapy Documentation Precautions:  Precautions Precautions: Back Required Braces or Orthoses: Spinal Brace Spinal Brace: Lumbar corset, Applied in sitting position Restrictions Weight Bearing Restrictions: No    Therapy/Group: Individual Therapy  Callie Fielding, [place] 02/20/2020, 12:39 PM

## 2020-02-21 ENCOUNTER — Inpatient Hospital Stay (HOSPITAL_COMMUNITY): Payer: Medicare Other | Admitting: Occupational Therapy

## 2020-02-21 ENCOUNTER — Inpatient Hospital Stay (HOSPITAL_COMMUNITY): Payer: Medicare Other

## 2020-02-21 LAB — GLUCOSE, CAPILLARY
Glucose-Capillary: 130 mg/dL — ABNORMAL HIGH (ref 70–99)
Glucose-Capillary: 126 mg/dL — ABNORMAL HIGH (ref 70–99)
Glucose-Capillary: 156 mg/dL — ABNORMAL HIGH (ref 70–99)
Glucose-Capillary: 117 mg/dL — ABNORMAL HIGH (ref 70–99)

## 2020-02-21 NOTE — Plan of Care (Signed)
  Problem: RH Bed Mobility Goal: LTG Patient will perform bed mobility with assist (PT) Description: LTG: Patient will perform bed mobility with assistance, with/without cues (PT). Flowsheets (Taken 02/21/2020 1707) LTG: Pt will perform bed mobility with assistance level of: Minimal Assistance - Patient > 75% Note: Downgraded due to slow progress with pain   Problem: RH Ambulation Goal: LTG Patient will ambulate in controlled environment (PT) Description: LTG: Patient will ambulate in a controlled environment, # of feet with assistance (PT). Flowsheets (Taken 02/21/2020 1707) LTG: Pt will ambulate in controlled environ  assist needed:: Supervision/Verbal cueing LTG: Ambulation distance in controlled environment: 50' Note: Downgraded due to slow progress due to pain.   Problem: RH Wheelchair Mobility Goal: LTG Patient will propel w/c in controlled environment (PT) Description: LTG: Patient will propel wheelchair in controlled environment, # of feet with assist (PT) Flowsheets (Taken 02/21/2020 1707) LTG: Pt will propel w/c in controlled environ  assist needed:: Supervision/Verbal cueing LTG: Propel w/c distance in controlled environment: 50' Note: Downgraded due to limited progress with pain

## 2020-02-21 NOTE — Progress Notes (Addendum)
Physical Therapy Weekly Progress Note  Patient Details  Name: James [name]. MRN: [id number] Date of Birth: 05/01/1950  Beginning of progress report period: February 13, 2020 End of progress report period: February 21, 2020  Today's Date: 02/21/2020 PT Individual Time: 9937-1696 PT Individual Time Calculation (min): 65 min   Patient has met 2 of 4 short term goals.  Patient has been limited in progress this week due to pain in R side lower back (PSIS) area and missing treatment time at times due to pain and fatigue.  Feel he has made progress, but just more slowly than expected.  Patient should be on target for S level goals, but may need decreased distance for ambulation and pt reports his daughter has already obtained a transport w/c for home.    Patient continues to demonstrate the following deficits muscle weakness and muscle joint tightness and decreased initiation, decreased problem solving and delayed processing and therefore will continue to benefit from skilled PT intervention to increase functional independence with mobility.  Patient progressing toward long term goals..  Plan of care revisions: changed to 15 hours over 7 days due to limited tolerance..  PT Short Term Goals Week 1:  PT Short Term Goal 1 (Week 1): Patient will consistently perform sit to stand transfers with S PT Short Term Goal 1 - Progress (Week 1): Met PT Short Term Goal 2 (Week 1): Patient will demonstrate standing tolerance for 3 minutes during functional task. PT Short Term Goal 2 - Progress (Week 1): Progressing toward goal PT Short Term Goal 3 (Week 1): Patient will ambulate 53' with min A LRAD PT Short Term Goal 3 - Progress (Week 1): Met PT Short Term Goal 4 (Week 1): Patient will perform bed mobility with min A at least 50% of the time PT Short Term Goal 4 - Progress (Week 1): Not met Week 2:  PT Short Term Goal 1 (Week 2): Patient will ambulate 104' with rollator and S. PT Short Term Goal 2 (Week  2): Patient will negotiate 1 step with rollator and min A for home entry. PT Short Term Goal 3 (Week 2): Patient will demonstrate bed mobility with min A 50% of the time. PT Short Term Goal 4 (Week 2): Patient will tolerate 3 minutes standing for functional task with S with intermittent UE support.  Skilled Therapeutic Interventions/Progress Updates:    Patient in w/c reports feeling little better today, had oxycodone this morning at 9 am.  States worked with OT this morning and was able to have BM yesterday.  Patient pushed in w/c to dayroom.  Performed sit to stand S and ambulated 7' with rollator and close S to CGA.   Patient performed standing at hi lo table to complete peg design and tolerated 2 minutes prior to needing to sit.  Patient sit to stand to transfer with CGA to [employer].  Seated for 1.5 min x 2 at 40 cm/sec for LE strength and endurance.  Pt. C/o dizziness, noted BP 154/84, HR 62, SpO2 100% on RA.  States medication changed to oxycodone yesterday.  Patient sit to stand from [facility] and stand pivot to sit in w/c with RW and CGA.  Patient ambulated CGA x 16' with rollator.  Seated for LE strengthening with 3# hip flexion, LAQ and heel raises 2 x 10 reps each.  Assisted in w/c to room and left with belt alarm activated and needs within reach.  Therapy Documentation Precautions:  Precautions Precautions: Back Required Braces or  Orthoses: Spinal Brace Spinal Brace: Lumbar corset, Applied in sitting position Restrictions Weight Bearing Restrictions: No Pain: Pain Assessment Pain Scale: 0-10 Pain Score: 7  Pain Type: Chronic pain Pain Location: Back Pain Orientation: Right;Lower Pain Descriptors / Indicators: Aching Pain Frequency: Occasional Pain Onset: On-going Patients Stated Pain Goal: 0 Pain Intervention(s): Ambulation/increased activity;Heat applied   Therapy/Group: Individual Therapy  Reginia Naas  Magda Kiel, PT 02/21/2020, 10:10 AM

## 2020-02-21 NOTE — Progress Notes (Signed)
Occupational Therapy Session Note  Patient Details  Name: James [name]. MRN: [id number] Date of Birth: March 18, 1951  Today's Date: 02/21/2020 OT Individual Time: 2542-7062 and 1333-1430 OT Individual Time Calculation (min): 56 min and 57 min  Short Term Goals: Week 2:  OT Short Term Goal 1 (Week 2): Pt will complete toileting with mod assist using AE PRN OT Short Term Goal 2 (Week 2): Pt will complete bathing with AE with Min A or better OT Short Term Goal 3 (Week 2): Pt will perform LB dress with AE with Min A OT Short Term Goal 4 (Week 2): Pt will don back brace consistently with Supervision  Skilled Therapeutic Interventions/Progress Updates:    Pt greeted sitting up in the w/c while wearing his LSO, requesting to start session by shaving at the sink. Pt opted to engage in shaving and other grooming tasks w/c level due to increased Rt hip pain in standing at times, pt not wanting pain medicine to address at start of session. Pt completed shaving with vcs for problem solving when his electric razor ran out of battery. Provided him with a small mirror to compensate for visual deficits as well as he reported not being able to see well enough for symmetrical shave. Oral care and hair combing completed unassisted afterwards. OT donned his compression stockings and then cut gripper socks due to socks leaving indentations in skin. Pt used the extra wide sock aide to don gripper socks with min cuing for device use and mod cuing for adjusting socks using the reacher afterwards (given Min A as well). He requested to stand to help relieve hip pain, stated repositioning at times made it feel better. Sit<stand with rollator completed with CGA x2, pt locking brakes prior to standing on first stand but needing cuing for doing so before the second stand. OT assisted with adjusting pants and shirt in the back due to pts back precautions and inability to twist. He then returned to the w/c and opted to remain  sitting up. He still did not want pain medicine at this time but we discussed calling for RN 30 minutes before his next therapy if needed to maximize his participation. Reviewed his therapy schedule together. At end of session pt remained in the w/c, left with all needs within reach and safety belt fastened. Tx focus placed on activity tolerance, functional cognition, sit<stands, and AE training.   2nd Session 1:1 tx (57 min) Pt greeted in the w/c and premedicated for pain, declining shower due to feeling slightly dizzy earlier today. He wanted to complete bathing/dressing tasks w/c level at the sink sit<stand. Suggested trying B/D while sitting on the rollator seat however pt declined. Pt required supervision/cuing for UB bathing/dressing, Mod A for donning LSO using overhead technique with vcs. CGA for sit<stand for pt to complete perhygiene in the front. Assist needed for reaching buttocks. Pt used the reacher for threading LEs into pants, requiring Max A, vcs and encouragement to don pants at max level of independence. Pt would stop frequently when attempting and just say "I can't." He refused to wash his legs and feet. Next escorted pt to the w/c supply room and issued him with bilateral extending leg rests for LE edema mgt. Pt then self propelled the w/c ~65 and ~128ft bouts for UB strengthening, needing a few rest breaks due to fatigue and also OT assist to make his way back to the room. Pt remained sitting up in the w/c at close of session,  left with all needs within reach and safety belt fastened.    Therapy Documentation Precautions:  Precautions Precautions: Back Required Braces or Orthoses: Spinal Brace Spinal Brace: Lumbar corset, Applied in sitting position Restrictions Weight Bearing Restrictions: No Vital Signs: Therapy Vitals Pulse Rate: 60 BP: (!) 147/58 Pain: Pain Assessment Pain Scale: 0-10 Pain Score: 7  Pain Type: Chronic pain Pain Location: Back Pain Orientation:  Right;Lower Pain Descriptors / Indicators: Aching Pain Frequency: Occasional Pain Onset: On-going Patients Stated Pain Goal: 0 Pain Intervention(s): Ambulation/increased activity;Heat applied ADL: ADL Grooming: Setup Where Assessed-Grooming: Sitting at sink Upper Body Bathing: Setup, Supervision/safety Where Assessed-Upper Body Bathing: Edge of bed Lower Body Bathing: Maximal assistance Where Assessed-Lower Body Bathing: Edge of bed Upper Body Dressing: Moderate assistance Where Assessed-Upper Body Dressing: Edge of bed Lower Body Dressing: Maximal assistance Where Assessed-Lower Body Dressing: Edge of bed Toilet Transfer: Moderate assistance Toilet Transfer Method: Stand pivot Toilet Transfer Equipment: Bedside commode     Therapy/Group: Individual Therapy  Jeziah Kretschmer A Kaesha Kirsch 02/21/2020, 12:27 PM

## 2020-02-21 NOTE — Progress Notes (Signed)
[place] PHYSICAL MEDICINE & REHABILITATION PROGRESS NOTE   Subjective/Complaints:   Pt had large BM yesterday evening after Sorbitol.   Did take Dilaudid but around 2am- helped pain so could go back to sleep.  Took Oxy this AM- still having hip pain- No caths required so far with bladder scans.    ROS:   Pt denies SOB, abd pain, CP, N/V/C/D, and vision changes   Objective:   No results found. No results for input(s): WBC, HGB, HCT, PLT in the last 72 hours. No results for input(s): NA, K, CL, CO2, GLUCOSE, BUN, CREATININE, CALCIUM in the last 72 hours.  Intake/Output Summary (Last 24 hours) at 02/21/2020 0818 Last data filed at 02/21/2020 0724 Gross per 24 hour  Intake 838 ml  Output 875 ml  Net -37 ml        Physical Exam: Vital Signs Blood pressure 124/64, pulse 60, temperature 98.2 F (36.8 C), temperature source Oral, resp. rate 18, height 5\' 8"  (1.727 m), weight 102.9 kg, SpO2 95 %. Constitutional: No distress - sitting up in bedside chair, OT in room, NAD Eyes: EOMI. No discharge. Bruising on face- 95% resolved Cardiovascular: RRR Respiratory: CTA B/L- no W/R/R- good air movement GI: Soft, NT, ND, (+)BS  Skin: Warm and dry.  Intact. Incision looks great on lumbar- TTP over R low back/lateral aspect spine- steristrips intact. Still TTP Psych: appropriate Musc: Right lateral hip with mild tenderness.  Some edema in hands- hand edema chronic Neuro: Alert Motor: Bilateral upper extremities: 4+/5 proximal distal Right lower extremity: Hip flexion, knee extension, ankle dorsiflexion 4/5 Left lower extremity: Hip flexion, knee extension 4/5, ankle dorsiflexion 4 -/5  Assessment/Plan: 1. Functional deficits due to spinal stenosis, lumbar radiculopathy  per day of interdisciplinary therapy in a comprehensive inpatient rehab setting.  Physiatrist is providing close team supervision and 24 hour management of active medical problems listed below.  Physiatrist  and rehab team continue to assess barriers to discharge/monitor patient progress toward functional and medical goals  Care Tool:  Bathing    Body parts bathed by patient: Right arm, Left arm, Chest, Front perineal area, Abdomen, Left upper leg, Right upper leg, Right lower leg, Left lower leg, Face   Body parts bathed by helper: Buttocks     Bathing assist Assist Level: Minimal Assistance - Patient > 75%     Upper Body Dressing/Undressing Upper body dressing   What is the patient wearing?: Pull over shirt, Orthosis    Upper body assist Assist Level: Minimal Assistance - Patient > 75%    Lower Body Dressing/Undressing Lower body dressing      What is the patient wearing?: Pants     Lower body assist Assist for lower body dressing: Moderate Assistance - Patient 50 - 74%     Toileting Toileting    Toileting assist Assist for toileting: Moderate Assistance - Patient 50 - 74%     Transfers Chair/bed transfer  Transfers assist     Chair/bed transfer assist level: Minimal Assistance - Patient > 75%     Locomotion Ambulation   Ambulation assist      Assist level: Minimal Assistance - Patient > 75% Assistive device: Rollator Max distance: 12'   Walk 10 feet activity   Assist  Walk 10 feet activity did not occur: Safety/medical concerns  Assist level: Minimal Assistance - Patient > 75% Assistive device: Rollator   Walk 50 feet activity   Assist Walk 50 feet with 2 turns activity did not occur: Safety/medical  concerns         Walk 150 feet activity   Assist Walk 150 feet activity did not occur: Safety/medical concerns         Walk 10 feet on uneven surface  activity   Assist Walk 10 feet on uneven surfaces activity did not occur: Safety/medical concerns         Wheelchair     Assist Will patient use wheelchair at discharge?: No Type of Wheelchair: Manual    Wheelchair assist level: Supervision/Verbal cueing Max wheelchair  distance: 61'    Wheelchair 50 feet with 2 turns activity    Assist    Wheelchair 50 feet with 2 turns activity did not occur: Safety/medical concerns   Assist Level: Supervision/Verbal cueing   Wheelchair 150 feet activity     Assist  Wheelchair 150 feet activity did not occur: Safety/medical concerns       Blood pressure 124/64, pulse 60, temperature 98.2 F (36.8 C), temperature source Oral, resp. rate 18, height 5\' 8"  (1.727 m), weight 102.9 kg, SpO2 95 %.  Medical Problem List and Plan: 1.  Decreased functional mobility secondary to L3-4 4-5 spondylolisthesis arthropathy with radiculopathy/spinal stenosis.  Status post bilateral L3-4 4-5 laminotomy foraminotomies decompression interbody fusion 02/10/2020.  Back corset when out of bed 11/13- c/o "weaker" this AM after therapy yesterday- likely due to fatigue  11/16- getting better almost daily  11/19- moved to 15/7 with therapy due to R hip pain.   Continue CIR 2.  Antithrombotics: -DVT/anticoagulation: SCDs.    -will check with NSU when can add Lovenox  11/16- will start Lovenox- has been 8 days- no bleeding seen/H/H stable             -antiplatelet therapy: Plavix resumed 3. Pain Management: Flexeril as needed, Dilaudid 2-4 mg every 4 hours as needed pain  Lidoderm patch started on 11/15 for right lateral hip pain  11/16- can use icy hot or linament if patch isn't on.   11/17 - doesn't want to try prednisone or NSAIDs right now- I agree- will con't topicals and oral pain meds  11/18- had a good response with liniment wants to wait on oral meds.   11/19- changed dilaudid per wife's request to night time and changed to oxycodone 5-10 mg q4 hours during day. Says is helpful.  4. Mood: Provide emotional support             -antipsychotic agents: N/A 5. Neuropsych: This patient is capable of making decisions on his own behalf. 6. Skin/Wound Care: Routine skin checks 7. Fluids/Electrolytes/Nutrition: Routine in and  outs 8.  Acute on chronic anemia.    Hemoglobin 11.5 on 11/15 9.  CAD with CABG/ICD.  Amiodarone 200 mg twice daily, Imdur 30 mg daily, Toprol 25 mg daily 10  COPD.  Check oxygen saturations every shift 11.  CKD stage III with history of right nephrectomy. Has single L kidney  Baseline creatinine 1.47-1.50  Creatinine 1.37 on 11/15, improving  11/17- wait on Prednisone and won't do NSAIDs due to Cr issues  11/19- pt still wants to wait on NSAIDs/Prednisone 12.  Diabetes mellitus with hyperglycemia.  Hemoglobin A1c 5.9.  Currently on Tradjenta 5 mg daily.  Patient on Levemir 10 units daily prior to admission.  Resume as needed  11/19- BGs 104-142- con't regimen 13.  Chronic systolic congestive heart failure.  Entresto 49-51 mg twice daily, Lasix 40 mg twice daily.  Monitor for any signs of fluid overload. Filed Weights  02/13/20 1550 02/18/20 1547  Weight: 102 kg 102.9 kg   14.  Hypertension.  Monitor with increased mobility  Labile on 11/15, monitor for trend  09/03- BP 014 systolic- will wait to change- likely pain related  11/19- BP 124/82- con't regimen 15.  Hyperlipidemia.  Lipitor 16.  Hypothyroidism.  Synthroid 17.  BPH.  Flomax 0.4 mg daily.    Appears to be voiding 18. Insomnia- takes melatonin 6 mg QHS- will resume 19. Hypokalemia  Potassium 3.6 on 11/15 after supplementation 20.  Drug-induced constipation  Bowel meds increased on 11/15.   11/17- LBM Monday- will con't bowel meds for now.    11/18- will give sorbitol- at 2pm.   11/19- had good BM yesterday with sorbitol- con't regimen  LOS: 8 days A FACE TO FACE EVALUATION WAS PERFORMED  James [name] 02/21/2020, 8:18 AM

## 2020-02-22 DIAGNOSIS — M48061 Spinal stenosis, lumbar region without neurogenic claudication: Secondary | ICD-10-CM

## 2020-02-22 LAB — GLUCOSE, CAPILLARY
Glucose-Capillary: 122 mg/dL — ABNORMAL HIGH (ref 70–99)
Glucose-Capillary: 126 mg/dL — ABNORMAL HIGH (ref 70–99)
Glucose-Capillary: 92 mg/dL (ref 70–99)
Glucose-Capillary: 137 mg/dL — ABNORMAL HIGH (ref 70–99)

## 2020-02-22 NOTE — Progress Notes (Signed)
[place] PHYSICAL MEDICINE & REHABILITATION PROGRESS NOTE   Subjective/Complaints: No issues overnite, has had post hip pain since last Saturday, no pain shooting down the leg no numbness tingling in RLE  ROS:   Pt denies SOB, abd pain, CP, N/V/C/D, and vision changes   Objective:   No results found. No results for input(s): WBC, HGB, HCT, PLT in the last 72 hours. No results for input(s): NA, K, CL, CO2, GLUCOSE, BUN, CREATININE, CALCIUM in the last 72 hours.  Intake/Output Summary (Last 24 hours) at 02/22/2020 0759 Last data filed at 02/22/2020 0727 Gross per 24 hour  Intake 840 ml  Output 1125 ml  Net -285 ml        Physical Exam: Vital Signs Blood pressure (!) 164/71, pulse 60, temperature 98 F (36.7 C), temperature source Oral, resp. rate 18, height 5\' 8"  (1.727 m), weight 102.9 kg, SpO2 97 %.  General: No acute distress Mood and affect are appropriate Heart: Regular rate and rhythm no rubs murmurs or extra sounds Lungs: Clear to auscultation, breathing unlabored, no rales or wheezes Abdomen: Positive bowel sounds, soft nontender to palpation, nondistended Extremities: No clubbing, cyanosis, or edema Skin: No evidence of breakdown, no evidence of rash MSK- neg SLR, mild tenderness over the R PSIS  Neuro: Alert Motor: Bilateral upper extremities: 4+/5 proximal distal Right lower extremity: Hip flexion, knee extension, ankle dorsiflexion 4/5 Left lower extremity: Hip flexion, knee extension 4/5, ankle dorsiflexion 4 -/5  Assessment/Plan: 1. Functional deficits due to spinal stenosis, lumbar radiculopathy  per day of interdisciplinary therapy in a comprehensive inpatient rehab setting.  Physiatrist is providing close team supervision and 24 hour management of active medical problems listed below.  Physiatrist and rehab team continue to assess barriers to discharge/monitor patient progress toward functional and medical goals  Care Tool:  Bathing    Body  parts bathed by patient: Right arm, Left arm, Chest, Front perineal area, Abdomen, Left upper leg, Right upper leg, Right lower leg, Left lower leg, Face   Body parts bathed by helper: Buttocks     Bathing assist Assist Level: Minimal Assistance - Patient > 75%     Upper Body Dressing/Undressing Upper body dressing   What is the patient wearing?: Pull over shirt, Orthosis    Upper body assist Assist Level: Minimal Assistance - Patient > 75%    Lower Body Dressing/Undressing Lower body dressing      What is the patient wearing?: Pants     Lower body assist Assist for lower body dressing: Moderate Assistance - Patient 50 - 74%     Toileting Toileting    Toileting assist Assist for toileting: Moderate Assistance - Patient 50 - 74%     Transfers Chair/bed transfer  Transfers assist     Chair/bed transfer assist level: Contact Guard/Touching assist     Locomotion Ambulation   Ambulation assist      Assist level: Contact Guard/Touching assist Assistive device: Rollator Max distance: 33'   Walk 10 feet activity   Assist  Walk 10 feet activity did not occur: Safety/medical concerns  Assist level: Contact Guard/Touching assist Assistive device: Rollator   Walk 50 feet activity   Assist Walk 50 feet with 2 turns activity did not occur: Safety/medical concerns         Walk 150 feet activity   Assist Walk 150 feet activity did not occur: Safety/medical concerns         Walk 10 feet on uneven surface  activity  Assist Walk 10 feet on uneven surfaces activity did not occur: Safety/medical concerns         Wheelchair     Assist Will patient use wheelchair at discharge?: No Type of Wheelchair: Manual    Wheelchair assist level: Supervision/Verbal cueing Max wheelchair distance: 20'    Wheelchair 50 feet with 2 turns activity    Assist    Wheelchair 50 feet with 2 turns activity did not occur: Safety/medical  concerns   Assist Level: Supervision/Verbal cueing   Wheelchair 150 feet activity     Assist  Wheelchair 150 feet activity did not occur: Safety/medical concerns       Blood pressure (!) 164/71, pulse 60, temperature 98 F (36.7 C), temperature source Oral, resp. rate 18, height 5\' 8"  (1.727 m), weight 102.9 kg, SpO2 97 %.  Medical Problem List and Plan: 1.  Decreased functional mobility secondary to L3-4 4-5 spondylolisthesis arthropathy with radiculopathy/spinal stenosis.  Status post bilateral L3-4 4-5 laminotomy foraminotomies decompression interbody fusion 02/10/2020.  Back corset when out of bed 11/13- c/o "weaker" this AM after therapy yesterday- likely due to fatigue  11/16- getting better almost daily  11/19- moved to 15/7 with therapy due to R hip pain.   Continue CIR 2.  Antithrombotics: -DVT/anticoagulation: SCDs.      11/16- will start Lovenox- has been 8 days- no bleeding seen/H/H stable             -antiplatelet therapy: Plavix resumed 3. Pain Management: Flexeril as needed, Dilaudid 2-4 mg every 4 hours as needed pain  Oxy IR 10mg  x 2 yesterday with Hydromoprphone 2mg  last noc RIght post hip pain , likely sacroiliac- cont PT, topical analgesic  4. Mood: Provide emotional support             -antipsychotic agents: N/A 5. Neuropsych: This patient is capable of making decisions on his own behalf. 6. Skin/Wound Care: Routine skin checks 7. Fluids/Electrolytes/Nutrition: Routine in and outs 8.  Acute on chronic anemia.    Hemoglobin 11.5 on 11/15 9.  CAD with CABG/ICD.  Amiodarone 200 mg twice daily, Imdur 30 mg daily, Toprol 25 mg daily 10  COPD.  Check oxygen saturations every shift 11.  CKD stage III with history of right nephrectomy. Has single L kidney  Baseline creatinine 1.47-1.50  Creatinine 1.37 on 11/15, improving  11/17- wait on Prednisone and won't do NSAIDs due to Cr issues  11/19- pt still wants to wait on NSAIDs/Prednisone 12.  Diabetes mellitus  with hyperglycemia.  Hemoglobin A1c 5.9.  Currently on Tradjenta 5 mg daily.  Patient on Levemir 10 units daily prior to admission.  Resume as needed  11/19- BGs 104-142- con't regimen 13.  Chronic systolic congestive heart failure.  Entresto 49-51 mg twice daily, Lasix 40 mg twice daily.  Monitor for any signs of fluid overload. Filed Weights   02/13/20 1550 02/18/20 1547  Weight: 102 kg 102.9 kg   14.  Hypertension.  Monitor with increased mobility  Labile on 11/15, monitor for trend  60/04- BP 599 systolic- will wait to change- likely pain related  11/19- BP 124/82- con't regimen 15.  Hyperlipidemia.  Lipitor 16.  Hypothyroidism.  Synthroid 17.  BPH.  Flomax 0.4 mg daily.    Appears to be voiding 18. Insomnia- takes melatonin 6 mg QHS- will resume 19. Hypokalemia  Potassium 3.6 on 11/15 after supplementation 20.  Drug-induced constipation  Bowel meds increased on 11/15.   11/17- LBM Monday- will con't bowel meds for now.  11/18- will give sorbitol- at 2pm.   11/19- had good BM yesterday with sorbitol- con't regimen  LOS: 9 days A FACE TO FACE EVALUATION WAS PERFORMED  James [name] 02/22/2020, 7:59 AM

## 2020-02-23 ENCOUNTER — Inpatient Hospital Stay (HOSPITAL_COMMUNITY): Payer: Medicare Other | Admitting: Physical Therapy

## 2020-02-23 ENCOUNTER — Inpatient Hospital Stay (HOSPITAL_COMMUNITY): Payer: Medicare Other | Admitting: Occupational Therapy

## 2020-02-23 LAB — GLUCOSE, CAPILLARY
Glucose-Capillary: 105 mg/dL — ABNORMAL HIGH (ref 70–99)
Glucose-Capillary: 127 mg/dL — ABNORMAL HIGH (ref 70–99)
Glucose-Capillary: 166 mg/dL — ABNORMAL HIGH (ref 70–99)
Glucose-Capillary: 121 mg/dL — ABNORMAL HIGH (ref 70–99)

## 2020-02-23 NOTE — Progress Notes (Signed)
Physical Therapy Session Note  Patient Details  Name: James [name]. MRN: [id number] Date of Birth: 11/27/1950  Today's Date: 02/23/2020 PT Individual Time: 0915-1010; 1500-1540 PT Individual Time Calculation (min): 55 min and 40 min  Short Term Goals: Week 2:  PT Short Term Goal 1 (Week 2): Patient will ambulate 64' with rollator and S. PT Short Term Goal 2 (Week 2): Patient will negotiate 1 step with rollator and min A for home entry. PT Short Term Goal 3 (Week 2): Patient will demonstrate bed mobility with min A 50% of the time. PT Short Term Goal 4 (Week 2): Patient will tolerate 3 minutes standing for functional task with S with intermittent UE support.  Skilled Therapeutic Interventions/Progress Updates:    Session 1: Pt received seated in w/c in room, agreeable to PT session. Pt reports 8/10 pain in R SI joint/hip region that increases in standing and during gait. Pt reports being premedicated prior to start of therapy session and reports he has been using "horse linoment oil" topically for pain management, declines other intervention. Assisted pt with donning pants while seated in w/c. Sit to stand with min A to rollator, assist needed to pull pants up over hips. Ambulation 2 x 18 ft with rollator and CGA for balance before onset of pain too great requiring pt to sit for rest break. Pt requires increased time to complete stand pivot transfers and turn around with use of rollator and/or to sit down on rollator for rest break. Pt declines any further gait training this date due to pain. Pt also requesting regular leg rests on w/c as opposed to ELR due to discomfort with use of ELR. Attempted to adjust ELR for improved comfort but pt unable to tolerate them, traded out for regular leg rests. Remainder of session focus on bed mobility. Pt reports he utilizes a normal bed at home but uses a wedge and several pillows to remain in upright sitting position for comfort. Sit to supine on bed with  HOB elevated to simulate home environment, mod A for BLE management with heavy reliance on bedrails on both sides of hospital bed. Pt able to return to sitting EOB with CGA with use of bedrail. Pt would benefit from ongoing practice with bed mobility due to current assist level and use of adaptive equipment required. Pt left seated in w/c in room with needs in reach, quick release belt and chair alarm in place at end of session.  Session 2: Pt received seated in w/c in room, agreeable to PT session. Pt reports 7/10 pain in R hip, declines intervention. Sit to stand with min A initially to rollator progressing to close Supervision throughout session. Stand pivot transfer w/c to/from Nustep with rollator and CGA. Nustep level 3 x 5 min with use of BLE only for global endurance training. Pt reports increase in hip pain with Nustep and declines further activity on Nustep. Sit to stand 2 x 5 reps from mat table to rollator initially with CGA progressing to Supervision for BLE strengthening. Ambulation 2 x 10 ft with rollator and CGA for balance. Pt left seated in w/c in room with needs in reach, quick release belt and chair alarm in place at end of session.  Therapy Documentation Precautions:  Precautions Precautions: Back Required Braces or Orthoses: Spinal Brace Spinal Brace: Lumbar corset, Applied in sitting position Restrictions Weight Bearing Restrictions: No   Therapy/Group: Individual Therapy   Excell Seltzer, PT, DPT  02/23/2020, 12:09 PM

## 2020-02-23 NOTE — Progress Notes (Signed)
Patient awoke around 1:20 am with violent coughin spell able to cough up small amounts of sputum green and thick consistency. Vital signs stable PRN albuterol treatment administered. Patient requested to sit up in wheelchair at bedside to improve cough ability. Patient repositioned with seatbelt alarm applied and call bell in reach bedside fluids encouraged . No complaints of pain charge nurse notified. Resident returned to baseline at [facility]

## 2020-02-23 NOTE — Progress Notes (Signed)
[place] PHYSICAL MEDICINE & REHABILITATION PROGRESS NOTE   Subjective/Complaints:  Pt c/o QID bladder scans RIght buttocks pain a little better with the sportscream  ROS:   Pt denies SOB, abd pain, CP, N/V/C/D, and vision changes   Objective:   No results found. No results for input(s): WBC, HGB, HCT, PLT in the last 72 hours. No results for input(s): NA, K, CL, CO2, GLUCOSE, BUN, CREATININE, CALCIUM in the last 72 hours.  Intake/Output Summary (Last 24 hours) at 02/23/2020 0819 Last data filed at 02/23/2020 0709 Gross per 24 hour  Intake 720 ml  Output 1025 ml  Net -305 ml        Physical Exam: Vital Signs Blood pressure (!) 145/54, pulse 60, temperature 98.1 F (36.7 C), temperature source Oral, resp. rate 18, height 5\' 8"  (1.727 m), weight 102.9 kg, SpO2 99 %.   General: No acute distress Mood and affect are appropriate Heart: Regular rate and rhythm no rubs murmurs or extra sounds Lungs: Clear to auscultation, breathing unlabored, no rales or wheezes Abdomen: Positive bowel sounds, soft nontender to palpation, nondistended Extremities: No clubbing, cyanosis, or edema Skin: No evidence of breakdown, no evidence of rash   MSK- neg SLR, mild tenderness over the R PSIS  Neuro: Alert Motor: Bilateral upper extremities: 4+/5 proximal distal Right lower extremity: Hip flexion, knee extension, ankle dorsiflexion 4/5 Left lower extremity: Hip flexion, knee extension 4/5, ankle dorsiflexion 4 -/5  Assessment/Plan: 1. Functional deficits due to spinal stenosis, lumbar radiculopathy  per day of interdisciplinary therapy in a comprehensive inpatient rehab setting.  Physiatrist is providing close team supervision and 24 hour management of active medical problems listed below.  Physiatrist and rehab team continue to assess barriers to discharge/monitor patient progress toward functional and medical goals  Care Tool:  Bathing    Body parts bathed by patient:  Right arm, Left arm, Chest, Front perineal area, Abdomen, Left upper leg, Right upper leg, Right lower leg, Left lower leg, Face   Body parts bathed by helper: Buttocks     Bathing assist Assist Level: Minimal Assistance - Patient > 75%     Upper Body Dressing/Undressing Upper body dressing   What is the patient wearing?: Pull over shirt, Orthosis    Upper body assist Assist Level: Minimal Assistance - Patient > 75%    Lower Body Dressing/Undressing Lower body dressing      What is the patient wearing?: Pants     Lower body assist Assist for lower body dressing: Moderate Assistance - Patient 50 - 74%     Toileting Toileting    Toileting assist Assist for toileting: Moderate Assistance - Patient 50 - 74%     Transfers Chair/bed transfer  Transfers assist     Chair/bed transfer assist level: Contact Guard/Touching assist     Locomotion Ambulation   Ambulation assist      Assist level: Contact Guard/Touching assist Assistive device: Rollator Max distance: 33'   Walk 10 feet activity   Assist  Walk 10 feet activity did not occur: Safety/medical concerns  Assist level: Contact Guard/Touching assist Assistive device: Rollator   Walk 50 feet activity   Assist Walk 50 feet with 2 turns activity did not occur: Safety/medical concerns         Walk 150 feet activity   Assist Walk 150 feet activity did not occur: Safety/medical concerns         Walk 10 feet on uneven surface  activity   Assist Walk 10 feet  on uneven surfaces activity did not occur: Safety/medical concerns         Wheelchair     Assist Will patient use wheelchair at discharge?: No Type of Wheelchair: Manual    Wheelchair assist level: Supervision/Verbal cueing Max wheelchair distance: 20'    Wheelchair 50 feet with 2 turns activity    Assist    Wheelchair 50 feet with 2 turns activity did not occur: Safety/medical concerns   Assist Level:  Supervision/Verbal cueing   Wheelchair 150 feet activity     Assist  Wheelchair 150 feet activity did not occur: Safety/medical concerns       Blood pressure (!) 145/54, pulse 60, temperature 98.1 F (36.7 C), temperature source Oral, resp. rate 18, height 5\' 8"  (1.727 m), weight 102.9 kg, SpO2 99 %.  Medical Problem List and Plan: 1.  Decreased functional mobility secondary to L3-4 4-5 spondylolisthesis arthropathy with radiculopathy/spinal stenosis.  Status post bilateral L3-4 4-5 laminotomy foraminotomies decompression interbody fusion 02/10/2020.  Back corset when out of bed 11/13- c/o "weaker" this AM after therapy yesterday- likely due to fatigue   11/19- moved to 15/7 with therapy due to R hip pain.   Continue CIR 2.  Antithrombotics: -DVT/anticoagulation: SCDs.      11/16- will start Lovenox- has been 8 days- no bleeding seen/H/H stable             -antiplatelet therapy: Plavix resumed 3. Pain Management: Flexeril as needed, Dilaudid 2-4 mg every 4 hours as needed pain  Oxy IR 10mg  x 2 yesterday with Hydromoprphone 2mg  last noc RIght post hip pain , likely sacroiliac- cont PT, topical analgesic  4. Mood: Provide emotional support             -antipsychotic agents: N/A 5. Neuropsych: This patient is capable of making decisions on his own behalf. 6. Skin/Wound Care: Routine skin checks 7. Fluids/Electrolytes/Nutrition: Routine in and outs 8.  Acute on chronic anemia.    Hemoglobin 11.5 on 11/15 9.  CAD with CABG/ICD.  Amiodarone 200 mg twice daily, Imdur 30 mg daily, Toprol 25 mg daily 10  COPD.  Check oxygen saturations every shift 11.  CKD stage III with history of right nephrectomy. Has single L kidney  Baseline creatinine 1.47-1.50  Creatinine 1.37 on 11/15, improving  11/17- wait on Prednisone and won't do NSAIDs due to Cr issues  11/19- pt still wants to wait on NSAIDs/Prednisone 12.  Diabetes mellitus with hyperglycemia.  Hemoglobin A1c 5.9.  Currently on  Tradjenta 5 mg daily.  Patient on Levemir 10 units daily prior to admission.  Resume as needed  11/19- BGs 104-142- con't regimen 13.  Chronic systolic congestive heart failure.  Entresto 49-51 mg twice daily, Lasix 40 mg twice daily.  Monitor for any signs of fluid overload. Filed Weights   02/13/20 1550 02/18/20 1547  Weight: 102 kg 102.9 kg   14.  Hypertension.  Monitor with increased mobility     Vitals:   02/23/20 0136 02/23/20 0547  BP: (!) 155/67 (!) 145/54  Pulse: 86 60  Resp: (!) 22 18  Temp: 98.9 F (37.2 C) 98.1 F (36.7 C)  SpO2: 98% 99%  on lasix, entresto, toprol,systolic BP elevated at noc but not during the day, check orthostatic vitals 15.  Hyperlipidemia.  Lipitor 16.  Hypothyroidism.  Synthroid 17.  BPH.  Flomax 0.4 mg daily.    No sign of Urinary retention  will change bladder scan to daily 18. Insomnia- takes melatonin 6 mg QHS- will  resume 19. Hypokalemia  Potassium 3.6 on 11/15 after supplementation 20.  Drug-induced constipation  Bowel meds increased on 11/15.   11/17- LBM Monday- will con't bowel meds for now.    11/18- will give sorbitol- at 2pm.   11/19- had good BM yesterday with sorbitol- con't regimen  LOS: 10 days A FACE TO FACE EVALUATION WAS PERFORMED  Charlett Blake 02/23/2020, 8:19 AM

## 2020-02-23 NOTE — Progress Notes (Signed)
Occupational Therapy Session Note  Patient Details  Name: James [name]. MRN: [id number] Date of Birth: 08/12/1950  Today's Date: 02/23/2020 OT Individual Time: 1300-1356 OT Individual Time Calculation (min): 56 min   Short Term Goals: Week 2:  OT Short Term Goal 1 (Week 2): Pt will complete toileting with mod assist using AE PRN OT Short Term Goal 2 (Week 2): Pt will complete bathing with AE with Min A or better OT Short Term Goal 3 (Week 2): Pt will perform LB dress with AE with Min A OT Short Term Goal 4 (Week 2): Pt will don back brace consistently with Supervision  Skilled Therapeutic Interventions/Progress Updates:    Pt greeted in the w/c, premedicated for pain, requesting for therapy to be completed in the room because his daughter was coming to visit. Started by pt shaving at the sink with increased time and setup assistance. His other ADL needs were met, so transitioned to mobility training. Educated pt how to safely sit onto the rollator seat while adhering to his precautions when needing to rest during ambulation. He ambulated ~30 ft in a straight path down the hallway with close supervision-CGA, CGA when transferring onto the seat to rest. Pt refused to ambulate any further due to hip pain. Stand pivot<w/c completed with CGA and then he self propelled the w/c back to the room to work on UB strengthening and endurance. Due to soiled shirt, he doffed his shirt and then donned a clean one, min cuing for donning the back orthosis himself while adhering to precautions. Next guided him through gentle stretching of shoulders bilaterally in prep for UB exercises using the 2# bar. Pt with audible crepitus during ROM and stretches, pt reporting 7/10 pain when attempting to use the 2# bar, so just focused on UE ROM distally>proximally. Pt reported the he feels a great deal of shoulder pain due to his arthritis during tx but doesn't often tell therapy staff because he feels "too pushed." Pt also  reluctant to take pain medicine because he doesn't want to become "addicted to it." Discussed with both pt and dtr balancing holistic pain mgt interventions and also pain medicine from RN before therapy to maximize his participation. Also encouraged him to advocate for himself in regards to his physical limits in regards to therapy engagement. Pt appeared somewhat receptive. At end of session pt remained sitting in the w/c, left with all needs within reach and safety belt fastened.   Therapy Documentation Precautions:  Precautions Precautions: Back Required Braces or Orthoses: Spinal Brace Spinal Brace: Lumbar corset, Applied in sitting position Restrictions Weight Bearing Restrictions: No Vital Signs: Therapy Vitals Temp: 98.4 F (36.9 C) Pulse Rate: 60 Resp: 16 BP: 102/66 Patient Position (if appropriate): Lying Oxygen Therapy SpO2: 98 % O2 Device: Room Air Pain: Pain Assessment Pain Scale: 0-10 Pain Score: 5  Pain Type: Acute pain Pain Location: Shoulder Pain Orientation: Left Pain Descriptors / Indicators: Aching Pain Frequency: Occasional Pain Onset: On-going Patients Stated Pain Goal: 1 Pain Intervention(s): MD notified (Comment) ADL: ADL Grooming: Setup Where Assessed-Grooming: Sitting at sink Upper Body Bathing: Setup, Supervision/safety Where Assessed-Upper Body Bathing: Edge of bed Lower Body Bathing: Maximal assistance Where Assessed-Lower Body Bathing: Edge of bed Upper Body Dressing: Moderate assistance Where Assessed-Upper Body Dressing: Edge of bed Lower Body Dressing: Maximal assistance Where Assessed-Lower Body Dressing: Edge of bed Toilet Transfer: Moderate assistance Toilet Transfer Method: Stand pivot Toilet Transfer Equipment: Bedside commode      Therapy/Group: Individual Therapy  Marquay Kruse A Jacqueline Spofford 02/23/2020, 4:02 PM

## 2020-02-24 ENCOUNTER — Inpatient Hospital Stay (HOSPITAL_COMMUNITY): Payer: Medicare Other | Admitting: Occupational Therapy

## 2020-02-24 ENCOUNTER — Inpatient Hospital Stay (HOSPITAL_COMMUNITY): Payer: Medicare Other

## 2020-02-24 LAB — BASIC METABOLIC PANEL
Anion gap: 15 (ref 5–15)
BUN: 25 mg/dL — ABNORMAL HIGH (ref 8–23)
CO2: 27 mmol/L (ref 22–32)
Calcium: 9.5 mg/dL (ref 8.9–10.3)
Chloride: 103 mmol/L (ref 98–111)
Creatinine, Ser: 1.67 mg/dL — ABNORMAL HIGH (ref 0.61–1.24)
GFR, Estimated: 44 mL/min — ABNORMAL LOW (ref >60)
Glucose, Bld: 117 mg/dL — ABNORMAL HIGH (ref 70–99)
Potassium: 3.7 mmol/L (ref 3.5–5.1)
Sodium: 145 mmol/L (ref 135–145)

## 2020-02-24 LAB — CBC WITH DIFFERENTIAL/PLATELET
Abs Immature Granulocytes: 0.04 10*3/uL (ref 0.00–0.07)
Basophils Absolute: 0.1 10*3/uL (ref 0.0–0.1)
Basophils Relative: 1 %
Eosinophils Absolute: 0.1 10*3/uL (ref 0.0–0.5)
Eosinophils Relative: 1 %
HCT: 35.4 % — ABNORMAL LOW (ref 39.0–52.0)
Hemoglobin: 11.3 g/dL — ABNORMAL LOW (ref 13.0–17.0)
Immature Granulocytes: 1 %
Lymphocytes Relative: 18 %
Lymphs Abs: 1.5 10*3/uL (ref 0.7–4.0)
MCH: 29.4 pg (ref 26.0–34.0)
MCHC: 31.9 g/dL (ref 30.0–36.0)
MCV: 91.9 fL (ref 80.0–100.0)
Monocytes Absolute: 0.7 10*3/uL (ref 0.1–1.0)
Monocytes Relative: 8 %
Neutro Abs: 6.1 10*3/uL (ref 1.7–7.7)
Neutrophils Relative %: 71 %
Platelets: 347 10*3/uL (ref 150–400)
RBC: 3.85 MIL/uL — ABNORMAL LOW (ref 4.22–5.81)
RDW: 13.8 % (ref 11.5–15.5)
WBC: 8.5 10*3/uL (ref 4.0–10.5)
nRBC: 0 % (ref 0.0–0.2)

## 2020-02-24 LAB — GLUCOSE, CAPILLARY
Glucose-Capillary: 107 mg/dL — ABNORMAL HIGH (ref 70–99)
Glucose-Capillary: 111 mg/dL — ABNORMAL HIGH (ref 70–99)
Glucose-Capillary: 120 mg/dL — ABNORMAL HIGH (ref 70–99)
Glucose-Capillary: 155 mg/dL — ABNORMAL HIGH (ref 70–99)

## 2020-02-24 MED ORDER — SORBITOL 70 % SOLN
45.0000 mL | Freq: Once | Status: AC
  Administered 2020-02-24: 45 mL via ORAL
  Filled 2020-02-24: qty 60

## 2020-02-24 NOTE — Progress Notes (Signed)
[place] PHYSICAL MEDICINE & REHABILITATION PROGRESS NOTE   Subjective/Complaints:   Pain 7-8/10- tolerable per pt- of R hip and chronic L shoulder pain.  LBM 3 days ago.  Took pain at 6am.  No pain in back now.     ROS:   Pt denies SOB, abd pain, CP, N/V/C/D, and vision changes  Objective:   No results found. Recent Labs    02/24/20 0545  WBC 8.5  HGB 11.3*  HCT 35.4*  PLT 347   Recent Labs    02/24/20 0545  NA 145  K 3.7  CL 103  CO2 27  GLUCOSE 117*  BUN 25*  CREATININE 1.67*  CALCIUM 9.5    Intake/Output Summary (Last 24 hours) at 02/24/2020 0851 Last data filed at 02/24/2020 0750 Gross per 24 hour  Intake 360 ml  Output 850 ml  Net -490 ml        Physical Exam: Vital Signs Blood pressure (!) 161/75, pulse 66, temperature (!) 97.5 F (36.4 C), temperature source Oral, resp. rate 20, height 5\' 8"  (1.727 m), weight 102.9 kg, SpO2 99 %.   General: No acute distress- sitting up in bedside chair, appropriate, NAD Mood and affect are appropriate- bright affect Heart: RRR Lungs: CTA B/L- no W/R/R- good air movement Abdomen: Soft, NT, ND, (+)BS, hypoactive- doesn't appear distended  Extremities: No clubbing, cyanosis, or edema Skin: No evidence of breakdown, no evidence of rash MSK- neg SLR, mild tenderness over the R PSIS  Neuro: Alert- Ox3 Motor: Bilateral upper extremities: 4+/5 proximal distal Right lower extremity: Hip flexion, knee extension, ankle dorsiflexion 4/5 Left lower extremity: Hip flexion, knee extension 4/5, ankle dorsiflexion 4 -/5  Assessment/Plan: 1. Functional deficits due to spinal stenosis, lumbar radiculopathy  per day of interdisciplinary therapy in a comprehensive inpatient rehab setting.  Physiatrist is providing close team supervision and 24 hour management of active medical problems listed below.  Physiatrist and rehab team continue to assess barriers to discharge/monitor patient progress toward functional and  medical goals  Care Tool:  Bathing    Body parts bathed by patient: Right arm, Left arm, Chest, Front perineal area, Abdomen, Left upper leg, Right upper leg, Right lower leg, Left lower leg, Face   Body parts bathed by helper: Buttocks     Bathing assist Assist Level: Minimal Assistance - Patient > 75%     Upper Body Dressing/Undressing Upper body dressing   What is the patient wearing?: Pull over shirt, Orthosis    Upper body assist Assist Level: Minimal Assistance - Patient > 75%    Lower Body Dressing/Undressing Lower body dressing      What is the patient wearing?: Pants     Lower body assist Assist for lower body dressing: Moderate Assistance - Patient 50 - 74%     Toileting Toileting    Toileting assist Assist for toileting: Moderate Assistance - Patient 50 - 74%     Transfers Chair/bed transfer  Transfers assist     Chair/bed transfer assist level: Contact Guard/Touching assist     Locomotion Ambulation   Ambulation assist      Assist level: Contact Guard/Touching assist Assistive device: Rollator Max distance: 18'   Walk 10 feet activity   Assist  Walk 10 feet activity did not occur: Safety/medical concerns  Assist level: Contact Guard/Touching assist Assistive device: Rollator   Walk 50 feet activity   Assist Walk 50 feet with 2 turns activity did not occur: Safety/medical concerns  Walk 150 feet activity   Assist Walk 150 feet activity did not occur: Safety/medical concerns         Walk 10 feet on uneven surface  activity   Assist Walk 10 feet on uneven surfaces activity did not occur: Safety/medical concerns         Wheelchair     Assist Will patient use wheelchair at discharge?: No Type of Wheelchair: Manual    Wheelchair assist level: Supervision/Verbal cueing Max wheelchair distance: 20'    Wheelchair 50 feet with 2 turns activity    Assist    Wheelchair 50 feet with 2 turns  activity did not occur: Safety/medical concerns   Assist Level: Supervision/Verbal cueing   Wheelchair 150 feet activity     Assist  Wheelchair 150 feet activity did not occur: Safety/medical concerns       Blood pressure (!) 161/75, pulse 66, temperature (!) 97.5 F (36.4 C), temperature source Oral, resp. rate 20, height 5\' 8"  (1.727 m), weight 102.9 kg, SpO2 99 %.  Medical Problem List and Plan: 1.  Decreased functional mobility secondary to L3-4 4-5 spondylolisthesis arthropathy with radiculopathy/spinal stenosis.  Status post bilateral L3-4 4-5 laminotomy foraminotomies decompression interbody fusion 02/10/2020.  Back corset when out of bed 11/13- c/o "weaker" this AM after therapy yesterday- likely due to fatigue   11/19- moved to 15/7 with therapy due to R hip pain.   Continue CIR   2.  Antithrombotics: -DVT/anticoagulation: SCDs.      11/16- will start Lovenox- has been 8 days- no bleeding seen/H/H stable             -antiplatelet therapy: Plavix resumed 3. Pain Management: Flexeril as needed, Dilaudid 2-4 mg every 4 hours as needed pain  Oxy IR 10mg  x 2 yesterday with Hydromoprphone 2mg  last noc RIght post hip pain , likely sacroiliac- cont PT, topical analgesic   11/22- pain being basically controlled with pain meds, to make things "tolerable" per pt- oxy is working, but he had been concerned it wouldn't.  4. Mood: Provide emotional support             -antipsychotic agents: N/A 5. Neuropsych: This patient is capable of making decisions on his own behalf. 6. Skin/Wound Care: Routine skin checks 7. Fluids/Electrolytes/Nutrition: Routine in and outs 8.  Acute on chronic anemia.    Hemoglobin 11.5 on 11/15 9.  CAD with CABG/ICD.  Amiodarone 200 mg twice daily, Imdur 30 mg daily, Toprol 25 mg daily 10  COPD.  Check oxygen saturations every shift 11.  CKD stage III with history of right nephrectomy. Has single L kidney  Baseline creatinine 1.47-1.50  Creatinine 1.37 on  11/15, improving  11/17- wait on Prednisone and won't do NSAIDs due to Cr issues  11/19- pt still wants to wait on NSAIDs/Prednisone  11/22- Cr up to 1.67- on Lasix 40 mg BID and Entresto- both could make him have higher Cr if already up- will recheck Wednesday and push fluids.  12.  Diabetes mellitus with hyperglycemia.  Hemoglobin A1c 5.9.  Currently on Tradjenta 5 mg daily.  Patient on Levemir 10 units daily prior to admission.  Resume as needed  11/22- BGs 107-166- con't regimen 13.  Chronic systolic congestive heart failure.  Entresto 49-51 mg twice daily, Lasix 40 mg twice daily.  Monitor for any signs of fluid overload. Filed Weights   02/13/20 1550 02/18/20 1547  Weight: 102 kg 102.9 kg   14.  Hypertension.  Monitor with increased mobility  Vitals:   02/23/20 1920 02/24/20 0448  BP: 129/70 (!) 161/75  Pulse: (!) 59 66  Resp: 18 20  Temp: 98.4 F (36.9 C) (!) 97.5 F (36.4 C)  SpO2: 97% 99%  on lasix, entresto, toprol,systolic BP elevated at noc but not during the day, check orthostatic vitals 15.  Hyperlipidemia.  Lipitor 16.  Hypothyroidism.  Synthroid 17.  BPH.  Flomax 0.4 mg daily.    No sign of Urinary retention  will change bladder scan to daily 18. Insomnia- takes melatonin 6 mg QHS- will resume 19. Hypokalemia  Potassium 3.6 on 11/15 after supplementation 20.  Drug-induced constipation  Bowel meds increased on 11/15.   11/17- LBM Monday- will con't bowel meds for now.    11/18- will give sorbitol- at 2pm.   11/19- had good BM yesterday with sorbitol- con't regimen  11/22- LBM 3 days ago- will give Sorbitol 45 mg x1  LOS: 11 days A FACE TO FACE EVALUATION WAS PERFORMED  Seraphim Trow 02/24/2020, 8:51 AM

## 2020-02-24 NOTE — Progress Notes (Signed)
Physical Therapy Session Note  Patient Details  Name: James [name]. MRN: [id number] Date of Birth: April 30, 1950  Today's Date: 02/24/2020 PT Individual Time: 1050-1200 PT Individual Time Calculation (min): 70 min   Short Term Goals: Week 2:  PT Short Term Goal 1 (Week 2): Patient will ambulate 23' with rollator and S. PT Short Term Goal 2 (Week 2): Patient will negotiate 1 step with rollator and min A for home entry. PT Short Term Goal 3 (Week 2): Patient will demonstrate bed mobility with min A 50% of the time. PT Short Term Goal 4 (Week 2): Patient will tolerate 3 minutes standing for functional task with S with intermittent UE support.  Skilled Therapeutic Interventions/Progress Updates:    Patient in w/c in room.  Reports using horse liniment on his hip and still achy.  Pushed in w/c to dayroom.  Sit to stand with S from w/c ambulated with CGA 13' to armchair, increased time turning with cues to sit.  Sit to stand from armchair CGA due to lower seat ambulated to w/c walking around table in the way with close S, then CGA for turn to sit.  Ambulated around 2 bowling pins with rollator and CGA turning around and walking back to w/c.  Reports bothered his hip more.  After seated reset performed second time w/ CGA throughout.  Patient seated in w/c for Kinetron LE's @ 30 cm/sec x 2 minutes x 2 reps.  Patient propelled w/c x 50' with S in dayroom around obstacles.  Pushed to room in w/c for efficiency and stand step to bed with rollator and CGA.  Sit to supine using leg lifter after demonstration with min A.  Patient in supine for therex as noted below.  Supine to sit with min A for trunk upright.  Patient sit to stand x 5 from EOB cues for hand placement with CGA to S.  Performed stand step to w/c with CGA.  Left seated with alarm belt activated and needs within reach.  Therapy Documentation Precautions:  Precautions Precautions: Back Required Braces or Orthoses: Spinal Brace Spinal Brace:  Lumbar corset, Applied in sitting position Restrictions Weight Bearing Restrictions: No Pain: Pain Assessment Pain Score: 7  Pain Type: Acute pain Pain Location: Hip Pain Orientation: Right Pain Descriptors / Indicators: Aching Pain Onset: On-going Pain Intervention(s): Repositioned Exercises: Total Joint Exercises Ankle Circles/Pumps: AROM;Both;10 reps;Supine Short Arc Quad: Both;10 reps;Supine Heel Slides: Both;10 reps;Supine Hip ABduction/ADduction: AROM;Both;10 reps;Supine Bridges: Strengthening;Both;5 reps;Supine (with legs flexed over pillows)    Therapy/Group: Individual Therapy  Reginia Naas  [place], PT 02/24/2020, 12:09 PM

## 2020-02-24 NOTE — Progress Notes (Signed)
Occupational Therapy Session Note  Patient Details  Name: Adebayo Ensminger. MRN: [id number] Date of Birth: 07-15-1950  Today's Date: 02/24/2020 OT Individual Time: 7341-9379 and 0240-9735 OT Individual Time Calculation (min): 56 min and 41 min   Short Term Goals: Week 2:  OT Short Term Goal 1 (Week 2): Pt will complete toileting with mod assist using AE PRN OT Short Term Goal 2 (Week 2): Pt will complete bathing with AE with Min A or better OT Short Term Goal 3 (Week 2): Pt will perform LB dress with AE with Min A OT Short Term Goal 4 (Week 2): Pt will don back brace consistently with Supervision   Skilled Therapeutic Interventions/Progress Updates:    Pt greeted at time of session sitting up in wheelchair agreeable to OT session, mild hip discomfort which has been ongoing, rest breaks provided throughout. Declined ADL stating he had on clean clothes and did not need to toilet but did want to brush teeth, self propelled to sink with Supervision and extended time, oral hygiene set up. Pt partially transported to gym before stating he needed to use the urinal (this is what he uses at home), transported back to room where he had continent void of urine in urinal with Min A to help maneuver urinal into place. Transported back to gym via wheelchair and walked short distance to mat approx 15 feet <> with CGA with rollator. Performed ROM techniques for BUEs with focus at shoulders d/t arthritic complaints for shoulder rolls, scapular retractions, and attempted 2# weighted exercises but activity terminated d/t discomfort. Dynamic standing task at mirror with unilateral support to reach out of BOS and hit numbers 1-10, break half way through before resuming. Transported back to room via wheelchair and set up with alarm on, call bell in reach.   Session 2: Pt greeted at time of session sitting up in wheelchair agreeable to OT session, no pain initially but pt later on in session with 7/10 pain in L  shoulder with some more strenuous activity which was terminated and rest breaks provided as needed. Pt declined ADL, stating he had been to bathroom today for BM and was very tired from this. Brought to gym via wheelchair for time management and set up at [facility], performed on level 1 for approx 3 minutes before stating shoulder pain was too great, walked short distance to Nustep CGA with rollator, transferred in same manner and performed BLEs only for approx 3 minutes before complaining of severe R hip pain and requesting another activity. Dynamic seated activity at mat level 2x20 hits for large beach ball to improve core strength and reaction speed. Note pt requires extended time for all tasks. Transported back to room via wheelchair in same manner as above, set up with alarm on, call bell in reach, and wife nancy in room.   Therapy Documentation Precautions:  Precautions Precautions: Back Required Braces or Orthoses: Spinal Brace Spinal Brace: Lumbar corset, Applied in sitting position Restrictions Weight Bearing Restrictions: No     Therapy/Group: Individual Therapy  Viona Gilmore 02/24/2020, 12:56 PM

## 2020-02-25 ENCOUNTER — Inpatient Hospital Stay (HOSPITAL_COMMUNITY): Payer: Medicare Other | Admitting: Occupational Therapy

## 2020-02-25 ENCOUNTER — Inpatient Hospital Stay (HOSPITAL_COMMUNITY): Payer: Medicare Other

## 2020-02-25 LAB — GLUCOSE, CAPILLARY
Glucose-Capillary: 117 mg/dL — ABNORMAL HIGH (ref 70–99)
Glucose-Capillary: 162 mg/dL — ABNORMAL HIGH (ref 70–99)
Glucose-Capillary: 104 mg/dL — ABNORMAL HIGH (ref 70–99)
Glucose-Capillary: 135 mg/dL — ABNORMAL HIGH (ref 70–99)

## 2020-02-25 NOTE — Progress Notes (Signed)
Patient ID: James [name]., male   DOB: 03/12/1951, 68 y.o.   MRN: [id number]  Met with pt and called wife to discuss team conference progress toward his goals and discharge still 12/1. Wife wants same home health agency due to knows PT grew up [place] the [place] frmm them, may ask for another RN. Scheduled family education for Monday 11/29 at 1:00-3:00 pm. Will let team know.

## 2020-02-25 NOTE — Progress Notes (Signed)
[place] PHYSICAL MEDICINE & REHABILITATION PROGRESS NOTE   Subjective/Complaints:   Pt reports pain in R hip/L shoulder "the same" and tolerable.   Was cathed this AM- said they cathed him ~ 0600-  Was for 348 cc.   Slept well- Sorbitol worked well and had 3 good BMs yesterday.    ROS:   Pt denies SOB, abd pain, CP, N/V/C/D, and vision changes   Objective:   No results found. Recent Labs    02/24/20 0545  WBC 8.5  HGB 11.3*  HCT 35.4*  PLT 347   Recent Labs    02/24/20 0545  NA 145  K 3.7  CL 103  CO2 27  GLUCOSE 117*  BUN 25*  CREATININE 1.67*  CALCIUM 9.5    Intake/Output Summary (Last 24 hours) at 02/25/2020 0852 Last data filed at 02/25/2020 0700 Gross per 24 hour  Intake 960 ml  Output 900 ml  Net 60 ml        Physical Exam: Vital Signs Blood pressure (!) 146/72, pulse 68, temperature 98.1 F (36.7 C), resp. rate 18, height 5\' 8"  (1.727 m), weight 102.9 kg, SpO2 95 %.   General: sitting up in bedside chair, wearing LSO brace, NAD Mood and affect are appropriate- bright affect except about cath Heart: RRR Lungs: CTA B/L- no W/R/R- good air movement Abdomen: Soft, NT, ND, (+)BS  Extremities: No clubbing, cyanosis, or edema Skin: No evidence of breakdown, no evidence of rash MSK- neg SLR, mild tenderness over the R PSIS  Neuro: Alert- Ox3 Motor: Bilateral upper extremities: 4+/5 proximal distal Right lower extremity: Hip flexion, knee extension, ankle dorsiflexion 4/5 Left lower extremity: Hip flexion, knee extension 4/5, ankle dorsiflexion 4 -/5  Assessment/Plan: 1. Functional deficits due to spinal stenosis, lumbar radiculopathy  per day of interdisciplinary therapy in a comprehensive inpatient rehab setting.  Physiatrist is providing close team supervision and 24 hour management of active medical problems listed below.  Physiatrist and rehab team continue to assess barriers to discharge/monitor patient progress toward functional  and medical goals  Care Tool:  Bathing    Body parts bathed by patient: Right arm, Left arm, Chest, Front perineal area, Abdomen, Left upper leg, Right upper leg, Right lower leg, Left lower leg, Face   Body parts bathed by helper: Buttocks     Bathing assist Assist Level: Minimal Assistance - Patient > 75%     Upper Body Dressing/Undressing Upper body dressing   What is the patient wearing?: Pull over shirt, Orthosis    Upper body assist Assist Level: Minimal Assistance - Patient > 75%    Lower Body Dressing/Undressing Lower body dressing      What is the patient wearing?: Pants     Lower body assist Assist for lower body dressing: Moderate Assistance - Patient 50 - 74%     Toileting Toileting    Toileting assist Assist for toileting: Moderate Assistance - Patient 50 - 74%     Transfers Chair/bed transfer  Transfers assist     Chair/bed transfer assist level: Contact Guard/Touching assist     Locomotion Ambulation   Ambulation assist      Assist level: Contact Guard/Touching assist Assistive device: Rollator Max distance: 26'   Walk 10 feet activity   Assist  Walk 10 feet activity did not occur: Safety/medical concerns  Assist level: Contact Guard/Touching assist Assistive device: Rollator   Walk 50 feet activity   Assist Walk 50 feet with 2 turns activity did not occur: Safety/medical  concerns         Walk 150 feet activity   Assist Walk 150 feet activity did not occur: Safety/medical concerns         Walk 10 feet on uneven surface  activity   Assist Walk 10 feet on uneven surfaces activity did not occur: Safety/medical concerns         Wheelchair     Assist Will patient use wheelchair at discharge?: No Type of Wheelchair: Manual    Wheelchair assist level: Supervision/Verbal cueing Max wheelchair distance: 15'    Wheelchair 50 feet with 2 turns activity    Assist    Wheelchair 50 feet with 2 turns  activity did not occur: Safety/medical concerns   Assist Level: Supervision/Verbal cueing   Wheelchair 150 feet activity     Assist  Wheelchair 150 feet activity did not occur: Safety/medical concerns       Blood pressure (!) 146/72, pulse 68, temperature 98.1 F (36.7 C), resp. rate 18, height 5\' 8"  (1.727 m), weight 102.9 kg, SpO2 95 %.  Medical Problem List and Plan: 1.  Decreased functional mobility secondary to L3-4 4-5 spondylolisthesis arthropathy with radiculopathy/spinal stenosis.  Status post bilateral L3-4 4-5 laminotomy foraminotomies decompression interbody fusion 02/10/2020.  Back corset when out of bed  11/19- moved to 15/7 with therapy due to R hip pain.   Continue CIR 2.  Antithrombotics: -DVT/anticoagulation: SCDs.    11/16- will start Lovenox- has been 8 days- no bleeding seen/H/H stable  11/23- will not go home on Lovenox             -antiplatelet therapy: Plavix resumed 3. Pain Management: Flexeril as needed, Dilaudid 2-4 mg every 4 hours as needed pain  Oxy IR 10mg  x 2 yesterday with Hydromoprphone 2mg  last noc RIght post hip pain , likely sacroiliac- cont PT, topical analgesic   11/22- pain being basically controlled with pain meds, to make things "tolerable" per pt- oxy is working, but he had been concerned it wouldn't.   11/23- pain tolerable- con't regimen- wil see if can get switched over to Oxy by itself by d/c.  4. Mood: Provide emotional support             -antipsychotic agents: N/A 5. Neuropsych: This patient is capable of making decisions on his own behalf. 6. Skin/Wound Care: Routine skin checks 7. Fluids/Electrolytes/Nutrition: Routine in and outs 8.  Acute on chronic anemia.    Hemoglobin 11.5 on 11/15 9.  CAD with CABG/ICD.  Amiodarone 200 mg twice daily, Imdur 30 mg daily, Toprol 25 mg daily 10  COPD.  Check oxygen saturations every shift 11.  CKD stage III with history of right nephrectomy. Has single L kidney  Baseline creatinine  1.47-1.50  Creatinine 1.37 on 11/15, improving  11/17- wait on Prednisone and won't do NSAIDs due to Cr issues  11/19- pt still wants to wait on NSAIDs/Prednisone  11/22- Cr up to 1.67- on Lasix 40 mg BID and Entresto- both could make him have higher Cr if already up- will recheck Wednesday and push fluids.  12.  Diabetes mellitus with hyperglycemia.  Hemoglobin A1c 5.9.  Currently on Tradjenta 5 mg daily.  Patient on Levemir 10 units daily prior to admission.  Resume as needed  111/23- BGs 107-155- con't regimen 13.  Chronic systolic congestive heart failure.  Entresto 49-51 mg twice daily, Lasix 40 mg twice daily.  Monitor for any signs of fluid overload. Filed Weights   02/13/20 1550 02/18/20 1547  Weight: 102 kg 102.9 kg   11/23- reordered daily weights 14.  Hypertension.  Monitor with increased mobility     Vitals:   02/24/20 1919 02/25/20 0507  BP: 123/65 (!) 146/72  Pulse: (!) 59 68  Resp: 18 18  Temp: 97.7 F (36.5 C) 98.1 F (36.7 C)  SpO2: 99% 95%  on lasix, entresto, toprol,systolic BP elevated at noc but not during the day, check orthostatic vitals  11/23- BP 140/90 this AM, but usually 120s/60s- con't regimen 15.  Hyperlipidemia.  Lipitor 16.  Hypothyroidism.  Synthroid 17.  BPH.  Flomax 0.4 mg daily.    No sign of Urinary retention  will change bladder scan to daily  11/23- pt required cath this AM, but said they didn't let him void prior- at 6am- asked me to stop bladder scans- will do so, but explained if DOESN'T void x 8 hrs, will need cath- he agreed 18. Insomnia- takes melatonin 6 mg QHS- will resume 19. Hypokalemia  Potassium 3.6 on 11/15 after supplementation 20.  Drug-induced constipation  Bowel meds increased on 11/15.   11/23- LBM yesterday  LOS: 12 days A FACE TO FACE EVALUATION WAS PERFORMED  Tamma Brigandi 02/25/2020, 8:52 AM

## 2020-02-25 NOTE — Plan of Care (Signed)
  Problem: Consults Goal: RH SPINAL CORD INJURY PATIENT EDUCATION Description:  See Patient Education module for education specifics.  Outcome: Progressing

## 2020-02-25 NOTE — Progress Notes (Signed)
Physical Therapy Session Note  Patient Details  Name: James [name]. MRN: [id number] Date of Birth: 1950/10/24  Today's Date: 02/25/2020 PT Individual Time: 1000-1045 PT Individual Time Calculation (min): 45 min   Short Term Goals: Week 2:  PT Short Term Goal 1 (Week 2): Patient will ambulate 78' with rollator and S. PT Short Term Goal 2 (Week 2): Patient will negotiate 1 step with rollator and min A for home entry. PT Short Term Goal 3 (Week 2): Patient will demonstrate bed mobility with min A 50% of the time. PT Short Term Goal 4 (Week 2): Patient will tolerate 3 minutes standing for functional task with S with intermittent UE support.  Skilled Therapeutic Interventions/Progress Updates:    Patient in w/c reports had small adjustment to brace yesterday, but no difference really.  Patient in w/c propelled x 38' with S.  Increased shoulder pain so assisted rest of the way to ortho gym.  Patient performed sit to stand with S, ambulated to car and performed transfer with min A and increased time pt manually helping L LE into car.  Patient negotiated ramp x 20' with rollator and CGA.  Patient assisted in w/c to general gym.  Sit to stand S and used rail in hallway for side stepping 2 x 20'.  Standing mini squats x 10, then rocking ant/post with hips for weight shift and trunk extension while holding rail x 10.  At 3" steps holding rails while alternating step taps to step with CGA.  Patient ambulated x 25' with rollator and CGA.  Reported R hip really painful and giving away.  Assisted to room in w/c and left seated with seat belt alarm and all needs in reach.  Therapy Documentation Precautions:  Precautions Precautions: Back Required Braces or Orthoses: Spinal Brace Spinal Brace: Lumbar corset, Applied in sitting position Restrictions Weight Bearing Restrictions: No Pain: Pain Assessment Pain Score: 7  Pain Type: Acute pain Pain Location: Hip Pain Orientation: Right;Lower Pain  Descriptors / Indicators: Aching Pain Onset: On-going Pain Intervention(s): RN made aware       Therapy/Group: Individual Therapy  Reginia Naas  Magda Kiel, PT 02/25/2020, 10:36 AM

## 2020-02-25 NOTE — Progress Notes (Signed)
Occupational Therapy Session Note  Patient Details  Name: James [name]. MRN: [id number] Date of Birth: 05-29-1950  Today's Date: 02/25/2020 OT Individual Time: 9485-4627 and 1420-1530 OT Individual Time Calculation (min): 58 min and 70 min   Short Term Goals: Week 2:  OT Short Term Goal 1 (Week 2): Pt will complete toileting with mod assist using AE PRN OT Short Term Goal 2 (Week 2): Pt will complete bathing with AE with Min A or better OT Short Term Goal 3 (Week 2): Pt will perform LB dress with AE with Min A OT Short Term Goal 4 (Week 2): Pt will don back brace consistently with Supervision   Skilled Therapeutic Interventions/Progress Updates:    Pt greeted at time of session sitting up in wheelchair agreeable to OT session, mild pain in R hip but tolerable, rest breaks provided as needed throughout. Pt declined ADL, in favor of performing shower this afternoon. Pt stating he had been in compression socks all night again, therapist assist with removing compression stockings for short break before reapplying before ambulating, dependent to don compression socks. Donned gripper socks with sock aide, Supervision. Self propel to sink level to brush teeth with set up, continues to have difficulty locating toothbrush/toothepaste on R corner of sink despite it being in same place most mornings. Adjusted brace before transporting pt to gym via wheelchair for time management, walked short distance wheelchair <> mat approx 15 feet with rollator with CGA. Dynamic seated activity with 1# dowel with cues to prevent shoulder elevation/compensatory movements and prevent discomfort from arthritic shoulders, performed for 3x20 hits for both ROM benefits and core strengthening. Walked approx 25 feet from mat into hall before sitting and transported dependently back to room via wheelchair for time, set up with alarm on ,call bell in reach.   Session 2: Pt greeted at time of session sitting up in wheelchair at  time of session agreeable to OT session, wanting to take a shower. Ambulated to/from bathroom with CGA with rollator and transferred to shower bench in same manner simulating as if in a tub/shower. Doffed pants in standing with using reacher to get off feet in sitting, doffed brace and shirt with supervision. Ensured back surgical sight was covered and waterproof before pt performed UB bathing with supervision and LB bathing with Min A, only needing assist for washing buttocks in standing and LHS for feet. Pt did require use of grab bars for sit to stands and support throughout shower, did well with remembering back precautions, and did need frequent cues for problem solving through a new environment. Dried off in same manner, assist to dry off feet. Donned shirt in sitting with Supervision, brace with Min A for tightness and problem solving. Pt turned to face out side of shower before donning pants with Mod A, help to thread L foot with use of reacher for remainder of task and help to don over R hip in standing before walking back to wheelchair with rollator CGA. Pt up in chair with alarm on, call bell in reach.   Therapy Documentation Precautions:  Precautions Precautions: Back Required Braces or Orthoses: Spinal Brace Spinal Brace: Lumbar corset, Applied in sitting position Restrictions Weight Bearing Restrictions: No     Therapy/Group: Individual Therapy  Viona Gilmore 02/25/2020, 12:13 PM

## 2020-02-25 NOTE — Progress Notes (Addendum)
Nutrition Follow-up  RD working remotely.  DOCUMENTATION CODES:   Obesity unspecified  INTERVENTION:   - Please obtain updated weight, last weight is from 02/18/20  -Continue Ensure Enlive po BID, each supplement provides 350 kcal and 20 grams of protein (please provide chocolate flavor per pt request)  - Encourage adequate PO intake  NUTRITION DIAGNOSIS:   Increased nutrient needs related to post-op healing, chronic illness (COPD, CHF) as evidenced by estimated needs.  Ongoing  GOAL:   Patient will meet greater than or equal to 90% of their needs  Progressing  MONITOR:   PO intake, Supplement acceptance, Labs, Weight trends, Skin, I & O's  REASON FOR ASSESSMENT:   Malnutrition Screening Tool    ASSESSMENT:   69 year old male with PMH of CKD stage III, CAD, NSTEMI, s/p CABG/ICD in 2015, CHF, right nephrectomy 2019, COPD, DM, HTN, HLD. Presented 02/10/20 with progressive low back pain. MRI demonstrated L3-4 and L4-5 spinal listhesis facet arthropathy spinal stenosis with radiculopathy. Pt underwent bilateral L3-4 and 4-5 laminotomy foraminotomy to decompress bilateral L3-4-5 nerve roots with posterior lumbar interbody fusion as well as transforaminal lumbar interbody fusion on 02/10/20. Admitted to CIR on 11/11.  Target d/c date remains 03/04/20.  No new weights since 11/16. Recommend obtaining updated weight.  Unable to reach pt via phone call to room. Most meal completions have been 100%. Pt accepting ~50% of Ensure Enlive supplements per [facility] documentation. Will continue with current supplement regimen.  Meal Completion: 0-100% (most meal completions 100%)  Medications reviewed and include: colace, Ensure Enlive BID, lasix, SSI, tradjenta, miralax, Senna, vitamin D weekly  Labs reviewed: BUN 25, creatinine 1.67 CBG's: 117-162 x 24 hours  UOP: 1000 ml x 24 hours  Diet Order:   Diet Order            Diet Carb Modified Fluid consistency: Thin; Room service  appropriate? Yes  Diet effective now                 EDUCATION NEEDS:   Education needs have been addressed  Skin:  Skin Assessment: Skin Integrity Issues: Incisions: back  Last BM:  02/24/20  Height:   Ht Readings from Last 1 Encounters:  02/13/20 5\' 8"  (1.727 m)    Weight:   Wt Readings from Last 1 Encounters:  02/18/20 102.9 kg    BMI:  Body mass index is 34.49 kg/m.  Estimated Nutritional Needs:   Kcal:  1900-2100  Protein:  95-110 grams  Fluid:  2.0 L/day    Gustavus Bryant, MS, RD, LDN Inpatient Clinical Dietitian Please see [facility] for contact information.

## 2020-02-25 NOTE — Patient Care Conference (Signed)
Inpatient RehabilitationTeam Conference and Plan of Care Update Date: 02/25/2020   Time: 11:45 AM    Patient Name: James [name]      Medical Record Number: [id number]  Date of Birth: September 20, 1950 Sex: Male         Room/Bed: 4M08C/4M08C-01 Payor Info: Payor: MEDICARE / Plan: MEDICARE PART A AND B / Product Type: *No Product type* /    Admit Date/Time:  02/13/2020  3:44 PM  Primary Diagnosis:  Lumbar radiculopathy  Hospital Problems: Principal Problem:   Lumbar radiculopathy Active Problems:   Drug induced constipation   Hypokalemia   Labile blood pressure   Chronic diastolic congestive heart failure (HCC)   Controlled type 2 diabetes mellitus with hyperglycemia, without long-term current use of insulin ([place])   Solitary kidney, acquired   Left hip pain    Expected Discharge Date: Expected Discharge Date: 03/04/20  Team Members Present: Physician leading conference: Dr. [name] Care Coodinator Present: Dorthula Nettles, RN, BSN, CRRN;Becky Dupree, LCSW Nurse Present: Other (comment) Shayne Alken) PT Present: Magda Kiel, PT OT Present: Lillia Corporal, OT PPS Coordinator present : Ileana Ladd, Burna Mortimer, SLP     Current Status/Progress Goal Weekly Team Focus  Bowel/Bladder   continent B/B LBM 11/22  become independent with toileting  bladder scan qam to assess bladder emptying.   Swallow/Nutrition/ Hydration             ADL's   Mod LB dress, UB ADLs supervision, CGA for ADL transfers, brace fitting better but still rides up, continues to be limited by R hip pain  Min LB dress, CGA ADL transfers, otherwise Supervision  continue AE training for back precautions, family ed with wife, endurance, standing tolerance   Mobility   CGA/S for transfers, min A bed mobility with leg lifter, CGA/S ambulation 25' on average w/c rollator, mod A 1 step w/c rollator  mainly supervision overall except min A car transfer and stairs  ambulation, bed mobility, standing  tolerance, LE strength, balance   Communication             Safety/Cognition/ Behavioral Observations            Pain   Lower back pain improving with less c/o breakthrough pain. Dilaudid Qpm routinely. Requesting Oxycodone 1-2x/day  reach pain goal  anticipate and treat pain accordingly   Skin   Lower back surgical incision healing well. abrasions and ecchymosis also improving  wound healing  assess and treat wounds as ordered     Discharge Planning:  Wife has been in and involved in pt's care, was prior to admission. Pt pleased with his progress although feels it is slow, but will keep pushing in therapies   Team Discussion: Only giving Oxy during the day reports nursing. Continent B/B. In & out cath this AM due to bladder scan amount. OT reports patient still needs practice with the tub/shower bench. Ambulates with a close contact guard assist. PT reports patient is a min assist with bed mobility, is able to do 1 step with the roll-a-tor. Wife needs to come in for education. Patient on target to meet rehab goals: yes  *See Care Plan and progress notes for long and short-term goals.   Revisions to Treatment Plan:  Not at this time.  Teaching Needs: Wife needs to come in for family education.  Current Barriers to Discharge: Inaccessible home environment, Decreased caregiver support, Medical stability, Home enviroment access/layout, Incontinence and Weight bearing restrictions  Possible Resolutions to Barriers: Patient  refusing to be cathed and bladder scanned, provide continued education, provide education on weight bearing precautions. Provide emotional support to patient and spouse.     Medical Summary Current Status: cathed this AM- refused from now on; stopped bladder scans ; no skin issues; continent B/B-  Barriers to Discharge: Decreased family/caregiver support;Home enviroment access/layout;Medical stability;Weight;Weight bearing restrictions;Other (comments)  Barriers to  Discharge Comments: H/H- with wife 24/7 at d/c. disagrees with wife about cathing- will follow pt request Possible Resolutions to [employer] Focus: mod A LB ADLs; needs family education; almost to goal level; CGA transfers/ walks ~ 25 ft with rollator CGA;  d/c- 12/1   Continued Need for Acute Rehabilitation Level of Care: The patient requires daily medical management by a physician with specialized training in physical medicine and rehabilitation for the following reasons: Direction of a multidisciplinary physical rehabilitation program to maximize functional independence : Yes Medical management of patient stability for increased activity during participation in an intensive rehabilitation regime.: Yes Analysis of laboratory values and/or radiology reports with any subsequent need for medication adjustment and/or medical intervention. : Yes   I attest that I was present, lead the team conference, and concur with the assessment and plan of the team.   Cristi Loron 02/25/2020, 4:12 PM

## 2020-02-26 ENCOUNTER — Inpatient Hospital Stay (HOSPITAL_COMMUNITY): Payer: Medicare Other | Admitting: Occupational Therapy

## 2020-02-26 ENCOUNTER — Inpatient Hospital Stay (HOSPITAL_COMMUNITY): Payer: Medicare Other

## 2020-02-26 LAB — GLUCOSE, CAPILLARY
Glucose-Capillary: 115 mg/dL — ABNORMAL HIGH (ref 70–99)
Glucose-Capillary: 123 mg/dL — ABNORMAL HIGH (ref 70–99)
Glucose-Capillary: 141 mg/dL — ABNORMAL HIGH (ref 70–99)
Glucose-Capillary: 151 mg/dL — ABNORMAL HIGH (ref 70–99)

## 2020-02-26 NOTE — Progress Notes (Signed)
Physical Therapy Session Note  Patient Details  Name: James [name]. MRN: [id number] Date of Birth: 1951-01-12  Today's Date: 02/26/2020 PT Individual Time: 1105-1205 PT Individual Time Calculation (min): 60 min   Short Term Goals:  Week 2:  PT Short Term Goal 1 (Week 2): Patient will ambulate 1' with rollator and S. PT Short Term Goal 2 (Week 2): Patient will negotiate 1 step with rollator and min A for home entry. PT Short Term Goal 3 (Week 2): Patient will demonstrate bed mobility with min A 50% of the time. PT Short Term Goal 4 (Week 2): Patient will tolerate 3 minutes standing for functional task with S with intermittent UE support.     Skilled Therapeutic Interventions/Progress Updates:  Pt seated in w/c; he rated R buttock pain 7/10, premedicated.  Therapeutic exercises performed with LEs to increase strength for functional mobility: seated 10 x 1 R ankle pumps with extended knee and foot on stool, 20 x 1 L ankle pumps with extended knee with foot on stool;  20 x 1 bil adductor squeezes for core activation; 15 x 1 each R/L quad sets with foot on foot rest.  Self -stretching R/L hamstrings, seated with foot on foot rest x 30 seconds with ankle DF; pt unable to tolerate additional stretching due to R buttock pain.  Sit> stand with close supervision to rollator; PT adjusted LSO to lower on spine, and tightened LSO several inches with pt in standing.  Gait training on level tile x 50' including 2 turns, CGA, x 2.  One trial navigating around obstacle course without difficulty, x 80' with rollator.  2nd trial of sit> stand iwht resulting LOB backwards requiring CGA.  Pt related a fall at home PTA, backwards, into bathtub when reaching to flush toilet.  He has very minimal balance strategies in standing.  PT stood in parallel bars with forefeet on blue wedge with bil UE support; pt tolerated this < 10 seconds, stating that he felt he would fall backwards immediately upon letting go of 1  bar.  He was unwilling to try this further.   At end of session, pt seated in wc with needs at hand.     Therapy Documentation Precautions:  Precautions Precautions: Back Required Braces or Orthoses: Spinal Brace Spinal Brace: Lumbar corset, Applied in sitting position Restrictions Weight Bearing Restrictions: No         Therapy/Group: Individual Therapy  Karthikeya Funke 02/26/2020, 12:14 PM

## 2020-02-26 NOTE — Progress Notes (Signed)
During rounds, RN asked pt if he felt like he had to urinate and pt stated,"Yes, I can try." RN explained to pt that she would let him try and will be back in a few minutes to see if he urinated. RN came back to pt and pt urinated 161ml. Pt stated," I am mad that I got woken up to try and use the bathroom. The orders does not say to check me every 6 hours. Get out of my room." RN educated pt on the orders that states to in and out cath pt every 6 hrs if no void. Pt last void was at 1900 when he was in and out cath by day shift RN. RN apologized to pt for disturbing his sleep and explained that orders were being followed. Pt stated, "I will talk with the doctor about it in the morning, please get out of my room."

## 2020-02-26 NOTE — Progress Notes (Signed)
[facility] PHYSICAL MEDICINE & REHABILITATION PROGRESS NOTE   Subjective/Complaints:   Pt upset- was woken up at 2am by nurse " to pee" however bladder scan orders are gone.   Not sure why awoken.  Pt has been awake since 2am.  Had a bad night.  Hip pain is the same- but oxycodone is working "enough"- thinks we can switch completely to oxycodone by d/c.   ROS:   Pt denies SOB, abd pain, CP, N/V/C/D, and vision changes     Objective:   No results found. Recent Labs    02/24/20 0545  WBC 8.5  HGB 11.3*  HCT 35.4*  PLT 347   Recent Labs    02/24/20 0545  NA 145  K 3.7  CL 103  CO2 27  GLUCOSE 117*  BUN 25*  CREATININE 1.67*  CALCIUM 9.5    Intake/Output Summary (Last 24 hours) at 02/26/2020 1208 Last data filed at 02/26/2020 1009 Gross per 24 hour  Intake 615 ml  Output 825 ml  Net -210 ml        Physical Exam: Vital Signs Blood pressure (!) 151/74, pulse (!) 59, temperature 98.1 F (36.7 C), temperature source Oral, resp. rate 20, height 5\' 8"  (1.727 m), weight 102.9 kg, SpO2 98 %.   General: sitting up in w/c at sink, OT in room, NAD Mood and affect are appropriate Heart: RRR Lungs: CTA B/L- no W/R/R- good air movement Abdomen: Soft, NT, ND, (+)BS  Extremities: No clubbing, cyanosis, or edema Skin: No evidence of breakdown, no evidence of rash MSK- neg SLR, mild tenderness over the R PSIS  Neuro: Alert- Ox3- appropriate Motor: Bilateral upper extremities: 4+/5 proximal distal Right lower extremity: Hip flexion, knee extension, ankle dorsiflexion 4/5 Left lower extremity: Hip flexion, knee extension 4/5, ankle dorsiflexion 4 -/5  Assessment/Plan: 1. Functional deficits due to spinal stenosis, lumbar radiculopathy  per day of interdisciplinary therapy in a comprehensive inpatient rehab setting.  Physiatrist is providing close team supervision and 24 hour management of active medical problems listed below.  Physiatrist and rehab team  continue to assess barriers to discharge/monitor patient progress toward functional and medical goals  Care Tool:  Bathing    Body parts bathed by patient: Right arm, Left arm, Chest, Front perineal area, Abdomen, Left upper leg, Right upper leg, Right lower leg, Left lower leg, Face   Body parts bathed by helper: Buttocks     Bathing assist Assist Level: Minimal Assistance - Patient > 75%     Upper Body Dressing/Undressing Upper body dressing   What is the patient wearing?: Pull over shirt, Orthosis    Upper body assist Assist Level: Minimal Assistance - Patient > 75%    Lower Body Dressing/Undressing Lower body dressing      What is the patient wearing?: Pants     Lower body assist Assist for lower body dressing: Moderate Assistance - Patient 50 - 74%     Toileting Toileting    Toileting assist Assist for toileting: Moderate Assistance - Patient 50 - 74%     Transfers Chair/bed transfer  Transfers assist     Chair/bed transfer assist level: Contact Guard/Touching assist     Locomotion Ambulation   Ambulation assist      Assist level: Contact Guard/Touching assist Assistive device: Rollator Max distance: 40'   Walk 10 feet activity   Assist  Walk 10 feet activity did not occur: Safety/medical concerns  Assist level: Contact Guard/Touching assist Assistive device: Rollator   Walk  50 feet activity   Assist Walk 50 feet with 2 turns activity did not occur: Safety/medical concerns         Walk 150 feet activity   Assist Walk 150 feet activity did not occur: Safety/medical concerns         Walk 10 feet on uneven surface  activity   Assist Walk 10 feet on uneven surfaces activity did not occur: Safety/medical concerns   Assist level: Contact Guard/Touching assist Assistive device: Rollator   Wheelchair     Assist Will patient use wheelchair at discharge?: Yes Type of Wheelchair: Manual    Wheelchair assist level:  Supervision/Verbal cueing Max wheelchair distance: 90    Wheelchair 50 feet with 2 turns activity    Assist    Wheelchair 50 feet with 2 turns activity did not occur: Safety/medical concerns   Assist Level: Supervision/Verbal cueing   Wheelchair 150 feet activity     Assist  Wheelchair 150 feet activity did not occur: Safety/medical concerns       Blood pressure (!) 151/74, pulse (!) 59, temperature 98.1 F (36.7 C), temperature source Oral, resp. rate 20, height 5\' 8"  (1.727 m), weight 102.9 kg, SpO2 98 %.  Medical Problem List and Plan: 1.  Decreased functional mobility secondary to L3-4 4-5 spondylolisthesis arthropathy with radiculopathy/spinal stenosis.  Status post bilateral L3-4 4-5 laminotomy foraminotomies decompression interbody fusion 02/10/2020.  Back corset when out of bed  11/19- moved to 15/7 with therapy due to R hip pain.   Continue CIR 2.  Antithrombotics: -DVT/anticoagulation: SCDs.    11/16- will start Lovenox- has been 8 days- no bleeding seen/H/H stable  11/23- will not go home on Lovenox             -antiplatelet therapy: Plavix resumed 3. Pain Management: Flexeril as needed, Dilaudid 2-4 mg every 4 hours as needed pain  Oxy IR 10mg  x 2 yesterday with Hydromoprphone 2mg  last noc RIght post hip pain , likely sacroiliac- cont PT, topical analgesic   11/22- pain being basically controlled with pain meds, to make things "tolerable" per pt- oxy is working, but he had been concerned it wouldn't.   11/23- pain tolerable- con't regimen- wil see if can get switched over to Oxy by itself by d/c.   12/24- discussed getting rid of Dilaudid at d/c- pt agrees reasonable.  4. Mood: Provide emotional support             -antipsychotic agents: N/A 5. Neuropsych: This patient is capable of making decisions on his own behalf. 6. Skin/Wound Care: Routine skin checks 7. Fluids/Electrolytes/Nutrition: Routine in and outs 8.  Acute on chronic anemia.    Hemoglobin  11.5 on 11/15 9.  CAD with CABG/ICD.  Amiodarone 200 mg twice daily, Imdur 30 mg daily, Toprol 25 mg daily 10  COPD.  Check oxygen saturations every shift 11.  CKD stage III with history of right nephrectomy. Has single L kidney  Baseline creatinine 1.47-1.50  Creatinine 1.37 on 11/15, improving  11/17- wait on Prednisone and won't do NSAIDs due to Cr issues  11/19- pt still wants to wait on NSAIDs/Prednisone  11/22- Cr up to 1.67- on Lasix 40 mg BID and Entresto- both could make him have higher Cr if already up- will recheck Wednesday and push fluids.   11/24- Labs in AM 12.  Diabetes mellitus with hyperglycemia.  Hemoglobin A1c 5.9.  Currently on Tradjenta 5 mg daily.  Patient on Levemir 10 units daily prior to admission.  Resume  as needed  111/23- BGs 107-155- con't regimen  11/24- BGs 104- 162- better- con't regimen 13.  Chronic systolic congestive heart failure.  Entresto 49-51 mg twice daily, Lasix 40 mg twice daily.  Monitor for any signs of fluid overload. Filed Weights   02/13/20 1550 02/18/20 1547  Weight: 102 kg 102.9 kg   11/23- reordered daily weights  11/24- don't have weight yet 14.  Hypertension.  Monitor with increased mobility     Vitals:   02/25/20 1941 02/26/20 0600  BP: 116/68 (!) 151/74  Pulse: (!) 59 (!) 59  Resp: 19 20  Temp: 98.7 F (37.1 C) 98.1 F (36.7 C)  SpO2: 98%   on lasix, entresto, toprol,systolic BP elevated at noc but not during the day, check orthostatic vitals  11/23- BP 140/90 this AM, but usually 120s/60s- con't regimen 15.  Hyperlipidemia.  Lipitor 16.  Hypothyroidism.  Synthroid 17.  BPH.  Flomax 0.4 mg daily.    No sign of Urinary retention  will change bladder scan to daily  11/23- pt required cath this AM, but said they didn't let him void prior- at 6am- asked me to stop bladder scans- will do so, but explained if DOESN'T void x 8 hrs, will need cath- he agreed  11/24- voided overnight- at 2am- made sure got rid of caths if no void   18. Insomnia- takes melatonin 6 mg QHS- will resume 19. Hypokalemia  Potassium 3.6 on 11/15 after supplementation 20.  Drug-induced constipation  Bowel meds increased on 11/15.   11/23- LBM yesterday  LOS: 13 days A FACE TO FACE EVALUATION WAS PERFORMED  Jamira Barfuss 02/26/2020, 12:08 PM

## 2020-02-26 NOTE — Progress Notes (Signed)
Physical Therapy Session Note  Patient Details  Name: James [name]. MRN: [id number] Date of Birth: [birth year]  Today's Date: 02/26/2020 PT Individual Time: 0906-1000 PT Individual Time Calculation (min): 54 min   Short Term Goals: Week 2:  PT Short Term Goal 1 (Week 2): Patient will ambulate 77' with rollator and S. PT Short Term Goal 2 (Week 2): Patient will negotiate 1 step with rollator and min A for home entry. PT Short Term Goal 3 (Week 2): Patient will demonstrate bed mobility with min A 50% of the time. PT Short Term Goal 4 (Week 2): Patient will tolerate 3 minutes standing for functional task with S with intermittent UE support.  Skilled Therapeutic Interventions/Progress Updates:    Patient in w/c in room, reports slept till awakened at 2 for attempt to toilet then could not get back to sleep.  Patient in w/c assist to don compression socks.  Patient propelled w/c x 90' with S increased time.  Pushed rest of distance to dayroom.  Patient sit to stand x 5 mod cues for foot position to decrease posterior bias in standing, CGA for task.  Patient sit to stand for ambulation with rollator x 40' with close S except CGA for turning.  Patient standing to construct pipe tree design x 3 min then x 1.5 min till needing to sit.  Mod cues for completing design.  Patient seated for Kinetron x 2 min x 2 trials. Patient pushed in w/c to room for energy conservation.  Practiced sit to supine using leg lifter with increased time and CGA for positioning, pt did request to elevated HOB prior to getting both feet up.  Patient able to return to sit with S.  Sit to stand to rollator and stan pivot to w/c with CGA.  Patient in w/c assist to apply muscle rub to R lower PSIS area.  Patient left in w/c with NT in room to assist with toileting.   Therapy Documentation Precautions:  Precautions Precautions: Back Required Braces or Orthoses: Spinal Brace Spinal Brace: Lumbar corset, Applied in sitting  position Restrictions Weight Bearing Restrictions: No Pain: Pain Assessment Pain Scale: 0-10 Pain Score: 7  Pain Type: Acute pain Pain Location: Pelvis Pain Orientation: Right Pain Descriptors / Indicators: Aching Pain Frequency: Intermittent Pain Onset: On-going Patients Stated Pain Goal: 2 Pain Intervention(s): Repositioned;Rest    Therapy/Group: Individual Therapy  Reginia Naas  Magda Kiel, PT 02/26/2020, 9:28 AM

## 2020-02-26 NOTE — Progress Notes (Signed)
Occupational Therapy Session Note  Patient Details  Name: James [name]. MRN: [id number] Date of Birth: [birth year]  Today's Date: 02/26/2020 OT Individual Time: 1040-4591 OT Individual Time Calculation (min): 45 min    Short Term Goals: Week 2:  OT Short Term Goal 1 (Week 2): Pt will complete toileting with mod assist using AE PRN OT Short Term Goal 2 (Week 2): Pt will complete bathing with AE with Min A or better OT Short Term Goal 3 (Week 2): Pt will perform LB dress with AE with Min A OT Short Term Goal 4 (Week 2): Pt will don back brace consistently with Supervision  Skilled Therapeutic Interventions/Progress Updates:    Pt received sitting up in w/c, reporting fatigue from not sleeping well. Pt reporting pain is well managed from being premedicated. LSO already on. Oral care at the sink with mod I from w/c level. Pt was taken via w/c to the therapy gym. Stand pivot transfer to the mat with CGA using rollator, min A to power up from w/c. Pt stood with rollator, requiring min A to stand. He maintained standing balance with CGA while alternating between single and bilateral UE use to built lego model- working on standing balance and reduced UE reliance on rollator. Pt required extended rest break following 2/2 pain and fatigue. Another trial completed, pt limited by visual deficits and lasting only 30 seconds in standing. Pt completed ambulatory transfer back to his w/c with min A. Pt was taken back to his room and left sitting up with all needs met, chair alarm belt set.   Therapy Documentation Precautions:  Precautions Precautions: Back Required Braces or Orthoses: Spinal Brace Spinal Brace: Lumbar corset, Applied in sitting position Restrictions Weight Bearing Restrictions: No  Therapy/Group: Individual Therapy  Curtis Sites 02/26/2020, 6:28 AM

## 2020-02-27 LAB — GLUCOSE, CAPILLARY
Glucose-Capillary: 136 mg/dL — ABNORMAL HIGH (ref 70–99)
Glucose-Capillary: 150 mg/dL — ABNORMAL HIGH (ref 70–99)
Glucose-Capillary: 116 mg/dL — ABNORMAL HIGH (ref 70–99)
Glucose-Capillary: 146 mg/dL — ABNORMAL HIGH (ref 70–99)

## 2020-02-27 LAB — BASIC METABOLIC PANEL
Anion gap: 12 (ref 5–15)
BUN: 27 mg/dL — ABNORMAL HIGH (ref 8–23)
CO2: 26 mmol/L (ref 22–32)
Calcium: 9 mg/dL (ref 8.9–10.3)
Chloride: 104 mmol/L (ref 98–111)
Creatinine, Ser: 1.63 mg/dL — ABNORMAL HIGH (ref 0.61–1.24)
GFR, Estimated: 46 mL/min — ABNORMAL LOW (ref >60)
Glucose, Bld: 166 mg/dL — ABNORMAL HIGH (ref 70–99)
Potassium: 3.3 mmol/L — ABNORMAL LOW (ref 3.5–5.1)
Sodium: 142 mmol/L (ref 135–145)

## 2020-02-27 MED ORDER — POTASSIUM CHLORIDE CRYS ER 10 MEQ PO TBCR
10.0000 meq | EXTENDED_RELEASE_TABLET | Freq: Every day | ORAL | Status: DC
  Administered 2020-02-28 – 2020-03-04 (×6): 10 meq via ORAL
  Filled 2020-02-27 (×6): qty 1

## 2020-02-27 MED ORDER — POTASSIUM CHLORIDE CRYS ER 20 MEQ PO TBCR
40.0000 meq | EXTENDED_RELEASE_TABLET | Freq: Once | ORAL | Status: AC
  Administered 2020-02-27: 40 meq via ORAL
  Filled 2020-02-27: qty 2

## 2020-02-27 NOTE — Progress Notes (Signed)
   02/25/20 0530  Output (mL)  Urine 100 mL  Urine Characteristics  Urinary Incontinence No  Urine Color Yellow/straw  Urine Appearance Clear  Urinary Interventions Intermittent/Straight cath  Bladder Scan Volume (mL) 336 mL  Intermittent/Straight Cath (mL) 500 mL   Addendum for 02/25/2020:  Patient voided 26mL. RN then bladder scanned as ordered for 364mL. Asked patient to attempt to void again. He was only able to void 7mL additional, he then stated "just cath me then." He instructed the RN he had a box of coude catheters in his closet. He was In and Out catheterized using the coude for 590mL. This was explained to him, he did stated understanding. His wife later called and this was also explained to her, she also stated understanding and expressed her gratitude.

## 2020-02-27 NOTE — Progress Notes (Signed)
Requests that no one enters room after evening CBG done at 9pm until 6am. All evening meds were given, call bell within reach and door closed as requested.

## 2020-02-27 NOTE — Progress Notes (Signed)
[place] PHYSICAL MEDICINE & REHABILITATION PROGRESS NOTE   Subjective/Complaints:   Pt reports no scans overnight- someone tried, but he said no- the orders are discontinued for any scans.   LBM Tuesday- denies constipation- wants to wait til tomorrow before we try any additional bowel meds.   Has miralax- drinking it right now.  Feels he's voiding OK- refusing bladder scans/in/out caths.    ROS:   Pt denies SOB, abd pain, CP, N/V/C/D, and vision changes    Objective:   No results found. No results for input(s): WBC, HGB, HCT, PLT in the last 72 hours. Recent Labs    02/27/20 0423  NA 142  K 3.3*  CL 104  CO2 26  GLUCOSE 166*  BUN 27*  CREATININE 1.63*  CALCIUM 9.0    Intake/Output Summary (Last 24 hours) at 02/27/2020 0933 Last data filed at 02/27/2020 0400 Gross per 24 hour  Intake 480 ml  Output 725 ml  Net -245 ml        Physical Exam: Vital Signs Blood pressure 139/60, pulse 73, temperature 99.2 F (37.3 C), resp. rate 14, height 5\' 8"  (1.727 m), weight 102.7 kg, SpO2 98 %.   General: sitting up in bedside chair, appropriate, Ox3, wearing LSO- but is up higher than expected; NAD Mood and affect are appropriate Heart: RRR Lungs: CTA B/L- no W/R/R- good air movement Abdomen: Soft, NT, ND, (+)BS - hypoactive  Extremities: No clubbing, cyanosis, or edema Skin: No evidence of breakdown, no evidence of rash MSK- neg SLR, mild tenderness over the R PSIS  Neuro: Alert- Ox3- appropriate Motor: Bilateral upper extremities: 4+/5 proximal distal Right lower extremity: Hip flexion, knee extension, ankle dorsiflexion 4/5 Left lower extremity: Hip flexion, knee extension 4/5, ankle dorsiflexion 4 -/5  Assessment/Plan: 1. Functional deficits due to spinal stenosis, lumbar radiculopathy  per day of interdisciplinary therapy in a comprehensive inpatient rehab setting.  Physiatrist is providing close team supervision and 24 hour management of active  medical problems listed below.  Physiatrist and rehab team continue to assess barriers to discharge/monitor patient progress toward functional and medical goals  Care Tool:  Bathing    Body parts bathed by patient: Right arm, Left arm, Chest, Front perineal area, Abdomen, Left upper leg, Right upper leg, Right lower leg, Left lower leg, Face   Body parts bathed by helper: Buttocks     Bathing assist Assist Level: Minimal Assistance - Patient > 75%     Upper Body Dressing/Undressing Upper body dressing   What is the patient wearing?: Pull over shirt, Orthosis    Upper body assist Assist Level: Minimal Assistance - Patient > 75%    Lower Body Dressing/Undressing Lower body dressing      What is the patient wearing?: Pants     Lower body assist Assist for lower body dressing: Moderate Assistance - Patient 50 - 74%     Toileting Toileting    Toileting assist Assist for toileting: Moderate Assistance - Patient 50 - 74%     Transfers Chair/bed transfer  Transfers assist     Chair/bed transfer assist level: Contact Guard/Touching assist     Locomotion Ambulation   Ambulation assist      Assist level: Contact Guard/Touching assist Assistive device: Rollator Max distance: 40'   Walk 10 feet activity   Assist  Walk 10 feet activity did not occur: Safety/medical concerns  Assist level: Contact Guard/Touching assist Assistive device: Rollator   Walk 50 feet activity   Assist Walk 50  feet with 2 turns activity did not occur: Safety/medical concerns         Walk 150 feet activity   Assist Walk 150 feet activity did not occur: Safety/medical concerns         Walk 10 feet on uneven surface  activity   Assist Walk 10 feet on uneven surfaces activity did not occur: Safety/medical concerns   Assist level: Contact Guard/Touching assist Assistive device: Rollator   Wheelchair     Assist Will patient use wheelchair at discharge?:  Yes Type of Wheelchair: Manual    Wheelchair assist level: Supervision/Verbal cueing Max wheelchair distance: 90    Wheelchair 50 feet with 2 turns activity    Assist    Wheelchair 50 feet with 2 turns activity did not occur: Safety/medical concerns   Assist Level: Supervision/Verbal cueing   Wheelchair 150 feet activity     Assist  Wheelchair 150 feet activity did not occur: Safety/medical concerns       Blood pressure 139/60, pulse 73, temperature 99.2 F (37.3 C), resp. rate 14, height 5\' 8"  (1.727 m), weight 102.7 kg, SpO2 98 %.  Medical Problem List and Plan: 1.  Decreased functional mobility secondary to L3-4 4-5 spondylolisthesis arthropathy with radiculopathy/spinal stenosis.  Status post bilateral L3-4 4-5 laminotomy foraminotomies decompression interbody fusion 02/10/2020.  Back corset when out of bed  11/19- moved to 15/7 with therapy due to R hip pain.   Continue CIR 2.  Antithrombotics: -DVT/anticoagulation: SCDs.    11/16- will start Lovenox- has been 8 days- no bleeding seen/H/H stable  11/23- will not go home on Lovenox             -antiplatelet therapy: Plavix resumed 3. Pain Management: Flexeril as needed, Dilaudid 2-4 mg every 4 hours as needed pain  Oxy IR 10mg  x 2 yesterday with Hydromoprphone 2mg  last noc RIght post hip pain , likely sacroiliac- cont PT, topical analgesic   12/24- discussed getting rid of Dilaudid at d/c- pt agrees reasonable.   11/25- no change- con't regimen 4. Mood: Provide emotional support             -antipsychotic agents: N/A 5. Neuropsych: This patient is capable of making decisions on his own behalf. 6. Skin/Wound Care: Routine skin checks 7. Fluids/Electrolytes/Nutrition: Routine in and outs 8.  Acute on chronic anemia.    Hemoglobin 11.5 on 11/15 9.  CAD with CABG/ICD.  Amiodarone 200 mg twice daily, Imdur 30 mg daily, Toprol 25 mg daily 10  COPD.  Check oxygen saturations every shift 11.  CKD stage III with  history of right nephrectomy. Has single L kidney  Baseline creatinine 1.47-1.50  Creatinine 1.37 on 11/15, improving  11/17- wait on Prednisone and won't do NSAIDs due to Cr issues  11/19- pt still wants to wait on NSAIDs/Prednisone  11/22- Cr up to 1.67- on Lasix 40 mg BID and Entresto- both could make him have higher Cr if already up- will recheck Wednesday and push fluids.   11/24- Labs in AM  11/25- Cr 1.63- BUN 27- slightly better- but basically the same- which is higher than normal- pt says he's drinking, but based on volumes, hard to see if drinking enough- urine darker- con't to push fluids- might need IVFs 12.  Diabetes mellitus with hyperglycemia.  Hemoglobin A1c 5.9.  Currently on Tradjenta 5 mg daily.  Patient on Levemir 10 units daily prior to admission.  Resume as needed  111/23- BGs 107-155- con't regimen  11/24- BGs 104- 162-  better- con't regimen  11/25- BD 123-151- con't regimen 13.  Chronic systolic congestive heart failure.  Entresto 49-51 mg twice daily, Lasix 40 mg twice daily.  Monitor for any signs of fluid overload. Filed Weights   02/18/20 1547 02/26/20 0600 02/27/20 0500  Weight: 102.9 kg 103.5 kg 102.7 kg   11/25- weights stable- con't regimen 14.  Hypertension.  Monitor with increased mobility     Vitals:   02/26/20 1928 02/27/20 0604  BP: 128/61 139/60  Pulse: 70 73  Resp:    Temp: 98.9 F (37.2 C) 99.2 F (37.3 C)  SpO2: 100% 98%  on lasix, entresto, toprol,systolic BP elevated at noc but not during the day, check orthostatic vitals  11/23- BP 140/90 this AM, but usually 120s/60s- con't regimen  11/25- BP 139/60 -con't regimen 15.  Hyperlipidemia.  Lipitor 16.  Hypothyroidism.  Synthroid 17.  BPH.  Flomax 0.4 mg daily.    No sign of Urinary retention  will change bladder scan to daily  11/23- pt required cath this AM, but said they didn't let him void prior- at 6am- asked me to stop bladder scans- will do so, but explained if DOESN'T void x 8 hrs,  will need cath- he agreed  11/24- voided overnight- at 2am- made sure got rid of caths if no void  18. Insomnia- takes melatonin 6 mg QHS- will resume 19. Hypokalemia  Potassium 3.6 on 11/15 after supplementation  11/25- K+ 3.3- might need regular KCl 20.  Drug-induced constipation  Bowel meds increased on 11/15.   11/23- LBM yesterday  11/24- LBM 2 days ago- refusing additional bowel meds today- will do tomorrow.   LOS: 14 days A FACE TO FACE EVALUATION WAS PERFORMED  Fatim Vanderschaaf 02/27/2020, 9:33 AM

## 2020-02-28 ENCOUNTER — Inpatient Hospital Stay (HOSPITAL_COMMUNITY): Payer: Medicare Other | Admitting: Occupational Therapy

## 2020-02-28 ENCOUNTER — Inpatient Hospital Stay (HOSPITAL_COMMUNITY): Payer: Medicare Other | Admitting: Physical Therapy

## 2020-02-28 DIAGNOSIS — G8918 Other acute postprocedural pain: Secondary | ICD-10-CM

## 2020-02-28 DIAGNOSIS — N1832 Chronic kidney disease, stage 3b: Secondary | ICD-10-CM

## 2020-02-28 LAB — GLUCOSE, CAPILLARY
Glucose-Capillary: 139 mg/dL — ABNORMAL HIGH (ref 70–99)
Glucose-Capillary: 146 mg/dL — ABNORMAL HIGH (ref 70–99)
Glucose-Capillary: 123 mg/dL — ABNORMAL HIGH (ref 70–99)
Glucose-Capillary: 144 mg/dL — ABNORMAL HIGH (ref 70–99)

## 2020-02-28 MED ORDER — POLYETHYLENE GLYCOL 3350 17 G PO PACK
17.0000 g | PACK | Freq: Two times a day (BID) | ORAL | Status: DC
  Administered 2020-02-28 – 2020-03-04 (×9): 17 g via ORAL
  Filled 2020-02-28 (×10): qty 1

## 2020-02-28 NOTE — Progress Notes (Signed)
Occupational Therapy Weekly Progress Note  Patient Details  Name: James [name]. MRN: [id number] Date of Birth: Jan 01, 1951  Beginning of progress report period: February 20, 2020 End of progress report period: February 28, 2020  Today's Date: 02/28/2020 OT Individual Time: 7096-4383 OT Individual Time Calculation (min): 30 min  and Today's Date: 02/28/2020 OT Missed Time: 32 Minutes Missed Time Reason: Patient ill (comment) (pt not feeling well, requesting to miss rest of session)   Patient has met 1 of 4 short term goals.  Pt is overall Min A with bathing at shower level with use of AE and compensatory techniques while maintaining precautions, Min/Mod for LB dressing as he has difficulty threading with reacher but is now able to help don over hips in standing, and performs all transfers with CGA with rollator. Pt requires extended time for all tasks d/t slow movements and has recently been limited by pain in R hip. Planning for family education with wife [name] next week in preparation for DC home.   Patient continues to demonstrate the following deficits: muscle weakness, decreased cardiorespiratoy endurance, decreased motor planning and decreased sitting balance, decreased standing balance, decreased postural control and decreased balance strategies and therefore will continue to benefit from skilled OT intervention to enhance overall performance with BADL and Reduce care partner burden.  Patient progressing toward long term goals..  Continue plan of care.  OT Short Term Goals Week 2:  OT Short Term Goal 1 (Week 2): Pt will complete toileting with mod assist using AE PRN OT Short Term Goal 1 - Progress (Week 2): Progressing toward goal OT Short Term Goal 2 (Week 2): Pt will complete bathing with AE with Min A or better OT Short Term Goal 2 - Progress (Week 2): Met OT Short Term Goal 3 (Week 2): Pt will perform LB dress with AE with Min A OT Short Term Goal 3 - Progress (Week 2):  Progressing toward goal OT Short Term Goal 4 (Week 2): Pt will don back brace consistently with Supervision OT Short Term Goal 4 - Progress (Week 2): Progressing toward goal Week 3:  OT Short Term Goal 1 (Week 3): STGs = LTGs d/t ELOS  Skilled Therapeutic Interventions/Progress Updates:    Pt greeted at time of session supine in bed resting, which is odd for this pt as he is normally up in chair, pt stating he did not feel well and thinks he has a UTI. Pt declined ADL, stating he had urinated in bed this am and staff had helped him wash up and change clothing, but did want to brush teeth and wash face, at first agreeable at sink level but changed mind and requested sitting EOB for tasks d/t fatigue. Supine to sit EOB Supervision with HOB elevated, and pt sat EOB to complete tasks for approx 15-20 mins. Set up for oral hygiene and face washing sitting EOB. Note that dressing had come off of back, replaced tegaderm with new patch and NT aware. Doffed/donned shirt with Set up using hemitechniques from previous session d/t arthritic L shoulder. Sit to supine CGA with use of leg lifter. Requesting to miss remainder of session as pt was not feeling well, missed 45 mins. Alarm on, call bell in reach.   Therapy Documentation Precautions:  Precautions Precautions: Back Required Braces or Orthoses: Spinal Brace Spinal Brace: Lumbar corset, Applied in sitting position Restrictions Weight Bearing Restrictions: No     Therapy/Group: Individual Therapy  Viona Gilmore 02/28/2020, 9:09 AM

## 2020-02-28 NOTE — Progress Notes (Addendum)
Physical Therapy Note  Patient Details  Name: James [name]. MRN: [id number] Date of Birth: 08-07-1950 Today's Date: 02/28/2020    PT attempted to see pt for PT treatment session however pt reported he was not feeling well. PT asked pt if he wanted to attempt a short walk pt reported "no.", attempted to assist pt in transfer OOB to recliner or sitting EOB however pt also reported shortly, "no", pt reported "some" pain in Rt shoulder and Lt hip, did not rank and denied need of nursing care. PT asked pt if he could just do strengthening exercises in bed, and pt got very agitated and reported, "I don't know what you're not getting but I am not doing therapy." pt also reported he thinks he has a UTI and did not want to get up until this had been tested. PT attempted to educate pt on importance of participation with PT during stay and he reported he understood but did not want to work with PT this AM. Pt left as found, supine in bed, All needs in reach and in good condition. Call light in reach.     Session 2: PT attempted to see pt for second PT session this afternoon, pt continued to deny PT and mulitple attempts made for various treatment options. Pt continued to be agitated until PT reported I wouldn't be able to return this date for additional attempts. Pt reported "that's good I don't want to try again today. Thank you." pt left as found.   Pt missed total of 45 mins with first session and 75 mins second session  [name] 02/28/2020, 10:36 AM

## 2020-02-28 NOTE — Plan of Care (Signed)
  Problem: Consults Goal: RH SPINAL CORD INJURY PATIENT EDUCATION Description:  See Patient Education module for education specifics.  Outcome: Progressing Goal: Skin Care Protocol Initiated - if Braden Score 18 or less Description: If consults are not indicated, leave blank or document N/A Outcome: Progressing Goal: Nutrition Consult-if indicated Outcome: Progressing Goal: Diabetes Guidelines if Diabetic/Glucose > 140 Description: If diabetic or lab glucose is > 140 mg/dl - Initiate Diabetes/Hyperglycemia Guidelines & Document Interventions  Outcome: Progressing   Problem: SCI BOWEL ELIMINATION Goal: RH STG MANAGE BOWEL WITH ASSISTANCE Description: STG Manage Bowel with  moderate Assistance. Outcome: Progressing Goal: RH STG SCI MANAGE BOWEL WITH MEDICATION WITH ASSISTANCE Description: STG SCI Manage bowel with medication with moderate assistance. Outcome: Progressing Goal: RH OTHER STG BOWEL ELIMINATION GOALS W/ASSIST Description: Other STG Bowel Elimination Goals With moderate Assistance. Outcome: Progressing   Problem: SCI BLADDER ELIMINATION Goal: RH STG MANAGE BLADDER WITH ASSISTANCE Description: STG Manage Bladder With moderate Assistance Outcome: Progressing   Problem: RH SKIN INTEGRITY Goal: RH STG SKIN FREE OF INFECTION/BREAKDOWN Outcome: Progressing   Problem: RH SAFETY Goal: RH STG ADHERE TO SAFETY PRECAUTIONS W/ASSISTANCE/DEVICE Description: STG Adhere to Safety Precautions With  moderate Assistance/Device. Outcome: Progressing   Problem: RH PAIN MANAGEMENT Goal: RH STG PAIN MANAGED AT OR BELOW PT'S PAIN GOAL Description: Pain less than 2 Outcome: Progressing   Problem: RH KNOWLEDGE DEFICIT SCI Goal: RH STG INCREASE KNOWLEDGE OF SELF CARE AFTER SCI Description: Moderate assistance regarding self knowledge after SCI Outcome: Progressing

## 2020-02-28 NOTE — Progress Notes (Signed)
[place] PHYSICAL MEDICINE & REHABILITATION PROGRESS NOTE   Subjective/Complaints: Patient seen laying in bed this morning.  He states he did not sleep well overnight.  He states he feels like he has a UTI because he is having some back discomfort and some "moodiness".  He states he needs some medications to regulate his bowels.  ROS: + Constipation. Denies CP, SOB, N/V/D  Objective:   No results found. No results for input(s): WBC, HGB, HCT, PLT in the last 72 hours. Recent Labs    02/27/20 0423  NA 142  K 3.3*  CL 104  CO2 26  GLUCOSE 166*  BUN 27*  CREATININE 1.63*  CALCIUM 9.0    Intake/Output Summary (Last 24 hours) at 02/28/2020 1104 Last data filed at 02/28/2020 0725 Gross per 24 hour  Intake 120 ml  Output 1625 ml  Net -1505 ml        Physical Exam: Vital Signs Blood pressure (!) 161/64, pulse 84, temperature 97.8 F (36.6 C), temperature source Oral, resp. rate 17, height 5\' 8"  (1.727 m), weight 103.2 kg, SpO2 98 %. Constitutional: No distress . Vital signs reviewed. HENT: Normocephalic.  Atraumatic. Eyes: EOMI. No discharge. Cardiovascular: No JVD.  RRR. Respiratory: Normal effort.  No stridor.  Bilateral clear to auscultation. GI: Non-distended.  BS +. Skin: Warm and dry.  Intact. Psych: Normal mood.  Normal behavior. Musc: No edema in extremities.  No tenderness in extremities. Neuro: Alert Motor: Bilateral upper extremities: 4+/5 proximal distal Right lower extremity: Hip flexion, knee extension, ankle dorsiflexion 4/5, unchanged Left lower extremity: Hip flexion, knee extension 4/5, ankle dorsiflexion 4 -/5, unchanged  Assessment/Plan: 1. Functional deficits due to spinal stenosis, lumbar radiculopathy  per day of interdisciplinary therapy in a comprehensive inpatient rehab setting.  Physiatrist is providing close team supervision and 24 hour management of active medical problems listed below.  Physiatrist and rehab team continue to assess  barriers to discharge/monitor patient progress toward functional and medical goals  Care Tool:  Bathing    Body parts bathed by patient: Right arm, Left arm, Chest, Front perineal area, Abdomen, Left upper leg, Right upper leg, Right lower leg, Left lower leg, Face   Body parts bathed by helper: Buttocks     Bathing assist Assist Level: Minimal Assistance - Patient > 75%     Upper Body Dressing/Undressing Upper body dressing   What is the patient wearing?: Pull over shirt, Orthosis    Upper body assist Assist Level: Minimal Assistance - Patient > 75%    Lower Body Dressing/Undressing Lower body dressing      What is the patient wearing?: Pants     Lower body assist Assist for lower body dressing: Moderate Assistance - Patient 50 - 74%     Toileting Toileting    Toileting assist Assist for toileting: Moderate Assistance - Patient 50 - 74%     Transfers Chair/bed transfer  Transfers assist     Chair/bed transfer assist level: Contact Guard/Touching assist     Locomotion Ambulation   Ambulation assist      Assist level: Contact Guard/Touching assist Assistive device: Rollator Max distance: 40'   Walk 10 feet activity   Assist  Walk 10 feet activity did not occur: Safety/medical concerns  Assist level: Contact Guard/Touching assist Assistive device: Rollator   Walk 50 feet activity   Assist Walk 50 feet with 2 turns activity did not occur: Safety/medical concerns         Walk 150 feet activity  Assist Walk 150 feet activity did not occur: Safety/medical concerns         Walk 10 feet on uneven surface  activity   Assist Walk 10 feet on uneven surfaces activity did not occur: Safety/medical concerns   Assist level: Contact Guard/Touching assist Assistive device: Rollator   Wheelchair     Assist Will patient use wheelchair at discharge?: Yes Type of Wheelchair: Manual    Wheelchair assist level: Supervision/Verbal  cueing Max wheelchair distance: 90    Wheelchair 50 feet with 2 turns activity    Assist    Wheelchair 50 feet with 2 turns activity did not occur: Safety/medical concerns   Assist Level: Supervision/Verbal cueing   Wheelchair 150 feet activity     Assist  Wheelchair 150 feet activity did not occur: Safety/medical concerns       Blood pressure (!) 161/64, pulse 84, temperature 97.8 F (36.6 C), temperature source Oral, resp. rate 17, height 5\' 8"  (1.727 m), weight 103.2 kg, SpO2 98 %.  Medical Problem List and Plan: 1.  Decreased functional mobility secondary to L3-4 4-5 spondylolisthesis arthropathy with radiculopathy/spinal stenosis.  Status post bilateral L3-4 4-5 laminotomy foraminotomies decompression interbody fusion 02/10/2020.  Back corset when out of bed  Continue CIR, 15/7 2.  Antithrombotics: -DVT/anticoagulation: SCDs.    Lovenox             -antiplatelet therapy: Plavix resumed 3. Pain Management: Flexeril as needed, Dilaudid 2-4 mg every 4 hours as needed pain  RIght post hip pain , likely sacroiliac- cont PT, topical analgesic   Relatively controlled with meds on 11/26 4. Mood: Provide emotional support             -antipsychotic agents: N/A 5. Neuropsych: This patient is capable of making decisions on his own behalf. 6. Skin/Wound Care: Routine skin checks 7. Fluids/Electrolytes/Nutrition: Routine in and outs 8.  Acute on chronic anemia.    Hemoglobin 11.3 on 11/22  Continue to monitor 9.  CAD with CABG/ICD.  Amiodarone 200 mg twice daily, Imdur 30 mg daily, Toprol 25 mg daily 10  COPD.  Check oxygen saturations every shift 11.  CKD stage III with history of right nephrectomy. Has single L kidney  Baseline creatinine 1.47-1.50  Creatinine 1.63 on 11/25, BUN improving-labs ordered for Monday  12.  Diabetes mellitus with hyperglycemia.  Hemoglobin A1c 5.9.  Currently on Tradjenta 5 mg daily.  Patient on Levemir 10 units daily prior to admission.  Resume  as needed  Slightly elevated on 11/26, will consider medication adjustments if persistent 13.  Chronic systolic congestive heart failure.  Entresto 49-51 mg twice daily, Lasix 40 mg twice daily.  Monitor for any signs of fluid overload. Filed Weights   02/26/20 0600 02/27/20 0500 02/28/20 0539  Weight: 103.5 kg 102.7 kg 103.2 kg   Stable on 11/26 14.  Hypertension.  Monitor with increased mobility   Vitals:   02/27/20 1919 02/28/20 0539  BP: (!) 117/47 (!) 161/64  Pulse: 62 84  Resp:  17  Temp: 98.1 F (36.7 C) 97.8 F (36.6 C)  SpO2: 95% 98%   On lasix, entresto, toprol   Labile on 11/26, monitor for trend 15.  Hyperlipidemia.  Lipitor 16.  Hypothyroidism.  Synthroid 17.  BPH.  Flomax 0.4 mg daily.    ?  Improving 18.  Sleep disturbance: Continue melatonin 6 mg QHS (PTA medication) 19. Hypokalemia  Potassium 3.3 on 11/25, labs ordered for Monday  Magnesium on Monday   Daily supplement  initiated 20.  Drug-induced constipation  Bowel meds increased on 11/15, increased again on 11/26.   LOS: 15 days A FACE TO FACE EVALUATION WAS PERFORMED  Chidera Dearcos Lorie Phenix 02/28/2020, 11:04 AM

## 2020-02-29 ENCOUNTER — Inpatient Hospital Stay (HOSPITAL_COMMUNITY): Payer: Medicare Other | Admitting: Occupational Therapy

## 2020-02-29 ENCOUNTER — Inpatient Hospital Stay (HOSPITAL_COMMUNITY): Payer: Medicare Other | Admitting: Physical Therapy

## 2020-02-29 LAB — URINALYSIS, COMPLETE (UACMP) WITH MICROSCOPIC
Bilirubin Urine: NEGATIVE
Glucose, UA: NEGATIVE mg/dL
Ketones, ur: NEGATIVE mg/dL
Nitrite: NEGATIVE
Protein, ur: 100 mg/dL — AB
Specific Gravity, Urine: 1.012 (ref 1.005–1.030)
WBC, UA: >50 WBC/hpf — ABNORMAL HIGH (ref 0–5)
pH: 5 (ref 5.0–8.0)

## 2020-02-29 LAB — GLUCOSE, CAPILLARY
Glucose-Capillary: 148 mg/dL — ABNORMAL HIGH (ref 70–99)
Glucose-Capillary: 205 mg/dL — ABNORMAL HIGH (ref 70–99)
Glucose-Capillary: 98 mg/dL (ref 70–99)
Glucose-Capillary: 142 mg/dL — ABNORMAL HIGH (ref 70–99)

## 2020-02-29 MED ORDER — INSULIN DETEMIR 100 UNIT/ML ~~LOC~~ SOLN
5.0000 [IU] | Freq: Every day | SUBCUTANEOUS | Status: DC
  Administered 2020-02-29 – 2020-03-04 (×5): 5 [IU] via SUBCUTANEOUS
  Filled 2020-02-29 (×5): qty 0.05

## 2020-02-29 NOTE — Progress Notes (Signed)
Occupational Therapy Session Note  Patient Details  Name: James [name]. MRN: [id number] Date of Birth: 1950/10/12  Today's Date: 02/29/2020 OT Individual Time: 8921-1941 OT Individual Time Calculation (min): 55 min   Skilled Therapeutic Interventions/Progress Updates:    Pt greeted in the w/c with pain manageable for tx. Pt states that he is trying to "wean" himself off of the pain medicine. Started by completing oral care + other grooming tasks while sitting at the sink. He required vcs for not twisting when retrieving needed items. Note that he also stated that he only wanted to shave at the sink (declining participation in oral care or other tasks) but when he self propelled the w/c to the sink, pt started engaging in these other tasks. He declined showering or using the restroom, agreeable to work on UB strengthening and mobility using the rollator. Escorted pt via w/c down to the atrium. Vcs for locking rollator brakes at appropriate times during ambulation in the atrium/food court, close supervision for balance. Sit<stand from chair in food court completed with supervision assist and then he returned to the w/c. Note that when he was backing up to the chair, pt with a posterior LOB with chair preventing fall. Per pt, his hip "just gave out" without warning, which he states is normal. He then self propelled the w/c in this area to work on UB strengthening and endurance. Pt was then escorted back to the room and remained sitting up, left with all needs within reach and safety belt fastened.   Therapy Documentation Precautions:  Precautions Precautions: Back Required Braces or Orthoses: Spinal Brace Spinal Brace: Lumbar corset, Applied in sitting position Restrictions Weight Bearing Restrictions: No Vital Signs: Therapy Vitals Temp: 99 F (37.2 C) Pulse Rate: (!) 59 Resp: 18 BP: (!) 108/59 Patient Position (if appropriate): Sitting Oxygen Therapy SpO2: 100 % O2 Device: Room  Air Pain: Pain Assessment Pain Scale: 0-10 Pain Score: 0-No pain ADL: ADL Grooming: Setup Where Assessed-Grooming: Sitting at sink Upper Body Bathing: Setup, Supervision/safety Where Assessed-Upper Body Bathing: Edge of bed Lower Body Bathing: Maximal assistance Where Assessed-Lower Body Bathing: Edge of bed Upper Body Dressing: Moderate assistance Where Assessed-Upper Body Dressing: Edge of bed Lower Body Dressing: Maximal assistance Where Assessed-Lower Body Dressing: Edge of bed Toilet Transfer: Moderate assistance Toilet Transfer Method: Stand pivot Toilet Transfer Equipment: Bedside commode      Therapy/Group: Individual Therapy  Lynasia Meloche A Korby Ratay 02/29/2020, 3:49 PM

## 2020-02-29 NOTE — Progress Notes (Signed)
Physical Therapy Weekly Progress Note  Patient Details  Name: James [name]. MRN: [id number] Date of Birth: 1950/05/05  Beginning of progress report period: February 22, 2020 End of progress report period: February 29, 2020  Today's Date: 02/29/2020 PT Individual Time: 0800-0910 PT Individual Time Calculation (min): 70 min   Patient has met 4 of 4 short term goals. Pt is now able to perform bed mobility with min A 50% of the time, can ambulate with his rollator and supervision for 100ft, and is able to navigate one stair to enter/exit his home with his rollator and min assist which required him to remain standing for ~45min to complete. Pt continues to struggle with R hip pain which is limiting his ability to stand statically for more than ~45min even while performing a task.  Patient continues to demonstrate the following deficits muscle weakness and muscle joint tightness, decreased cardiorespiratoy endurance and decreased standing balance and decreased balance strategies and therefore will continue to benefit from skilled PT intervention to increase functional independence with mobility.  Patient progressing toward long term goals..  Continue plan of care.  PT Short Term Goals Week 2:  PT Short Term Goal 1 (Week 2): Patient will ambulate 60' with rollator and S. PT Short Term Goal 1 - Progress (Week 2): Met PT Short Term Goal 2 (Week 2): Patient will negotiate 1 step with rollator and min A for home entry. PT Short Term Goal 2 - Progress (Week 2): Met PT Short Term Goal 3 (Week 2): Patient will demonstrate bed mobility with min A 50% of the time. PT Short Term Goal 3 - Progress (Week 2): Met PT Short Term Goal 4 (Week 2): Patient will tolerate 3 minutes standing for functional task with S with intermittent UE support. PT Short Term Goal 4 - Progress (Week 2): Met Week 3:  PT Short Term Goal 1 (Week 3): STGs=LTGs  Skilled Therapeutic Interventions/Progress Updates:    Pt received in  [facility] with nurse in room administering medication. Pt agreeable to PT with a request to perform personal hygiene at sink prior to session. PT assisted with oral and facial hygiene with set up assist.   Pt propelled WC ~12ft to main therapy gym with supervision, then ambulated ~41ft with rollator and supervision having to navigate a doorway and a R turn into the hallway. Pt requesting to sit in [facility] and is transported to [place] in ortho gym total assist for energy conservation.   Standing Tolerance at BITS: -Pt attempted to participate in recreating patterns in his rollator, however stated that he was too far from the screen to complete as his R hip was bothering him when leaning. PT replaced his rollator with a RW and pt was able to stand for ~40sec.  -Bell cancellation test: pt stood for ~89min and ~25sec while completing half of the bell cancellation test. Pt continues to complain of R hip pain, but does not offer rating. He only states that it has "been hurting for a week and it isn't getting any better". -extra time allowed for breaks and setup  Pt transported in [facility] to main gym total assist.   Pt agreeable to attempting 1 stair to replicate his home entry. Pt instructed in how to perform stairs with his rollator. Pt able to ascend/descend 6in curb step without a rest break between ascending and descending. Pt requiring min assist under glutes for step up and verbal cueing for locking his rollator for safety. Pt requesting to sit  following descent.   Pt agreeable to attempt ambulation again prior to end of session. Pt ambulated with RW and supervision ~98f with supervision prior to resting. [facility], needs met, and call bell in reach.   Pt performed ~10 sit<>stands throughout session with supervision in order to complete tasks.   Therapy Documentation Precautions:  Precautions Precautions: Back Required Braces or Orthoses:  Spinal Brace Spinal Brace: Lumbar corset, Applied in sitting position Restrictions Weight Bearing Restrictions: No   Therapy/Group: Individual Therapy  BGaylord Shih SPT 02/29/2020, 9:21 AM

## 2020-02-29 NOTE — Progress Notes (Signed)
[place] PHYSICAL MEDICINE & REHABILITATION PROGRESS NOTE   Subjective/Complaints: Patient seen sitting up in a chair working with therapy this morning.  He states he slept well overnight.  He states he still feels like he has a UTI, because he has some back pain.  He denies any other symptoms.  He states he left his urine for me to evaluate.  He notes improvement in bowel function.  ROS: Denies CP, SOB, N/V/D  Objective:   No results found. No results for input(s): WBC, HGB, HCT, PLT in the last 72 hours. Recent Labs    02/27/20 0423  NA 142  K 3.3*  CL 104  CO2 26  GLUCOSE 166*  BUN 27*  CREATININE 1.63*  CALCIUM 9.0    Intake/Output Summary (Last 24 hours) at 02/29/2020 1042 Last data filed at 02/29/2020 0730 Gross per 24 hour  Intake 740 ml  Output 1050 ml  Net -310 ml        Physical Exam: Vital Signs Blood pressure (!) 149/69, pulse 72, temperature 97.9 F (36.6 C), temperature source Oral, resp. rate 18, height 5\' 8"  (1.727 m), weight 101 kg, SpO2 97 %. Constitutional: No distress . Vital signs reviewed. HENT: Normocephalic.  Atraumatic. Eyes: EOMI. No discharge. Cardiovascular: No JVD.  RRR. Respiratory: Normal effort.  No stridor.  Bilateral clear to auscultation. GI: Non-distended.  BS +. Skin: Warm and dry.  Intact. Psych: Normal mood.  Normal behavior. Musc: No edema in extremities.  No tenderness in extremities. Neuro: Alert Motor: Bilateral upper extremities: 4+/5 proximal distal Right lower extremity: Hip flexion, knee extension, ankle dorsiflexion 4/5, stable Left lower extremity: Hip flexion, knee extension 4/5, ankle dorsiflexion 4 -/5, stable  Assessment/Plan: 1. Functional deficits due to spinal stenosis, lumbar radiculopathy  per day of interdisciplinary therapy in a comprehensive inpatient rehab setting.  Physiatrist is providing close team supervision and 24 hour management of active medical problems listed below.  Physiatrist and  rehab team continue to assess barriers to discharge/monitor patient progress toward functional and medical goals  Care Tool:  Bathing    Body parts bathed by patient: Right arm, Left arm, Chest, Front perineal area, Abdomen, Left upper leg, Right upper leg, Right lower leg, Left lower leg, Face   Body parts bathed by helper: Buttocks     Bathing assist Assist Level: Minimal Assistance - Patient > 75%     Upper Body Dressing/Undressing Upper body dressing   What is the patient wearing?: Pull over shirt, Orthosis    Upper body assist Assist Level: Minimal Assistance - Patient > 75%    Lower Body Dressing/Undressing Lower body dressing      What is the patient wearing?: Pants     Lower body assist Assist for lower body dressing: Moderate Assistance - Patient 50 - 74%     Toileting Toileting    Toileting assist Assist for toileting: Moderate Assistance - Patient 50 - 74%     Transfers Chair/bed transfer  Transfers assist     Chair/bed transfer assist level: Contact Guard/Touching assist     Locomotion Ambulation   Ambulation assist      Assist level: Contact Guard/Touching assist Assistive device: Rollator Max distance: 40'   Walk 10 feet activity   Assist  Walk 10 feet activity did not occur: Safety/medical concerns  Assist level: Contact Guard/Touching assist Assistive device: Rollator   Walk 50 feet activity   Assist Walk 50 feet with 2 turns activity did not occur: Safety/medical concerns  Walk 150 feet activity   Assist Walk 150 feet activity did not occur: Safety/medical concerns         Walk 10 feet on uneven surface  activity   Assist Walk 10 feet on uneven surfaces activity did not occur: Safety/medical concerns   Assist level: Contact Guard/Touching assist Assistive device: Rollator   Wheelchair     Assist Will patient use wheelchair at discharge?: Yes Type of Wheelchair: Manual    Wheelchair assist  level: Supervision/Verbal cueing Max wheelchair distance: 90    Wheelchair 50 feet with 2 turns activity    Assist    Wheelchair 50 feet with 2 turns activity did not occur: Safety/medical concerns   Assist Level: Supervision/Verbal cueing   Wheelchair 150 feet activity     Assist  Wheelchair 150 feet activity did not occur: Safety/medical concerns       Blood pressure (!) 149/69, pulse 72, temperature 97.9 F (36.6 C), temperature source Oral, resp. rate 18, height 5\' 8"  (1.727 m), weight 101 kg, SpO2 97 %.  Medical Problem List and Plan: 1.  Decreased functional mobility secondary to L3-4 4-5 spondylolisthesis arthropathy with radiculopathy/spinal stenosis.  Status post bilateral L3-4 4-5 laminotomy foraminotomies decompression interbody fusion 02/10/2020.  Back corset when out of bed  Continue CIR, 15/7 2.  Antithrombotics: -DVT/anticoagulation: SCDs.    Lovenox             -antiplatelet therapy: Plavix resumed 3. Pain Management: Flexeril as needed, Dilaudid 2-4 mg every 4 hours as needed pain  RIght post hip pain , likely sacroiliac- cont PT, topical analgesic   Relatively controlled with meds on 11/27 4. Mood: Provide emotional support             -antipsychotic agents: N/A 5. Neuropsych: This patient is capable of making decisions on his own behalf. 6. Skin/Wound Care: Routine skin checks 7. Fluids/Electrolytes/Nutrition: Routine in and outs 8.  Acute on chronic anemia.    Hemoglobin 11.3 on 11/22  Continue to monitor 9.  CAD with CABG/ICD.  Amiodarone 200 mg twice daily, Imdur 30 mg daily, Toprol 25 mg daily 10  COPD.  Check oxygen saturations every shift 11.  CKD stage III with history of right nephrectomy. Has single L kidney  Baseline creatinine 1.47-1.50  Creatinine 1.63 on 11/25, BUN improving-labs ordered for Monday  12.  Diabetes mellitus with hyperglycemia.  Hemoglobin A1c 5.9.  Currently on Tradjenta 5 mg daily.  Patient on Levemir 10 units daily  prior to admission.  Resume as needed  Levemir 5 daily started on 11/27 13.  Chronic systolic congestive heart failure.  Entresto 49-51 mg twice daily, Lasix 40 mg twice daily.  Monitor for any signs of fluid overload. Filed Weights   02/27/20 0500 02/28/20 0539 02/29/20 0410  Weight: 102.7 kg 103.2 kg 101 kg   Stable on 11/27 14.  Hypertension.  Monitor with increased mobility   Vitals:   02/28/20 1812 02/29/20 0413  BP: (!) 142/65 (!) 149/69  Pulse: 74 72  Resp: 20 18  Temp: 98.7 F (37.1 C) 97.9 F (36.6 C)  SpO2: 98% 97%   On lasix, entresto, toprol   Relatively controlled on 11/27 15.  Hyperlipidemia.  Lipitor 16.  Hypothyroidism.  Synthroid 17.  BPH.  Flomax 0.4 mg daily.    ?  Improving 18.  Sleep disturbance: Continue melatonin 6 mg QHS (PTA medication) 19. Hypokalemia  Potassium 3.3 on 11/25, labs ordered for Monday  Magnesium on Monday   Daily supplement  initiated 20.  Drug-induced constipation  Bowel meds increased on 11/15, increased again on 11/26.   Improving 21.?  UTI  Patient complaining of back pain and states this is typical symptom with UTI, denies other symptoms  UA ordered per patient    LOS: 16 days A FACE TO FACE EVALUATION WAS PERFORMED  Cregg Jutte Lorie Phenix 02/29/2020, 10:42 AM

## 2020-02-29 NOTE — Plan of Care (Signed)
  Problem: Consults Goal: RH SPINAL CORD INJURY PATIENT EDUCATION Description:  See Patient Education module for education specifics.  Outcome: Progressing Goal: Skin Care Protocol Initiated - if Braden Score 18 or less Description: If consults are not indicated, leave blank or document N/A Outcome: Progressing Goal: Nutrition Consult-if indicated Outcome: Progressing Goal: Diabetes Guidelines if Diabetic/Glucose > 140 Description: If diabetic or lab glucose is > 140 mg/dl - Initiate Diabetes/Hyperglycemia Guidelines & Document Interventions  Outcome: Progressing   Problem: SCI BOWEL ELIMINATION Goal: RH STG MANAGE BOWEL WITH ASSISTANCE Description: STG Manage Bowel with  moderate Assistance. Outcome: Progressing Goal: RH STG SCI MANAGE BOWEL WITH MEDICATION WITH ASSISTANCE Description: STG SCI Manage bowel with medication with moderate assistance. Outcome: Progressing Goal: RH OTHER STG BOWEL ELIMINATION GOALS W/ASSIST Description: Other STG Bowel Elimination Goals With moderate Assistance. Outcome: Progressing   Problem: SCI BLADDER ELIMINATION Goal: RH STG MANAGE BLADDER WITH ASSISTANCE Description: STG Manage Bladder With moderate Assistance Outcome: Progressing   Problem: RH SKIN INTEGRITY Goal: RH STG SKIN FREE OF INFECTION/BREAKDOWN Outcome: Progressing   Problem: RH SAFETY Goal: RH STG ADHERE TO SAFETY PRECAUTIONS W/ASSISTANCE/DEVICE Description: STG Adhere to Safety Precautions With  moderate Assistance/Device. Outcome: Progressing   Problem: RH PAIN MANAGEMENT Goal: RH STG PAIN MANAGED AT OR BELOW PT'S PAIN GOAL Description: Pain less than 2 Outcome: Progressing   Problem: RH KNOWLEDGE DEFICIT SCI Goal: RH STG INCREASE KNOWLEDGE OF SELF CARE AFTER SCI Description: Moderate assistance regarding self knowledge after SCI Outcome: Progressing

## 2020-03-01 ENCOUNTER — Inpatient Hospital Stay (HOSPITAL_COMMUNITY): Payer: Medicare Other | Admitting: Physical Therapy

## 2020-03-01 ENCOUNTER — Inpatient Hospital Stay (HOSPITAL_COMMUNITY): Payer: Medicare Other | Admitting: Occupational Therapy

## 2020-03-01 DIAGNOSIS — R8290 Unspecified abnormal findings in urine: Secondary | ICD-10-CM

## 2020-03-01 LAB — GLUCOSE, CAPILLARY
Glucose-Capillary: 134 mg/dL — ABNORMAL HIGH (ref 70–99)
Glucose-Capillary: 127 mg/dL — ABNORMAL HIGH (ref 70–99)
Glucose-Capillary: 161 mg/dL — ABNORMAL HIGH (ref 70–99)
Glucose-Capillary: 104 mg/dL — ABNORMAL HIGH (ref 70–99)

## 2020-03-01 NOTE — Plan of Care (Signed)
  Problem: Consults Goal: RH SPINAL CORD INJURY PATIENT EDUCATION Description:  See Patient Education module for education specifics.  Outcome: Progressing Goal: Skin Care Protocol Initiated - if Braden Score 18 or less Description: If consults are not indicated, leave blank or document N/A Outcome: Progressing Goal: Nutrition Consult-if indicated Outcome: Progressing Goal: Diabetes Guidelines if Diabetic/Glucose > 140 Description: If diabetic or lab glucose is > 140 mg/dl - Initiate Diabetes/Hyperglycemia Guidelines & Document Interventions  Outcome: Progressing   Problem: SCI BOWEL ELIMINATION Goal: RH STG MANAGE BOWEL WITH ASSISTANCE Description: STG Manage Bowel with  moderate Assistance. Outcome: Progressing Goal: RH STG SCI MANAGE BOWEL WITH MEDICATION WITH ASSISTANCE Description: STG SCI Manage bowel with medication with moderate assistance. Outcome: Progressing Goal: RH OTHER STG BOWEL ELIMINATION GOALS W/ASSIST Description: Other STG Bowel Elimination Goals With moderate Assistance. Outcome: Progressing   Problem: SCI BLADDER ELIMINATION Goal: RH STG MANAGE BLADDER WITH ASSISTANCE Description: STG Manage Bladder With moderate Assistance Outcome: Progressing   Problem: RH SKIN INTEGRITY Goal: RH STG SKIN FREE OF INFECTION/BREAKDOWN Outcome: Progressing   Problem: RH SAFETY Goal: RH STG ADHERE TO SAFETY PRECAUTIONS W/ASSISTANCE/DEVICE Description: STG Adhere to Safety Precautions With  moderate Assistance/Device. Outcome: Progressing   Problem: RH PAIN MANAGEMENT Goal: RH STG PAIN MANAGED AT OR BELOW PT'S PAIN GOAL Description: Pain less than 2 Outcome: Progressing   Problem: RH KNOWLEDGE DEFICIT SCI Goal: RH STG INCREASE KNOWLEDGE OF SELF CARE AFTER SCI Description: Moderate assistance regarding self knowledge after SCI Outcome: Progressing

## 2020-03-01 NOTE — Progress Notes (Signed)
[place] PHYSICAL MEDICINE & REHABILITATION PROGRESS NOTE   Subjective/Complaints: Patient seen sitting up in his chair this morning.  He states he slept well overnight.  He is in good spirits.  He states he had 2 bowel movements 2 days ago and a bowel movement yesterday.  He has questions regarding results of his urine study.  He states his symptoms have resolved.  ROS: Denies CP, SOB, N/V/D  Objective:   No results found. No results for input(s): WBC, HGB, HCT, PLT in the last 72 hours. No results for input(s): NA, K, CL, CO2, GLUCOSE, BUN, CREATININE, CALCIUM in the last 72 hours.  Intake/Output Summary (Last 24 hours) at 03/01/2020 0857 Last data filed at 03/01/2020 0805 Gross per 24 hour  Intake 1060 ml  Output 225 ml  Net 835 ml        Physical Exam: Vital Signs Blood pressure (!) 144/73, pulse 60, temperature 97.9 F (36.6 C), temperature source Oral, resp. rate 20, height 5\' 8"  (1.727 m), weight 101 kg, SpO2 100 %. Constitutional: No distress . Vital signs reviewed. HENT: Normocephalic.  Atraumatic. Eyes: EOMI. No discharge. Cardiovascular: No JVD.  RRR. Respiratory: Normal effort.  No stridor.  Bilateral clear to auscultation. GI: Non-distended.  BS +. Skin: Warm and dry.  Intact. Psych: Normal mood.  Normal behavior. Musc: No edema in extremities.  No tenderness in extremities. Neuro: Alert Motor: Bilateral upper extremities: 4+/5 proximal distal Right lower extremity: Hip flexion, knee extension, ankle dorsiflexion 4/5 Left lower extremity: Hip flexion, knee extension 4/5, ankle dorsiflexion 4/5  Assessment/Plan: 1. Functional deficits due to spinal stenosis, lumbar radiculopathy  per day of interdisciplinary therapy in a comprehensive inpatient rehab setting.  Physiatrist is providing close team supervision and 24 hour management of active medical problems listed below.  Physiatrist and rehab team continue to assess barriers to discharge/monitor patient  progress toward functional and medical goals  Care Tool:  Bathing    Body parts bathed by patient: Right arm, Left arm, Chest, Front perineal area, Abdomen, Left upper leg, Right upper leg, Right lower leg, Left lower leg, Face   Body parts bathed by helper: Buttocks     Bathing assist Assist Level: Minimal Assistance - Patient > 75%     Upper Body Dressing/Undressing Upper body dressing   What is the patient wearing?: Pull over shirt, Orthosis    Upper body assist Assist Level: Minimal Assistance - Patient > 75%    Lower Body Dressing/Undressing Lower body dressing      What is the patient wearing?: Pants     Lower body assist Assist for lower body dressing: Moderate Assistance - Patient 50 - 74%     Toileting Toileting    Toileting assist Assist for toileting: Moderate Assistance - Patient 50 - 74%     Transfers Chair/bed transfer  Transfers assist     Chair/bed transfer assist level: Contact Guard/Touching assist     Locomotion Ambulation   Ambulation assist      Assist level: Contact Guard/Touching assist Assistive device: Rollator Max distance: 40'   Walk 10 feet activity   Assist  Walk 10 feet activity did not occur: Safety/medical concerns  Assist level: Contact Guard/Touching assist Assistive device: Rollator   Walk 50 feet activity   Assist Walk 50 feet with 2 turns activity did not occur: Safety/medical concerns         Walk 150 feet activity   Assist Walk 150 feet activity did not occur: Safety/medical concerns  Walk 10 feet on uneven surface  activity   Assist Walk 10 feet on uneven surfaces activity did not occur: Safety/medical concerns   Assist level: Contact Guard/Touching assist Assistive device: Rollator   Wheelchair     Assist Will patient use wheelchair at discharge?: Yes Type of Wheelchair: Manual    Wheelchair assist level: Supervision/Verbal cueing Max wheelchair distance: 90     Wheelchair 50 feet with 2 turns activity    Assist    Wheelchair 50 feet with 2 turns activity did not occur: Safety/medical concerns   Assist Level: Supervision/Verbal cueing   Wheelchair 150 feet activity     Assist  Wheelchair 150 feet activity did not occur: Safety/medical concerns       Blood pressure (!) 144/73, pulse 60, temperature 97.9 F (36.6 C), temperature source Oral, resp. rate 20, height 5\' 8"  (1.727 m), weight 101 kg, SpO2 100 %.  Medical Problem List and Plan: 1.  Decreased functional mobility secondary to L3-4 4-5 spondylolisthesis arthropathy with radiculopathy/spinal stenosis.  Status post bilateral L3-4 4-5 laminotomy foraminotomies decompression interbody fusion 02/10/2020.  Back corset when out of bed  Continue CIR, 15/7 2.  Antithrombotics: -DVT/anticoagulation: SCDs.    Lovenox             -antiplatelet therapy: Plavix resumed 3. Pain Management: Flexeril as needed, Dilaudid 2-4 mg every 4 hours as needed pain  RIght post hip pain , likely sacroiliac- cont PT, topical analgesic   Relatively controlled with meds on 11/28 4. Mood: Provide emotional support             -antipsychotic agents: N/A 5. Neuropsych: This patient is capable of making decisions on his own behalf. 6. Skin/Wound Care: Routine skin checks 7. Fluids/Electrolytes/Nutrition: Routine in and outs 8.  Acute on chronic anemia.    Hemoglobin 11.3 on 11/22  Continue to monitor 9.  CAD with CABG/ICD.  Amiodarone 200 mg twice daily, Imdur 30 mg daily, Toprol 25 mg daily 10  COPD.  Check oxygen saturations every shift 11.  CKD stage III with history of right nephrectomy. Has single L kidney  Baseline creatinine 1.47-1.50  Creatinine 1.63 on 11/25, BUN improving-labs ordered for tomorrow 12.  Diabetes mellitus with hyperglycemia.  Hemoglobin A1c 5.9.  Currently on Tradjenta 5 mg daily.  Patient on Levemir 10 units daily prior to admission.  Resume as needed  Levemir 5 daily started  on 11/27  Remains elevated on 11/28, not make further changes today given recent increase. 13.  Chronic systolic congestive heart failure.  Entresto 49-51 mg twice daily, Lasix 40 mg twice daily.  Monitor for any signs of fluid overload. Filed Weights   02/28/20 0539 02/29/20 0410 03/01/20 0603  Weight: 103.2 kg 101 kg 101 kg   Stable on 11/28 14.  Hypertension.  Monitor with increased mobility   Vitals:   02/29/20 1948 03/01/20 0603  BP: (!) 136/59 (!) 144/73  Pulse: 60 60  Resp: 20 20  Temp: 97.8 F (36.6 C) 97.9 F (36.6 C)  SpO2: 99% 100%   On lasix, entresto, toprol   Relatively controlled on 11/28 15.  Hyperlipidemia.  Lipitor 16.  Hypothyroidism.  Synthroid 17.  BPH.  Flomax 0.4 mg daily.    ?  Improving 18.  Sleep disturbance: Continue melatonin 6 mg QHS (PTA medication) 19. Hypokalemia  Potassium 3.3 on 11/25, labs ordered for tomorrow  Magnesium on Monday   Daily supplement initiated 20.  Drug-induced constipation  Bowel meds increased on 11/15, increased  again on 11/26.   Improving 21. ? UTI  Patient complaining of back pain and states this is typical symptom with UTI, denies other symptoms.  All symptoms resolved  UA?+, Urine culture pending, however given resolution of "symptoms" would consider holding treatment regardless    LOS: 17 days A FACE TO FACE EVALUATION WAS PERFORMED  Gabriana Wilmott Lorie Phenix 03/01/2020, 8:57 AM

## 2020-03-01 NOTE — Progress Notes (Signed)
MD Posey Pronto reviewed UA lab results with patient this morning. MD stated that he would like to proceed with culture. Nurse informed MD that culture was sent down 02/29/2020 with UA sample. Lab called and stated that they need requisition for culture and that tube is viable for three days. Requisition sent to lab and awaiting results. MD Patel aware. Sanda Linger, LPN

## 2020-03-01 NOTE — Progress Notes (Signed)
Occupational Therapy Session Note  Patient Details  Name: James [name]. MRN: [id number] Date of Birth: 14-Dec-1950  Today's Date: 03/01/2020 OT Individual Time: 5872-7618 OT Individual Time Calculation (min): 77 min    Short Term Goals: Week 3:  OT Short Term Goal 1 (Week 3): STGs = LTGs d/t ELOS  Skilled Therapeutic Interventions/Progress Updates:    Patient seated in w/c, alert and ready for therapy session.  He denies pain.  Oral care completed seated at sink.  Incision covered in prep for shower.  He ambulated with rollator to/from commode, w/c and shower bench with CGA, min cues for technique.  toileting completed with mod A for clothing management and max A for hygiene after BM.  Shower completed with hand held shower, long handled sponge - min a for lower legs and buttocks.  Dressing completed seated in w/c - set up for [facility] shirt, mod A pants, dependent for compression socks and slipper socks.  Overall brace management min A.   Completed ambulation on unit with CGA approx 100 feet x 2.  He returned to w/c at close of session, seat belt alarm set and call bell in hand.    Therapy Documentation Precautions:  Precautions Precautions: Back Required Braces or Orthoses: Spinal Brace Spinal Brace: Lumbar corset, Applied in sitting position Restrictions Weight Bearing Restrictions: No   Therapy/Group: Individual Therapy  Carlos Levering 03/01/2020, 7:30 AM

## 2020-03-01 NOTE — Progress Notes (Signed)
Physical Therapy Session Note  Patient Details  Name: James [name]. MRN: [id number] Date of Birth: 05-07-1950  Today's Date: 03/01/2020 PT Individual Time: 1120-1205 PT Individual Time Calculation (min): 45 min  PT Amount of Missed Time (min): 15 Minutes PT Missed Treatment Reason: Patient fatigue  Short Term Goals: Week 3:  PT Short Term Goal 1 (Week 3): STGs=LTGs  Skilled Therapeutic Interventions/Progress Updates:    Attempted to see patient at scheduled therapy time of 11:00, pt unaware of therapy schedule this date and requests this therapists return after 15 min to he can wake up. This therapist returns after 15 min pt agreeable to participate in therapy session. Pt reports some pain in L hip at rest, requests pain medication prior to start of therapy session. Per nursing pt does not have pain medication due to until 13:00, pt agreeable to proceed with session. Manual w/c propulsion 2 x 100 ft with use of BUE at Supervision level with increased time needed to complete. Sit to stand with CGA to rollator throughout session. Ambulation 2 x 40 ft with rollator and CGA for balance. Pt requires significantly increased time to perform turns to sit down either on rollator or in chair. Pt reports being fearful of taking steps backwards to back up to chair to sit due to fear of LE giving out when backing up. Forwards/backwards ambulation 2 x 10 ft with rollator and CGA for balance, no instances of LE giving out. Pt exhibits decreased fear with backing following practice. Pt agreeable to stay seated in w/c at end of session, needs in reach, quick release belt and chair alarm in place. Pt missed 15 min of scheduled therapy session due to fatigue this date.  Therapy Documentation Precautions:  Precautions Precautions: Back Required Braces or Orthoses: Spinal Brace Spinal Brace: Lumbar corset, Applied in sitting position Restrictions Weight Bearing Restrictions: No General: PT Amount of Missed  Time (min): 15 Minutes PT Missed Treatment Reason: Patient fatigue    Therapy/Group: Individual Therapy   Excell Seltzer, PT, DPT  03/01/2020, 12:09 PM

## 2020-03-02 ENCOUNTER — Encounter (HOSPITAL_COMMUNITY): Payer: Medicare Other | Admitting: Occupational Therapy

## 2020-03-02 ENCOUNTER — Ambulatory Visit (HOSPITAL_COMMUNITY): Payer: Medicare Other | Admitting: Physical Therapy

## 2020-03-02 ENCOUNTER — Inpatient Hospital Stay (HOSPITAL_COMMUNITY): Payer: Medicare Other | Admitting: Physical Therapy

## 2020-03-02 LAB — CBC WITH DIFFERENTIAL/PLATELET
Abs Immature Granulocytes: 0.04 10*3/uL (ref 0.00–0.07)
Basophils Absolute: 0.1 10*3/uL (ref 0.0–0.1)
Basophils Relative: 1 %
Eosinophils Absolute: 0.1 10*3/uL (ref 0.0–0.5)
Eosinophils Relative: 2 %
HCT: 34.4 % — ABNORMAL LOW (ref 39.0–52.0)
Hemoglobin: 11 g/dL — ABNORMAL LOW (ref 13.0–17.0)
Immature Granulocytes: 1 %
Lymphocytes Relative: 19 %
Lymphs Abs: 1.4 10*3/uL (ref 0.7–4.0)
MCH: 28.7 pg (ref 26.0–34.0)
MCHC: 32 g/dL (ref 30.0–36.0)
MCV: 89.8 fL (ref 80.0–100.0)
Monocytes Absolute: 1.1 10*3/uL — ABNORMAL HIGH (ref 0.1–1.0)
Monocytes Relative: 15 %
Neutro Abs: 4.6 10*3/uL (ref 1.7–7.7)
Neutrophils Relative %: 62 %
Platelets: 271 10*3/uL (ref 150–400)
RBC: 3.83 MIL/uL — ABNORMAL LOW (ref 4.22–5.81)
RDW: 13.6 % (ref 11.5–15.5)
WBC: 7.4 10*3/uL (ref 4.0–10.5)
nRBC: 0 % (ref 0.0–0.2)

## 2020-03-02 LAB — BASIC METABOLIC PANEL
Anion gap: 10 (ref 5–15)
BUN: 29 mg/dL — ABNORMAL HIGH (ref 8–23)
CO2: 26 mmol/L (ref 22–32)
Calcium: 9 mg/dL (ref 8.9–10.3)
Chloride: 102 mmol/L (ref 98–111)
Creatinine, Ser: 1.68 mg/dL — ABNORMAL HIGH (ref 0.61–1.24)
GFR, Estimated: 44 mL/min — ABNORMAL LOW (ref >60)
Glucose, Bld: 130 mg/dL — ABNORMAL HIGH (ref 70–99)
Potassium: 3.4 mmol/L — ABNORMAL LOW (ref 3.5–5.1)
Sodium: 138 mmol/L (ref 135–145)

## 2020-03-02 LAB — GLUCOSE, CAPILLARY
Glucose-Capillary: 138 mg/dL — ABNORMAL HIGH (ref 70–99)
Glucose-Capillary: 121 mg/dL — ABNORMAL HIGH (ref 70–99)
Glucose-Capillary: 159 mg/dL — ABNORMAL HIGH (ref 70–99)

## 2020-03-02 LAB — MAGNESIUM: Magnesium: 2 mg/dL (ref 1.7–2.4)

## 2020-03-02 MED ORDER — POTASSIUM CHLORIDE CRYS ER 20 MEQ PO TBCR
40.0000 meq | EXTENDED_RELEASE_TABLET | Freq: Once | ORAL | Status: AC
  Administered 2020-03-02: 40 meq via ORAL
  Filled 2020-03-02: qty 2

## 2020-03-02 MED ORDER — SODIUM CHLORIDE 0.9 % IV SOLN: INTRAVENOUS | Status: AC

## 2020-03-02 NOTE — Progress Notes (Signed)
Patient ID: James [name]., male   DOB: [birth year], 69 y.o.   MRN: [id number]  This SW covering for primary SW, [place].  SW received updates from Amy[facility] pt referral accepted.  Loralee Pacas, MSW, [place] Office: [phone] Cell: [phone] Fax: [phone]

## 2020-03-02 NOTE — Progress Notes (Signed)
Physical Therapy Session Note  Patient Details  Name: James [name]. MRN: [id number] Date of Birth: 1950/09/10  Today's Date: 03/02/2020 PT Individual Time: 1030-1100; 1400-1500 PT Individual Time Calculation (min): 30 min and 60 min  Short Term Goals: Week 3:  PT Short Term Goal 1 (Week 3): STGs=LTGs  Skilled Therapeutic Interventions/Progress Updates:    Session 1: Pt received seated in w/c in room, agreeable to PT session. Pt reports ongoing pain in R hip region, not rated and reports being premedicated prior to start of therapy session. Pt declines offer of heat or ice for pain management. Sit to stand with CGA to rollator throughout session. Ambulation x 40 ft with rollator and CGA. When turning to sit down in chair pt has posterior LOB, unable to correct and has uncontrolled descent into chair. Pt exhibits increased anxiety when experiencing LOB and unable to follow cues to safely recover balance. Second bout of ambulation x 40 ft with rollator and CGA. When turning to sit down in w/c this trial pt again exhibits anxiety and fear of falling and attempts to sit down in chair before fully turned around and backed up to chair. Education with patient about importance of safely sitting and lining up with chair prior to sitting. Pt reports he felt his R hip was going to give out on him. Pt also found to be incontinent in brief of stool. Pt able to stand with Supervision and rollator while dependent pericare and brief change performed. Pt left seated in w/c in room with needs in reach, quick release belt and chair alarm in place at end of session.  Session 2: Pt received seated in w/c in room with wife James [name] present for hands-on family education session. Pt is CGA for sit to stand to rollator throughout session. Demonstrated how to safely ascend/descend 6" curb step with rollator. Pt able to perform return demo with therapist and with wife with min to mod A needed for balance. Pt exhibits one  instance of near LOB posteriorly requiring mod A and cueing to recover balance before continuing with curb navigation. Per pt's wife pt does not need to navigate this step to enter the home but to enter his den area that he enjoys spending time in. Car transfer with min A for some RLE management and use of rollator. Pt's wife demonstrates good ability to assist him safely with car transfer. Sit to semi-reclined on bed at Supervision level with use of bedrail, HOB elevated, and leg lifter. Per pt and family report he uses RW next to bed as bedrail, has wedges and pillows for HOB elevation, and has a leg lifter at home. Pt demos improved ability to maneuver in/out of bed with use of leg lifter. Pt returned to w/c with use of rollator at [facility] level. Pt exhibits improved ability to transfer safely this PM. Pt's wife demonstrates good ability to safely assist him, no further questions. Pt left seated in w/c in room with needs in reach, quick release belt and chair alarm in place at end of session.  Therapy Documentation Precautions:  Precautions Precautions: Back Required Braces or Orthoses: Spinal Brace Spinal Brace: Lumbar corset, Applied in sitting position Restrictions Weight Bearing Restrictions: No   Therapy/Group: Individual Therapy   Excell Seltzer, PT, DPT  03/02/2020, 12:18 PM

## 2020-03-02 NOTE — Progress Notes (Signed)
Occupational Therapy Session Note  Patient Details  Name: [name]. MRN: [id number] Date of Birth: 06-Jun-1950  Today's Date: 03/02/2020 OT Individual Time: 1300-1345 OT Individual Time Calculation (min): 45 min    Short Term Goals: Week 3:  OT Short Term Goal 1 (Week 3): STGs = LTGs d/t ELOS  Skilled Therapeutic Interventions/Progress Updates:    Pt greeted at time of session sitting up in wheelchair with wife [name] present for family ed, remained throughout session. Discussed recent feelings that pt has that he has declined, balance has not been as good last few days and he had LOB with PT this am. Pt walked to/from bathroom with rollator CGA and transferred to toilet in same manner, Min A to stand up from low surface as there was no BSC over top. Transported to tub shower room via wheelchair and pt performed TTB transfer after therapist demonstration with CGA, wife present and providing CGA throughout. She is aware of his posterior lean and educated to remain on R side to prevent falls. D/t time constraints, simulated and demonstrated AE use for bathing/dressing tasks to adhere to back precautions. Also demonstrated donning/doffing back support with wife teach back. Pt back in room with alamr on, call bell in reach.   Therapy Documentation Precautions:  Precautions Precautions: Back Required Braces or Orthoses: Spinal Brace Spinal Brace: Lumbar corset, Applied in sitting position Restrictions Weight Bearing Restrictions: No     Therapy/Group: Individual Therapy  Viona Gilmore 03/02/2020, 4:47 PM

## 2020-03-02 NOTE — Progress Notes (Signed)
[place] PHYSICAL MEDICINE & REHABILITATION PROGRESS NOTE   Subjective/Complaints:  Pt reports he admits he just cannot "drink enough" for his wife- on phone, she expressed concern that he doesn't drink enough- I said I would give IVFs to help- since sometimes it's just hard to drink "enough".   Also, has UTI based on U/A- will get Cx done Is pending- >100k GNRs  Also, admits might be confused at night due ot dilaudid- has called wife at night- ~ 3am because didn't know what time it was.   Still having R hip pain- will see if possible to get radiology to do SI joint injection on R before d/c, but supposed to go Wednesday.    ROS:  Pt denies SOB, abd pain, CP, N/V/C/D, and vision changes   Objective:   No results found. Recent Labs    03/02/20 0644  WBC 7.4  HGB 11.0*  HCT 34.4*  PLT 271   Recent Labs    03/02/20 0644  NA 138  K 3.4*  CL 102  CO2 26  GLUCOSE 130*  BUN 29*  CREATININE 1.68*  CALCIUM 9.0    Intake/Output Summary (Last 24 hours) at 03/02/2020 0942 Last data filed at 03/02/2020 0847 Gross per 24 hour  Intake 720 ml  Output 650 ml  Net 70 ml        Physical Exam: Vital Signs Blood pressure 126/66, pulse (!) 59, temperature 98.6 F (37 C), temperature source Oral, resp. rate 18, height 5\' 8"  (1.727 m), weight 103 kg, SpO2 100 %. Constitutional: sitting up in bed- appropriate, on phone with wife, NAD- doesn't appear confused currently HENT: Normocephalic.  Atraumatic. Eyes: EOMI. No discharge. Cardiovascular: borderline bradycardia- regular rhythm Respiratory: CTA B/L- no W/R/R- good air movement GI: Soft, NT, ND, (+)BS  Skin: Warm and dry.  Intact. Psych: Normal mood.  Normal behavior. Musc: No edema in extremities.  No tenderness in extremities. Neuro: Alert Motor: Bilateral upper extremities: 4+/5 proximal distal Right lower extremity: Hip flexion, knee extension, ankle dorsiflexion 4/5 Left lower extremity: Hip flexion, knee  extension 4/5, ankle dorsiflexion 4/5  Assessment/Plan: 1. Functional deficits due to spinal stenosis, lumbar radiculopathy  per day of interdisciplinary therapy in a comprehensive inpatient rehab setting.  Physiatrist is providing close team supervision and 24 hour management of active medical problems listed below.  Physiatrist and rehab team continue to assess barriers to discharge/monitor patient progress toward functional and medical goals  Care Tool:  Bathing    Body parts bathed by patient: Right arm, Left arm, Chest, Front perineal area, Abdomen, Left upper leg, Right upper leg, Right lower leg, Left lower leg, Face   Body parts bathed by helper: Buttocks     Bathing assist Assist Level: Minimal Assistance - Patient > 75%     Upper Body Dressing/Undressing Upper body dressing   What is the patient wearing?: Pull over shirt, Orthosis    Upper body assist Assist Level: Minimal Assistance - Patient > 75%    Lower Body Dressing/Undressing Lower body dressing      What is the patient wearing?: Pants     Lower body assist Assist for lower body dressing: Moderate Assistance - Patient 50 - 74%     Toileting Toileting    Toileting assist Assist for toileting: Moderate Assistance - Patient 50 - 74%     Transfers Chair/bed transfer  Transfers assist     Chair/bed transfer assist level: Contact Guard/Touching assist     Locomotion Ambulation  Ambulation assist      Assist level: Contact Guard/Touching assist Assistive device: Rollator Max distance: 40'   Walk 10 feet activity   Assist  Walk 10 feet activity did not occur: Safety/medical concerns  Assist level: Contact Guard/Touching assist Assistive device: Rollator   Walk 50 feet activity   Assist Walk 50 feet with 2 turns activity did not occur: Safety/medical concerns         Walk 150 feet activity   Assist Walk 150 feet activity did not occur: Safety/medical concerns          Walk 10 feet on uneven surface  activity   Assist Walk 10 feet on uneven surfaces activity did not occur: Safety/medical concerns   Assist level: Contact Guard/Touching assist Assistive device: Rollator   Wheelchair     Assist Will patient use wheelchair at discharge?: Yes Type of Wheelchair: Manual    Wheelchair assist level: Supervision/Verbal cueing Max wheelchair distance: 100'    Wheelchair 50 feet with 2 turns activity    Assist    Wheelchair 50 feet with 2 turns activity did not occur: Safety/medical concerns   Assist Level: Supervision/Verbal cueing   Wheelchair 150 feet activity     Assist  Wheelchair 150 feet activity did not occur: Safety/medical concerns       Blood pressure 126/66, pulse (!) 59, temperature 98.6 F (37 C), temperature source Oral, resp. rate 18, height 5\' 8"  (1.727 m), weight 103 kg, SpO2 100 %.  Medical Problem List and Plan: 1.  Decreased functional mobility secondary to L3-4 4-5 spondylolisthesis arthropathy with radiculopathy/spinal stenosis.  Status post bilateral L3-4 4-5 laminotomy foraminotomies decompression interbody fusion 02/10/2020.  Back corset when out of bed  Continue CIR, 15/7 2.  Antithrombotics: -DVT/anticoagulation: SCDs.    Lovenox             -antiplatelet therapy: Plavix resumed 3. Pain Management: Flexeril as needed, Dilaudid 2-4 mg every 4 hours as needed pain  RIght post hip pain , likely sacroiliac- cont PT, topical analgesic   Relatively controlled with meds on 11/28  11/29- still not able to walk >40 ft- wondering about doing SI joint energy techniques- if doesn't work, will try to get SI joint injections inpt/with Dr Letta Pate.  4. Mood: Provide emotional support             -antipsychotic agents: N/A 5. Neuropsych: This patient is capable of making decisions on his own behalf. 6. Skin/Wound Care: Routine skin checks 7. Fluids/Electrolytes/Nutrition: Routine in and outs 8.  Acute on chronic  anemia.    Hemoglobin 11.3 on 11/22  Continue to monitor 9.  CAD with CABG/ICD.  Amiodarone 200 mg twice daily, Imdur 30 mg daily, Toprol 25 mg daily 10  COPD.  Check oxygen saturations every shift 11.  CKD stage III with history of right nephrectomy. Has single L kidney  Baseline creatinine 1.47-1.50  Creatinine 1.63 on 11/25, BUN improving-labs ordered for tomorrow  11/29- Cr 1.68-Bun 29- will give IVFs 75 cc x 1 day and recheck tomorrow 12.  Diabetes mellitus with hyperglycemia.  Hemoglobin A1c 5.9.  Currently on Tradjenta 5 mg daily.  Patient on Levemir 10 units daily prior to admission.  Resume as needed  Levemir 5 daily started on 11/27  Remains elevated on 11/28, not make further changes today given recent increase. 13.  Chronic systolic congestive heart failure.  Entresto 49-51 mg twice daily, Lasix 40 mg twice daily.  Monitor for any signs of fluid overload. Filed  Weights   02/29/20 0410 03/01/20 0603 03/02/20 0500  Weight: 101 kg 101 kg 103 kg   Stable on 11/28 14.  Hypertension.  Monitor with increased mobility   Vitals:   03/01/20 2024 03/02/20 0505  BP: (!) 108/58 126/66  Pulse: (!) 59 (!) 59  Resp: 17 18  Temp: 98.3 F (36.8 C) 98.6 F (37 C)  SpO2: 100% 100%   On lasix, entresto, toprol   Relatively controlled on 11/28 15.  Hyperlipidemia.  Lipitor 16.  Hypothyroidism.  Synthroid 17.  BPH.  Flomax 0.4 mg daily.    ?  Improving 18.  Sleep disturbance: Continue melatonin 6 mg QHS (PTA medication) 19. Hypokalemia  Potassium 3.3 on 11/25, labs ordered for tomorrow  Magnesium on Monday   Daily supplement initiated  11/29- K+ up to 3.4- will give 40 mEq x1 and Mg is 2.0- so doesn't need repletion 20.  Drug-induced constipation  Bowel meds increased on 11/15, increased again on 11/26.   Improving 21. ? UTI  Patient complaining of back pain and states this is typical symptom with UTI, denies other symptoms.  All symptoms resolved  UA?+, Urine culture pending,  however given resolution of "symptoms" would consider holding treatment regardless  11/29- no Sx's, but Urine Cx is >100k GNR- will d/w pt if should treat 'UTI". Afebrile; WBC is normal    LOS: 18 days A FACE TO FACE EVALUATION WAS PERFORMED  Rily Nickey 03/02/2020, 9:42 AM

## 2020-03-02 NOTE — Progress Notes (Signed)
Per pt request he would like everything completed before 9pm. Blood sugar taken at 9pm, It was 159.

## 2020-03-02 NOTE — Discharge Summary (Signed)
Physician Discharge Summary  Patient ID: James [name]. MRN: [id number] DOB/AGE: 69/11/1950 69 y.o.  Admit date: 02/13/2020 Discharge date: 03/04/2020  Discharge Diagnoses:  Principal Problem:   Lumbar radiculopathy Active Problems:   Drug induced constipation   Hypokalemia   Labile blood pressure   Chronic diastolic congestive heart failure (HCC)   Controlled type 2 diabetes mellitus with hyperglycemia, without long-term current use of insulin (HCC)   Solitary kidney, acquired   Left hip pain   Postoperative pain   Stage 3b chronic kidney disease (HCC)   Abnormal urinalysis Acute on chronic anemia CAD with CABG COPD Hyperlipidemia BPH  Discharged Condition: Stable  Significant Diagnostic Studies: DG Lumbar Spine 2-3 Views  Result Date: 02/10/2020 CLINICAL DATA:  Posterior fusion EXAM: LUMBAR SPINE - 2-3 VIEW; DG C-ARM 1-60 MIN COMPARISON:  January 07, 2020 FLUOROSCOPY TIME:  0 minutes 22 seconds; 19.85 mGy; 2 acquired images FINDINGS: Frontal and lateral views were obtained. There are pedicle screws at L3, L4, and L5 bilaterally with screw tips in respective vertebral bodies. Disc spacers noted at L3-4 and L4-5. No fracture evident. Mild spondylolisthesis at L4-5 appear stable. No new spondylolisthesis. IMPRESSION: Postoperative pedicle screw placement at L3, L4, and L5 as well as disc spacers at L3-4 and L4-5. Pedicle screws in respective vertebral bodies. Stable mild spondylolisthesis at L4-5 with screw placement in this area. No fracture evident. Electronically Signed   By: Lowella Grip III M.D.   On: 02/10/2020 12:33   DG Lumbar Spine 1 View  Result Date: 02/10/2020 CLINICAL DATA:  Intraoperative localization image EXAM: LUMBAR SPINE - 1 VIEW COMPARISON:  01/07/2020, 12/13/2019 FINDINGS: Lateral radiograph of the lumbar spine was obtained with surgical retractors and instruments at the L3-4 interspace. Numbering nomenclature is similar to that utilized on prior MRI  examination. IMPRESSION: Intraoperative localization at L3-4. Electronically Signed   By: Inez Catalina M.D.   On: 02/10/2020 14:10   DG C-Arm 1-60 Min  Result Date: 02/10/2020 CLINICAL DATA:  Posterior fusion EXAM: LUMBAR SPINE - 2-3 VIEW; DG C-ARM 1-60 MIN COMPARISON:  January 07, 2020 FLUOROSCOPY TIME:  0 minutes 22 seconds; 19.85 mGy; 2 acquired images FINDINGS: Frontal and lateral views were obtained. There are pedicle screws at L3, L4, and L5 bilaterally with screw tips in respective vertebral bodies. Disc spacers noted at L3-4 and L4-5. No fracture evident. Mild spondylolisthesis at L4-5 appear stable. No new spondylolisthesis. IMPRESSION: Postoperative pedicle screw placement at L3, L4, and L5 as well as disc spacers at L3-4 and L4-5. Pedicle screws in respective vertebral bodies. Stable mild spondylolisthesis at L4-5 with screw placement in this area. No fracture evident. Electronically Signed   By: Lowella Grip III M.D.   On: 02/10/2020 12:33   VAS [place] LOWER EXTREMITY VENOUS (DVT)  Result Date: 02/15/2020  Lower Venous DVT Study Indications: Swelling.  Risk Factors: Surgery 02-10-2020 Posterior lumbar fusion. Limitations: Patient seated in chair during exam, unable to relocate to bed. Comparison Study: No prior studies. Performing Technologist: Darlin Coco, RDMS  Examination Guidelines: A complete evaluation includes B-mode imaging, spectral Doppler, color Doppler, and power Doppler as needed of all accessible portions of each vessel. Bilateral testing is considered an integral part of a complete examination. Limited examinations for reoccurring indications may be performed as noted. The reflux portion of the exam is performed with the patient in reverse Trendelenburg.  +---------+---------------+---------+-----------+----------+--------------+ RIGHT    CompressibilityPhasicitySpontaneityPropertiesThrombus Aging +---------+---------------+---------+-----------+----------+--------------+  CFV      Full  Yes      Yes                                 +---------+---------------+---------+-----------+----------+--------------+ SFJ      Full                                                        +---------+---------------+---------+-----------+----------+--------------+ FV Prox  Full                                                        +---------+---------------+---------+-----------+----------+--------------+ FV Mid   Full                                                        +---------+---------------+---------+-----------+----------+--------------+ FV DistalFull                                                        +---------+---------------+---------+-----------+----------+--------------+ PFV      Full                                                        +---------+---------------+---------+-----------+----------+--------------+ POP      Full           Yes      Yes                                 +---------+---------------+---------+-----------+----------+--------------+ PTV      Full                                                        +---------+---------------+---------+-----------+----------+--------------+ PERO     Full                                                        +---------+---------------+---------+-----------+----------+--------------+   +---------+---------------+---------+-----------+----------+-------------------+ LEFT     CompressibilityPhasicitySpontaneityPropertiesThrombus Aging      +---------+---------------+---------+-----------+----------+-------------------+ CFV      Full           Yes      Yes                  Not well visualized  due to patient                                                            position            +---------+---------------+---------+-----------+----------+-------------------+ SFJ       Full                                                             +---------+---------------+---------+-----------+----------+-------------------+ FV Prox  Full                                                             +---------+---------------+---------+-----------+----------+-------------------+ FV Mid   Full                                                             +---------+---------------+---------+-----------+----------+-------------------+ FV DistalFull                                                             +---------+---------------+---------+-----------+----------+-------------------+ PFV      Full                                                             +---------+---------------+---------+-----------+----------+-------------------+ POP      Full           Yes      Yes                                      +---------+---------------+---------+-----------+----------+-------------------+ PTV      Full                                                             +---------+---------------+---------+-----------+----------+-------------------+ PERO     Full                                                             +---------+---------------+---------+-----------+----------+-------------------+  Summary: RIGHT: - There is no evidence of deep vein thrombosis in the lower extremity.  - No cystic structure found in the popliteal fossa.  LEFT: - There is no evidence of deep vein thrombosis in the lower extremity.  - No cystic structure found in the popliteal fossa.  *See table(s) above for measurements and observations. Electronically signed by Ruta Hinds MD on 02/15/2020 at 11:47:11 AM.    Final     Labs:  Basic Metabolic Panel: Recent Labs  Lab 02/27/20 0423 03/02/20 0644 03/03/20 0748  NA 142 138 138  K 3.3* 3.4* 3.6  CL 104 102 103  CO2 26 26 24   GLUCOSE 166* 130* 141*  BUN 27* 29* 26*  CREATININE 1.63* 1.68* 1.54*   CALCIUM 9.0 9.0 8.9  MG  --  2.0  --     CBC: Recent Labs  Lab 03/02/20 0644  WBC 7.4  NEUTROABS 4.6  HGB 11.0*  HCT 34.4*  MCV 89.8  PLT 271    CBG: Recent Labs  Lab 03/02/20 2053 03/03/20 0600 03/03/20 1119 03/03/20 1637 03/03/20 2049  GLUCAP 159* 107* 109* 133* 101*   Family history.  Mother with CAD and breast cancer Father with CAD brother with COPD.  Denies any diabetes mellitus colon cancer or esophageal cancer  Brief HPI:   James [name]. is a 69 y.o. right-handed male with history of CKD with creatinine 1.47 with right nephrectomy 2019, CAD with CABG ICD 2015 maintained on amiodarone as well as Plavix followed by cardiology services Dr. Bettina Gavia in [facility], chronic diastolic congestive heart failure maintained on Entresto, diabetes mellitus, hypertension and hyperlipidemia.  Patient lives with spouse 1 level home.  Used a Rollator prior to admission.  Wife does assist with some ADLs.  Presented February 10, 2020 with progressive low back pain radiating lower extremities right greater than left.  MRI demonstrated L3-4 and 4-5 spinal listhesis facet arthropathy spinal stenosis with radiculopathy.  Patient underwent bilateral L3-4 4-5 laminotomy foraminotomies to decompress bilateral L3-4-5 nerve roots with posterior lumbar interbody fusion as well as transforaminal lumbar interbody fusion February 10, 2020 per Dr. Arnoldo Morale.  Lumbar corset when out of bed.  Monitoring of renal function latest creatinine 1.62.  Acute blood loss anemia 9.8.  Plavix remains on hold for 5 days and resumed.  Therapy evaluations completed and patient was admitted for a comprehensive rehab program   Hospital Course: James [name]. was admitted to rehab 02/13/2020 for inpatient therapies to consist of PT, ST and OT at least three hours five days a week. Past admission physiatrist, therapy team and rehab RN have worked together to provide customized collaborative inpatient rehab.   Pertaining to patient's lumbar radiculopathy status post L3-4-5 laminotomy foraminotomies decompression February 10, 2020.  Back incision healing nicely back corset out of bed.  Patient would follow-up with neurosurgery.  Pain management with the use of Dilaudid 2 mg nightly with Lidoderm patch and oxycodone for breakthrough pain.  Subcutaneous Lovenox for DVT prophylaxis.  Acute on chronic anemia stable 11.3.  Patient with history of CAD CABG ICD amiodarone M. Doerr Toprol as advised patient would follow-up outpatient cardiology services.  CKD stage III with history of right nephrectomy Baseline creatinine 1.47-1.50 and latest creatinine 1.63.  Patient exhibited no signs of fluid overload he continued on Entresto as well as Lasix.  Lipitor for hyperlipidemia.  Hormone supplement for hypothyroidism.  BPH no dysuria hematuria he continued on Flomax.  Blood sugars controlled hemoglobin A1c  5.9 Levemir insulin as directed as well as Tradjenta 5 mg daily with diabetic teaching.  Drug-induced constipation resolved with laxative assistance.   Blood pressures were monitored on TID basis and controlled  Diabetes has been monitored with ac/hs CBG checks and SSI was use prn for tighter BS control.    Rehab course: During patient's stay in rehab weekly team conferences were held to monitor patient's progress, set goals and discuss barriers to discharge. At admission, patient required minimal assist ambulate 2 feet rolling walker max assist sit to supine.  Minimal assist upper body bathing minimal assist upper body dressing total assist lower body dressing  Physical exam.  Blood pressure 140/64 pulse 59 temperature 99 respirations 18 oxygen saturation 94% room air Constitutional.  No acute distress HEENT Head.  Normocephalic and atraumatic Eyes.  Pupils round and reactive to light no discharge without nystagmus Neck.  Supple nontender no JVD without thyromegaly Cardiac regular rate rhythm without any extra sounds  or murmur heard Abdomen.  Soft nontender positive bowel sounds without rebound Respiratory effort normal no respiratory distress without wheeze Musculoskeletal.  Upper extremities 4/5 deltoids biceps triceps and 4+/5 wrist extension grip and finger abduction left greater than right Lower extremities right lower extremity hip flexion 4 -/5 knee extension 4/5 dorsiflexion 2/5 plantarflexion 4/5 Left lower extremity hip flexion 4/5 knee extension 4+/5 dorsiflexion 2/5 plantarflexion 4/5 Skin.  Back incision dressed Neurological oriented x3 follows commands fair medical historian  He/  has had improvement in activity tolerance, balance, postural control as well as ability to compensate for deficits. He/ has had improvement in functional use RUE/LUE  and RLE/LLE as well as improvement in awareness.  Manual wheelchair propulsion bilateral upper extremity supervision.  Ambulates 40 feet x 2 Rollator contact-guard assist.  Requires some increased time to perform tasks.  Forward backwards ambulation Rollator contact-guard assist for balance.  Oral care completed seated at sink ambulates with Rollator to and from commode wheelchair and shower bench with contact-guard assist.  Toileting completed with moderate assist for clothing management max assist for hygiene after bowel movement.  Shower completed with hand-held shower long handled sponge minimal assist.  Full family teaching completed plan discharge to home       Disposition: Discharge to home    Diet: Diabetic diet  Special Instructions: No driving smoking or alcohol  Back corset when out of bed  Medications at discharge 1.  Tylenol as needed 2.  Amiodarone 200 mg twice daily 3.  Lipitor 40 mg daily 4.  Plavix 75 mg daily 5.  Flexeril 10 mg 3 times daily as needed muscle spasms 6.  Colace 100 mg twice daily 7.  Lasix 40 mg twice daily 8.  Dilaudid 2 mg nightly x1 week and stop 9.  Levemir 5 units daily 10.  Imdur 30 mg every  morning 11.  Synthroid 88 mcg p.o. daily 12.  Lidoderm patch as directed 13.  Tradjenta 5 mg daily 14.  Melatonin 6 mg nightly 15.  Toprol-XL 25 mg daily 16.  Nitroglycerin as needed 17.  Oxycodone 5 to 10 mg every 4 hours as needed pain 18.  MiraLAX twice daily hold for loose stools 19.  Klor-Con 10 mEq p.o. daily 20.  Entresto 49-51 mg twice daily 21.  Flomax 0.4 mg daily 22.  Vitamin D 50,000 units every Monday  30-35 minutes were spent completing discharge summary and discharge planning  Discharge Instructions    Ambulatory referral to Physical Medicine Rehab   Complete by: As  directed    Moderate complexity follow-up 1 to 2 weeks lumbar radiculopathy       Follow-up Information    Lovorn, Jinny Blossom, MD Follow up.   Specialty: Physical Medicine and Rehabilitation Why: Office to call for appointment Contact information: [address] [place] [address] [phone]        Richardo Priest, MD Follow up.   Specialty: Cardiology Why: Call for appointment Contact information: [address] [place] [address] [phone]        Newman Pies, MD Follow up.   Specialty: Neurosurgery Why: Call for appointment Contact information: [address] [place] [address] [phone]               Signed: Lavon Paganini [place] 03/04/2020, 5:04 AM

## 2020-03-03 ENCOUNTER — Inpatient Hospital Stay (HOSPITAL_COMMUNITY): Payer: Medicare Other | Admitting: Physical Therapy

## 2020-03-03 ENCOUNTER — Inpatient Hospital Stay (HOSPITAL_COMMUNITY): Payer: Medicare Other | Admitting: Occupational Therapy

## 2020-03-03 LAB — BASIC METABOLIC PANEL
Anion gap: 11 (ref 5–15)
BUN: 26 mg/dL — ABNORMAL HIGH (ref 8–23)
CO2: 24 mmol/L (ref 22–32)
Calcium: 8.9 mg/dL (ref 8.9–10.3)
Chloride: 103 mmol/L (ref 98–111)
Creatinine, Ser: 1.54 mg/dL — ABNORMAL HIGH (ref 0.61–1.24)
GFR, Estimated: 49 mL/min — ABNORMAL LOW (ref >60)
Glucose, Bld: 141 mg/dL — ABNORMAL HIGH (ref 70–99)
Potassium: 3.6 mmol/L (ref 3.5–5.1)
Sodium: 138 mmol/L (ref 135–145)

## 2020-03-03 LAB — GLUCOSE, CAPILLARY
Glucose-Capillary: 107 mg/dL — ABNORMAL HIGH (ref 70–99)
Glucose-Capillary: 109 mg/dL — ABNORMAL HIGH (ref 70–99)
Glucose-Capillary: 133 mg/dL — ABNORMAL HIGH (ref 70–99)
Glucose-Capillary: 101 mg/dL — ABNORMAL HIGH (ref 70–99)

## 2020-03-03 LAB — URINE CULTURE: Culture: >=100000 — AB

## 2020-03-03 MED ORDER — CLOPIDOGREL BISULFATE 75 MG PO TABS: 75.0000 mg | ORAL_TABLET | Freq: Every day | ORAL | 1 refills | Status: DC

## 2020-03-03 MED ORDER — INFLUENZA VAC A&B SA ADJ QUAD 0.5 ML IM PRSY: 0.5000 mL | PREFILLED_SYRINGE | INTRAMUSCULAR | Status: DC

## 2020-03-03 MED ORDER — B COMPLEX VITAMINS PO CAPS: 1.0000 | ORAL_CAPSULE | Freq: Every day | ORAL | 0 refills | Status: AC

## 2020-03-03 MED ORDER — LIDOCAINE 5 % EX PTCH: 1.0000 | MEDICATED_PATCH | CUTANEOUS | 0 refills | Status: AC

## 2020-03-03 MED ORDER — ISOSORBIDE MONONITRATE ER 30 MG PO TB24: 30.0000 mg | ORAL_TABLET | Freq: Every morning | ORAL | 0 refills | Status: AC

## 2020-03-03 MED ORDER — VITAMIN C 1000 MG PO TABS: 1000.0000 mg | ORAL_TABLET | Freq: Two times a day (BID) | ORAL | 0 refills | Status: AC

## 2020-03-03 MED ORDER — AMIODARONE HCL 200 MG PO TABS: 200.0000 mg | ORAL_TABLET | Freq: Two times a day (BID) | ORAL | 0 refills | Status: DC

## 2020-03-03 MED ORDER — MELATONIN 3 MG PO TABS: 6.0000 mg | ORAL_TABLET | Freq: Every day | ORAL | 0 refills | Status: AC

## 2020-03-03 MED ORDER — POLYETHYLENE GLYCOL 3350 17 G PO PACK: 17.0000 g | PACK | Freq: Two times a day (BID) | ORAL | 0 refills | Status: AC

## 2020-03-03 MED ORDER — CYCLOBENZAPRINE HCL 10 MG PO TABS: 10.0000 mg | ORAL_TABLET | Freq: Three times a day (TID) | ORAL | 0 refills | Status: AC | PRN

## 2020-03-03 MED ORDER — POTASSIUM CHLORIDE CRYS ER 10 MEQ PO TBCR: 10.0000 meq | EXTENDED_RELEASE_TABLET | Freq: Every day | ORAL | 0 refills | Status: AC

## 2020-03-03 MED ORDER — METOPROLOL SUCCINATE ER 25 MG PO TB24: 25.0000 mg | ORAL_TABLET | Freq: Every day | ORAL | 3 refills | Status: AC

## 2020-03-03 MED ORDER — TAMSULOSIN HCL 0.4 MG PO CAPS: 0.4000 mg | ORAL_CAPSULE | Freq: Every day | ORAL | 0 refills | Status: AC

## 2020-03-03 MED ORDER — INSULIN DETEMIR 100 UNIT/ML FLEXPEN: 5.0000 [IU] | PEN_INJECTOR | Freq: Every day | SUBCUTANEOUS | 11 refills | Status: AC

## 2020-03-03 MED ORDER — DOCUSATE SODIUM 100 MG PO CAPS: 100.0000 mg | ORAL_CAPSULE | Freq: Two times a day (BID) | ORAL | 0 refills | Status: AC

## 2020-03-03 MED ORDER — OXYCODONE HCL 5 MG PO TABS
5.0000 mg | ORAL_TABLET | ORAL | 0 refills | Status: AC | PRN
Start: 2020-03-03 — End: ?

## 2020-03-03 MED ORDER — FUROSEMIDE 40 MG PO TABS: ORAL_TABLET | ORAL | 1 refills | Status: DC

## 2020-03-03 MED ORDER — LEVOTHYROXINE SODIUM 88 MCG PO TABS: 88.0000 ug | ORAL_TABLET | Freq: Every day | ORAL | 0 refills | Status: AC

## 2020-03-03 MED ORDER — ATORVASTATIN CALCIUM 40 MG PO TABS: ORAL_TABLET | ORAL | 2 refills | Status: DC

## 2020-03-03 MED ORDER — ERGOCALCIFEROL 1.25 MG (50000 UT) PO CAPS: 50000.0000 [IU] | ORAL_CAPSULE | ORAL | 0 refills | Status: AC

## 2020-03-03 NOTE — Progress Notes (Signed)
Inpatient Rehabilitation Care Coordinator  Discharge Note  The overall goal for the admission was met for:   Discharge location: Yes-HOME WITH WIFE WHO IS ABLE TO PROVIDE ASSIST  Length of Stay: Yes-20 DAYS  Discharge activity level: Yes-SUPERVISION-CGA LEVEL  Home/community participation: Yes  Services provided included: MD, RD, PT, OT, RN, CM, Pharmacy, Neuropsych and SW  Financial Services: Medicare and Private Insurance: [place]  Follow-up services arranged: Home Health: [facility]-PT,OT,RN, DME: [facility] and Patient/Family request agency HH: [facility], DME: NO PREF  Comments (or additional information):WIFE WAS IN FOR EDUCATION AND [place].  Patient/Family verbalized understanding of follow-up arrangements: Yes  Individual responsible for coordination of the follow-up plan: NANCY-WIFE [phone]  Confirmed correct DME delivered: Elease Hashimoto 03/03/2020    Elease Hashimoto

## 2020-03-03 NOTE — Progress Notes (Signed)
Physical Therapy Session Note  Patient Details  Name: James [name]. MRN: [id number] Date of Birth: 01-01-1951  Today's Date: 03/03/2020 PT Individual Time: 1400-1440 PT Individual Time Calculation (min): 40 min   Short Term Goals: Week 1:  PT Short Term Goal 1 (Week 1): Patient will consistently perform sit to stand transfers with S PT Short Term Goal 1 - Progress (Week 1): Met PT Short Term Goal 2 (Week 1): Patient will demonstrate standing tolerance for 3 minutes during functional task. PT Short Term Goal 2 - Progress (Week 1): Progressing toward goal PT Short Term Goal 3 (Week 1): Patient will ambulate 40' with min A LRAD PT Short Term Goal 3 - Progress (Week 1): Met PT Short Term Goal 4 (Week 1): Patient will perform bed mobility with min A at least 50% of the time PT Short Term Goal 4 - Progress (Week 1): Not met Week 2:  PT Short Term Goal 1 (Week 2): Patient will ambulate 25' with rollator and S. PT Short Term Goal 1 - Progress (Week 2): Met PT Short Term Goal 2 (Week 2): Patient will negotiate 1 step with rollator and min A for home entry. PT Short Term Goal 2 - Progress (Week 2): Met PT Short Term Goal 3 (Week 2): Patient will demonstrate bed mobility with min A 50% of the time. PT Short Term Goal 3 - Progress (Week 2): Met PT Short Term Goal 4 (Week 2): Patient will tolerate 3 minutes standing for functional task with S with intermittent UE support. PT Short Term Goal 4 - Progress (Week 2): Met  Skilled Therapeutic Interventions/Progress Updates:    pt received in [facility] and agreeable to therapy. Pt denied pain at start and end of session. Pt directed in gait training with rollator for 30' +10' +15' all at [facility] for safety, pt reported fatigue and "I feel like I have no energy." post gait but continued to deny other symptoms. Pt directed in x5 Sit to stand to rollator at [facility] with VC for break use on rollator for safety, 4/5 reps and hand placement once for safety. Pt denied seated  strengthening exercises 2/2 fatigue but agreeable to WC mobility to room, BUE for propulsion, 100' supervision. Pt left in WC, alarm belt set, All needs in reach and in good condition. Call light in hand.    Therapy Documentation Precautions:  Precautions Precautions: Back Required Braces or Orthoses: Spinal Brace Spinal Brace: Lumbar corset, Applied in sitting position Restrictions Weight Bearing Restrictions: No General: PT Amount of Missed Time (min): 15 Minutes PT Missed Treatment Reason: Other (Comment) (pt recovery from recent fall) Vital Signs: Therapy Vitals Temp: 98 F (36.7 C) Pulse Rate: 63 Resp: 14 BP: (!) 98/55 Patient Position (if appropriate): Sitting Oxygen Therapy SpO2: 100 % O2 Device: Room Air Pain:   Mobility:   Locomotion :    Trunk/Postural Assessment :    Balance:   Exercises:   Other Treatments:      Therapy/Group: Individual Therapy  [name] 03/03/2020, 2:40 PM

## 2020-03-03 NOTE — Progress Notes (Signed)
Physical Therapy Session Note  Patient Details  Name: James [name]. MRN: [id number] Date of Birth: 29-Nov-1950  Today's Date: 03/03/2020 PT Individual Time: 1000-1015; 1030-1100 PT Individual Time Calculation (min): 15 min and 30 min  Short Term Goals: Week 3:  PT Short Term Goal 1 (Week 3): STGs=LTGs  Skilled Therapeutic Interventions/Progress Updates:    Session 1: Pt received seated on floor following fall after transfer attempt with MD. Nursing and MD in room to assess patient post-fall. Assisted pt with trunk control in long-sitting position for placement of maxi sky sling. Maxi sky transfer back to bed. Able to remove maxi sky sling with patient in long-sitting position. Pt able to be positioned in semi-reclined position in bed for MD to perform SI joint treatment. Pt requesting time to recover after fall. Pt left semi-reclined in bed with needs in reach, bed alarm in place. Pt missed 15 min of scheduled therapy session due to time needed to recover.  Session 2: This therapist returned after 15 min and pt agreeable to participate in therapy session as able. Semi-reclined in bed to sitting EOB at Supervision level. Sit to stand with CGA to rollator. Pt exhibits some anxiety regarding standing and transfers due to fall earlier. Provided emotional support to patient. Stand pivot transfer to w/c with rollator and CGA, cues for safety. Sit to stand x 5 reps from w/c to rollator with CGA, heavy pushing with BLE against back of the chair. Pt left seated in w/c in room with needs in reach, quick release belt and chair alarm in place at end of session.  Therapy Documentation Precautions:  Precautions Precautions: Back Required Braces or Orthoses: Spinal Brace Spinal Brace: Lumbar corset, Applied in sitting position Restrictions Weight Bearing Restrictions: No General: PT Amount of Missed Time (min): 15 Minutes PT Missed Treatment Reason: Other (Comment) (pt recovery from recent  fall)   Therapy/Group: Individual Therapy   Excell Seltzer, PT, DPT  03/03/2020, 11:51 AM

## 2020-03-03 NOTE — Patient Care Conference (Signed)
Inpatient RehabilitationTeam Conference and Plan of Care Update Date: 03/03/2020   Time: 11:54 AM    Patient Name: James [name]      Medical Record Number: [id number]  Date of Birth: 1951-01-19 Sex: Male         Room/Bed: 4M08C/4M08C-01 Payor Info: Payor: MEDICARE / Plan: MEDICARE PART A AND B / Product Type: *No Product type* /    Admit Date/Time:  02/13/2020  3:44 PM  Primary Diagnosis:  Lumbar radiculopathy  Hospital Problems: Principal Problem:   Lumbar radiculopathy Active Problems:   Drug induced constipation   Hypokalemia   Labile blood pressure   Chronic diastolic congestive heart failure (HCC)   Controlled type 2 diabetes mellitus with hyperglycemia, without long-term current use of insulin (HCC)   Solitary kidney, acquired   Left hip pain   Postoperative pain   Stage 3b chronic kidney disease ([place])   Abnormal urinalysis    Expected Discharge Date: Expected Discharge Date: 03/04/20  Team Members Present: Physician leading conference: Dr. [name] Care Coodinator Present: Dorthula Nettles, RN, BSN, CRRN;Becky Dupree, LCSW Nurse Present: Rayne Du, LPN PT Present: Stacy Gardner, PT OT Present: Lillia Corporal, OT PPS Coordinator present : Ileana Ladd, Burna Mortimer, SLP     Current Status/Progress Goal Weekly Team Focus  Bowel/Bladder   cont of b and b lbm- 11/29  remain cont of b and b   assess q shift and prn    Swallow/Nutrition/ Hydration             ADL's   Min/Mod LB dress, Min LB bathe at shower level, CGA transfers continues to have posterior lean and shuffling gait, slow movements, looses balance posterior, R hip pain continued but tolerable  Min LB dress, CGA ADL transfers, otherwise Supervision  AE for back precaution, safety during ADL transfers, endurance   Mobility   S bed mobility, S to CGA transfers, gait up to 40 ft with rollator CGA, min to mod A one step with rollator  mainly supervision overall except min A car transfer and  stairs  family edu, d/c planning   Communication             Safety/Cognition/ Behavioral Observations            Pain   pt complains of pain in hip 8/10 oxy prn   decrease pain and hae pain controlled   assess prn and medicate as needed    Skin   lower back surgical incision ota   no skin breakdown or infection   assess q shift and prn      Discharge Planning:  Wife was in yesterday for education and both feel comfortable with discharge tomorrow. Pt to have home health and has needed equipment   Team Discussion: Continent B/B with occasional incontinent bladder episodes. OT reports patient is a min-mod assist lower body ADL's using adaptive equipment, has a posterior lean when sitting down. Wife is doing education. PT reports patient is somewhat unsafe due to the posterior lean but wife is aware and feels she can handle it once he is home. Patient is ready for discharge. Patient on target to meet rehab goals: yes  *See Care Plan and progress notes for long and short-term goals.   Revisions to Treatment Plan:  MD discontinued Dilaudid due to increased weird dreams and calling his wife at 2 AM and 3 AM. Patient received IV fluids yesterday.  Teaching Needs: Wife is currently completing family education.  Current Barriers  to Discharge: Decreased caregiver support, Home enviroment access/layout, Incontinence, Weight and Weight bearing restrictions  Possible Resolutions to Barriers: Continue current medications, timed toileting schedule, educate weight bearing precautions, provide emotional support to patient and family.     Medical Summary Current Status: controlled fall this AM; no increased pain from it; 50/50 continent bladder; continent bowel; no skin issues;  Barriers to Discharge: Decreased family/caregiver support;Home enviroment access/layout;Incontinence;Weight  Barriers to Discharge Comments: fall today, but has had family training- insistent on d/c tomorrow-  12/1 Possible Resolutions to [employer] Focus: ADLs- min-mod A LB; CGA to transfers- posterior lean with backwards step; shuffling gait;- d/c 12/1- wife comfortable with occ posterior lean;   Continued Need for Acute Rehabilitation Level of Care: The patient requires daily medical management by a physician with specialized training in physical medicine and rehabilitation for the following reasons: Direction of a multidisciplinary physical rehabilitation program to maximize functional independence : Yes Medical management of patient stability for increased activity during participation in an intensive rehabilitation regime.: Yes Analysis of laboratory values and/or radiology reports with any subsequent need for medication adjustment and/or medical intervention. : Yes   I attest that I was present, lead the team conference, and concur with the assessment and plan of the team.   Cristi Loron 03/03/2020, 3:33 PM

## 2020-03-03 NOTE — Discharge Instructions (Signed)
Inpatient Rehab Discharge Instructions  Aeric Burnham. Discharge date and time: No discharge date for patient encounter.   Activities/Precautions/ Functional Status: Activity: Back corset as directed Diet: diabetic diet Wound Care: Routine skin checks Functional status:  ___ No restrictions     ___ Walk up steps independently ___ 24/7 supervision/assistance   ___ Walk up steps with assistance ___ Intermittent supervision/assistance  ___ Bathe/dress independently ___ Walk with walker     _x__ Bathe/dress with assistance ___ Walk Independently    ___ Shower independently ___ Walk with assistance    ___ Shower with assistance ___ No alcohol     ___ Return to work/school ________  Special Instructions: No driving smoking or alcohol    COMMUNITY REFERRALS UPON DISCHARGE:    Home Health:   PT, OT, RN                   [place] [facility]:[phone]    Medical Equipment/Items Ordered:TUB [employer]ENCH                                                 Agency/Supplier:[facility]   [phone]   My questions have been answered and I understand these instructions. I will adhere to these goals and the provided educational materials after my discharge from the hospital.  Patient/Caregiver Signature _______________________________ Date __________  Clinician Signature _______________________________________ Date __________  Please bring this form and your medication list with you to all your follow-up doctor's appointments.

## 2020-03-03 NOTE — Progress Notes (Signed)
Nurse called into room by attending, patient found sitting on the floor reporting no pain. Vital signs taken, all WNL. Dr. Dagoberto Ligas and Algis Liming present. No new orders at this time. Patient reported hitting bottom and nothing else with fall. Will continue plan of care.

## 2020-03-03 NOTE — Progress Notes (Signed)
Physical Therapy Discharge Summary  Patient Details  Name: James [name]. MRN: [id number] Date of Birth: March 14, 1951  Today's Date: 03/03/2020  Patient has met 5 of 10 long term goals due to improved activity tolerance, improved balance, improved postural control, increased strength and ability to compensate for deficits.  Patient to discharge at an ambulatory level [place].   Patient's care partner is independent to provide the necessary physical assistance at discharge. Pt's wife has completed hands-on family education and is safe to assist pt upon d/c home.  Reasons goals not met: Pt did not meet all goals due to plateau in progress during rehab stay. Pt's wife reports being comfortable with pt's current assist level and pt adamant about d/c home at current level.  Recommendation:  Patient will benefit from ongoing skilled PT services in home health setting to continue to advance safe functional mobility, address ongoing impairments in endurance, strength, balance, safety, independence with functional mobility, and minimize fall risk.  Equipment: No equipment provided. Pt already owns rollator and w/c.  Reasons for discharge: treatment goals met and discharge from hospital  Patient/family agrees with progress made and goals achieved: Yes  PT Discharge Precautions/Restrictions Precautions Precautions: Back;Fall Precaution Comments: reviewed precautions with pt and wife Required Braces or Orthoses: Spinal Brace Spinal Brace: Lumbar corset;Applied in sitting position Restrictions Weight Bearing Restrictions: No Vision/Perception  Perception Perception: Within Functional Limits Praxis Praxis: Intact  Cognition Overall Cognitive Status: Within Functional Limits for tasks assessed Arousal/Alertness: Awake/alert Orientation Level: Oriented X4 Attention: Sustained;Selective Sustained Attention: Appears intact Memory: Appears intact Awareness: Appears intact Problem Solving:  Appears intact Safety/Judgment: Appears intact Sensation Sensation Light Touch: Impaired by gross assessment (baseline neuropathy in hands and feet) Proprioception: Impaired by gross assessment (due to neuropathy) Coordination Gross Motor Movements are Fluid and Coordinated: No Fine Motor Movements are Fluid and Coordinated: No Coordination and Movement Description: slowed movements and somewhat rigid Motor  Motor Motor: Abnormal postural alignment and control Motor - Skilled Clinical Observations: rounded shoulders, forward head, and stiffness with movement  Mobility Bed Mobility Bed Mobility: Rolling Right;Rolling Left;Supine to Sit;Sit to Supine Rolling Right: Supervision/verbal cueing Rolling Left: Supervision/Verbal cueing Right Sidelying to Sit: Supervision/Verbal cueing Supine to Sit: Supervision/Verbal cueing Sit to Supine: Supervision/Verbal cueing Transfers Transfers: Sit to Stand;Stand to Sit;Stand Pivot Transfers Sit to Stand: Contact Guard/Touching assist Stand to Sit: Contact Guard/Touching assist Stand Pivot Transfers: Contact Guard/Touching assist Stand Pivot Transfer Details: Verbal cues for safe use of DME/AE;Verbal cues for technique;Verbal cues for precautions/safety;Verbal cues for sequencing Transfer (Assistive device): 4-wheeled walker Locomotion  Gait Ambulation: Yes Gait Assistance: Contact Guard/Touching assist Gait Distance (Feet): 40 Feet Assistive device: 4-wheeled walker Gait Assistance Details: Verbal cues for technique;Verbal cues for safe use of DME/AE Gait Gait: Yes Gait Pattern: Impaired Gait Pattern: Decreased step length - right;Decreased step length - left;Decreased hip/knee flexion - right;Decreased hip/knee flexion - left;Decreased trunk rotation Gait velocity: decreased Stairs / Additional Locomotion Stairs: Yes Stairs Assistance: Moderate Assistance - Patient 50 - 74% Stair Management Technique: Other (comment) (with  rollator) Number of Stairs: 1 Height of Stairs: 6 Curb: Moderate Assistance - Patient 50 - 74% Wheelchair Mobility Wheelchair Mobility: Yes Wheelchair Assistance: Chartered loss adjuster: Both upper extremities Wheelchair Parts Management: Needs assistance Distance: 100  Trunk/Postural Assessment  Cervical Assessment Cervical Assessment: Exceptions to [facility] (forward head; stiffness) Thoracic Assessment Thoracic Assessment: Exceptions to [facility] (kyphosis and stiffness) Lumbar Assessment Lumbar Assessment: Exceptions to [facility] (stiffness and posterior pelvic tilt) Postural Control Postural  Control: Deficits on evaluation Postural Limitations: rigidity noted and rounded shoulders  Balance Balance Balance Assessed: Yes Static Sitting Balance Static Sitting - Level of Assistance: 6: Modified independent (Device/Increase time) Dynamic Sitting Balance Dynamic Sitting - Balance Support: Feet supported Dynamic Sitting - Level of Assistance: 5: Stand by assistance Static Standing Balance Static Standing - Balance Support: During functional activity;Bilateral upper extremity supported Static Standing - Level of Assistance: 5: Stand by assistance Dynamic Standing Balance Dynamic Standing - Balance Support: During functional activity;Left upper extremity supported Dynamic Standing - Level of Assistance: 5: Stand by assistance;4: Min assist Extremity Assessment  RUE Assessment RUE Assessment: Within Functional Limits General Strength Comments: weakness present and some stiffness but functional during ADL LUE Assessment LUE Assessment: Exceptions to [facility] Active Range of Motion (AROM) Comments: arthritic shoulder limiting, improved mobility distally General Strength Comments: generalized weakness present, limited at shoulder, fatigues quickly RLE Assessment RLE Assessment: Within Functional Limits General Strength Comments: grossly 4/5 LLE Assessment LLE Assessment:  Within Functional Limits General Strength Comments: grossly 4+/5     Excell Seltzer, PT, DPT 03/03/2020, 6:04 PM

## 2020-03-03 NOTE — Progress Notes (Signed)
°   03/03/20 1000  What Happened  Was fall witnessed? Yes  Who witnessed fall? Dr. Dagoberto Ligas  Patients activity before fall ambulating-assisted  Point of contact buttocks  Was patient injured? No  Follow Up  MD notified Megan Lovorn  Time MD notified 1000  Family notified Yes - comment (pt calling family)  Additional tests No  Simple treatment  (Rest,back in bed)  Progress note created (see row info) Yes  Adult Fall Risk Assessment  Risk Factor Category (scoring not indicated) Fall has occurred during this admission (document High fall risk);High fall risk per protocol (document High fall risk)  Age 69  Fall History: Fall within 6 months prior to admission 5  Elimination; Bowel and/or Urine Incontinence 2  Elimination; Bowel and/or Urine Urgency/Frequency 2  Medications: includes PCA/Opiates, Anti-convulsants, Anti-hypertensives, Diuretics, Hypnotics, Laxatives, Sedatives, and Psychotropics 5  Patient Care Equipment 0  Mobility-Assistance 2  Mobility-Gait 2  Mobility-Sensory Deficit 0  Altered awareness of immediate physical environment 0  Impulsiveness 0  Lack of understanding of one's physical/cognitive limitations 0  Total Score 19  Patient Fall Risk Level High fall risk  Adult Fall Risk Interventions  Required Bundle Interventions *See Row Information* High fall risk - low, moderate, and high requirements implemented  Additional Interventions Use of appropriate toileting equipment (bedpan, BSC, etc.)  Screening for Fall Injury Risk (To be completed on HIGH fall risk patients) - Assessing Need for Low Bed  Risk For Fall Injury- Low Bed Criteria None identified - Continue screening  Screening for Fall Injury Risk (To be completed on HIGH fall risk patients who do not meet crieteria for Low Bed) - Assessing Need for Floor Mats Only  Risk For Fall Injury- Criteria for Floor Mats Bleeding risk-anticoagulation (not prophylaxis)  Will Implement Floor Mats Yes  Vitals  Temp 97.6 F  (36.4 C)  BP (!) 145/72  MAP (mmHg) 94  BP Location Left Arm  BP Method Automatic  Patient Position (if appropriate) Sitting  Pulse Rate 79  Pulse Rate Source Monitor  Resp (!) 22  Oxygen Therapy  SpO2 100 %  O2 Device Room Air  Pain Assessment  Pain Scale 0-10  Pain Score 0  Neurological  Neuro (WDL) X  Level of Consciousness Alert  Orientation Level Oriented X4  Cognition Appropriate at baseline;Follows commands  R Hand Grip Moderate  L Hand Grip Moderate   R Foot Dorsiflexion Moderate  L Foot Dorsiflexion Moderate  R Foot Plantar Flexion Moderate  L Foot Plantar Flexion Moderate  Neuro Symptoms None  Musculoskeletal  Musculoskeletal (WDL) X  Assistive Device Wheelchair  Generalized Weakness Yes  Weight Bearing Restrictions No  Integumentary  Integumentary (WDL) X  Skin Color Appropriate for ethnicity  Skin Condition Dry

## 2020-03-03 NOTE — Progress Notes (Signed)
Nutrition Follow-up  DOCUMENTATION CODES:   Obesity unspecified  INTERVENTION:   -ContinueEnsure Enlive po BID, each supplement provides 350 kcal and 20 grams of protein(please provide chocolate flavor per pt request)  - Encourage adequate PO intake  NUTRITION DIAGNOSIS:   Increased nutrient needs related to post-op healing, chronic illness (COPD, CHF) as evidenced by estimated needs.  Ongoing  GOAL:   Patient will meet greater than or equal to 90% of their needs  Progressing  MONITOR:   PO intake, Supplement acceptance, Labs, Weight trends, Skin, I & O's  REASON FOR ASSESSMENT:   Malnutrition Screening Tool    ASSESSMENT:   69 year old male with PMH of CKD stage III, CAD, NSTEMI, s/p CABG/ICD in 2015, CHF, right nephrectomy 2019, COPD, DM, HTN, HLD. Presented 02/10/20 with progressive low back pain. MRI demonstrated L3-4 and L4-5 spinal listhesis facet arthropathy spinal stenosis with radiculopathy. Pt underwent bilateral L3-4 and 4-5 laminotomy foraminotomy to decompress bilateral L3-4-5 nerve roots with posterior lumbar interbody fusion as well as transforaminal lumbar interbody fusion on 02/10/20. Admitted to CIR on 11/11.  Target d/c date remains 03/04/20.  Per [facility] documentation, pt had IV fluids running overnight (0.9% saline at 75 ml/hr).  Spoke with pt at bedside. Pt reports appetite isn't great related to recently diagnosed UTI. However, last 3 meal completions charted as 100%. Pt reports that he is drinking 1 Ensure Enlive a day. He wonders why he needs to drink an oral nutrition supplement. All questions answered. Pt looking forward to discharge tomorrow.  Admit weight: 102 kg Current weight: 102.8 kg  Meal Completion: 50-100%  Medications reviewed and include: colace, Ensure Enlive BID, lasix, SSI, levemir 5 units daily, tradjenta, miralax, klor-con, senna, vitamin D weekly  Labs reviewed: potassium 3.4, BUN 29, creatinine 1.68 CBG's: 107-159 x 24  hours  UOP: 1375 ml x 24 hours  Diet Order:   Diet Order            Diet Carb Modified Fluid consistency: Thin; Room service appropriate? Yes  Diet effective now                 EDUCATION NEEDS:   Education needs have been addressed  Skin:  Skin Assessment: Skin Integrity Issues: Incisions: back  Last BM:  03/02/20  Height:   Ht Readings from Last 1 Encounters:  02/13/20 5\' 8"  (1.727 m)    Weight:   Wt Readings from Last 1 Encounters:  03/03/20 102.8 kg    BMI:  Body mass index is 34.46 kg/m.  Estimated Nutritional Needs:   Kcal:  1900-2100  Protein:  95-110 grams  Fluid:  2.0 L/day    Gustavus Bryant, MS, RD, LDN Inpatient Clinical Dietitian Please see [facility] for contact information.

## 2020-03-03 NOTE — Progress Notes (Signed)
Pt did not want to be bothered after 9pm. All medication given and 10pm blood sugar taken at 9pm per pt request. Pt did not have to use the bathroom at this time, all needed items within reach, no other cnerns to report.

## 2020-03-03 NOTE — Progress Notes (Addendum)
[place] PHYSICAL MEDICINE & REHABILITATION PROGRESS NOTE   Subjective/Complaints:   Pt reports no change in R hip/SI joint pain.  No weird dreams last night since stopped Dilaudid.   Pain is the same.  LBM yesterday.    ROS:   Pt denies SOB, abd pain, CP, N/V/C/D, and vision changes   Objective:   No results found. Recent Labs    03/02/20 0644  WBC 7.4  HGB 11.0*  HCT 34.4*  PLT 271   Recent Labs    03/02/20 0644  NA 138  K 3.4*  CL 102  CO2 26  GLUCOSE 130*  BUN 29*  CREATININE 1.68*  CALCIUM 9.0    Intake/Output Summary (Last 24 hours) at 03/03/2020 0911 Last data filed at 03/03/2020 0734 Gross per 24 hour  Intake 1060 ml  Output 1125 ml  Net -65 ml        Physical Exam: Vital Signs Blood pressure (!) 153/63, pulse 64, temperature 98.4 F (36.9 C), temperature source Oral, resp. rate 20, height 5\' 8"  (1.727 m), weight 102.8 kg, SpO2 100 %. Constitutional: asked for flu shot- got 11/16- otherwise, appropriate, sitting up in bed, NAD HENT: Normocephalic.  Atraumatic. Eyes: EOMI. No discharge. Cardiovascular: RRR Respiratory: CTA B/L- no W/R/R- good air movement GI: Soft, NT, ND, (+)BS  Skin: Warm and dry.  Intact. Psych: appropriate Musc: No edema in extremities.  No tenderness in extremities. Neuro: Alert Motor: Bilateral upper extremities: 4+/5 proximal distal Right lower extremity: Hip flexion, knee extension, ankle dorsiflexion 4/5 Left lower extremity: Hip flexion, knee extension 4/5, ankle dorsiflexion 4/5  Assessment/Plan: 1. Functional deficits due to spinal stenosis, lumbar radiculopathy  per day of interdisciplinary therapy in a comprehensive inpatient rehab setting.  Physiatrist is providing close team supervision and 24 hour management of active medical problems listed below.  Physiatrist and rehab team continue to assess barriers to discharge/monitor patient progress toward functional and medical goals  Care  Tool:  Bathing    Body parts bathed by patient: Right arm, Left arm, Chest, Front perineal area, Abdomen, Left upper leg, Right upper leg, Right lower leg, Left lower leg, Face   Body parts bathed by helper: Buttocks     Bathing assist Assist Level: Minimal Assistance - Patient > 75%     Upper Body Dressing/Undressing Upper body dressing   What is the patient wearing?: Pull over shirt, Orthosis    Upper body assist Assist Level: Minimal Assistance - Patient > 75%    Lower Body Dressing/Undressing Lower body dressing      What is the patient wearing?: Pants     Lower body assist Assist for lower body dressing: Moderate Assistance - Patient 50 - 74%     Toileting Toileting    Toileting assist Assist for toileting: Moderate Assistance - Patient 50 - 74%     Transfers Chair/bed transfer  Transfers assist     Chair/bed transfer assist level: Contact Guard/Touching assist     Locomotion Ambulation   Ambulation assist      Assist level: Contact Guard/Touching assist Assistive device: Rollator Max distance: 40'   Walk 10 feet activity   Assist  Walk 10 feet activity did not occur: Safety/medical concerns  Assist level: Contact Guard/Touching assist Assistive device: Rollator   Walk 50 feet activity   Assist Walk 50 feet with 2 turns activity did not occur: Safety/medical concerns         Walk 150 feet activity   Assist Walk 150 feet activity  did not occur: Safety/medical concerns         Walk 10 feet on uneven surface  activity   Assist Walk 10 feet on uneven surfaces activity did not occur: Safety/medical concerns   Assist level: Contact Guard/Touching assist Assistive device: Rollator   Wheelchair     Assist Will patient use wheelchair at discharge?: Yes Type of Wheelchair: Manual    Wheelchair assist level: Supervision/Verbal cueing Max wheelchair distance: 100'    Wheelchair 50 feet with 2 turns  activity    Assist    Wheelchair 50 feet with 2 turns activity did not occur: Safety/medical concerns   Assist Level: Supervision/Verbal cueing   Wheelchair 150 feet activity     Assist  Wheelchair 150 feet activity did not occur: Safety/medical concerns       Blood pressure (!) 153/63, pulse 64, temperature 98.4 F (36.9 C), temperature source Oral, resp. rate 20, height 5\' 8"  (1.727 m), weight 102.8 kg, SpO2 100 %.  Medical Problem List and Plan: 1.  Decreased functional mobility secondary to L3-4 4-5 spondylolisthesis arthropathy with radiculopathy/spinal stenosis.  Status post bilateral L3-4 4-5 laminotomy foraminotomies decompression interbody fusion 02/10/2020.  Back corset when out of bed  Continue CIR, 15/7 2.  Antithrombotics: -DVT/anticoagulation: SCDs.    Lovenox             -antiplatelet therapy: Plavix resumed 3. Pain Management: Flexeril as needed, Dilaudid 2-4 mg every 4 hours as needed pain  RIght post hip pain , likely sacroiliac- cont PT, topical analgesic   Relatively controlled with meds on 11/28  11/29- still not able to walk >40 ft- wondering about doing SI joint energy techniques- if doesn't work, will try to get SI joint injections inpt/with Dr Letta Pate.   11/30- will try techniques today 4. Mood: Provide emotional support             -antipsychotic agents: N/A 5. Neuropsych: This patient is capable of making decisions on his own behalf. 6. Skin/Wound Care: Routine skin checks 7. Fluids/Electrolytes/Nutrition: Routine in and outs 8.  Acute on chronic anemia.    Hemoglobin 11.3 on 11/22  Continue to monitor 9.  CAD with CABG/ICD.  Amiodarone 200 mg twice daily, Imdur 30 mg daily, Toprol 25 mg daily 10  COPD.  Check oxygen saturations every shift 11.  CKD stage III with history of right nephrectomy. Has single L kidney  Baseline creatinine 1.47-1.50  Creatinine 1.63 on 11/25, BUN improving-labs ordered for tomorrow  11/29- Cr 1.68-Bun 29- will  give IVFs 75 cc x 1 day and recheck tomorrow  11/30- labs pending- were drawn while I was in room. 12.  Diabetes mellitus with hyperglycemia.  Hemoglobin A1c 5.9.  Currently on Tradjenta 5 mg daily.  Patient on Levemir 10 units daily prior to admission.  Resume as needed  Levemir 5 daily started on 11/27  Remains elevated on 11/28, not make further changes today given recent increase.  11/30- BGs 107-159- only 1 >150- con't regimen 13.  Chronic systolic congestive heart failure.  Entresto 49-51 mg twice daily, Lasix 40 mg twice daily.  Monitor for any signs of fluid overload. Filed Weights   03/01/20 0603 03/02/20 0500 03/03/20 0500  Weight: 101 kg 103 kg 102.8 kg   11/30- weight stable 14.  Hypertension.  Monitor with increased mobility   Vitals:   03/02/20 1928 03/03/20 0603  BP: 118/60 (!) 153/63  Pulse: 70 64  Resp: 18 20  Temp: 98.2 F (36.8 C) 98.4 F (  36.9 C)  SpO2: 99% 100%   On lasix, entresto, toprol   Relatively controlled on 11/28 15.  Hyperlipidemia.  Lipitor 16.  Hypothyroidism.  Synthroid 17.  BPH.  Flomax 0.4 mg daily.    ?  Improving 18.  Sleep disturbance: Continue melatonin 6 mg QHS (PTA medication) 19. Hypokalemia  Potassium 3.3 on 11/25, labs ordered for tomorrow  Magnesium on Monday   Daily supplement initiated  11/29- K+ up to 3.4- will give 40 mEq x1 and Mg is 2.0- so doesn't need repletion  11/30- labs pending currently- con't regimen 20.  Drug-induced constipation  Bowel meds increased on 11/15, increased again on 11/26.   Improving 21. ? UTI  Patient complaining of back pain and states this is typical symptom with UTI, denies other symptoms.  All symptoms resolved  UA?+, Urine culture pending, however given resolution of "symptoms" would consider holding treatment regardless  11/29- no Sx's, but Urine Cx is >100k GNR- will d/w pt if should treat 'UTI". Afebrile; WBC is normal  11/30- will d/w pt  11/30- pt says no Sx's now- at all- might be a  little confused- not clear- per ID pharmacy, won't treat if no Sx's.  Will wait for now on treatment.  22. Had controlled fall to floor today- said only thing hurt was his pride.     LOS: 19 days A FACE TO FACE EVALUATION WAS PERFORMED  Aurorah Schlachter 03/03/2020, 9:11 AM

## 2020-03-03 NOTE — Progress Notes (Signed)
Patient ID: James [name]., male   DOB: January 13, 1951, 69 y.o.   MRN: [id number]  Met with pt to discuss team conference progress toward his goals and discharge tomorrow. His wife ws here yesterday and feels ready for pt to be home. Have ordered a tub bench and will be delivered to room prior to discharge tomorrow. See in am for any last minute questions.

## 2020-03-03 NOTE — Progress Notes (Signed)
Occupational Therapy Session Note  Patient Details  Name: James [name]. MRN: [id number] Date of Birth: [birth year]  Today's Date: 03/03/2020 OT Individual Time: 1791-5056 OT Individual Time Calculation (min): 44 min    Short Term Goals: Week 3:  OT Short Term Goal 1 (Week 3): STGs = LTGs d/t ELOS  Skilled Therapeutic Interventions/Progress Updates:    Pt greeted at time of session reclined in bed agreeable to OT session, no c/o pain initially but did have his usual R hip pain later in session, no number provided and rest breaks PRN. Supine to sit EOB Min A from bed lowered to standard height and not elevated but pt is normally supervision with HOB elevated. Donned lumbar corset with Supervision with extended time and verbal cues for tightness and processing. Pt attached to IV throughout session for fluids, assisted with managing IV pole throughout. Sit to stand CGA from bed and walked short distance to sink level with rollator in same manner. Doffed pants in standing and used reacher to remove from feet, donned new shorts in same manner with Min A to thread RLE and able to thread L with reacher then don over hips in standing. Oral hygiene set up. Attempted to locate cleansing aide, unable to find at this time but pt states he has some at home from previous hospitalization. Ambulated short distance back to wheelchair CGA with rollator and sitting up in chair with alarm on, call bell in reach.   Therapy Documentation Precautions:  Precautions Precautions: Back Required Braces or Orthoses: Spinal Brace Spinal Brace: Lumbar corset, Applied in sitting position Restrictions Weight Bearing Restrictions: No    Therapy/Group: Individual Therapy  Viona Gilmore 03/03/2020, 10:47 AM

## 2020-03-03 NOTE — Progress Notes (Signed)
Occupational Therapy Discharge Summary  Patient Details  Name: James [name]. MRN: [id number] Date of Birth: 08-31-1950   Patient has met 5 of 10 long term goals due to improved activity tolerance, improved balance, postural control, ability to compensate for deficits and improved awareness.  Patient to discharge at overall [place] level.  Patient's care partner is independent to provide the necessary physical and cognitive assistance at discharge.  The pt is currently Min A with LB bathing at shower level with sit to stands, assist for buttocks only as he can use LHS for feet and to maintain back precautions. Pt is Min A for LB dressing with use of reacher to don pants. Pt is able to don lumbar corset with supervision and extended time. CGA with all ADL transfers including to/from toilet, TTB, and multiple surfaces with use of rollator. Wife [name] has completed training for ADLs and functional transfers.   Reasons goals not met: Pt did not meet some goals as he is still CGA for most transfers as he is a high fall risk despite use of his rollator, posterior lean and difficulty sequencing limiting. Pt is still Mod A with toileting tasks for BM as he needs assist wiping and occasionally with clothing management but wife is aware and prepared to assist as needed.   Recommendation:  Patient will benefit from ongoing skilled OT services in home health setting to continue to advance functional skills in the area of BADL and Reduce care partner burden.  Equipment: TTB  Reasons for discharge: discharge from hospital and progress toward goals  Patient/family agrees with progress made and goals achieved: Yes  OT Discharge Precautions/Restrictions  Precautions Precautions: Back;Fall Required Braces or Orthoses: Spinal Brace Spinal Brace: Lumbar corset;Applied in sitting position Restrictions Weight Bearing Restrictions: No Vital Signs Therapy Vitals Temp: 98 F (36.7 C) Pulse Rate:  63 Resp: 14 BP: (!) 98/55 Patient Position (if appropriate): Sitting Oxygen Therapy SpO2: 100 % O2 Device: Room Air Pain Pain Assessment Pain Scale: 0-10 Pain Score: 4  ("moderate" pain that is constant in R hip) ADL ADL Equipment Provided: Reacher, Sock aid, Long-handled sponge Eating: Set up Grooming: Setup Where Assessed-Grooming: Sitting at sink Upper Body Bathing: Supervision/safety Where Assessed-Upper Body Bathing: Shower Lower Body Bathing: Minimal assistance Where Assessed-Lower Body Bathing: Shower Upper Body Dressing: Supervision/safety Where Assessed-Upper Body Dressing: Edge of bed Lower Body Dressing: Minimal assistance Where Assessed-Lower Body Dressing: Standing at sink, Sitting at sink Toileting: Moderate assistance Where Assessed-Toileting: Glass blower/designer: Therapist, music Method: Counselling psychologist: Conservation officer, historic buildings Method: Ambulating Vision Baseline Vision/History: Wears glasses Wears Glasses: Reading only Patient Visual Report: No change from baseline Vision Assessment?: No apparent visual deficits Perception  Perception: Within Functional Limits Praxis Praxis: Intact Cognition Overall Cognitive Status: Within Functional Limits for tasks assessed Arousal/Alertness: Awake/alert Orientation Level: Oriented X4 Awareness: Appears intact Problem Solving: Appears intact Sensation Coordination Gross Motor Movements are Fluid and Coordinated: No Fine Motor Movements are Fluid and Coordinated: No Coordination and Movement Description: slowed movements and somewhat rigid Motor  Motor Motor: Abnormal postural alignment and control Motor - Skilled Clinical Observations: rounded shoulders, forward head, and stiffness with movement Mobility  Transfers Sit to Stand: Contact Guard/Touching assist Stand to Sit: Contact Guard/Touching assist  Trunk/Postural  Assessment  Cervical Assessment Cervical Assessment: Exceptions to [facility] Thoracic Assessment Thoracic Assessment: Exceptions to [facility] Lumbar Assessment Lumbar Assessment: Exceptions to [facility] Postural Control Postural Control: Deficits on evaluation  Postural Limitations: rigidity noted and rounded shoulders  Balance Balance Balance Assessed: Yes Static Sitting Balance Static Sitting - Level of Assistance: 6: Modified independent (Device/Increase time) Dynamic Sitting Balance Dynamic Sitting - Balance Support: Feet supported Dynamic Sitting - Level of Assistance: 5: Stand by assistance Static Standing Balance Static Standing - Balance Support: During functional activity;Bilateral upper extremity supported Static Standing - Level of Assistance: 5: Stand by assistance Dynamic Standing Balance Dynamic Standing - Balance Support: During functional activity;Left upper extremity supported Dynamic Standing - Level of Assistance: 5: Stand by assistance Extremity/Trunk Assessment RUE Assessment RUE Assessment: Within Functional Limits General Strength Comments: weakness present and some stiffness but functional during ADL LUE Assessment LUE Assessment: Exceptions to [facility] Active Range of Motion (AROM) Comments: arthritic shoulder limiting, improved mobility distally General Strength Comments: generalized weakness present, limited at shoulder, fatigues quickly   Viona Gilmore 03/03/2020, 4:56 PM

## 2020-03-04 LAB — GLUCOSE, CAPILLARY: Glucose-Capillary: 110 mg/dL — ABNORMAL HIGH (ref 70–99)

## 2020-03-04 NOTE — Plan of Care (Signed)
  Problem: Consults Goal: RH SPINAL CORD INJURY PATIENT EDUCATION Description:  See Patient Education module for education specifics.  Outcome: Completed/Met Goal: Skin Care Protocol Initiated - if Braden Score 18 or less Description: If consults are not indicated, leave blank or document N/A Outcome: Completed/Met Goal: Nutrition Consult-if indicated Outcome: Completed/Met Goal: Diabetes Guidelines if Diabetic/Glucose > 140 Description: If diabetic or lab glucose is > 140 mg/dl - Initiate Diabetes/Hyperglycemia Guidelines & Document Interventions  Outcome: Completed/Met   Problem: SCI BOWEL ELIMINATION Goal: RH STG MANAGE BOWEL WITH ASSISTANCE Description: STG Manage Bowel with  moderate Assistance. Outcome: Completed/Met Goal: RH STG SCI MANAGE BOWEL WITH MEDICATION WITH ASSISTANCE Description: STG SCI Manage bowel with medication with moderate assistance. Outcome: Completed/Met Goal: RH OTHER STG BOWEL ELIMINATION GOALS W/ASSIST Description: Other STG Bowel Elimination Goals With moderate Assistance. Outcome: Completed/Met   Problem: SCI BLADDER ELIMINATION Goal: RH STG MANAGE BLADDER WITH ASSISTANCE Description: STG Manage Bladder With moderate Assistance Outcome: Completed/Met   Problem: RH SKIN INTEGRITY Goal: RH STG SKIN FREE OF INFECTION/BREAKDOWN Outcome: Completed/Met   Problem: RH SAFETY Goal: RH STG ADHERE TO SAFETY PRECAUTIONS W/ASSISTANCE/DEVICE Description: STG Adhere to Safety Precautions With  moderate Assistance/Device. Outcome: Completed/Met   Problem: RH PAIN MANAGEMENT Goal: RH STG PAIN MANAGED AT OR BELOW PT'S PAIN GOAL Description: Pain less than 2 Outcome: Completed/Met   Problem: RH KNOWLEDGE DEFICIT SCI Goal: RH STG INCREASE KNOWLEDGE OF SELF CARE AFTER SCI Description: Moderate assistance regarding self knowledge after SCI Outcome: Completed/Met

## 2020-03-04 NOTE — Progress Notes (Signed)
[place] PHYSICAL MEDICINE & REHABILITATION PROGRESS NOTE   Subjective/Complaints:   Pt reports that SI joint pain slightly better after SI strain-counter strain maneuvers yesterday- not as good as yesterday but still better.   Wants to do again this AM- did do for pt- since in bed, not sure if improved yet.  Still wants a SI joint injection scheduled by Dr Letta Pate.    ROS:   Pt denies SOB, abd pain, CP, N/V/C/D, and vision changes   Objective:   No results found. Recent Labs    03/02/20 0644  WBC 7.4  HGB 11.0*  HCT 34.4*  PLT 271   Recent Labs    03/02/20 0644 03/03/20 0748  NA 138 138  K 3.4* 3.6  CL 102 103  CO2 26 24  GLUCOSE 130* 141*  BUN 29* 26*  CREATININE 1.68* 1.54*  CALCIUM 9.0 8.9    Intake/Output Summary (Last 24 hours) at 03/04/2020 0842 Last data filed at 03/04/2020 0745 Gross per 24 hour  Intake 580 ml  Output 125 ml  Net 455 ml        Physical Exam: Vital Signs Blood pressure (!) 155/71, pulse 62, temperature 98.2 F (36.8 C), resp. rate 17, height 5\' 8"  (1.727 m), weight 98.9 kg, SpO2 99 %. Constitutional: awake, alert, appropriate, sitting up in bed, NAD HENT: Normocephalic.  Atraumatic. Eyes: EOMI. No discharge. Cardiovascular: RRR Respiratory: CTA B/L- no W/R/R- good air movement GI: Soft, NT, ND, (+)BS   Skin: Warm and dry.  Intact. Psych: appropriate- Ox3 Musc: No edema in extremities.  No tenderness in extremities. TTP over R SI joint- slightly less Neuro: Alert Motor: Bilateral upper extremities: 4+/5 proximal distal Right lower extremity: Hip flexion, knee extension, ankle dorsiflexion 4/5 Left lower extremity: Hip flexion, knee extension 4/5, ankle dorsiflexion 4/5  Assessment/Plan: 1. Functional deficits due to spinal stenosis, lumbar radiculopathy  per day of interdisciplinary therapy in a comprehensive inpatient rehab setting.  Physiatrist is providing close team supervision and 24 hour management of active  medical problems listed below.  Physiatrist and rehab team continue to assess barriers to discharge/monitor patient progress toward functional and medical goals  Care Tool:  Bathing    Body parts bathed by patient: Right arm, Left arm, Chest, Front perineal area, Abdomen, Left upper leg, Right upper leg, Right lower leg, Left lower leg, Face   Body parts bathed by helper: Buttocks     Bathing assist Assist Level: Minimal Assistance - Patient > 75%     Upper Body Dressing/Undressing Upper body dressing   What is the patient wearing?: Pull over shirt, Orthosis    Upper body assist Assist Level: Supervision/Verbal cueing    Lower Body Dressing/Undressing Lower body dressing      What is the patient wearing?: Pants     Lower body assist Assist for lower body dressing: Minimal Assistance - Patient > 75%     Toileting Toileting    Toileting assist Assist for toileting: Moderate Assistance - Patient 50 - 74%     Transfers Chair/bed transfer  Transfers assist     Chair/bed transfer assist level: Contact Guard/Touching assist     Locomotion Ambulation   Ambulation assist      Assist level: Contact Guard/Touching assist Assistive device: Rollator Max distance: 40'   Walk 10 feet activity   Assist  Walk 10 feet activity did not occur: Safety/medical concerns  Assist level: Contact Guard/Touching assist Assistive device: Rollator   Walk 50 feet activity  Assist Walk 50 feet with 2 turns activity did not occur: Safety/medical concerns         Walk 150 feet activity   Assist Walk 150 feet activity did not occur: Safety/medical concerns         Walk 10 feet on uneven surface  activity   Assist Walk 10 feet on uneven surfaces activity did not occur: Safety/medical concerns   Assist level: Contact Guard/Touching assist Assistive device: Rollator   Wheelchair     Assist Will patient use wheelchair at discharge?: Yes Type of  Wheelchair: Manual    Wheelchair assist level: Supervision/Verbal cueing Max wheelchair distance: 100'    Wheelchair 50 feet with 2 turns activity    Assist    Wheelchair 50 feet with 2 turns activity did not occur: Safety/medical concerns   Assist Level: Supervision/Verbal cueing   Wheelchair 150 feet activity     Assist  Wheelchair 150 feet activity did not occur: Safety/medical concerns   Assist Level: Supervision/Verbal cueing   Blood pressure (!) 155/71, pulse 62, temperature 98.2 F (36.8 C), resp. rate 17, height 5\' 8"  (1.727 m), weight 98.9 kg, SpO2 99 %.  Medical Problem List and Plan: 1.  Decreased functional mobility secondary to L3-4 4-5 spondylolisthesis arthropathy with radiculopathy/spinal stenosis.  Status post bilateral L3-4 4-5 laminotomy foraminotomies decompression interbody fusion 02/10/2020.  Back corset when out of bed  Continue CIR, 15/7 2.  Antithrombotics: -DVT/anticoagulation: SCDs.    Lovenox             -antiplatelet therapy: Plavix resumed 3. Pain Management: Flexeril as needed, Dilaudid 2-4 mg every 4 hours as needed pain  RIght post hip pain , likely sacroiliac- cont PT, topical analgesic   Relatively controlled with meds on 11/28  11/29- still not able to walk >40 ft- wondering about doing SI joint energy techniques- if doesn't work, will try to get SI joint injections inpt/with Dr Letta Pate.   11/30- will try techniques today  12/1- did again- mild improvement in pain- will schedule SI joint injection outpt.  4. Mood: Provide emotional support             -antipsychotic agents: N/A 5. Neuropsych: This patient is capable of making decisions on his own behalf. 6. Skin/Wound Care: Routine skin checks 7. Fluids/Electrolytes/Nutrition: Routine in and outs 8.  Acute on chronic anemia.    Hemoglobin 11.3 on 11/22  Continue to monitor 9.  CAD with CABG/ICD.  Amiodarone 200 mg twice daily, Imdur 30 mg daily, Toprol 25 mg daily 10  COPD.   Check oxygen saturations every shift 11.  CKD stage III with history of right nephrectomy. Has single L kidney  Baseline creatinine 1.47-1.50  Creatinine 1.63 on 11/25, BUN improving-labs ordered for tomorrow  11/29- Cr 1.68-Bun 29- will give IVFs 75 cc x 1 day and recheck tomorrow  11/30- labs pending- were drawn while I was in room.  12/1- Cr down to 1.54- improved 12.  Diabetes mellitus with hyperglycemia.  Hemoglobin A1c 5.9.  Currently on Tradjenta 5 mg daily.  Patient on Levemir 10 units daily prior to admission.  Resume as needed  Levemir 5 daily started on 11/27  Remains elevated on 11/28, not make further changes today given recent increase.  11/30- BGs 107-159- only 1 >150- con't regimen 13.  Chronic systolic congestive heart failure.  Entresto 49-51 mg twice daily, Lasix 40 mg twice daily.  Monitor for any signs of fluid overload. Filed Weights   03/02/20 0500 03/03/20 0500  03/04/20 0600  Weight: 103 kg 102.8 kg 98.9 kg   12/1- weight decreased even with IVFs 14.  Hypertension.  Monitor with increased mobility   Vitals:   03/03/20 1325 03/04/20 0446  BP: (!) 98/55 (!) 155/71  Pulse: 63 62  Resp: 14 17  Temp: 98 F (36.7 C) 98.2 F (36.8 C)  SpO2: 100% 99%   On lasix, entresto, toprol   Relatively controlled on 11/28 15.  Hyperlipidemia.  Lipitor 16.  Hypothyroidism.  Synthroid 17.  BPH.  Flomax 0.4 mg daily.    ?  Improving 18.  Sleep disturbance: Continue melatonin 6 mg QHS (PTA medication) 19. Hypokalemia  Potassium 3.3 on 11/25, labs ordered for tomorrow  Magnesium on Monday   Daily supplement initiated  11/29- K+ up to 3.4- will give 40 mEq x1 and Mg is 2.0- so doesn't need repletion  11/30- labs pending currently- con't regimen  12/1- K+ 3.6- better 20.  Drug-induced constipation  Bowel meds increased on 11/15, increased again on 11/26.   Improving 21. ? UTI  Patient complaining of back pain and states this is typical symptom with UTI, denies other  symptoms.  All symptoms resolved  UA?+, Urine culture pending, however given resolution of "symptoms" would consider holding treatment regardless  11/29- no Sx's, but Urine Cx is >100k GNR- will d/w pt if should treat 'UTI". Afebrile; WBC is normal  11/30- will d/w pt  11/30- pt says no Sx's now- at all- might be a little confused- not clear- per ID pharmacy, won't treat if no Sx's.  Will wait for now on treatment.   12/1- explained to pt and wife at length- if he has Sx's, will treat within the next 2-3 weeks- if Sx's develop after that, needs new U/A and Cx from PCP- but can call my clinic in the next 2-3 weeks if symptomatic with UTI Sx's.  22. Had controlled fall to floor today- said only thing hurt was his pride.     I spent a total of35 minutes going over d/c plan with SI joint injection and doing SI joint maneuvers, and discussing U/A and Cx results.   LOS: 20 days A FACE TO FACE EVALUATION WAS PERFORMED  Tesslyn Baumert 03/04/2020, 8:42 AM

## 2020-03-04 NOTE — Progress Notes (Signed)
Patient discharged off of unit with all belongings. Discharge papers/instructions explained by physician assistant to family. Patient and family have no further questions at time of discharge. No complications noted at this time.  Falana Clagg L Lloyde Ludlam  

## 2020-03-05 ENCOUNTER — Telehealth: Payer: Self-pay

## 2020-03-05 DIAGNOSIS — Z4789 Encounter for other orthopedic aftercare: Secondary | ICD-10-CM | POA: Diagnosis not present

## 2020-03-05 DIAGNOSIS — Z951 Presence of aortocoronary bypass graft: Secondary | ICD-10-CM | POA: Diagnosis not present

## 2020-03-05 DIAGNOSIS — Z95810 Presence of automatic (implantable) cardiac defibrillator: Secondary | ICD-10-CM | POA: Diagnosis not present

## 2020-03-05 DIAGNOSIS — Z905 Acquired absence of kidney: Secondary | ICD-10-CM | POA: Diagnosis not present

## 2020-03-05 DIAGNOSIS — Z981 Arthrodesis status: Secondary | ICD-10-CM | POA: Diagnosis not present

## 2020-03-05 DIAGNOSIS — I472 Ventricular tachycardia: Secondary | ICD-10-CM | POA: Diagnosis not present

## 2020-03-05 DIAGNOSIS — I2510 Atherosclerotic heart disease of native coronary artery without angina pectoris: Secondary | ICD-10-CM | POA: Diagnosis not present

## 2020-03-05 DIAGNOSIS — D649 Anemia, unspecified: Secondary | ICD-10-CM | POA: Diagnosis not present

## 2020-03-05 DIAGNOSIS — G479 Sleep disorder, unspecified: Secondary | ICD-10-CM | POA: Diagnosis not present

## 2020-03-05 DIAGNOSIS — E1022 Type 1 diabetes mellitus with diabetic chronic kidney disease: Secondary | ICD-10-CM | POA: Diagnosis not present

## 2020-03-05 DIAGNOSIS — N400 Enlarged prostate without lower urinary tract symptoms: Secondary | ICD-10-CM | POA: Diagnosis not present

## 2020-03-05 DIAGNOSIS — K5903 Drug induced constipation: Secondary | ICD-10-CM | POA: Diagnosis not present

## 2020-03-05 DIAGNOSIS — J449 Chronic obstructive pulmonary disease, unspecified: Secondary | ICD-10-CM | POA: Diagnosis not present

## 2020-03-05 DIAGNOSIS — I130 Hypertensive heart and chronic kidney disease with heart failure and stage 1 through stage 4 chronic kidney disease, or unspecified chronic kidney disease: Secondary | ICD-10-CM | POA: Diagnosis not present

## 2020-03-05 DIAGNOSIS — E669 Obesity, unspecified: Secondary | ICD-10-CM | POA: Diagnosis not present

## 2020-03-05 DIAGNOSIS — E1065 Type 1 diabetes mellitus with hyperglycemia: Secondary | ICD-10-CM | POA: Diagnosis not present

## 2020-03-05 DIAGNOSIS — N183 Chronic kidney disease, stage 3 unspecified: Secondary | ICD-10-CM | POA: Diagnosis not present

## 2020-03-05 DIAGNOSIS — N390 Urinary tract infection, site not specified: Secondary | ICD-10-CM | POA: Diagnosis not present

## 2020-03-05 DIAGNOSIS — M25551 Pain in right hip: Secondary | ICD-10-CM | POA: Diagnosis not present

## 2020-03-05 DIAGNOSIS — I5022 Chronic systolic (congestive) heart failure: Secondary | ICD-10-CM | POA: Diagnosis not present

## 2020-03-05 DIAGNOSIS — I252 Old myocardial infarction: Secondary | ICD-10-CM | POA: Diagnosis not present

## 2020-03-05 DIAGNOSIS — I483 Typical atrial flutter: Secondary | ICD-10-CM | POA: Diagnosis not present

## 2020-03-05 MED ORDER — SULFAMETHOXAZOLE-TRIMETHOPRIM 400-80 MG PO TABS: 1.0000 | ORAL_TABLET | Freq: Two times a day (BID) | ORAL | 0 refills | Status: AC

## 2020-03-05 NOTE — Telephone Encounter (Signed)
I sent in Bactrim 1 pill 2x/day x 7 days to his pharmacy, so they can pick that up.   As I told him and wife in hospital, he kept denying Sx's of UTI, sp didn't want to treat Urine Cx with no Sx's, afebrile and White counter normal, but if he's having Sx's of UTI, will send in antibiotics based on the urine Cx.   Please let family know I sent them in.  Thank you

## 2020-03-05 NOTE — Telephone Encounter (Signed)
James Haw, RN from [facility] called stating that patient is still having problems with possible UTI. Weak, mental status alternated. Please advise.

## 2020-03-06 DIAGNOSIS — I5022 Chronic systolic (congestive) heart failure: Secondary | ICD-10-CM | POA: Diagnosis not present

## 2020-03-06 DIAGNOSIS — N390 Urinary tract infection, site not specified: Secondary | ICD-10-CM | POA: Diagnosis not present

## 2020-03-06 DIAGNOSIS — E1022 Type 1 diabetes mellitus with diabetic chronic kidney disease: Secondary | ICD-10-CM | POA: Diagnosis not present

## 2020-03-06 DIAGNOSIS — Z981 Arthrodesis status: Secondary | ICD-10-CM | POA: Diagnosis not present

## 2020-03-06 DIAGNOSIS — Z4789 Encounter for other orthopedic aftercare: Secondary | ICD-10-CM | POA: Diagnosis not present

## 2020-03-06 DIAGNOSIS — I130 Hypertensive heart and chronic kidney disease with heart failure and stage 1 through stage 4 chronic kidney disease, or unspecified chronic kidney disease: Secondary | ICD-10-CM | POA: Diagnosis not present

## 2020-03-09 ENCOUNTER — Encounter: Payer: Medicare Other | Admitting: Physical Medicine and Rehabilitation

## 2020-03-09 DIAGNOSIS — E1022 Type 1 diabetes mellitus with diabetic chronic kidney disease: Secondary | ICD-10-CM | POA: Diagnosis not present

## 2020-03-09 DIAGNOSIS — I5022 Chronic systolic (congestive) heart failure: Secondary | ICD-10-CM | POA: Diagnosis not present

## 2020-03-09 DIAGNOSIS — I110 Hypertensive heart disease with heart failure: Secondary | ICD-10-CM | POA: Diagnosis not present

## 2020-03-10 DIAGNOSIS — R5381 Other malaise: Secondary | ICD-10-CM | POA: Diagnosis not present

## 2020-03-10 DIAGNOSIS — R531 Weakness: Secondary | ICD-10-CM | POA: Diagnosis not present

## 2020-03-10 DIAGNOSIS — K760 Fatty (change of) liver, not elsewhere classified: Secondary | ICD-10-CM | POA: Diagnosis not present

## 2020-03-10 DIAGNOSIS — R945 Abnormal results of liver function studies: Secondary | ICD-10-CM | POA: Diagnosis not present

## 2020-03-10 DIAGNOSIS — E1022 Type 1 diabetes mellitus with diabetic chronic kidney disease: Secondary | ICD-10-CM | POA: Diagnosis not present

## 2020-03-10 DIAGNOSIS — R944 Abnormal results of kidney function studies: Secondary | ICD-10-CM | POA: Diagnosis not present

## 2020-03-10 DIAGNOSIS — R197 Diarrhea, unspecified: Secondary | ICD-10-CM | POA: Diagnosis not present

## 2020-03-10 DIAGNOSIS — N183 Chronic kidney disease, stage 3 unspecified: Secondary | ICD-10-CM | POA: Diagnosis not present

## 2020-03-10 DIAGNOSIS — N3090 Cystitis, unspecified without hematuria: Secondary | ICD-10-CM | POA: Diagnosis not present

## 2020-03-11 DIAGNOSIS — M2550 Pain in unspecified joint: Secondary | ICD-10-CM | POA: Diagnosis not present

## 2020-03-11 DIAGNOSIS — Z7401 Bed confinement status: Secondary | ICD-10-CM | POA: Diagnosis not present

## 2020-03-11 DIAGNOSIS — R5381 Other malaise: Secondary | ICD-10-CM | POA: Diagnosis not present

## 2020-03-13 DIAGNOSIS — I5022 Chronic systolic (congestive) heart failure: Secondary | ICD-10-CM | POA: Diagnosis not present

## 2020-03-13 DIAGNOSIS — Z4789 Encounter for other orthopedic aftercare: Secondary | ICD-10-CM | POA: Diagnosis not present

## 2020-03-13 DIAGNOSIS — E889 Metabolic disorder, unspecified: Secondary | ICD-10-CM | POA: Diagnosis not present

## 2020-03-13 DIAGNOSIS — E1022 Type 1 diabetes mellitus with diabetic chronic kidney disease: Secondary | ICD-10-CM | POA: Diagnosis not present

## 2020-03-13 DIAGNOSIS — R7401 Elevation of levels of liver transaminase levels: Secondary | ICD-10-CM | POA: Diagnosis not present

## 2020-03-13 DIAGNOSIS — Z981 Arthrodesis status: Secondary | ICD-10-CM | POA: Diagnosis not present

## 2020-03-13 DIAGNOSIS — N390 Urinary tract infection, site not specified: Secondary | ICD-10-CM | POA: Diagnosis not present

## 2020-03-13 DIAGNOSIS — I130 Hypertensive heart and chronic kidney disease with heart failure and stage 1 through stage 4 chronic kidney disease, or unspecified chronic kidney disease: Secondary | ICD-10-CM | POA: Diagnosis not present

## 2020-03-14 ENCOUNTER — Other Ambulatory Visit: Payer: Self-pay

## 2020-03-14 ENCOUNTER — Emergency Department (HOSPITAL_COMMUNITY): Payer: Medicare Other

## 2020-03-14 ENCOUNTER — Inpatient Hospital Stay (HOSPITAL_COMMUNITY)
Admission: EM | Admit: 2020-03-14 | Discharge: 2020-04-04 | DRG: 556 | Disposition: E | Payer: Medicare Other | Attending: Internal Medicine | Admitting: Internal Medicine

## 2020-03-14 DIAGNOSIS — E1022 Type 1 diabetes mellitus with diabetic chronic kidney disease: Secondary | ICD-10-CM | POA: Diagnosis present

## 2020-03-14 DIAGNOSIS — Z951 Presence of aortocoronary bypass graft: Secondary | ICD-10-CM

## 2020-03-14 DIAGNOSIS — Z20822 Contact with and (suspected) exposure to covid-19: Secondary | ICD-10-CM | POA: Diagnosis present

## 2020-03-14 DIAGNOSIS — E669 Obesity, unspecified: Secondary | ICD-10-CM | POA: Diagnosis present

## 2020-03-14 DIAGNOSIS — E1040 Type 1 diabetes mellitus with diabetic neuropathy, unspecified: Secondary | ICD-10-CM | POA: Diagnosis present

## 2020-03-14 DIAGNOSIS — E039 Hypothyroidism, unspecified: Secondary | ICD-10-CM | POA: Diagnosis present

## 2020-03-14 DIAGNOSIS — R5381 Other malaise: Secondary | ICD-10-CM | POA: Diagnosis present

## 2020-03-14 DIAGNOSIS — Z7902 Long term (current) use of antithrombotics/antiplatelets: Secondary | ICD-10-CM

## 2020-03-14 DIAGNOSIS — Z8679 Personal history of other diseases of the circulatory system: Secondary | ICD-10-CM

## 2020-03-14 DIAGNOSIS — F444 Conversion disorder with motor symptom or deficit: Secondary | ICD-10-CM | POA: Diagnosis present

## 2020-03-14 DIAGNOSIS — I2510 Atherosclerotic heart disease of native coronary artery without angina pectoris: Secondary | ICD-10-CM | POA: Diagnosis present

## 2020-03-14 DIAGNOSIS — R200 Anesthesia of skin: Secondary | ICD-10-CM | POA: Diagnosis not present

## 2020-03-14 DIAGNOSIS — Z95810 Presence of automatic (implantable) cardiac defibrillator: Secondary | ICD-10-CM | POA: Diagnosis not present

## 2020-03-14 DIAGNOSIS — I700 Atherosclerosis of aorta: Secondary | ICD-10-CM | POA: Diagnosis not present

## 2020-03-14 DIAGNOSIS — K760 Fatty (change of) liver, not elsewhere classified: Secondary | ICD-10-CM | POA: Diagnosis present

## 2020-03-14 DIAGNOSIS — G894 Chronic pain syndrome: Secondary | ICD-10-CM | POA: Diagnosis present

## 2020-03-14 DIAGNOSIS — B952 Enterococcus as the cause of diseases classified elsewhere: Secondary | ICD-10-CM | POA: Diagnosis present

## 2020-03-14 DIAGNOSIS — N320 Bladder-neck obstruction: Secondary | ICD-10-CM | POA: Diagnosis present

## 2020-03-14 DIAGNOSIS — R338 Other retention of urine: Secondary | ICD-10-CM | POA: Diagnosis not present

## 2020-03-14 DIAGNOSIS — Z6833 Body mass index (BMI) 33.0-33.9, adult: Secondary | ICD-10-CM | POA: Diagnosis not present

## 2020-03-14 DIAGNOSIS — Z87440 Personal history of urinary (tract) infections: Secondary | ICD-10-CM

## 2020-03-14 DIAGNOSIS — D3001 Benign neoplasm of right kidney: Secondary | ICD-10-CM | POA: Diagnosis present

## 2020-03-14 DIAGNOSIS — J449 Chronic obstructive pulmonary disease, unspecified: Secondary | ICD-10-CM | POA: Diagnosis present

## 2020-03-14 DIAGNOSIS — R292 Abnormal reflex: Secondary | ICD-10-CM | POA: Diagnosis present

## 2020-03-14 DIAGNOSIS — I5022 Chronic systolic (congestive) heart failure: Secondary | ICD-10-CM | POA: Diagnosis present

## 2020-03-14 DIAGNOSIS — R1310 Dysphagia, unspecified: Secondary | ICD-10-CM | POA: Diagnosis present

## 2020-03-14 DIAGNOSIS — N183 Chronic kidney disease, stage 3 unspecified: Secondary | ICD-10-CM | POA: Diagnosis present

## 2020-03-14 DIAGNOSIS — N179 Acute kidney failure, unspecified: Secondary | ICD-10-CM

## 2020-03-14 DIAGNOSIS — R9431 Abnormal electrocardiogram [ECG] [EKG]: Secondary | ICD-10-CM | POA: Diagnosis not present

## 2020-03-14 DIAGNOSIS — E861 Hypovolemia: Secondary | ICD-10-CM | POA: Diagnosis present

## 2020-03-14 DIAGNOSIS — Z79890 Hormone replacement therapy (postmenopausal): Secondary | ICD-10-CM

## 2020-03-14 DIAGNOSIS — M545 Low back pain, unspecified: Secondary | ICD-10-CM | POA: Diagnosis not present

## 2020-03-14 DIAGNOSIS — I483 Typical atrial flutter: Secondary | ICD-10-CM | POA: Diagnosis present

## 2020-03-14 DIAGNOSIS — E8889 Other specified metabolic disorders: Secondary | ICD-10-CM | POA: Diagnosis present

## 2020-03-14 DIAGNOSIS — Z466 Encounter for fitting and adjustment of urinary device: Secondary | ICD-10-CM | POA: Diagnosis not present

## 2020-03-14 DIAGNOSIS — Z886 Allergy status to analgesic agent status: Secondary | ICD-10-CM

## 2020-03-14 DIAGNOSIS — E440 Moderate protein-calorie malnutrition: Secondary | ICD-10-CM | POA: Diagnosis present

## 2020-03-14 DIAGNOSIS — Z950 Presence of cardiac pacemaker: Secondary | ICD-10-CM

## 2020-03-14 DIAGNOSIS — Z905 Acquired absence of kidney: Secondary | ICD-10-CM

## 2020-03-14 DIAGNOSIS — R7989 Other specified abnormal findings of blood chemistry: Secondary | ICD-10-CM | POA: Diagnosis present

## 2020-03-14 DIAGNOSIS — I470 Re-entry ventricular arrhythmia: Secondary | ICD-10-CM | POA: Diagnosis not present

## 2020-03-14 DIAGNOSIS — G610 Guillain-Barre syndrome: Secondary | ICD-10-CM

## 2020-03-14 DIAGNOSIS — D649 Anemia, unspecified: Secondary | ICD-10-CM | POA: Diagnosis present

## 2020-03-14 DIAGNOSIS — Z8249 Family history of ischemic heart disease and other diseases of the circulatory system: Secondary | ICD-10-CM

## 2020-03-14 DIAGNOSIS — N390 Urinary tract infection, site not specified: Secondary | ICD-10-CM | POA: Diagnosis present

## 2020-03-14 DIAGNOSIS — K5900 Constipation, unspecified: Secondary | ICD-10-CM | POA: Diagnosis present

## 2020-03-14 DIAGNOSIS — T1490XA Injury, unspecified, initial encounter: Secondary | ICD-10-CM | POA: Diagnosis not present

## 2020-03-14 DIAGNOSIS — M4802 Spinal stenosis, cervical region: Secondary | ICD-10-CM | POA: Diagnosis not present

## 2020-03-14 DIAGNOSIS — F419 Anxiety disorder, unspecified: Secondary | ICD-10-CM | POA: Diagnosis present

## 2020-03-14 DIAGNOSIS — R52 Pain, unspecified: Secondary | ICD-10-CM | POA: Diagnosis not present

## 2020-03-14 DIAGNOSIS — M6281 Muscle weakness (generalized): Secondary | ICD-10-CM | POA: Diagnosis present

## 2020-03-14 DIAGNOSIS — I130 Hypertensive heart and chronic kidney disease with heart failure and stage 1 through stage 4 chronic kidney disease, or unspecified chronic kidney disease: Secondary | ICD-10-CM | POA: Diagnosis present

## 2020-03-14 DIAGNOSIS — Z87891 Personal history of nicotine dependence: Secondary | ICD-10-CM

## 2020-03-14 DIAGNOSIS — L89152 Pressure ulcer of sacral region, stage 2: Secondary | ICD-10-CM | POA: Diagnosis present

## 2020-03-14 DIAGNOSIS — Z881 Allergy status to other antibiotic agents status: Secondary | ICD-10-CM

## 2020-03-14 DIAGNOSIS — Z803 Family history of malignant neoplasm of breast: Secondary | ICD-10-CM

## 2020-03-14 DIAGNOSIS — I252 Old myocardial infarction: Secondary | ICD-10-CM

## 2020-03-14 DIAGNOSIS — R531 Weakness: Secondary | ICD-10-CM

## 2020-03-14 DIAGNOSIS — R202 Paresthesia of skin: Secondary | ICD-10-CM

## 2020-03-14 DIAGNOSIS — Z885 Allergy status to narcotic agent status: Secondary | ICD-10-CM

## 2020-03-14 DIAGNOSIS — Z79899 Other long term (current) drug therapy: Secondary | ICD-10-CM

## 2020-03-14 DIAGNOSIS — N1832 Chronic kidney disease, stage 3b: Secondary | ICD-10-CM | POA: Diagnosis present

## 2020-03-14 DIAGNOSIS — E559 Vitamin D deficiency, unspecified: Secondary | ICD-10-CM | POA: Diagnosis present

## 2020-03-14 DIAGNOSIS — Z825 Family history of asthma and other chronic lower respiratory diseases: Secondary | ICD-10-CM

## 2020-03-14 DIAGNOSIS — Z981 Arthrodesis status: Secondary | ICD-10-CM

## 2020-03-14 DIAGNOSIS — G4700 Insomnia, unspecified: Secondary | ICD-10-CM | POA: Diagnosis present

## 2020-03-14 DIAGNOSIS — M48061 Spinal stenosis, lumbar region without neurogenic claudication: Secondary | ICD-10-CM | POA: Diagnosis present

## 2020-03-14 DIAGNOSIS — M47812 Spondylosis without myelopathy or radiculopathy, cervical region: Secondary | ICD-10-CM | POA: Diagnosis not present

## 2020-03-14 DIAGNOSIS — M50221 Other cervical disc displacement at C4-C5 level: Secondary | ICD-10-CM | POA: Diagnosis not present

## 2020-03-14 DIAGNOSIS — I255 Ischemic cardiomyopathy: Secondary | ICD-10-CM | POA: Diagnosis present

## 2020-03-14 DIAGNOSIS — E785 Hyperlipidemia, unspecified: Secondary | ICD-10-CM | POA: Diagnosis present

## 2020-03-14 DIAGNOSIS — Z794 Long term (current) use of insulin: Secondary | ICD-10-CM

## 2020-03-14 DIAGNOSIS — R7401 Elevation of levels of liver transaminase levels: Secondary | ICD-10-CM | POA: Diagnosis present

## 2020-03-14 DIAGNOSIS — K4020 Bilateral inguinal hernia, without obstruction or gangrene, not specified as recurrent: Secondary | ICD-10-CM | POA: Diagnosis not present

## 2020-03-14 DIAGNOSIS — Z8546 Personal history of malignant neoplasm of prostate: Secondary | ICD-10-CM

## 2020-03-14 LAB — URINALYSIS, ROUTINE W REFLEX MICROSCOPIC
Bilirubin Urine: NEGATIVE
Glucose, UA: NEGATIVE mg/dL
Hgb urine dipstick: NEGATIVE
Ketones, ur: NEGATIVE mg/dL
Leukocytes,Ua: NEGATIVE
Nitrite: NEGATIVE
Protein, ur: NEGATIVE mg/dL
Specific Gravity, Urine: 1.008 (ref 1.005–1.030)
pH: 5 (ref 5.0–8.0)

## 2020-03-14 LAB — CBC
HCT: 36.2 % — ABNORMAL LOW (ref 39.0–52.0)
Hemoglobin: 11.4 g/dL — ABNORMAL LOW (ref 13.0–17.0)
MCH: 28.1 pg (ref 26.0–34.0)
MCHC: 31.5 g/dL (ref 30.0–36.0)
MCV: 89.4 fL (ref 80.0–100.0)
Platelets: 336 10*3/uL (ref 150–400)
RBC: 4.05 MIL/uL — ABNORMAL LOW (ref 4.22–5.81)
RDW: 14 % (ref 11.5–15.5)
WBC: 9.8 10*3/uL (ref 4.0–10.5)
nRBC: 0 % (ref 0.0–0.2)

## 2020-03-14 LAB — HEPATIC FUNCTION PANEL
ALT: 230 U/L — ABNORMAL HIGH (ref 0–44)
AST: 102 U/L — ABNORMAL HIGH (ref 15–41)
Albumin: 3.1 g/dL — ABNORMAL LOW (ref 3.5–5.0)
Alkaline Phosphatase: 179 U/L — ABNORMAL HIGH (ref 38–126)
Bilirubin, Direct: 0.2 mg/dL (ref 0.0–0.2)
Indirect Bilirubin: 0.5 mg/dL (ref 0.3–0.9)
Total Bilirubin: 0.7 mg/dL (ref 0.3–1.2)
Total Protein: 7 g/dL (ref 6.5–8.1)

## 2020-03-14 LAB — BASIC METABOLIC PANEL
Anion gap: 11 (ref 5–15)
BUN: 40 mg/dL — ABNORMAL HIGH (ref 8–23)
CO2: 23 mmol/L (ref 22–32)
Calcium: 9.2 mg/dL (ref 8.9–10.3)
Chloride: 103 mmol/L (ref 98–111)
Creatinine, Ser: 1.8 mg/dL — ABNORMAL HIGH (ref 0.61–1.24)
GFR, Estimated: 40 mL/min — ABNORMAL LOW (ref >60)
Glucose, Bld: 134 mg/dL — ABNORMAL HIGH (ref 70–99)
Potassium: 3.8 mmol/L (ref 3.5–5.1)
Sodium: 137 mmol/L (ref 135–145)

## 2020-03-14 LAB — RESP PANEL BY RT-PCR (FLU A&B, COVID) ARPGX2
Influenza A by PCR: NEGATIVE
Influenza B by PCR: NEGATIVE
SARS Coronavirus 2 by RT PCR: NEGATIVE

## 2020-03-14 LAB — CBG MONITORING, ED: Glucose-Capillary: 127 mg/dL — ABNORMAL HIGH (ref 70–99)

## 2020-03-14 LAB — AMMONIA: Ammonia: 11 umol/L (ref 9–35)

## 2020-03-14 MED ORDER — TAMSULOSIN HCL 0.4 MG PO CAPS
0.4000 mg | ORAL_CAPSULE | Freq: Every day | ORAL | Status: DC
  Administered 2020-03-15 – 2020-03-20 (×7): 0.4 mg via ORAL
  Filled 2020-03-14 (×7): qty 1

## 2020-03-14 MED ORDER — ALBUTEROL SULFATE (2.5 MG/3ML) 0.083% IN NEBU: 2.5000 mg | INHALATION_SOLUTION | RESPIRATORY_TRACT | Status: DC | PRN

## 2020-03-14 MED ORDER — LORAZEPAM 0.5 MG PO TABS
0.5000 mg | ORAL_TABLET | Freq: Two times a day (BID) | ORAL | Status: DC | PRN
  Administered 2020-03-16 – 2020-03-20 (×2): 0.5 mg via ORAL
  Filled 2020-03-14 (×2): qty 1

## 2020-03-14 MED ORDER — MELATONIN 3 MG PO TABS
6.0000 mg | ORAL_TABLET | Freq: Every day | ORAL | Status: DC
  Administered 2020-03-15 – 2020-03-20 (×6): 6 mg via ORAL
  Filled 2020-03-14 (×7): qty 2

## 2020-03-14 MED ORDER — VITAMIN D (ERGOCALCIFEROL) 1.25 MG (50000 UNIT) PO CAPS
50000.0000 [IU] | ORAL_CAPSULE | ORAL | Status: DC
  Administered 2020-03-16: 11:00:00 50000 [IU] via ORAL
  Filled 2020-03-14: qty 1

## 2020-03-14 MED ORDER — OXYCODONE HCL 5 MG PO TABS
5.0000 mg | ORAL_TABLET | ORAL | Status: DC | PRN
Start: 2020-03-14 — End: 2020-03-16
  Administered 2020-03-15: 10 mg via ORAL
  Administered 2020-03-16: 5 mg via ORAL
  Filled 2020-03-14: qty 1
  Filled 2020-03-14: qty 2

## 2020-03-14 MED ORDER — CYCLOBENZAPRINE HCL 10 MG PO TABS
10.0000 mg | ORAL_TABLET | Freq: Three times a day (TID) | ORAL | Status: DC | PRN
  Administered 2020-03-16 – 2020-03-19 (×4): 10 mg via ORAL
  Filled 2020-03-14 (×4): qty 1

## 2020-03-14 MED ORDER — INSULIN GLARGINE 100 UNIT/ML ~~LOC~~ SOLN: 5.0000 [IU] | Freq: Every day | SUBCUTANEOUS | Status: DC

## 2020-03-14 MED ORDER — MULTIVITAMINS PO CAPS: 1.0000 | ORAL_CAPSULE | Freq: Every day | ORAL | Status: DC

## 2020-03-14 MED ORDER — BISACODYL 5 MG PO TBEC: 5.0000 mg | DELAYED_RELEASE_TABLET | Freq: Every day | ORAL | Status: DC | PRN

## 2020-03-14 MED ORDER — INSULIN GLARGINE 100 UNIT/ML ~~LOC~~ SOLN
5.0000 [IU] | Freq: Every day | SUBCUTANEOUS | Status: DC
  Administered 2020-03-15 – 2020-03-20 (×6): 5 [IU] via SUBCUTANEOUS
  Filled 2020-03-14 (×8): qty 0.05

## 2020-03-14 MED ORDER — ASCORBIC ACID 500 MG PO TABS
1000.0000 mg | ORAL_TABLET | Freq: Two times a day (BID) | ORAL | Status: DC
  Administered 2020-03-15 – 2020-03-20 (×13): 1000 mg via ORAL
  Filled 2020-03-14 (×14): qty 2

## 2020-03-14 MED ORDER — SODIUM CHLORIDE 0.9 % IV BOLUS
500.0000 mL | Freq: Once | INTRAVENOUS | Status: AC
  Administered 2020-03-14: 21:00:00 500 mL via INTRAVENOUS

## 2020-03-14 MED ORDER — SODIUM CHLORIDE 0.9 % IV SOLN: INTRAVENOUS | Status: DC

## 2020-03-14 MED ORDER — DOCUSATE SODIUM 100 MG PO CAPS
100.0000 mg | ORAL_CAPSULE | Freq: Two times a day (BID) | ORAL | Status: DC
  Administered 2020-03-15 – 2020-03-20 (×13): 100 mg via ORAL
  Filled 2020-03-14 (×13): qty 1

## 2020-03-14 MED ORDER — ACETAMINOPHEN 325 MG PO TABS
650.0000 mg | ORAL_TABLET | Freq: Four times a day (QID) | ORAL | Status: DC | PRN
  Administered 2020-03-15 – 2020-03-18 (×2): 650 mg via ORAL
  Filled 2020-03-14 (×2): qty 2

## 2020-03-14 MED ORDER — METOPROLOL SUCCINATE ER 25 MG PO TB24
12.5000 mg | ORAL_TABLET | Freq: Every day | ORAL | Status: DC
  Administered 2020-03-15 – 2020-03-20 (×6): 12.5 mg via ORAL
  Filled 2020-03-14 (×6): qty 1

## 2020-03-14 MED ORDER — LEVOTHYROXINE SODIUM 88 MCG PO TABS
88.0000 ug | ORAL_TABLET | Freq: Every day | ORAL | Status: DC
  Administered 2020-03-16 – 2020-03-20 (×5): 88 ug via ORAL
  Filled 2020-03-14 (×5): qty 1

## 2020-03-14 MED ORDER — ADULT MULTIVITAMIN W/MINERALS CH
1.0000 | ORAL_TABLET | Freq: Every day | ORAL | Status: DC
  Administered 2020-03-15 – 2020-03-16 (×2): 1 via ORAL
  Filled 2020-03-14 (×3): qty 1

## 2020-03-14 MED ORDER — AMIODARONE HCL 200 MG PO TABS
200.0000 mg | ORAL_TABLET | Freq: Every day | ORAL | Status: DC
Start: 2020-03-15 — End: 2020-03-21
  Administered 2020-03-15 – 2020-03-20 (×6): 200 mg via ORAL
  Filled 2020-03-14 (×6): qty 1

## 2020-03-14 MED ORDER — INSULIN ASPART 100 UNIT/ML ~~LOC~~ SOLN
0.0000 [IU] | Freq: Three times a day (TID) | SUBCUTANEOUS | Status: DC
  Administered 2020-03-15 (×2): 2 [IU] via SUBCUTANEOUS
  Administered 2020-03-16 (×2): 1 [IU] via SUBCUTANEOUS
  Administered 2020-03-17: 13:00:00 2 [IU] via SUBCUTANEOUS
  Administered 2020-03-17: 07:00:00 1 [IU] via SUBCUTANEOUS
  Administered 2020-03-17 – 2020-03-18 (×2): 2 [IU] via SUBCUTANEOUS
  Administered 2020-03-18 – 2020-03-19 (×4): 1 [IU] via SUBCUTANEOUS
  Administered 2020-03-19 – 2020-03-20 (×3): 2 [IU] via SUBCUTANEOUS
  Administered 2020-03-20: 17:00:00 1 [IU] via SUBCUTANEOUS

## 2020-03-14 MED ORDER — LIDOCAINE 5 % EX PTCH
1.0000 | MEDICATED_PATCH | CUTANEOUS | Status: DC
  Filled 2020-03-14 (×3): qty 1

## 2020-03-14 MED ORDER — CLOPIDOGREL BISULFATE 75 MG PO TABS
75.0000 mg | ORAL_TABLET | Freq: Every day | ORAL | Status: DC
  Administered 2020-03-15: 10:00:00 75 mg via ORAL
  Filled 2020-03-14: qty 1

## 2020-03-14 MED ORDER — ENOXAPARIN SODIUM 40 MG/0.4ML ~~LOC~~ SOLN
40.0000 mg | Freq: Every day | SUBCUTANEOUS | Status: DC
  Administered 2020-03-15: 01:00:00 40 mg via SUBCUTANEOUS
  Filled 2020-03-14: qty 0.4

## 2020-03-14 NOTE — ED Triage Notes (Signed)
Pt with hx of back surgery in November here for decreased mobility x 1 week. Pt woke up this morning with numbness in bilateral legs and weakness in from the knees down. Only pain is area of developing bed sore in sacral area.

## 2020-03-14 NOTE — ED Notes (Signed)
Dr. Hampton Abbot at bedside at this time.

## 2020-03-14 NOTE — ED Notes (Signed)
Culture sent down with urine.  

## 2020-03-14 NOTE — ED Notes (Signed)
Pt reports that he self caths and has recently had a UTI.

## 2020-03-14 NOTE — ED Provider Notes (Signed)
[facility] EMERGENCY DEPARTMENT Provider Note   CSN: [id number] Arrival date & time: 03/29/2020  1235     History No chief complaint on file.   James Rutter. is a 69 y.o. male hx of CKD, COPD, diabetes who presented with weakness. Patient had a laminectomy done about a month ago. Patient finished 21 days of rehab. Patient was discharged about a week ago and initially was able to walk but has not been able to walk since then. Patient went to [facility] on 12/7 and has mild AKI with creatinine 1.77 and elevated LFTs. Patient had ultrasound that showed hepatic steatosis. Patient was thought to have side effect of Bactrim and his antibiotics were switched to Cipro. His urine culture did not grow anything since then. Patient was then sent home and he is essentially bedbound. He even developed a sacral decub ulcer since then. Per the wife, patient is so weak that she is unable to care for him. He also intermittently gets confused.   The history is provided by the patient.       Past Medical History:  Diagnosis Date  . Abnormal nuclear stress test 11/03/2017   Added automatically from request for surgery [phone]  . Benign hypertension with chronic kidney disease, stage III ([place]) 04/11/2018  . CAD in native artery 08/27/2014  . Chronic systolic congestive heart failure ([place]) 10/26/2017  . CKD stage 3 due to type 1 diabetes mellitus ([place]) 08/28/2014   Overview:  Cr 1.3 at discharge 08/21/14  . COPD GOLD II 10/17/2014   Followed in Pulmonary clinic/ [facility]/ [place]  - PFTs   10/17/2014 FEV1  2.09 ( 53%) ratio 62 no sign better p B2 and dlco 63% and corrects to 85%  - 10/17/2014 p extensive coaching HFA effectiveness =  90%    > try stiolto    . DM (diabetes mellitus) ([place])   . Dyspnea 09/05/2014   Followed in Pulmonary clinic/ [facility]/ [place] - 09/05/2014  Walked RA x 3 laps @ 185 ft each stopped due to end of study, nl pace, no desat   - pfts 10/17/14 dlco 63%  on Amiodarone > may benefit from 6 month f/u but defer to Cards    . Essential hypertension 09/11/2014   Changed acei to ARB  09/05/14 due to pseudowheeze> improved 10/17/14    . Hyperlipidemia   . Hypertension   . Hypertensive heart disease with congestive heart failure ([place]) 08/27/2014  . ICD (implantable cardioverter-defibrillator) in place 08/27/2014  . Metabolic bone disease 69/67/8938  . NSTEMI (non-ST elevated myocardial infarction) ([place]) 02/12/2014  . Obesity 04/04/7508   Complicated by dm/ hbp and low erv on pfts 10/17/14    . On amiodarone therapy 08/27/2014  . Renal oncocytoma of right kidney 01/29/2018  . S/P ICD (internal cardiac defibrillator) procedure 02/15/2014   followed by [facility] EP  . Typical atrial flutter ([place]) 08/27/2014  . Ventricular tachyarrhythmia ([place]) 08/27/2014  . Vitamin D deficiency 01/17/2018    Patient Active Problem List   Diagnosis Date Noted  . Abnormal urinalysis   . Postoperative pain   . Stage 3b chronic kidney disease ([place])   . Drug induced constipation   . Hypokalemia   . Labile blood pressure   . Chronic diastolic congestive heart failure ([place])   . Controlled type 2 diabetes mellitus with hyperglycemia, without long-term current use of insulin ([place])   . Solitary kidney, acquired   . Left hip pain   .  Lumbar radiculopathy 02/13/2020  . Spondylolisthesis of lumbar region 02/10/2020  . Hypertension   . Heart failure ([place])   . DM (diabetes mellitus) ([place])   . Benign hypertension with chronic kidney disease, stage III ([place]) 04/11/2018  . Renal oncocytoma of right kidney 01/29/2018  . Hyperkalemia 01/17/2018  . Metabolic bone disease 35/46/5681  . Vitamin D deficiency 01/17/2018  . Abnormal nuclear stress test 11/03/2017  . Chronic systolic congestive heart failure ([place]) 10/26/2017  . Discoloration of tongue 09/25/2017  . Obesity 10/18/2014  . COPD GOLD II 10/17/2014  . Essential hypertension 09/11/2014  . Dyspnea 09/05/2014  . CKD stage  3 due to type 1 diabetes mellitus ([place]) 08/28/2014  . Coronary artery disease involving native coronary artery of native heart with angina pectoris ([place]) 08/27/2014  . Hyperlipidemia 08/27/2014  . Hypertensive heart disease with congestive heart failure ([place]) 08/27/2014  . ICD (implantable cardioverter-defibrillator) in place 08/27/2014  . On amiodarone therapy 08/27/2014  . Systolic CHF, chronic ([place]) 08/27/2014  . Typical atrial flutter ([place]) 08/27/2014  . Ventricular tachyarrhythmia ([place]) 08/27/2014  . CAD in native artery 08/27/2014  . Atrial arrhythmia 02/15/2014  . S/P ICD (internal cardiac defibrillator) procedure 02/15/2014  . Cardiac dysrhythmia 02/12/2014  . NSTEMI (non-ST elevated myocardial infarction) ([place]) 02/12/2014    Past Surgical History:  Procedure Laterality Date  . CORONARY ARTERY BYPASS GRAFT  2008  . EYE SURGERY    . ICD IMPLANT    . MOHS SURGERY     Right Arm  . NEPHRECTOMY Right        Family History  Problem Relation Age of Onset  . Heart disease Mother   . Breast cancer Mother   . Heart disease Father   . Cancer Daughter   . COPD Brother     Social History   Tobacco Use  . Smoking status: Former Smoker    Packs/day: 1.00    Years: 38.00    Pack years: 38.00    Types: Cigarettes    Quit date: 04/04/2006    Years since quitting: 13.9  . Smokeless tobacco: Never Used  Vaping Use  . Vaping Use: Never used  Substance Use Topics  . Alcohol use: No    Alcohol/week: 0.0 standard drinks    Comment: Rarely  . Drug use: No    Home Medications Prior to Admission medications   Medication Sig Start Date End Date Taking? Authorizing Provider  acetaminophen (TYLENOL) 500 MG tablet Take 500 mg by mouth 2 (two) times daily.     [provider]  amiodarone (PACERONE) 200 MG tablet Take 1 tablet (200 mg total) by mouth 2 (two) times daily. 03/03/20   Angiulli, Lavon Paganini, PA-C  Ascorbic Acid (VITAMIN C) 1000 MG tablet Take 1 tablet (1,000 mg  total) by mouth 2 (two) times daily. 03/03/20   Angiulli, Lavon Paganini, PA-C  atorvastatin (LIPITOR) 40 MG tablet TAKE 1 TABLET BY MOUTH ONCE DAILY. *NEEDS APPOINTMENT FOR REFILLS* 03/03/20   Angiulli, Lavon Paganini, PA-C  b complex vitamins capsule Take 1 capsule by mouth daily. 03/03/20   Angiulli, Lavon Paganini, PA-C  clopidogrel (PLAVIX) 75 MG tablet Take 1 tablet (75 mg total) by mouth daily. 03/03/20   Angiulli, Lavon Paganini, PA-C  Coenzyme Q10 (COQ10) 100 MG CAPS Take 100 mg by mouth in the morning and at bedtime.     [provider]  cyclobenzaprine (FLEXERIL) 10 MG tablet Take 1 tablet (10 mg total) by mouth 3 (three) times daily as needed  for muscle spasms. 03/03/20   Angiulli, Lavon Paganini, PA-C  docusate sodium (COLACE) 100 MG capsule Take 1 capsule (100 mg total) by mouth 2 (two) times daily. 03/03/20   Angiulli, Lavon Paganini, PA-C  ergocalciferol (VITAMIN D2) 1.25 MG (50000 UT) capsule Take 1 capsule (50,000 Units total) by mouth every Monday. 03/09/20   Angiulli, Lavon Paganini, PA-C  furosemide (LASIX) 40 MG tablet TAKE 1 TABLET BY MOUTH TWICE(2) DAILY 03/03/20   Angiulli, Lavon Paganini, PA-C  insulin detemir (LEVEMIR) 100 UNIT/ML FlexPen Inject 5 Units into the skin daily. 03/03/20   Angiulli, Lavon Paganini, PA-C  isosorbide mononitrate (IMDUR) 30 MG 24 hr tablet Take 1 tablet (30 mg total) by mouth every morning. (Hold if blood pressure under 100 sbp) 03/03/20   Angiulli, Lavon Paganini, PA-C  levothyroxine (SYNTHROID) 88 MCG tablet Take 1 tablet (88 mcg total) by mouth daily before breakfast. 03/03/20   Angiulli, Lavon Paganini, PA-C  lidocaine (LIDODERM) 5 % Place 1 patch onto the skin daily. Remove & Discard patch within 12 hours or as directed by MD 03/03/20   Angiulli, Lavon Paganini, PA-C  melatonin 3 MG TABS tablet Take 2 tablets (6 mg total) by mouth at bedtime. 03/03/20   Angiulli, Lavon Paganini, PA-C  metoprolol succinate (TOPROL XL) 25 MG 24 hr tablet Take 1 tablet (25 mg total) by mouth daily. 03/03/20   Angiulli, Lavon Paganini, PA-C   Multiple Vitamin (MULTIVITAMIN) capsule Take 1 capsule by mouth daily.    [provider]  nitroGLYCERIN (NITROSTAT) 0.4 MG SL tablet Place 1 tablet (0.4 mg total) under the tongue every 5 (five) minutes as needed for chest pain. 03/05/19   Richardo Priest, MD  [facility] powder Apply 1 application topically daily as needed for irritation. 09/13/19   [provider]  omega-3 acid ethyl esters (LOVAZA) 1 g capsule Take 2 capsules (2 g total) by mouth in the morning and at bedtime. Patient taking differently: Take 1 g by mouth in the morning and at bedtime.  11/26/19   Richardo Priest, MD  oxyCODONE (OXY IR/ROXICODONE) 5 MG immediate release tablet Take 1-2 tablets (5-10 mg total) by mouth every 4 (four) hours as needed for severe pain. 03/03/20   Angiulli, Lavon Paganini, PA-C  polyethylene glycol (MIRALAX / GLYCOLAX) 17 g packet Take 17 g by mouth 2 (two) times daily. 03/03/20   Angiulli, Lavon Paganini, PA-C  potassium chloride (KLOR-CON) 10 MEQ tablet Take 1 tablet (10 mEq total) by mouth daily. 03/03/20   Angiulli, Lavon Paganini, PA-C  sacubitril-valsartan (ENTRESTO) 49-51 MG Take 1 tablet by mouth 2 (two) times daily. 01/13/20   Richardo Priest, MD  senna (SENOKOT) 8.6 MG tablet Take 2 tablets by mouth daily.     [provider]  sitaGLIPtin (JANUVIA) 25 MG tablet Take 25 mg by mouth daily.     [provider]  tamsulosin (FLOMAX) 0.4 MG CAPS capsule Take 1 capsule (0.4 mg total) by mouth daily. 03/03/20   Angiulli, Lavon Paganini, PA-C  Zinc 50 MG TABS Take 50 mg by mouth daily.     [provider]    Allergies    Baclofen and Codeine  Review of Systems   Review of Systems  Neurological: Positive for weakness.  Psychiatric/Behavioral: Positive for confusion.  All other systems reviewed and are negative.   Physical Exam Updated Vital Signs BP 135/62   Pulse 72   Temp 98.2 F (36.8 C) (Oral)   Resp (!) 21   Ht  5\' 8"  (1.727 m)   Wt 98.9 kg   SpO2 98%   BMI 33.15  kg/m   Physical Exam Vitals and nursing note reviewed.  Constitutional:      Comments: Very deconditioned and chronically ill.  HENT:     Head: Normocephalic.     Nose: Nose normal.     Mouth/Throat:     Mouth: Mucous membranes are dry.  Eyes:     Extraocular Movements: Extraocular movements intact.     Pupils: Pupils are equal, round, and reactive to light.  Cardiovascular:     Rate and Rhythm: Normal rate and regular rhythm.     Pulses: Normal pulses.     Heart sounds: Normal heart sounds.  Pulmonary:     Effort: Pulmonary effort is normal.     Breath sounds: Normal breath sounds.  Abdominal:     General: Abdomen is flat.     Palpations: Abdomen is soft.  Musculoskeletal:     Cervical back: Normal range of motion.     Comments: Laminectomy scar is healing well. Stage I sacral decub ulcer  Skin:    General: Skin is warm.     Capillary Refill: Capillary refill takes less than 2 seconds.  Neurological:     Comments: Patient has no obvious facial droop or speech deficit. Patient has 4 of strength bilateral arms and 3 out of 5 strength bilateral legs. Patient does have normal reflexes bilaterally.  Psychiatric:        Mood and Affect: Mood normal.     ED Results / Procedures / Treatments   Labs (all labs ordered are listed, but only abnormal results are displayed) Labs Reviewed  BASIC METABOLIC PANEL - Abnormal; Notable for the following components:      Result Value   Glucose, Bld 134 (*)    BUN 40 (*)    Creatinine, Ser 1.80 (*)    GFR, Estimated 40 (*)    All other components within normal limits  CBC - Abnormal; Notable for the following components:   RBC 4.05 (*)    Hemoglobin 11.4 (*)    HCT 36.2 (*)    All other components within normal limits  CBG MONITORING, ED - Abnormal; Notable for the following components:   Glucose-Capillary 127 (*)    All other components within normal limits  RESP PANEL BY RT-PCR (FLU A&B, COVID) ARPGX2  URINALYSIS, ROUTINE W  REFLEX MICROSCOPIC  HEPATIC FUNCTION PANEL  HEPATITIS PANEL, ACUTE  AMMONIA    EKG None  Radiology No results found.  Procedures Procedures (including critical care time)   Angiocath insertion Performed by: Wandra Arthurs  Consent: Verbal consent obtained. Risks and benefits: risks, benefits and alternatives were discussed Time out: Immediately prior to procedure a "time out" was called to verify the correct patient, procedure, equipment, support staff and site/side marked as required.  Preparation: Patient was prepped and draped in the usual sterile fashion.  Vein Location: R antecube  Ultrasound Guided  Gauge: 20 long   Normal blood return and flush without difficulty Patient tolerance: Patient tolerated the procedure well with no immediate complications.     Medications Ordered in ED Medications  sodium chloride 0.9 % bolus 500 mL (has no administration in time range)    ED Course  I have reviewed the triage vital signs and the nursing notes.  Pertinent labs & imaging results that were available during my care of the patient were reviewed by me and considered in my medical  decision making (see chart for details).    MDM Rules/Calculators/A&P                         James Cambre. is a 69 y.o. male here presenting with worsening weakness. Patient appears very deconditioned. Patient had a recent laminectomy and rehab but condition worsening after rehab. I am unclear what is going on. He had recent AKI and elevated LFTs so consider hepatic encephalopathy versus worsening uremia. Also consider cauda equina as well. Plan to get CBC, CMP, urinalysis, chest x-ray. Patient has a pacemaker and initially ordered MRI of the brain and lumbar spine but he is amenable to get it today. Will get CT head and CT lumbar spine instead. Will need admission   10:24 PM  Cr up to 1.8 now. LFTs slightly elevated. UA showed no UTI. CT showed that hardware is in place. [name] from  neurosurgery evaluated patient and did not think patient have postop issue. At this point, since patient is very deconditioned and has AKI, will admit for hydration and physical therapy eval.    Final Clinical Impression(s) / ED Diagnoses Final diagnoses:  None    Rx / DC Orders ED Discharge Orders    None       Drenda Freeze, MD 03/29/2020 2226

## 2020-03-14 NOTE — H&P (Signed)
History and Physical  James [name]. QPR:[id number] DOB: 09-30-1950 DOA: 03/05/2020 1235  Referring physician: Emilee Hero (ED) PCP: James Roger, MD  Outpatient Specialists: neurosurg [facility]); urology ([facility])  HISTORY   Chief Complaint: weakness  HPI: James [name]. is a 69 y.o. male with hx significant for CAD s/p CABG, hx of AFL and VT s/p ICD, CKD stage 3, DMT2 on insulin, localized prostate CA, hx of R renal oncocytoma s/p R nephrectomy who had laminectomy and spinal fusion of L3-L5 in November for lumbar spinal stenosis and has been having subacute progressive generalized weakness since surgery.  Patient was admitted to rehab on 02/13/20 and discharged on 03/04/20 post back surgery.  Per patient and wife, patient was doing well in inpatient rehab about 2 weeks into the program and actually felt that he was declining prior to discharge from rehab. Wife reported that patient was mostly sleeping all day while in rehab. He was also diagnosed with UTI (urine culture 11/27 grew Serratia) and treated with a  bactrim course. Patient relies on self straight catheterization due to urinary retention about 3 times a day (wife, caregiver, assists with caths).   Since arriving home, patient has basically been unable to get out of bed. Patient feels that his RLE is weaker than the left, which is similar to prior to the surgery -- but overall his stamina has significantly decreased over the past month. Two days ago, patient and home health aid noted a new sacral decubitus ulcer. Back incision has healed completely. During recent straight caths, wife has noted that urine actually appears clearer than a few weeks ago, with small amount of sediment towards the end of collection. Denies fevers or chills. Wife also noted labile BP as low as 70/50s but then increasing back to 993-570V systolics.   Prior to surgery earlier this year, patient states that he was driving and able to perform all ADLs.    No chest pain or cardiac sx, although mostly bedbound. Occasional cough bringing up phlegm, chronic. No sick contacts. Has moderate constipation intermittently. Unclear if any new weight changes.    Review of Systems:  + weakness, generalized, worse in lower extremities RLE > LLE - no fevers/chills - no cough; no URI sx - no chest pain, dyspnea on exertion - no edema, PND, orthopnea - no nausea/vomiting; no abdominal pain; no tarry, melanotic or bloody stools - no dysuria, increased urinary frequency - no significant weight changes Rest of systems reviewed are negative, except as per above history.   ED course:  Vitals Blood pressure (!) 151/65, pulse (!) 59, temperature 98 F (36.7 C), temperature source Oral, resp. rate 17, height 5' 8"  (1.727 m), weight 98.9 kg, SpO2 99 %. Received NS bolus 500cc x 1.   Past Medical History:  Diagnosis Date  . Abnormal nuclear stress test 11/03/2017   Added automatically from request for surgery [phone]  . Benign hypertension with chronic kidney disease, stage III ([place]) 04/11/2018  . CAD in native artery 08/27/2014  . Chronic systolic congestive heart failure ([place]) 10/26/2017  . CKD stage 3 due to type 1 diabetes mellitus ([place]) 08/28/2014   Overview:  Cr 1.3 at discharge 08/21/14  . COPD GOLD II 10/17/2014   Followed in Pulmonary clinic/ [facility]/ [place]  - PFTs   10/17/2014 FEV1  2.09 ( 53%) ratio 62 no sign better p B2 and dlco 63% and corrects to 85%  - 10/17/2014 p extensive coaching HFA  effectiveness =  90%    > try stiolto    . DM (diabetes mellitus) ([place])   . Dyspnea 09/05/2014   Followed in Pulmonary clinic/ [facility]/ [place] - 09/05/2014  Walked RA x 3 laps @ 185 ft each stopped due to end of study, nl pace, no desat   - pfts 10/17/14 dlco 63% on Amiodarone > may benefit from 6 month f/u but defer to Cards    . Essential hypertension 09/11/2014   Changed acei to ARB  09/05/14 due to pseudowheeze> improved 10/17/14    . Hyperlipidemia   .  Hypertension   . Hypertensive heart disease with congestive heart failure ([place]) 08/27/2014  . ICD (implantable cardioverter-defibrillator) in place 08/27/2014  . Metabolic bone disease 01/60/1093  . NSTEMI (non-ST elevated myocardial infarction) ([place]) 02/12/2014  . Obesity 2/35/5732   Complicated by dm/ hbp and low erv on pfts 10/17/14    . On amiodarone therapy 08/27/2014  . Renal oncocytoma of right kidney 01/29/2018  . S/P ICD (internal cardiac defibrillator) procedure 02/15/2014   followed by [facility] EP  . Typical atrial flutter ([place]) 08/27/2014  . Ventricular tachyarrhythmia ([place]) 08/27/2014  . Vitamin D deficiency 01/17/2018   Past Surgical History:  Procedure Laterality Date  . CORONARY ARTERY BYPASS GRAFT  2008  . EYE SURGERY    . ICD IMPLANT    . MOHS SURGERY     Right Arm  . NEPHRECTOMY Right     Social History:  reports that he quit smoking about 13 years ago. His smoking use included cigarettes. He has a 38.00 pack-year smoking history. He has never used smokeless tobacco. He reports that he does not drink alcohol and does not use drugs.  Allergies  Allergen Reactions  . Baclofen Other (See Comments)    Mental status changes- made patient go "out of his mind"- PATIENT HAS ONLY 1 KIDNEY!!  . Nsaids Other (See Comments)    Can have ONLY Tylenol- due to having only 1 kidney  . Codeine Rash    Family History  Problem Relation Age of Onset  . Heart disease Mother   . Breast cancer Mother   . Heart disease Father   . Cancer Daughter   . COPD Brother       Prior to Admission medications   Medication Sig Start Date End Date Taking? Authorizing Provider  acetaminophen (TYLENOL) 500 MG tablet Take 500 mg by mouth 2 (two) times daily.    Yes [provider]  amiodarone (PACERONE) 200 MG tablet Take 1 tablet (200 mg total) by mouth 2 (two) times daily. 03/03/20  Yes Angiulli, Lavon Paganini, PA-C  Ascorbic Acid (VITAMIN C) 1000 MG tablet Take 1 tablet (1,000 mg  total) by mouth 2 (two) times daily. 03/03/20  Yes Angiulli, Lavon Paganini, PA-C  atorvastatin (LIPITOR) 40 MG tablet TAKE 1 TABLET BY MOUTH ONCE DAILY. *NEEDS APPOINTMENT FOR REFILLS* Patient taking differently: Take 40 mg by mouth at bedtime. 03/03/20  Yes Angiulli, Lavon Paganini, PA-C  b complex vitamins capsule Take 1 capsule by mouth daily. 03/03/20  Yes Angiulli, Lavon Paganini, PA-C  ciprofloxacin (CIPRO) 500 MG tablet Take 500 mg by mouth 2 (two) times daily. 03/10/20 03/15/20 Yes [provider]  clopidogrel (PLAVIX) 75 MG tablet Take 1 tablet (75 mg total) by mouth daily. 03/03/20  Yes Angiulli, Lavon Paganini, PA-C  Coenzyme Q10 (COQ10) 100 MG CAPS Take 100 mg by mouth in the morning and at bedtime.    Yes [provider]  docusate sodium (COLACE) 100 MG capsule Take 1 capsule (100 mg total) by mouth 2 (two) times daily. 03/03/20  Yes Angiulli, Lavon Paganini, PA-C  ergocalciferol (VITAMIN D2) 1.25 MG (50000 UT) capsule Take 1 capsule (50,000 Units total) by mouth every Monday. 03/09/20  Yes Angiulli, Lavon Paganini, PA-C  furosemide (LASIX) 40 MG tablet TAKE 1 TABLET BY MOUTH TWICE(2) DAILY Patient taking differently: Take 40 mg by mouth 2 (two) times daily. 03/03/20  Yes Angiulli, Lavon Paganini, PA-C  insulin detemir (LEVEMIR) 100 UNIT/ML FlexPen Inject 5 Units into the skin daily. Patient taking differently: Inject 5 Units into the skin at bedtime. 03/03/20  Yes Angiulli, Lavon Paganini, PA-C  isosorbide mononitrate (IMDUR) 30 MG 24 hr tablet Take 1 tablet (30 mg total) by mouth every morning. (Hold if blood pressure under 100 sbp) 03/03/20  Yes Angiulli, Lavon Paganini, PA-C  levothyroxine (SYNTHROID) 88 MCG tablet Take 1 tablet (88 mcg total) by mouth daily before breakfast. 03/03/20  Yes Angiulli, Lavon Paganini, PA-C  LORazepam (ATIVAN) 0.5 MG tablet Take 0.5 mg by mouth 2 (two) times daily as needed for anxiety.   Yes [provider]  melatonin 3 MG TABS tablet Take 2 tablets (6 mg total) by mouth at bedtime.  03/03/20  Yes Angiulli, Lavon Paganini, PA-C  Multiple Vitamin (MULTIVITAMIN) capsule Take 1 capsule by mouth daily.   Yes [provider]  nitroGLYCERIN (NITROSTAT) 0.4 MG SL tablet Place 1 tablet (0.4 mg total) under the tongue every 5 (five) minutes as needed for chest pain. 03/05/19  Yes Richardo Priest, MD  NON FORMULARY Apply 1 application topically See admin instructions. Absorbine  Veter[employer] Liniment Gel- Apply to affected area of sacrum three times a day for pain   Yes [provider]  [facility] powder Apply 1 application topically daily as needed for irritation. 09/13/19  Yes [provider]  omega-3 acid ethyl esters (LOVAZA) 1 g capsule Take 2 capsules (2 g total) by mouth in the morning and at bedtime. Patient taking differently: Take 1 g by mouth in the morning and at bedtime. 11/26/19  Yes Richardo Priest, MD  oxyCODONE (OXY IR/ROXICODONE) 5 MG immediate release tablet Take 1-2 tablets (5-10 mg total) by mouth every 4 (four) hours as needed for severe pain. 03/03/20  Yes Angiulli, Lavon Paganini, PA-C  potassium chloride (KLOR-CON) 10 MEQ tablet Take 1 tablet (10 mEq total) by mouth daily. 03/03/20  Yes Angiulli, Lavon Paganini, PA-C  sacubitril-valsartan (ENTRESTO) 49-51 MG Take 1 tablet by mouth 2 (two) times daily. 01/13/20  Yes Richardo Priest, MD  senna (SENOKOT) 8.6 MG tablet Take 2 tablets by mouth daily as needed for constipation.   Yes [provider]  sitaGLIPtin (JANUVIA) 25 MG tablet Take 25 mg by mouth daily.    Yes [provider]  tamsulosin (FLOMAX) 0.4 MG CAPS capsule Take 1 capsule (0.4 mg total) by mouth daily. Patient taking differently: Take 0.4 mg by mouth at bedtime. 03/03/20  Yes Angiulli, Lavon Paganini, PA-C  Zinc 50 MG TABS Take 50 mg by mouth daily.    Yes [provider]  cyclobenzaprine (FLEXERIL) 10 MG tablet Take 1 tablet (10 mg total) by mouth 3 (three) times daily as needed for muscle spasms. 03/03/20   Angiulli, Lavon Paganini,  PA-C  lidocaine (LIDODERM) 5 % Place 1 patch onto the skin daily. Remove & Discard patch within 12 hours or as directed by MD Patient not taking: No sig reported 03/03/20  Angiulli, Lavon Paganini, PA-C  metoprolol succinate (TOPROL XL) 25 MG 24 hr tablet Take 1 tablet (25 mg total) by mouth daily. 03/03/20   Angiulli, Lavon Paganini, PA-C  polyethylene glycol (MIRALAX / GLYCOLAX) 17 g packet Take 17 g by mouth 2 (two) times daily. Patient not taking: No sig reported 03/03/20   Angiulli, Lavon Paganini, PA-C    PHYSICAL EXAM   Temp:  [98 F (36.7 C)-98.4 F (36.9 C)] 98 F (36.7 C) (12/11 2240) Pulse Rate:  [58-77] 59 (12/11 2240) Resp:  [14-22] 17 (12/11 2240) BP: (116-154)/(50-136) 151/65 (12/11 2240) SpO2:  [93 %-100 %] 99 % (12/11 2240) Weight:  [98.9 kg] 98.9 kg (12/11 1823)  BP (!) 151/65 (BP Location: Left Arm)   Pulse (!) 59   Temp 98 F (36.7 C) (Oral)   Resp 17   Ht 5' 8"  (1.727 m)   Wt 98.9 kg   SpO2 99%   BMI 33.15 kg/m    GEN obese elderly caucasian male; resting in bed, appears uncomfortable but not in distress  HEENT NCAT EOM intact PERRL; clear oropharynx, no cervical LAD; dry mucus membranes  JVP estimated 5 cm H2O above RA; no HJR ; no carotid bruits b/l ;  CV regular normal rate; quiet S1 and S2; no m/r/g or S3/S4; PMI non displaced; no parasternal heave  ICD pocket over L shoulder; no pocket hematoma or tenderness; Well-healed sternotomy scar RESP CTA b/l; breathing unlabored and symmetric  ABD soft NT ND +normoactive BS  EXT warm throughout b/l; no peripheral edema b/l  PULSES  DP and radials 2+ intact b/l  SKIN/MSK sacral decub stage 2 ulcer (1.5 cm) present  NEURO/PSYCH AAOx4; Mild 4/5 RLE weakness (foot dorsiflexion). Normal affect and mood   DATA   LABS ON ADMISSION:  Basic Metabolic Panel: Recent Labs  Lab 03/23/2020 1320  NA 137  K 3.8  CL 103  CO2 23  GLUCOSE 134*  BUN 40*  CREATININE 1.80*  CALCIUM 9.2   CBC: Recent Labs  Lab 04/02/2020 1320   WBC 9.8  HGB 11.4*  HCT 36.2*  MCV 89.4  PLT 336   Liver Function Tests: Recent Labs  Lab 03/30/2020 2114  AST 102*  ALT 230*  ALKPHOS 179*  BILITOT 0.7  PROT 7.0  ALBUMIN 3.1*   No results for input(s): LIPASE, AMYLASE in the last 168 hours. Recent Labs  Lab 03/07/2020 2114  AMMONIA 11   Coagulation:  No results found for: INR, PROTIME No results found for: PTT Lactic Acid, Venous:  No results found for: LATICACIDVEN Cardiac Enzymes: No results for input(s): CKTOTAL, CKMB, CKMBINDEX, TROPONINI in the last 168 hours. Urinalysis:    Component Value Date/Time   COLORURINE YELLOW 03/13/2020 [place] 03/19/2020 1609   LABSPEC 1.008 03/08/2020 1609   PHURINE 5.0 03/28/2020 1609   GLUCOSEU NEGATIVE 03/08/2020 1609   HGBUR NEGATIVE 03/07/2020 1609   BILIRUBINUR NEGATIVE 03/31/2020 1609   KETONESUR NEGATIVE 03/13/2020 1609   PROTEINUR NEGATIVE 03/15/2020 1609   NITRITE NEGATIVE 03/06/2020 1609   LEUKOCYTESUR NEGATIVE 04/03/2020 1609    BNP (last 3 results) Recent Labs    01/13/20 1403  PROBNP 1,199*   CBG: Recent Labs  Lab 03/24/2020 1800  GLUCAP 127*    Radiological Exams on Admission: CT Head Wo Contrast  Result Date: 03/06/2020 CLINICAL DATA:  Awoke with bilateral leg weakness and numbness. EXAM: CT HEAD WITHOUT CONTRAST TECHNIQUE: Contiguous axial images were obtained from the base of the skull  through the vertex without intravenous contrast. COMPARISON:  Head CT 06/25/2019 FINDINGS: Brain: Brain volume is normal for age. No intracranial hemorrhage, mass effect, or midline shift. No hydrocephalus. The basilar cisterns are patent. No evidence of territorial infarct or acute ischemia. No extra-axial or intracranial fluid collection. Vascular: Atherosclerosis of skullbase vasculature without hyperdense vessel or abnormal calcification. Skull: No fracture or focal lesion. Sinuses/Orbits: No acute findings. Bilateral cataract resection. Chronic  opacification of right mastoid air cells with slight improvement from prior exam. Chronic opacification of lower left mastoid air cells. Other: None. IMPRESSION: 1. No acute intracranial abnormality. 2. Chronic opacification of right mastoid air cells with slight improvement from prior exam. Chronic opacification of lower left mastoid air cells. Electronically Signed   By: Keith Rake M.D.   On: 03/12/2020 20:38   CT L-SPINE NO CHARGE  Result Date: 03/13/2020 CLINICAL DATA:  Back pain x6 weeks. Cauda carina syndrome. Unable to walk. Recent laminectomy. EXAM: CT ABDOMEN AND PELVIS WITHOUT CONTRAST CT LUMBAR SPINE WITHOUT CONTRAST TECHNIQUE: Multidetector CT imaging of the abdomen and pelvis was performed following the standard protocol without IV contrast. Multiplanar CT images of the lumbar spine were reconstructed from contemporary CT of the Abdomen, and Pelvis COMPARISON:  MRI lumbar spine dated 12/13/2019. CT L-spine dated June 28, 2019. FINDINGS: Lower chest: There is atelectasis at the lung bases.The heart size is normal. Hepatobiliary: The liver is normal. There is gallbladder sludge without CT evidence for acute cholecystitis.There is no biliary ductal dilation. Pancreas: Normal contours without ductal dilatation. No peripancreatic fluid collection. Spleen: Unremarkable. Adrenals/Urinary Tract: --Adrenal glands: Unremarkable. --Right kidney/ureter: Patient is status post prior right-sided nephrectomy. --Left kidney/ureter: No hydronephrosis or radiopaque kidney stones. --Urinary bladder: Unremarkable. Stomach/Bowel: --Stomach/Duodenum: No hiatal hernia or other gastric abnormality. Normal duodenal course and caliber. --Small bowel: Unremarkable. --Colon: There is a moderate amount of stool in the rectum. --Appendix: Normal. Vascular/Lymphatic: Atherosclerotic calcification is present within the non-aneurysmal abdominal aorta, without hemodynamically significant stenosis. --No retroperitoneal  lymphadenopathy. --No mesenteric lymphadenopathy. --No pelvic or inguinal lymphadenopathy. Reproductive: There is significant prostatomegaly. Other: No ascites or free air. There are bilateral fat containing inguinal hernias. Musculoskeletal. The patient is status post prior L3 through L5 posterior fusion with interbody spacers at the L3-L4 and L4-L5 levels. The patient is status post prior L3-L4 laminectomy. Evaluation of the lower lumbar spine is limited by streak artifact from the adjacent metallic hardware. There is moderate disc height loss at the L5-S1 level. There is probable moderate right-sided osseous neural foraminal narrowing. There is no acute compression fracture. IMPRESSION: 1. No CT evidence for acute intra-abdominal or intrapelvic pathology. 2. Status post prior right-sided nephrectomy. 3. Moderate amount of stool in the rectum. 4. Marked prostatomegaly. 5. Bilateral fat containing inguinal hernias. 6. The patient is status post prior L3 through L5 posterior fusion and decompression with multilevel interbody spacers. The hardware is intact. There is persistent at least moderate neural foraminal stenosis at the L5 level on the right. There is no acute compression fracture. Evaluation of the spinal canal is significantly limited by metallic streak artifact. Aortic Atherosclerosis (ICD10-I70.0). Electronically Signed   By: Constance Holster M.D.   On: 04/03/2020 20:40   DG Chest Port 1 View  Result Date: 03/11/2020 CLINICAL DATA:  Generalized weakness. Leg weakness. EXAM: PORTABLE CHEST 1 VIEW COMPARISON:  08/28/2019 FINDINGS: Left-sided pacemaker in place. Post median sternotomy. Stable mild cardiomegaly. Unchanged mediastinal contours. Aortic atherosclerosis. Minimal subsegmental atelectasis or scarring at the left lung base. No new or  progressive airspace disease. No pleural fluid or pneumothorax. No pulmonary edema. No acute osseous abnormalities are seen. IMPRESSION: Stable mild  cardiomegaly. No acute abnormality. Aortic Atherosclerosis (ICD10-I70.0). Electronically Signed   By: Keith Rake M.D.   On: 03/20/2020 18:35   CT Renal Stone Study  Result Date: 03/26/2020 CLINICAL DATA:  Back pain x6 weeks. Cauda carina syndrome. Unable to walk. Recent laminectomy. EXAM: CT ABDOMEN AND PELVIS WITHOUT CONTRAST CT LUMBAR SPINE WITHOUT CONTRAST TECHNIQUE: Multidetector CT imaging of the abdomen and pelvis was performed following the standard protocol without IV contrast. Multiplanar CT images of the lumbar spine were reconstructed from contemporary CT of the Abdomen, and Pelvis COMPARISON:  MRI lumbar spine dated 12/13/2019. CT L-spine dated June 28, 2019. FINDINGS: Lower chest: There is atelectasis at the lung bases.The heart size is normal. Hepatobiliary: The liver is normal. There is gallbladder sludge without CT evidence for acute cholecystitis.There is no biliary ductal dilation. Pancreas: Normal contours without ductal dilatation. No peripancreatic fluid collection. Spleen: Unremarkable. Adrenals/Urinary Tract: --Adrenal glands: Unremarkable. --Right kidney/ureter: Patient is status post prior right-sided nephrectomy. --Left kidney/ureter: No hydronephrosis or radiopaque kidney stones. --Urinary bladder: Unremarkable. Stomach/Bowel: --Stomach/Duodenum: No hiatal hernia or other gastric abnormality. Normal duodenal course and caliber. --Small bowel: Unremarkable. --Colon: There is a moderate amount of stool in the rectum. --Appendix: Normal. Vascular/Lymphatic: Atherosclerotic calcification is present within the non-aneurysmal abdominal aorta, without hemodynamically significant stenosis. --No retroperitoneal lymphadenopathy. --No mesenteric lymphadenopathy. --No pelvic or inguinal lymphadenopathy. Reproductive: There is significant prostatomegaly. Other: No ascites or free air. There are bilateral fat containing inguinal hernias. Musculoskeletal. The patient is status post prior L3  through L5 posterior fusion with interbody spacers at the L3-L4 and L4-L5 levels. The patient is status post prior L3-L4 laminectomy. Evaluation of the lower lumbar spine is limited by streak artifact from the adjacent metallic hardware. There is moderate disc height loss at the L5-S1 level. There is probable moderate right-sided osseous neural foraminal narrowing. There is no acute compression fracture. IMPRESSION: 1. No CT evidence for acute intra-abdominal or intrapelvic pathology. 2. Status post prior right-sided nephrectomy. 3. Moderate amount of stool in the rectum. 4. Marked prostatomegaly. 5. Bilateral fat containing inguinal hernias. 6. The patient is status post prior L3 through L5 posterior fusion and decompression with multilevel interbody spacers. The hardware is intact. There is persistent at least moderate neural foraminal stenosis at the L5 level on the right. There is no acute compression fracture. Evaluation of the spinal canal is significantly limited by metallic streak artifact. Aortic Atherosclerosis (ICD10-I70.0). Electronically Signed   By: Constance Holster M.D.   On: 03/11/2020 20:40    EKG: telemetry reviewed at bedside shows sinus 60-70s with occasional PVC  Prior TTE 04/29/19 1. Left ventricular ejection fraction, by visual estimation, is 40% from  limited para sternal views without definity. Left ventricular septal wall  thickness was mildly increased. Normal left ventricular posterior wall  thickness. There is no left ventricular hypertrophy.  2. Left ventricular diastolic parameters are consistent with Grade II  diastolic dysfunction (pseudonormalization).  3. Mildly dilated left ventricular internal cavity size.  4. The left ventricle demonstrates global hypokinesis.  5. Global right ventricle has mildly reduced systolic function.The right  ventricular size is normal. No increase in right ventricular wall  thickness.  6. Left atrial size was moderately dilated.   7. Right atrial size was normal.  8. The mitral valve is normal in structure. Mild mitral valve  regurgitation. No evidence of mitral stenosis.  9.  The tricuspid valve is normal in structure.  10. The tricuspid valve is normal in structure. Tricuspid valve  regurgitation is not demonstrated.  11. The aortic valve is tricuspid. Aortic valve regurgitation is not  visualized. Mild aortic valve sclerosis without stenosis.  12. The pulmonic valve was not well visualized. Pulmonic valve  regurgitation is not visualized.  13. The inferior vena cava is normal in size with <50% respiratory  variability, suggesting right atrial pressure of 8 mmHg.  14. The aortic root was not well visualized and the ascending aorta was  not well visualized.   I have reviewed the patient's previous electronic chart records, labs, and other data.   ASSESSMENT AND PLAN   Assessment: Taiten Brawn. is a 69 y.o. male with med hx significant for CAD s/p CABG, hx of AFL and VT s/p ICD, CKD stage 3, DMT2 on insulin, localized prostate CA, hx of R renal oncocytoma s/p R nephrectomy who had laminectomy and spinal fusion of L3-L5 in November for lumbar spinal stenosis and has been having subacute progressive generalized weakness, more pronounced in lower extremities for 3-4 weeks that may have preceded his discharge from inpatient rehab. Suspect multifactorial cause for weakness with underlying deconditioning. No acute neurosurgical needs per neurosurg. Initially suspected UTI although he had completed abx course and initial UA is negative. Appears dry on exam with AKI; otherwise no other clear focal sx of infection and no signs of sepsis but will pan culture in case.  Anticipate patient will have inpatient PT needs.    Principal Problem:   Generalized weakness Active Problems:   CKD stage 3 due to type 1 diabetes mellitus (HCC)   S/P ICD (internal cardiac defibrillator) procedure   Systolic CHF, chronic (HCC)   S/p  nephrectomy   Transaminitis   Sacral decubitus ulcer, stage II ([place])  Plan:   # Generalized weakness (subacute) with more pronounced lower extremity weakness post back surgery, suspect multifactorial etiology and deconditioning, possible ?polypharmacy > CT abd/pelvis showed L3-L5 hardware in place, no major acute abnormalities - neurosurg consulted: no acute neurosurg needs - urine culture and blood cultures x 2 pending - check CK and TSH - cont gentle IV fluids NS @ 100 cc/hr x 10 hrs ovenright - PT and OT eval ordered - caution with opioids, monitor response to pain meds and flexeril closely  - nutritional consult  # Recent L3-L5 laminectomy and spinal fusion in November 2021 > persistent 4/5 RLE weakness on exam with stable foraminal L5 stenosis noted on admission CT - prn tylenol for mild pain - lidoderm patch ordered  - resume home oxycodone 5-57m q4h prn for severe pain - docusate 1056mbid standing and prn dulcolax for refractory constipation - monitor closely for sleepiness or fatigue with opioids - appreciate neurosurg recs  # CKD stage 3 with hx of R renal nephrectomy, likely pre-renal due to dehydration > Cr 1.8 on admission (baseline Cr ~1.5) - hold home entresto - hold home lasix - gentle IV fluids as above - low threshold to obtain L renal ultrasound  # Mild transaminitis with AST 100s, ALT 200s, alk phos 179  > not significantly different from LFTs in November 2021. Prior abd [place] hepatic steatosis - this is likely not a new process and may not be contributing to current presentation of weakness - will still check CK level - hold off on resuming lipitor and consider resuming at lower dose or changing statin (although highly unlikely atorvastatin is directly contributing  since chronic home med - reduce amiodarone dose from 258m bid to qd - consider discussion with outpatient cardiology especially if weakness is not improving  # Recent UTI s/p bactrim  course - UA clean but urine culture pending - hold off abx at this time   # Urinary retention in setting of prostatomegaly and localized prostate CA. [facility] Not yet started treatment for prostate.  - bladder scan q shift and in & out caths q8h - would avoid indwelling cath if possible, since prior UTI likely related to foley - resume flomax 0.467mnightly  # Hx of CAD s/p CABG and ICM with EF 40%  > dry on exam, reported fluctuating BP at home - hold off resuming lasix - hold off resuming entresto given AKI and would not resume during hospitalization unless renal function significantly improves   - hold off resuming statin for now until LFT in AM stable and CK pending (although unlikely statin is contributing) - reducing dose toprol to 12.55m37maily to start tomorrow - reducing amio - resume plavix 755m76m  - hold off imdur for now unless BP persistently > 140s322Gtolics  # Hx of VT and aflutter on amiodarone (not on anticoag) s/p ICD - resume amiodarone but at lower dose - this level of mild transaminitis not typical of amio toxicity but can consider on differential - consider cards curbside or outpatient discussion re amiodarone regimen  # Hypothyroidism on synthroid - checking TSH and T4  - resume home synthroid 88 mcg qd    # DMT2 on insulin  - resume lantus at 5 units nightly - ACHS fingersticks and sliding scale AC   # Hx of anxiety and insomnia - resume ativan 0.55mg 27m prn anxiety - melatonin qhs for sleep  # Hx of COPD - prn albuterol nebs for wheezing  # Mild anemia, normocytic - likely multifactorial/anemia of chronic disease > Hb 11 on admission, unchanged from prior  - continue to monitor daily CBC    DVT Prophylaxis: lovenox subq Code Status:  Full Code Family Communication: discussed plan with patient and wife at bedside Disposition Plan: admit to med surg   Patient contact: Extended Emergency Contact Information Primary  Emergency Contact: Passarella,Nancy Address: [id number]: [phone]: Spouse  Time spent: > 65 minutes  CarolColbert EwingTriad Hospitalists Pager [phone]-7AM, please contact night-coverage [email or web address] Password [email or web address] [facility]1/2021, 11:31 PM

## 2020-03-14 NOTE — ED Notes (Signed)
Patient transported to CT 

## 2020-03-14 NOTE — ED Notes (Addendum)
Pt cleaned of bowel incontinence. Linens changed. Pt has a stage 2 pressure ulcer to sacrum - wound bed is pink and red. Sacral dressing applied.

## 2020-03-14 NOTE — Consult Note (Signed)
Neurosurgery Consultation  Reason for Consult: Difficulty ambulating Referring Physician: Darl Householder  CC: Weakness  HPI: This is a 69 y.o. man that previously had lumbar surgery with Dr. Arnoldo Morale roughly a month ago. He reports that preop he had radicular pain in both legs that improved immediately post-op. He has baseline BUE/BLE neuropathy that was stable until today. He had worsening of his baseline numbness that prompted him to go to the ED, but it has now returned to normal. He does report difficulty ambulating, he's not sure why. He denies it being to pain or poor balance, just feels overall / globally weaker. No new focal weakness, new numbness, or parasthesias, no recent change in bowel or bladder function. Of note, he has an ICD that is not MRI compatible.    ROS: A 14 point ROS was performed and is negative except as noted in the HPI.   PMHx:  Past Medical History:  Diagnosis Date  . Abnormal nuclear stress test 11/03/2017   Added automatically from request for surgery [phone]  . Benign hypertension with chronic kidney disease, stage III ([place]) 04/11/2018  . CAD in native artery 08/27/2014  . Chronic systolic congestive heart failure ([place]) 10/26/2017  . CKD stage 3 due to type 1 diabetes mellitus ([place]) 08/28/2014   Overview:  Cr 1.3 at discharge 08/21/14  . COPD GOLD II 10/17/2014   Followed in Pulmonary clinic/ [facility]/ [facility]  - PFTs   10/17/2014 FEV1  2.09 ( 53%) ratio 62 no sign better p B2 and dlco 63% and corrects to 85%  - 10/17/2014 p extensive coaching HFA effectiveness =  90%    > try stiolto    . DM (diabetes mellitus) ([place])   . Dyspnea 09/05/2014   Followed in Pulmonary clinic/ [facility]/ [facility] - 09/05/2014  Walked RA x 3 laps @ 185 ft each stopped due to end of study, nl pace, no desat   - pfts 10/17/14 dlco 63% on Amiodarone > may benefit from 6 month f/u but defer to Cards    . Essential hypertension 09/11/2014   Changed acei to ARB  09/05/14 due to pseudowheeze> improved  10/17/14    . Hyperlipidemia   . Hypertension   . Hypertensive heart disease with congestive heart failure ([place]) 08/27/2014  . ICD (implantable cardioverter-defibrillator) in place 08/27/2014  . Metabolic bone disease 41/32/4401  . NSTEMI (non-ST elevated myocardial infarction) ([place]) 02/12/2014  . Obesity 0/27/2536   Complicated by dm/ hbp and low erv on pfts 10/17/14    . On amiodarone therapy 08/27/2014  . Renal oncocytoma of right kidney 01/29/2018  . S/P ICD (internal cardiac defibrillator) procedure 02/15/2014   [facility]  . Typical atrial flutter ([place]) 08/27/2014  . Ventricular tachyarrhythmia ([place]) 08/27/2014  . Vitamin D deficiency 01/17/2018   FamHx:  Family History  Problem Relation Age of Onset  . Heart disease Mother   . Breast cancer Mother   . Heart disease Father   . Cancer Daughter   . COPD Brother    SocHx:  reports that he quit smoking about 13 years ago. His smoking use included cigarettes. He has a 38.00 pack-year smoking history. He has never used smokeless tobacco. He reports that he does not drink alcohol and does not use drugs.  Exam: Vital signs in last 24 hours: Temp:  [98.2 F (36.8 C)-98.4 F (36.9 C)] 98.2 F (36.8 C) (12/11 1556) Pulse Rate:  [60-77] 63 (12/11 2145) Resp:  [14-22] 16 (12/11 2100)  BP: (116-154)/(59-82) 153/62 (12/11 2145) SpO2:  [93 %-100 %] 100 % (12/11 2145) Weight:  [98.9 kg] 98.9 kg (12/11 1823) General: Awake, alert, cooperative, lying in bed in NAD Head: Normocephalic and atruamatic HEENT: Neck supple Pulmonary: breathing room air comfortably, no evidence of increased work of breathing Psych: affect full Abdomen: S NT ND Extremities: Warm and well perfused x4 Neuro: Strength diffusely 4/5 with focal weakness in the right wrist extensors that's 4-/5 and in the bilateral dorsiflexors that's 4-/5, unable to test L shoulder muscle groups due to pain with passive/active ROM, sensation decreased to the shins  bilaterally and from the wrists to fingertips bilaterally, which pt reports is baseline. Incision is well healed but with a dressing on his sacrum that he reports is due to a decubitus ulcer Reflexes diffusely 1+, no hoffman's, no clonus   Assessment and Plan: 69 y.o. man s/p L3-5 PLIF. CT L-spine personally reviewed, which shows post-operative changes with hardware intact.   -no acute neurosurgical intervention indicated at this time, pt's exam is more consistent with a diffuse neuropathic / myopathic condition, does not appear to be consistent with a lumbar radiculopathy or cervical myelopathy -please call with any concerns or questions  Judith Part, MD 03/26/2020 10:07 PM [facility]

## 2020-03-15 ENCOUNTER — Inpatient Hospital Stay (HOSPITAL_COMMUNITY): Payer: Medicare Other

## 2020-03-15 DIAGNOSIS — E1022 Type 1 diabetes mellitus with diabetic chronic kidney disease: Secondary | ICD-10-CM

## 2020-03-15 DIAGNOSIS — R531 Weakness: Secondary | ICD-10-CM

## 2020-03-15 DIAGNOSIS — R7401 Elevation of levels of liver transaminase levels: Secondary | ICD-10-CM

## 2020-03-15 DIAGNOSIS — N183 Chronic kidney disease, stage 3 unspecified: Secondary | ICD-10-CM

## 2020-03-15 LAB — CBC
HCT: 36.8 % — ABNORMAL LOW (ref 39.0–52.0)
Hemoglobin: 11.7 g/dL — ABNORMAL LOW (ref 13.0–17.0)
MCH: 27.9 pg (ref 26.0–34.0)
MCHC: 31.8 g/dL (ref 30.0–36.0)
MCV: 87.8 fL (ref 80.0–100.0)
Platelets: 344 10*3/uL (ref 150–400)
RBC: 4.19 MIL/uL — ABNORMAL LOW (ref 4.22–5.81)
RDW: 13.9 % (ref 11.5–15.5)
WBC: 8.3 10*3/uL (ref 4.0–10.5)
nRBC: 0 % (ref 0.0–0.2)

## 2020-03-15 LAB — COMPREHENSIVE METABOLIC PANEL
ALT: 239 U/L — ABNORMAL HIGH (ref 0–44)
AST: 111 U/L — ABNORMAL HIGH (ref 15–41)
Albumin: 3.2 g/dL — ABNORMAL LOW (ref 3.5–5.0)
Alkaline Phosphatase: 172 U/L — ABNORMAL HIGH (ref 38–126)
Anion gap: 14 (ref 5–15)
BUN: 35 mg/dL — ABNORMAL HIGH (ref 8–23)
CO2: 22 mmol/L (ref 22–32)
Calcium: 9.3 mg/dL (ref 8.9–10.3)
Chloride: 104 mmol/L (ref 98–111)
Creatinine, Ser: 1.48 mg/dL — ABNORMAL HIGH (ref 0.61–1.24)
GFR, Estimated: 51 mL/min — ABNORMAL LOW (ref >60)
Glucose, Bld: 137 mg/dL — ABNORMAL HIGH (ref 70–99)
Potassium: 3.5 mmol/L (ref 3.5–5.1)
Sodium: 140 mmol/L (ref 135–145)
Total Bilirubin: 0.8 mg/dL (ref 0.3–1.2)
Total Protein: 6.8 g/dL (ref 6.5–8.1)

## 2020-03-15 LAB — VITAMIN B12: Vitamin B-12: 1421 pg/mL — ABNORMAL HIGH (ref 180–914)

## 2020-03-15 LAB — GLUCOSE, CAPILLARY
Glucose-Capillary: 117 mg/dL — ABNORMAL HIGH (ref 70–99)
Glucose-Capillary: 152 mg/dL — ABNORMAL HIGH (ref 70–99)
Glucose-Capillary: 173 mg/dL — ABNORMAL HIGH (ref 70–99)

## 2020-03-15 LAB — HEPATITIS PANEL, ACUTE
HCV Ab: NONREACTIVE
Hep A IgM: NONREACTIVE
Hep B C IgM: NONREACTIVE
Hepatitis B Surface Ag: NONREACTIVE

## 2020-03-15 LAB — TSH: TSH: 2.699 u[IU]/mL (ref 0.350–4.500)

## 2020-03-15 LAB — T4, FREE: Free T4: 1.63 ng/dL — ABNORMAL HIGH (ref 0.61–1.12)

## 2020-03-15 LAB — CK: Total CK: 49 U/L (ref 49–397)

## 2020-03-15 MED ORDER — POLYETHYLENE GLYCOL 3350 17 G PO PACK
17.0000 g | PACK | Freq: Every day | ORAL | Status: DC
  Administered 2020-03-17 – 2020-03-20 (×3): 17 g via ORAL
  Filled 2020-03-15 (×5): qty 1

## 2020-03-15 MED ORDER — LIDOCAINE HCL URETHRAL/MUCOSAL 2 % EX GEL
1.0000 "application " | Freq: Once | CUTANEOUS | Status: DC
  Filled 2020-03-15: qty 11

## 2020-03-15 NOTE — Consult Note (Signed)
I have been asked to see the patient by Dr. Maryland Pink, for evaluation and management of acute urinary retention and foley catheter placement.  History of present illness: 69 year old man who underwent lumbar laminectomy L3-L5 and was readmitted for generalized weakness and numbness in hands and feet.  Patient performs CIC to 2-3 times a day and is under the urologic care of [name] in [facility].  Patient is unsure if he performs CIC due to bladder outlet obstruction or atonic bladder.  He does void in between catheterizations.  He was able to void approximately 300 cc overnight however given the amount of urine on bladder scan this may be overflow.  Patient was found to have over 1000 mL on bladder scan and attempts at placing Foley catheter were unsuccessful.  Urology was consulted for Foley catheter placement.  Per chart review, patient has a history of localized prostate cancer, CKD stage III, and is status post a right nephrectomy for oncocytoma.   Review of systems: A 12 point comprehensive review of systems was obtained and is negative unless otherwise stated in the history of present illness.  Patient Active Problem List   Diagnosis Date Noted  . Generalized weakness 03/18/2020  . Transaminitis 03/11/2020  . Sacral decubitus ulcer, stage II ([place]) 04/01/2020  . Abnormal urinalysis   . Postoperative pain   . Stage 3b chronic kidney disease ([place])   . Drug induced constipation   . Hypokalemia   . Labile blood pressure   . Chronic diastolic congestive heart failure ([place])   . Controlled type 2 diabetes mellitus with hyperglycemia, without long-term current use of insulin ([place])   . S/p nephrectomy   . Left hip pain   . Lumbar radiculopathy 02/13/2020  . Spondylolisthesis of lumbar region 02/10/2020  . Hypertension   . Heart failure ([place])   . DM (diabetes mellitus) ([place])   . Benign hypertension with chronic kidney disease, stage III ([place]) 04/11/2018  . Renal oncocytoma of right  kidney 01/29/2018  . Hyperkalemia 01/17/2018  . Metabolic bone disease 29/92/4268  . Vitamin D deficiency 01/17/2018  . Abnormal nuclear stress test 11/03/2017  . Chronic systolic congestive heart failure ([place]) 10/26/2017  . Discoloration of tongue 09/25/2017  . Obesity 10/18/2014  . COPD GOLD II 10/17/2014  . Essential hypertension 09/11/2014  . Dyspnea 09/05/2014  . CKD stage 3 due to type 1 diabetes mellitus ([place]) 08/28/2014  . Coronary artery disease involving native coronary artery of native heart with angina pectoris ([place]) 08/27/2014  . Hyperlipidemia 08/27/2014  . Hypertensive heart disease with congestive heart failure ([place]) 08/27/2014  . ICD (implantable cardioverter-defibrillator) in place 08/27/2014  . On amiodarone therapy 08/27/2014  . Systolic CHF, chronic ([place]) 08/27/2014  . Typical atrial flutter ([place]) 08/27/2014  . Ventricular tachyarrhythmia ([place]) 08/27/2014  . CAD in native artery 08/27/2014  . Atrial arrhythmia 02/15/2014  . S/P ICD (internal cardiac defibrillator) procedure 02/15/2014  . Cardiac dysrhythmia 02/12/2014  . NSTEMI (non-ST elevated myocardial infarction) ([place]) 02/12/2014    No current facility-administered medications on file prior to encounter.   Current Outpatient Medications on File Prior to Encounter  Medication Sig Dispense Refill  . acetaminophen (TYLENOL) 500 MG tablet Take 500 mg by mouth 2 (two) times daily.     Marland Kitchen amiodarone (PACERONE) 200 MG tablet Take 1 tablet (200 mg total) by mouth 2 (two) times daily. 60 tablet 0  . Ascorbic Acid (VITAMIN C) 1000 MG tablet Take 1 tablet (1,000 mg total) by mouth 2 (two)  times daily. 60 tablet 0  . atorvastatin (LIPITOR) 40 MG tablet TAKE 1 TABLET BY MOUTH ONCE DAILY. *NEEDS APPOINTMENT FOR REFILLS* (Patient taking differently: Take 40 mg by mouth at bedtime.) 90 tablet 2  . b complex vitamins capsule Take 1 capsule by mouth daily. 30 capsule 0  . ciprofloxacin (CIPRO) 500 MG tablet Take 500 mg by  mouth 2 (two) times daily.    . clopidogrel (PLAVIX) 75 MG tablet Take 1 tablet (75 mg total) by mouth daily. 90 tablet 1  . Coenzyme Q10 (COQ10) 100 MG CAPS Take 100 mg by mouth in the morning and at bedtime.     . docusate sodium (COLACE) 100 MG capsule Take 1 capsule (100 mg total) by mouth 2 (two) times daily. 10 capsule 0  . ergocalciferol (VITAMIN D2) 1.25 MG (50000 UT) capsule Take 1 capsule (50,000 Units total) by mouth every Monday. 5 capsule 0  . furosemide (LASIX) 40 MG tablet TAKE 1 TABLET BY MOUTH TWICE(2) DAILY (Patient taking differently: Take 40 mg by mouth 2 (two) times daily.) 180 tablet 1  . insulin detemir (LEVEMIR) 100 UNIT/ML FlexPen Inject 5 Units into the skin daily. (Patient taking differently: Inject 5 Units into the skin at bedtime.) 15 mL 11  . isosorbide mononitrate (IMDUR) 30 MG 24 hr tablet Take 1 tablet (30 mg total) by mouth every morning. (Hold if blood pressure under 100 sbp) 30 tablet 0  . levothyroxine (SYNTHROID) 88 MCG tablet Take 1 tablet (88 mcg total) by mouth daily before breakfast. 30 tablet 0  . LORazepam (ATIVAN) 0.5 MG tablet Take 0.5 mg by mouth 2 (two) times daily as needed for anxiety.    . melatonin 3 MG TABS tablet Take 2 tablets (6 mg total) by mouth at bedtime. 30 tablet 0  . Multiple Vitamin (MULTIVITAMIN) capsule Take 1 capsule by mouth daily.    . nitroGLYCERIN (NITROSTAT) 0.4 MG SL tablet Place 1 tablet (0.4 mg total) under the tongue every 5 (five) minutes as needed for chest pain. 25 tablet 5  . NON FORMULARY Apply 1 application topically See admin instructions. Absorbine  Veterinary Horse Liniment Gel- Apply to affected area of sacrum three times a day for pain    . NYAMYC powder Apply 1 application topically daily as needed for irritation.    Marland Kitchen omega-3 acid ethyl esters (LOVAZA) 1 g capsule Take 2 capsules (2 g total) by mouth in the morning and at bedtime. (Patient taking differently: Take 1 g by mouth in the morning and at bedtime.) 120  capsule 3  . oxyCODONE (OXY IR/ROXICODONE) 5 MG immediate release tablet Take 1-2 tablets (5-10 mg total) by mouth every 4 (four) hours as needed for severe pain. 30 tablet 0  . potassium chloride (KLOR-CON) 10 MEQ tablet Take 1 tablet (10 mEq total) by mouth daily. 30 tablet 0  . sacubitril-valsartan (ENTRESTO) 49-51 MG Take 1 tablet by mouth 2 (two) times daily. 60 tablet 3  . senna (SENOKOT) 8.6 MG tablet Take 2 tablets by mouth daily as needed for constipation.    . sitaGLIPtin (JANUVIA) 25 MG tablet Take 25 mg by mouth daily.     . tamsulosin (FLOMAX) 0.4 MG CAPS capsule Take 1 capsule (0.4 mg total) by mouth daily. (Patient taking differently: Take 0.4 mg by mouth at bedtime.) 30 capsule 0  . Zinc 50 MG TABS Take 50 mg by mouth daily.     . cyclobenzaprine (FLEXERIL) 10 MG tablet Take 1 tablet (10 mg total)  by mouth 3 (three) times daily as needed for muscle spasms. 60 tablet 0  . lidocaine (LIDODERM) 5 % Place 1 patch onto the skin daily. Remove & Discard patch within 12 hours or as directed by MD (Patient not taking: No sig reported) 30 patch 0  . metoprolol succinate (TOPROL XL) 25 MG 24 hr tablet Take 1 tablet (25 mg total) by mouth daily. 90 tablet 3  . polyethylene glycol (MIRALAX / GLYCOLAX) 17 g packet Take 17 g by mouth 2 (two) times daily. (Patient not taking: No sig reported) 14 each 0    Past Medical History:  Diagnosis Date  . Abnormal nuclear stress test 11/03/2017   Added automatically from request for surgery [id number]  . Benign hypertension with chronic kidney disease, stage III ([place]) 04/11/2018  . CAD in native artery 08/27/2014  . Chronic systolic congestive heart failure ([place]) 10/26/2017  . CKD stage 3 due to type 1 diabetes mellitus ([place]) 08/28/2014   Overview:  Cr 1.3 at discharge 08/21/14  . COPD GOLD II 10/17/2014   Followed in Pulmonary clinic/ [facility]/ [facility]  - PFTs   10/17/2014 FEV1  2.09 ( 53%) ratio 62 no sign better p B2 and dlco 63% and corrects to 85%  -  10/17/2014 p extensive coaching HFA effectiveness =  90%    > try stiolto    . DM (diabetes mellitus) ([place])   . Dyspnea 09/05/2014   Followed in Pulmonary clinic/ [facility]/ [facility] - 09/05/2014  Walked RA x 3 laps @ 185 ft each stopped due to end of study, nl [place], no desat   - pfts 10/17/14 dlco 63% on Amiodarone > may benefit from 6 month f/u but defer to Cards    . Essential hypertension 09/11/2014   Changed acei to ARB  09/05/14 due to pseudowheeze> improved 10/17/14    . Hyperlipidemia   . Hypertension   . Hypertensive heart disease with congestive heart failure ([place]) 08/27/2014  . ICD (implantable cardioverter-defibrillator) in place 08/27/2014  . Metabolic bone disease 16/04/930  . NSTEMI (non-ST elevated myocardial infarction) ([place]) 02/12/2014  . Obesity 3/55/7322   Complicated by dm/ hbp and low erv on pfts 10/17/14    . On amiodarone therapy 08/27/2014  . Renal oncocytoma of right kidney 01/29/2018  . S/P ICD (internal cardiac defibrillator) procedure 02/15/2014   followed by [facility] EP  . Typical atrial flutter ([place]) 08/27/2014  . Ventricular tachyarrhythmia ([place]) 08/27/2014  . Vitamin D deficiency 01/17/2018    Past Surgical History:  Procedure Laterality Date  . CORONARY ARTERY BYPASS GRAFT  2008  . EYE SURGERY    . ICD IMPLANT    . MOHS SURGERY     Right Arm  . NEPHRECTOMY Right     Social History   Tobacco Use  . Smoking status: Former Smoker    Packs/day: 1.00    Years: 38.00    Pack years: 38.00    Types: Cigarettes    Quit date: 04/04/2006    Years since quitting: 13.9  . Smokeless tobacco: Never Used  Vaping Use  . Vaping Use: Never used  Substance Use Topics  . Alcohol use: No    Alcohol/week: 0.0 standard drinks    Comment: Rarely  . Drug use: No    Family History  Problem Relation Age of Onset  . Heart disease Mother   . Breast cancer Mother   . Heart disease Father   . Cancer Daughter   . COPD Brother  PE: Vitals:   03/15/20 0145  03/15/20 0145 03/15/20 0225 03/15/20 0751  BP: (!) 157/62  (!) 166/75 (!) 158/66  Pulse: (!) 59  73 71  Resp: 16  17 18   Temp:  (!) 97.4 F (36.3 C) 97.7 F (36.5 C) (!) 97.4 F (36.3 C)  TempSrc:  Oral Oral Oral  SpO2: 100%  99% 100%  Weight:      Height:       Patient appears to be in no acute distress  patient is alert and oriented x3 Atraumatic normocephalic head No cervical or supraclavicular lymphadenopathy appreciated No increased work of breathing, no audible wheezes/rhonchi Abdomen is soft, nontender, nondistended,  No identifiable skin lesions  Recent Labs    04/02/2020 1320 03/15/20 0303  WBC 9.8 8.3  HGB 11.4* 11.7*  HCT 36.2* 36.8*   Recent Labs    03/11/2020 1320 03/15/20 0303  NA 137 140  K 3.8 3.5  CL 103 104  CO2 23 22  GLUCOSE 134* 137*  BUN 40* 35*  CREATININE 1.80* 1.48*  CALCIUM 9.2 9.3   No results for input(s): LABPT, INR in the last 72 hours. No results for input(s): LABURIN in the last 72 hours. Results for orders placed or performed during the hospital encounter of 03/12/2020  Resp Panel by RT-PCR (Flu A&B, Covid) Nasopharyngeal Swab     Status: None   Collection Time: 03/23/2020  8:37 PM   Specimen: Nasopharyngeal Swab; Nasopharyngeal(NP) swabs in vial transport medium  Result Value Ref Range Status   SARS Coronavirus 2 by RT PCR NEGATIVE NEGATIVE Final    Comment: (NOTE) SARS-CoV-2 target nucleic acids are NOT DETECTED.  The SARS-CoV-2 RNA is generally detectable in upper respiratory specimens during the acute phase of infection. The lowest concentration of SARS-CoV-2 viral copies this assay can detect is 138 copies/mL. A negative result does not preclude SARS-Cov-2 infection and should not be used as the sole basis for treatment or other patient management decisions. A negative result may occur with  improper specimen collection/handling, submission of specimen other than nasopharyngeal swab, presence of viral mutation(s) within  the areas targeted by this assay, and inadequate number of viral copies(<138 copies/mL). A negative result must be combined with clinical observations, patient history, and epidemiological information. The expected result is Negative.  Fact Sheet for Patients:  EntrepreneurPulse.com.au  Fact Sheet for Healthcare Providers:  IncredibleEmployment.be  This test is no t yet approved or cleared by the [place] FDA and  has been authorized for detection and/or diagnosis of SARS-CoV-2 by FDA under an Emergency Use Authorization (EUA). This EUA will remain  in effect (meaning this test can be used) for the duration of the COVID-19 declaration under Section 564(b)(1) of the Act, 21 U.S.C.section 360bbb-3(b)(1), unless the authorization is terminated  or revoked sooner.       Influenza A by PCR NEGATIVE NEGATIVE Final   Influenza B by PCR NEGATIVE NEGATIVE Final    Comment: (NOTE) The Xpert Xpress SARS-CoV-2/FLU/RSV plus assay is intended as an aid in the diagnosis of influenza from Nasopharyngeal swab specimens and should not be used as a sole basis for treatment. Nasal washings and aspirates are unacceptable for Xpert Xpress SARS-CoV-2/FLU/RSV testing.  Fact Sheet for Patients: EntrepreneurPulse.com.au  Fact Sheet for Healthcare Providers: IncredibleEmployment.be  This test is not yet approved or cleared by the [place] FDA and has been authorized for detection and/or diagnosis of SARS-CoV-2 by FDA under an Emergency Use Authorization (EUA). This EUA will remain in  effect (meaning this test can be used) for the duration of the COVID-19 declaration under Section 564(b)(1) of the Act, 21 U.S.C. section 360bbb-3(b)(1), unless the authorization is terminated or revoked.  Performed at [facility], [place] [address], [place], [place] [address]       Imp: 69 year old man with multiple medical  problems including CAD, CKD stage III, localized prostate cancer, history of right renal oncocytoma status post right nephrectomy with recent laminectomy and spinal fusion of L3-L5 in November now readmitted for weakness from inpatient rehab with urinary retention and difficulty placing foley.    Recommendations: 1.  Urinary retention: -Urology placed 20 French coud Foley catheter without difficulty and immediate drainage of 1200 cc of clear yellow urine returned -Continue Foley catheter until patient has follow-up with his urologist [name] -If patient will be here longer than 2 to 3 days may consider removing Foley catheter and resuming CIC but will need to be done with a 20 [place] coude catheter -Please note catheter placement was not difficult but there is pressure at the prostate that requires gentle pressure  Thank you for involving me in this patient's care. Please page with any further questions or concerns. Belkis Norbeck D Cammi Consalvo

## 2020-03-15 NOTE — Consult Note (Addendum)
NEUROLOGY CONSULT  Reason for Consult: weakness Referring Physician: Dr   CC: I woke up on Thr with numbness in both feet and hands  HPI: James [name]. is an 69 y.o. male who has a complex PMH, listed below, but most importantly just had surgery in Nov for lumbar laminectomy & fusion of L3-L5 and has been doing inpatient rehab and had just d/c from there to home. He was treated for an UTI during his rehab stay. On Thr morning 12/9, the pt noted numbness bilat feet to ankles and bilat fingers up to wrists. He has no longer been able to stand or walk as he can't feel the floor beneath him. He does have some neuropathy at baseline, but intact reflexes noted on previous out pt neurology visit one month ago. The pt is now areflexic. Current symptoms are c/w GBS.   Past Medical History Past Medical History:  Diagnosis Date  . Abnormal nuclear stress test 11/03/2017   Added automatically from request for surgery [phone]  . Benign hypertension with chronic kidney disease, stage III ([place]) 04/11/2018  . CAD in native artery 08/27/2014  . Chronic systolic congestive heart failure ([place]) 10/26/2017  . CKD stage 3 due to type 1 diabetes mellitus ([place]) 08/28/2014   Overview:  Cr 1.3 at discharge 08/21/14  . COPD GOLD II 10/17/2014   Followed in Pulmonary clinic/ [facility]/ [facility]  - PFTs   10/17/2014 FEV1  2.09 ( 53%) ratio 62 no sign better p B2 and dlco 63% and corrects to 85%  - 10/17/2014 p extensive coaching HFA effectiveness =  90%    > try stiolto    . DM (diabetes mellitus) ([place])   . Dyspnea 09/05/2014   Followed in Pulmonary clinic/ [facility]/ [place] - 09/05/2014  Walked RA x 3 laps @ 185 ft each stopped due to end of study, nl pace, no desat   - pfts 10/17/14 dlco 63% on Amiodarone > may benefit from 6 month f/u but defer to Cards    . Essential hypertension 09/11/2014   Changed acei to ARB  09/05/14 due to pseudowheeze> improved 10/17/14    . Hyperlipidemia   . Hypertension   . Hypertensive heart  disease with congestive heart failure ([place]) 08/27/2014  . ICD (implantable cardioverter-defibrillator) in place 08/27/2014  . Metabolic bone disease 27/74/1287  . NSTEMI (non-ST elevated myocardial infarction) ([place]) 02/12/2014  . Obesity 8/67/6720   Complicated by dm/ hbp and low erv on pfts 10/17/14    . On amiodarone therapy 08/27/2014  . Renal oncocytoma of right kidney 01/29/2018  . S/P ICD (internal cardiac defibrillator) procedure 02/15/2014   followed by [facility] EP  . Typical atrial flutter ([place]) 08/27/2014  . Ventricular tachyarrhythmia ([place]) 08/27/2014  . Vitamin D deficiency 01/17/2018    Past Surgical History Past Surgical History:  Procedure Laterality Date  . CORONARY ARTERY BYPASS GRAFT  2008  . EYE SURGERY    . ICD IMPLANT    . MOHS SURGERY     Right Arm  . NEPHRECTOMY Right     Family History Family History  Problem Relation Age of Onset  . Heart disease Mother   . Breast cancer Mother   . Heart disease Father   . Cancer Daughter   . COPD Brother     Social History    reports that he quit smoking about 13 years ago. His smoking use included cigarettes. He has a 38.00 pack-year smoking history. He has never used  smokeless tobacco. He reports that he does not drink alcohol and does not use drugs.  Allergies Allergies  Allergen Reactions  . Baclofen Other (See Comments)    Mental status changes- made patient go "out of his mind"- PATIENT HAS ONLY 1 KIDNEY!!  . Nsaids Other (See Comments)    Can have ONLY Tylenol- due to having only 1 kidney  . Codeine Rash    Home Medications Medications Prior to Admission  Medication Sig Dispense Refill  . acetaminophen (TYLENOL) 500 MG tablet Take 500 mg by mouth 2 (two) times daily.     Marland Kitchen amiodarone (PACERONE) 200 MG tablet Take 1 tablet (200 mg total) by mouth 2 (two) times daily. 60 tablet 0  . Ascorbic Acid (VITAMIN C) 1000 MG tablet Take 1 tablet (1,000 mg total) by mouth 2 (two) times daily. 60 tablet 0  .  atorvastatin (LIPITOR) 40 MG tablet TAKE 1 TABLET BY MOUTH ONCE DAILY. *NEEDS APPOINTMENT FOR REFILLS* (Patient taking differently: Take 40 mg by mouth at bedtime.) 90 tablet 2  . b complex vitamins capsule Take 1 capsule by mouth daily. 30 capsule 0  . ciprofloxacin (CIPRO) 500 MG tablet Take 500 mg by mouth 2 (two) times daily.    . clopidogrel (PLAVIX) 75 MG tablet Take 1 tablet (75 mg total) by mouth daily. 90 tablet 1  . Coenzyme Q10 (COQ10) 100 MG CAPS Take 100 mg by mouth in the morning and at bedtime.     . docusate sodium (COLACE) 100 MG capsule Take 1 capsule (100 mg total) by mouth 2 (two) times daily. 10 capsule 0  . ergocalciferol (VITAMIN D2) 1.25 MG (50000 UT) capsule Take 1 capsule (50,000 Units total) by mouth every Monday. 5 capsule 0  . furosemide (LASIX) 40 MG tablet TAKE 1 TABLET BY MOUTH TWICE(2) DAILY (Patient taking differently: Take 40 mg by mouth 2 (two) times daily.) 180 tablet 1  . insulin detemir (LEVEMIR) 100 UNIT/ML FlexPen Inject 5 Units into the skin daily. (Patient taking differently: Inject 5 Units into the skin at bedtime.) 15 mL 11  . isosorbide mononitrate (IMDUR) 30 MG 24 hr tablet Take 1 tablet (30 mg total) by mouth every morning. (Hold if blood pressure under 100 sbp) 30 tablet 0  . levothyroxine (SYNTHROID) 88 MCG tablet Take 1 tablet (88 mcg total) by mouth daily before breakfast. 30 tablet 0  . LORazepam (ATIVAN) 0.5 MG tablet Take 0.5 mg by mouth 2 (two) times daily as needed for anxiety.    . melatonin 3 MG TABS tablet Take 2 tablets (6 mg total) by mouth at bedtime. 30 tablet 0  . Multiple Vitamin (MULTIVITAMIN) capsule Take 1 capsule by mouth daily.    . nitroGLYCERIN (NITROSTAT) 0.4 MG SL tablet Place 1 tablet (0.4 mg total) under the tongue every 5 (five) minutes as needed for chest pain. 25 tablet 5  . NON FORMULARY Apply 1 application topically See admin instructions. Absorbine  Veterinary Horse Liniment Gel- Apply to affected area of sacrum three  times a day for pain    . NYAMYC powder Apply 1 application topically daily as needed for irritation.    Marland Kitchen omega-3 acid ethyl esters (LOVAZA) 1 g capsule Take 2 capsules (2 g total) by mouth in the morning and at bedtime. (Patient taking differently: Take 1 g by mouth in the morning and at bedtime.) 120 capsule 3  . oxyCODONE (OXY IR/ROXICODONE) 5 MG immediate release tablet Take 1-2 tablets (5-10 mg total) by mouth every  4 (four) hours as needed for severe pain. 30 tablet 0  . potassium chloride (KLOR-CON) 10 MEQ tablet Take 1 tablet (10 mEq total) by mouth daily. 30 tablet 0  . sacubitril-valsartan (ENTRESTO) 49-51 MG Take 1 tablet by mouth 2 (two) times daily. 60 tablet 3  . senna (SENOKOT) 8.6 MG tablet Take 2 tablets by mouth daily as needed for constipation.    . sitaGLIPtin (JANUVIA) 25 MG tablet Take 25 mg by mouth daily.     . tamsulosin (FLOMAX) 0.4 MG CAPS capsule Take 1 capsule (0.4 mg total) by mouth daily. (Patient taking differently: Take 0.4 mg by mouth at bedtime.) 30 capsule 0  . Zinc 50 MG TABS Take 50 mg by mouth daily.     . cyclobenzaprine (FLEXERIL) 10 MG tablet Take 1 tablet (10 mg total) by mouth 3 (three) times daily as needed for muscle spasms. 60 tablet 0  . lidocaine (LIDODERM) 5 % Place 1 patch onto the skin daily. Remove & Discard patch within 12 hours or as directed by MD (Patient not taking: No sig reported) 30 patch 0  . metoprolol succinate (TOPROL XL) 25 MG 24 hr tablet Take 1 tablet (25 mg total) by mouth daily. 90 tablet 3  . polyethylene glycol (MIRALAX / GLYCOLAX) 17 g packet Take 17 g by mouth 2 (two) times daily. (Patient not taking: No sig reported) 14 each 0    Hospital Medications . amiodarone  200 mg Oral Daily  . vitamin C  1,000 mg Oral BID  . clopidogrel  75 mg Oral Daily  . docusate sodium  100 mg Oral BID  . enoxaparin (LOVENOX) injection  40 mg Subcutaneous QHS  . insulin aspart  0-9 Units Subcutaneous TID WC  . insulin glargine  5 Units  Subcutaneous QHS  . levothyroxine  88 mcg Oral QAC breakfast  . lidocaine  1 patch Transdermal Q24H  . lidocaine  1 application Urethral Once  . melatonin  6 mg Oral QHS  . metoprolol succinate  12.5 mg Oral Daily  . multivitamin with minerals  1 tablet Oral Daily  . polyethylene glycol  17 g Oral Daily  . tamsulosin  0.4 mg Oral QHS  . [START ON 03/16/2020] Vitamin D (Ergocalciferol)  50,000 Units Oral Q Mon     ROS: History obtained from pt and wife  General ROS: negative for - chills, fatigue, fever, night sweats, weight gain or weight loss Psychological ROS: negative for - behavioral disorder, hallucinations, memory difficulties, mood swings or suicidal ideation Ophthalmic ROS: negative for - blurry vision, double vision, eye pain or loss of vision ENT ROS: negative for - epistaxis, nasal discharge, oral lesions, sore throat, tinnitus or vertigo Allergy and Immunology ROS: negative for - hives or itchy/watery eyes Hematological and Lymphatic ROS: negative for - bleeding problems, bruising or swollen lymph nodes Endocrine ROS: negative for - galactorrhea, hair pattern changes, polydipsia/polyuria or temperature intolerance Respiratory ROS: negative for - cough, hemoptysis, shortness of breath or wheezing Cardiovascular ROS: negative for - chest pain, dyspnea on exertion, edema or irregular heartbeat Gastrointestinal ROS: negative for - abdominal pain, diarrhea, hematemesis, nausea/vomiting or stool incontinence Genito-Urinary ROS: positive for dysuria, retention, Prostate cancer and need for self cath at home.  negative for -  hematuria, incontinence or urinary frequency/urgency Musculoskeletal ROS: negative for - joint swelling or muscular weakness Neurological ROS: as noted in HPI Dermatological ROS: negative for rash and skin lesion changes   Physical Examination:  Vitals:   03/15/20  0225 03/15/20 0751 03/15/20 1128 03/15/20 1536  BP: (!) 166/75 (!) 158/66 137/60 135/61   Pulse: 73 71 72 60  Resp: 17 18 18 18   Temp: 97.7 F (36.5 C) (!) 97.4 F (36.3 C) 97.8 F (36.6 C) 97.8 F (36.6 C)  TempSrc: Oral Oral Oral Oral  SpO2: 99% 100% 100% 99%  Weight:      Height:        General - debilitated, mild-mod distress Heart - Regular rate and rhythm - no murmer Lungs - Clear to auscultation Abdomen - Soft - non tender Extremities - Distal pulses intact - no edema Skin - Warm and dry  Neurologic Examination:   Mental Status:  Alert, oriented, thought content appropriate. Speech without evidence of dysarthria or aphasia. Able to follow 3 step commands without difficulty.  Cranial Nerves:  II-bilateral visual fields intact III/IV/VI-Pupils were equal and reacted. Extraocular movements were full.  V/VII-no facial numbness and no facial weakness.  VIII-hearing normal.  X-normal speech and symmetrical palatal movement.  XII-midline tongue extension  Motor: Bilat UE: delt 4/5, biceps 4/5, triceps 3/5, wrist ext 2/4, finger abd 1/4,  Bilat LE: hip flex 3/5, hip ext 4/5, knee felx 4/5, knee ext 4/5, ankle flex 2/5, ankle ext 2/5 Tone and bulk:normal tone throughout; no atrophy noted Sensory: decreased distally toes to ankle and fingers to wrists. No proprioception. Able to sense vibration in LE, but not in UEs. Cold sensation is also absent in these areas.  Deep Tendon Reflexes: 0/4 throughout Plantars: mute Cerebellar: unable Gait: not tested   LABORATORY STUDIES:  Basic Metabolic Panel: Recent Labs  Lab 03/20/2020 1320 03/15/20 0303  NA 137 140  K 3.8 3.5  CL 103 104  CO2 23 22  GLUCOSE 134* 137*  BUN 40* 35*  CREATININE 1.80* 1.48*  CALCIUM 9.2 9.3    Liver Function Tests: Recent Labs  Lab 03/28/2020 2114 03/15/20 0303  AST 102* 111*  ALT 230* 239*  ALKPHOS 179* 172*  BILITOT 0.7 0.8  PROT 7.0 6.8  ALBUMIN 3.1* 3.2*   No results for input(s): LIPASE, AMYLASE in the last 168 hours. Recent Labs  Lab 03/12/2020 2114  AMMONIA 11     CBC: Recent Labs  Lab 03/18/2020 1320 03/15/20 0303  WBC 9.8 8.3  HGB 11.4* 11.7*  HCT 36.2* 36.8*  MCV 89.4 87.8  PLT 336 344    Cardiac Enzymes: Recent Labs  Lab 03/15/20 0303  CKTOTAL 49    BNP: Invalid input(s): POCBNP  CBG: Recent Labs  Lab 03/11/2020 1800 03/15/20 0225 03/15/20 1243  GLUCAP 127* 117* 152*    Microbiology:   Coagulation Studies: No results for input(s): LABPROT, INR in the last 72 hours.  Urinalysis:  Recent Labs  Lab 04/01/2020 1609  COLORURINE YELLOW  LABSPEC 1.008  PHURINE 5.0  GLUCOSEU NEGATIVE  HGBUR NEGATIVE  BILIRUBINUR NEGATIVE  KETONESUR NEGATIVE  PROTEINUR NEGATIVE  NITRITE NEGATIVE  LEUKOCYTESUR NEGATIVE    Lipid Panel:  No results found for: CHOL, TRIG, HDL, CHOLHDL, VLDL, LDLCALC  HgbA1C:  Lab Results  Component Value Date   HGBA1C 5.9 (H) 02/06/2020    Urine Drug Screen:  No results found for: LABOPIA, COCAINSCRNUR, LABBENZ, AMPHETMU, THCU, LABBARB   Alcohol Level:  No results for input(s): ETH in the last 168 hours.  Miscellaneous labs:  EKG  EKG  IMAGING: CT Head Wo Contrast  Result Date: 04/01/2020 CLINICAL DATA:  Awoke with bilateral leg weakness and numbness. EXAM: CT HEAD WITHOUT CONTRAST TECHNIQUE:  Contiguous axial images were obtained from the base of the skull through the vertex without intravenous contrast. COMPARISON:  Head CT 06/25/2019 FINDINGS: Brain: Brain volume is normal for age. No intracranial hemorrhage, mass effect, or midline shift. No hydrocephalus. The basilar cisterns are patent. No evidence of territorial infarct or acute ischemia. No extra-axial or intracranial fluid collection. Vascular: Atherosclerosis of skullbase vasculature without hyperdense vessel or abnormal calcification. Skull: No fracture or focal lesion. Sinuses/Orbits: No acute findings. Bilateral cataract resection. Chronic opacification of right mastoid air cells with slight improvement from prior exam. Chronic  opacification of lower left mastoid air cells. Other: None. IMPRESSION: 1. No acute intracranial abnormality. 2. Chronic opacification of right mastoid air cells with slight improvement from prior exam. Chronic opacification of lower left mastoid air cells. Electronically Signed   By: Keith Rake M.D.   On: 03/17/2020 20:38   CT L-SPINE NO CHARGE  Result Date: 03/26/2020 CLINICAL DATA:  Back pain x6 weeks. Cauda carina syndrome. Unable to walk. Recent laminectomy. EXAM: CT ABDOMEN AND PELVIS WITHOUT CONTRAST CT LUMBAR SPINE WITHOUT CONTRAST TECHNIQUE: Multidetector CT imaging of the abdomen and pelvis was performed following the standard protocol without IV contrast. Multiplanar CT images of the lumbar spine were reconstructed from contemporary CT of the Abdomen, and Pelvis COMPARISON:  MRI lumbar spine dated 12/13/2019. CT L-spine dated June 28, 2019. FINDINGS: Lower chest: There is atelectasis at the lung bases.The heart size is normal. Hepatobiliary: The liver is normal. There is gallbladder sludge without CT evidence for acute cholecystitis.There is no biliary ductal dilation. Pancreas: Normal contours without ductal dilatation. No peripancreatic fluid collection. Spleen: Unremarkable. Adrenals/Urinary Tract: --Adrenal glands: Unremarkable. --Right kidney/ureter: Patient is status post prior right-sided nephrectomy. --Left kidney/ureter: No hydronephrosis or radiopaque kidney stones. --Urinary bladder: Unremarkable. Stomach/Bowel: --Stomach/Duodenum: No hiatal hernia or other gastric abnormality. Normal duodenal course and caliber. --Small bowel: Unremarkable. --Colon: There is a moderate amount of stool in the rectum. --Appendix: Normal. Vascular/Lymphatic: Atherosclerotic calcification is present within the non-aneurysmal abdominal aorta, without hemodynamically significant stenosis. --No retroperitoneal lymphadenopathy. --No mesenteric lymphadenopathy. --No pelvic or inguinal lymphadenopathy.  Reproductive: There is significant prostatomegaly. Other: No ascites or free air. There are bilateral fat containing inguinal hernias. Musculoskeletal. The patient is status post prior L3 through L5 posterior fusion with interbody spacers at the L3-L4 and L4-L5 levels. The patient is status post prior L3-L4 laminectomy. Evaluation of the lower lumbar spine is limited by streak artifact from the adjacent metallic hardware. There is moderate disc height loss at the L5-S1 level. There is probable moderate right-sided osseous neural foraminal narrowing. There is no acute compression fracture. IMPRESSION: 1. No CT evidence for acute intra-abdominal or intrapelvic pathology. 2. Status post prior right-sided nephrectomy. 3. Moderate amount of stool in the rectum. 4. Marked prostatomegaly. 5. Bilateral fat containing inguinal hernias. 6. The patient is status post prior L3 through L5 posterior fusion and decompression with multilevel interbody spacers. The hardware is intact. There is persistent at least moderate neural foraminal stenosis at the L5 level on the right. There is no acute compression fracture. Evaluation of the spinal canal is significantly limited by metallic streak artifact. Aortic Atherosclerosis (ICD10-I70.0). Electronically Signed   By: Constance Holster M.D.   On: 03/09/2020 20:40   DG Chest Port 1 View  Result Date: 03/19/2020 CLINICAL DATA:  Generalized weakness. Leg weakness. EXAM: PORTABLE CHEST 1 VIEW COMPARISON:  08/28/2019 FINDINGS: Left-sided pacemaker in place. Post median sternotomy. Stable mild cardiomegaly. Unchanged mediastinal contours. Aortic atherosclerosis. Minimal subsegmental  atelectasis or scarring at the left lung base. No new or progressive airspace disease. No pleural fluid or pneumothorax. No pulmonary edema. No acute osseous abnormalities are seen. IMPRESSION: Stable mild cardiomegaly. No acute abnormality. Aortic Atherosclerosis (ICD10-I70.0). Electronically Signed   By:  Keith Rake M.D.   On: 03/09/2020 18:35   CT Renal Stone Study  Result Date: 03/13/2020 CLINICAL DATA:  Back pain x6 weeks. Cauda carina syndrome. Unable to walk. Recent laminectomy. EXAM: CT ABDOMEN AND PELVIS WITHOUT CONTRAST CT LUMBAR SPINE WITHOUT CONTRAST TECHNIQUE: Multidetector CT imaging of the abdomen and pelvis was performed following the standard protocol without IV contrast. Multiplanar CT images of the lumbar spine were reconstructed from contemporary CT of the Abdomen, and Pelvis COMPARISON:  MRI lumbar spine dated 12/13/2019. CT L-spine dated June 28, 2019. FINDINGS: Lower chest: There is atelectasis at the lung bases.The heart size is normal. Hepatobiliary: The liver is normal. There is gallbladder sludge without CT evidence for acute cholecystitis.There is no biliary ductal dilation. Pancreas: Normal contours without ductal dilatation. No peripancreatic fluid collection. Spleen: Unremarkable. Adrenals/Urinary Tract: --Adrenal glands: Unremarkable. --Right kidney/ureter: Patient is status post prior right-sided nephrectomy. --Left kidney/ureter: No hydronephrosis or radiopaque kidney stones. --Urinary bladder: Unremarkable. Stomach/Bowel: --Stomach/Duodenum: No hiatal hernia or other gastric abnormality. Normal duodenal course and caliber. --Small bowel: Unremarkable. --Colon: There is a moderate amount of stool in the rectum. --Appendix: Normal. Vascular/Lymphatic: Atherosclerotic calcification is present within the non-aneurysmal abdominal aorta, without hemodynamically significant stenosis. --No retroperitoneal lymphadenopathy. --No mesenteric lymphadenopathy. --No pelvic or inguinal lymphadenopathy. Reproductive: There is significant prostatomegaly. Other: No ascites or free air. There are bilateral fat containing inguinal hernias. Musculoskeletal. The patient is status post prior L3 through L5 posterior fusion with interbody spacers at the L3-L4 and L4-L5 levels. The patient is status  post prior L3-L4 laminectomy. Evaluation of the lower lumbar spine is limited by streak artifact from the adjacent metallic hardware. There is moderate disc height loss at the L5-S1 level. There is probable moderate right-sided osseous neural foraminal narrowing. There is no acute compression fracture. IMPRESSION: 1. No CT evidence for acute intra-abdominal or intrapelvic pathology. 2. Status post prior right-sided nephrectomy. 3. Moderate amount of stool in the rectum. 4. Marked prostatomegaly. 5. Bilateral fat containing inguinal hernias. 6. The patient is status post prior L3 through L5 posterior fusion and decompression with multilevel interbody spacers. The hardware is intact. There is persistent at least moderate neural foraminal stenosis at the L5 level on the right. There is no acute compression fracture. Evaluation of the spinal canal is significantly limited by metallic streak artifact. Aortic Atherosclerosis (ICD10-I70.0). Electronically Signed   By: Constance Holster M.D.   On: 03/08/2020 20:40     Assessment/Plan: This is 69yr old man who has a complex PMH, listed above, but most importantly just had surgery in Nov for lumbar laminectomy & fusion of L3-L5 and has been doing inpatient rehab and had just d/c from there to home. He was treated for an UTI during his rehab stay. On Thr morning 12/9, the pt noted numbness bilat feet to ankles and bilat fingers up to wrists. He has no longer been able to stand or walk as he can't feel the floor beneath him. He does have some neuropathy at baseline, but intact reflexes noted on previous out pt neurology visit one month ago. The pt is now areflexic. Current symptoms are c/w GBS. Since he had recent L3-L5 surgery, bedside LP would be challenging d/t hardware, thus we have requested flouro  to do this ASAP.   1. Ascending numbness, weakness- exam is most c/w GBS. Pt received Plavix today, but no lovenox. Hope to get LP done today in effort to not delay  tx 2. Recent L3-5 lumbar laminectomy with fusion 02/13/20. He is generally deconditioned and has new decub ulcer. He will need intensive rehab efforts. 3. UTI- seems that this was fully tx with abx as out pt, but pt remains with bladder obstruction d/t prostate cancer and self-caths at home. Do not think this is neurogenic bladder at this time as this was pre-existing with cancer dx. Urology had to place coude' cath. 4. CKD 3 w/rt nephrectomy- monitoring Cr 5. Chronic CHF, CAD s/p CABG 6. DM2 with baseline neuropathy- this numbness is far out of his baseline neuropathy and different distribution.   RECS: Hold Lovenox/Plavix today and tomorrow LP by fluoroscopy given hardware We have discussed treatment options with IVIg vs PLEX Since pt has CHF, PLEX x5 may be more beneficial with less risks for him Will need vas cath placed and consult to renal for PLEX once LP completed Tele NIF/FVC Q8h Close neuro checks Q4  Attending neurologist's note to follow  Desiree Metzger-Cihelka, ARNP-C, ANVP-BC Pager: [phone]  I have seen the patient and reviewed the above note.  He has areflexia, vibration >temperature sensory loss, and significant weakness which combined make an acute neuropathic such as Guillain-Barr process most likely.  Given his hardware, I would favor having an LP done under fluoroscopy.  Given his history of neuropathy, I will get an MRI of his cervical spine given that his baseline neuropathy might be clouding the physical exam.  If no pleocytosis and no findings on MRI C-spine, then I would pursue plasmapheresis.  Roland Rack, MD [facility] [phone]  If 7pm- 7am, please page neurology on call as listed in [place].

## 2020-03-15 NOTE — Evaluation (Signed)
Physical Therapy Evaluation Patient Details Name: James [name]. MRN: [id number] DOB: [birth year] Today's Date: 03/15/2020   History of Present Illness  Pt is a 69 year old male who presented with subacute progressive generalized weakness following his recent surgery in November 2021 in which he received a laminectomy and spinal fusion of L3-5 for lumbar spinal stenosis. He reported feeling like he was doing well initial 2 weeks in inpatient rehab following the surgery but then started to decline prior to discharge. He reported R leg weakness > L, similar to presentation prior to surgery. Since surgery pt has also had a UTI and a new sacral decubitus ulcer. CT of head negative for acute intracranial abnormalities. CT of abdomen negative for acute intra-abdominal or intrapelvic pathology and showing intact L3-5 surgical hardware adn no acute compression fracture. There is persistent at least moderate neural foraminal stenosis at the L5 level on the R, per CT report. PMH: ventricular trachyarrhythmia, typical atrial flutter, s/p ICD, renal oncocytoma of R kidney, obese, SNTEMI, metabolic bone disease, HTN, dyspnea, DM, COPD GOLD II, CKD stage 3, CHF, CAD, and localized prostate CA.  Clinical Impression  Pt presents with condition mentioned above and deficits mentioned below, see PT Problem List. The pt was ambulating after being discharged from CIR recently but then declined over the next two weeks to the point where he was requiring 1 person to help him up > 4 days ago to requiring 3 people to help him up these past few days prior to admission. The pt has 24/7 supervision/assistance available at home. The pt displays significant lower extremity weakness bilaterally, with the R being slightly weaker compared to the L in the majority of muscle groups tested. He also has significantly diminished sensation in his R leg compared to his L. His balance is impaired as he requires min-maxA to maintain static  sitting balance at EOB with UE support with short bouts of only min guard assist. The pt required maxAx2 for all mobility this date, with an inability to stand. Required a maximove total assist lift to return to bed at end of session due to his significant fatigue, indicating endurance deficits. He also had a bout of bowel incontinence with no recognition that he was having a bowel movement during the session. Due to his significant change in functional status recommending d/c to CIR to maximize his independence and safety with functional mobility. If pt declines CIR, then recommend SNF unless family is able to provide the level of physical assistance necessary to care for pt. Will continue to follow acutely.    Follow Up Recommendations CIR;Supervision/Assistance - 24 hour (if pt denies CIR then SNF unless family can provide extensive level of assistance necessary)    Equipment Recommendations  Wheelchair (measurements PT);Wheelchair cushion (measurements PT);Hospital bed;Other (comment) (mechanical lift)    Recommendations for Other Services Rehab consult     Precautions / Restrictions Precautions Precautions: Back;Fall Precaution Comments: Reviewed spinal precuations with pt able to recall 3/3 verbally Required Braces or Orthoses: Spinal Brace Spinal Brace: Lumbar corset;Applied in sitting position Restrictions Weight Bearing Restrictions: No      Mobility  Bed Mobility Overal bed mobility: Needs Assistance Bed Mobility: Rolling;Sidelying to Sit Rolling: Max assist;+2 for physical assistance Sidelying to sit: Max assist;+2 for physical assistance;HOB elevated       General bed mobility comments: HOB elevated throughout for comfort, cuing pt to flex R knee and place R foot on bed surface along to push while reaching R hand  to L bed rail to log roll, with min initiation noted from pt and thus maxAx2 to complete. Cued pt to bring legs off EOB and push up with arms to sit up, but maxAx2  to manage legs and trunk with min activation from pt.    Transfers Overall transfer level: Needs assistance Equipment used: None;Ambulation equipment used Transfers: Lateral/Scoot Transfers          Lateral/Scoot Transfers: Max assist;+2 physical assistance;+2 safety/equipment;From elevated surface General transfer comment: Lateral scooting transfer to L from bed to recliner with bilat knee block and maxAx2 and cues to pull with arms and push with legs to scoot 3x to successfully get into chair. Unable to safely return back to bed due to fatigue thus TA for maximove transfer to bed.  Ambulation/Gait             General Gait Details: Unable  Stairs            Wheelchair Mobility    Modified Rankin (Stroke Patients Only) Modified Rankin (Stroke Patients Only) Pre-Morbid Rankin Score: Moderately severe disability Modified Rankin: Severe disability     Balance Overall balance assessment: Needs assistance Sitting-balance support: Bilateral upper extremity supported;Feet supported Sitting balance-Leahy Scale: Poor Sitting balance - Comments: Bilat UE support with min-maxA and moments of only min guard assist to sit statically EOB. tendency to lean laterally to R and posteriorly, with cues to correct and momentary success.       Standing balance comment: Unable                             Pertinent Vitals/Pain Pain Assessment: 0-10 Pain Score: 8  Pain Location: back; location of ulcer Pain Descriptors / Indicators: Sharp Pain Intervention(s): Limited activity within patient's tolerance;Monitored during session;Repositioned    Home Living Family/patient expects to be discharged to:: Private residence Living Arrangements: Spouse/significant other Available Help at Discharge: Family;Available 24 hours/day (wife works 30 hours a week; daughter able to assist when wife is working) Type of Home: [employer] Access: Stairs to enter Entrance Stairs-Rails:  None Technical brewer of Steps: 1 stoop (5") Home Layout: One level;Laundry or work area in basement (completely finished basement that he has avoided for > 2 years due to stairs) Home Equipment: Environmental consultant - 4 wheels;Toilet riser;Walker - 2 wheels;Bedside commode;Other (comment);Crutches;Shower seat;Tub bench;Grab bars - tub/shower;Grab bars - toilet;Hand held shower head;Transport chair (hurrycane) Additional Comments: 3 in 1, suction grab bars in shower, Rollator    Prior Function Level of Independence: Needs assistance   Gait / Transfers Assistance Needed: used rollator to ambulate prior to surgery. Pt reports being unable to move prior to this admission.  He reports gradual onset of decline in function.  He was ambulatory after leaving CIR, but has not been able to stand or walk in the past 3 days, or so. Was getting up with 1 person > 3 days ago but last several days has needed 3 peopel to get up. Wife confirms.  ADL's / Homemaking Assistance Needed: wife has been assisting with bathing, dressing, tub transfers due to increased back pain and increased weakness  Comments: Immediately prior to surgery, pt had been declining due to pain, unable to stand upright prior to surgery. In March, was walking without AD/AE independently and driving, but progressively declined.     Hand Dominance   Dominant Hand: Right    Extremity/Trunk Assessment   Upper Extremity Assessment Upper Extremity Assessment: Defer to  OT evaluation    Lower Extremity Assessment Lower Extremity Assessment: RLE deficits/detail;LLE deficits/detail RLE Deficits / Details: MMT scores of the following noted: hip flexion 2+, knee extension 3, knee flexion 2+, ankle dorsiflexion 0 RLE Sensation: decreased light touch (sensation to light touch absent at knee and below except lateral ankle intact) RLE Coordination: decreased fine motor;decreased gross motor LLE Deficits / Details: MMT scores of the following noted: hip  flexion 3-, knee extension 2+, knee flexion 2+, ankle dorsiflexion 3- LLE Sensation: decreased light touch (slight numbness at dorsal foot and no sensation dorsal great toe) LLE Coordination: decreased fine motor;decreased gross motor    Cervical / Trunk Assessment Cervical / Trunk Assessment: Kyphotic  Communication   Communication: No difficulties  Cognition Arousal/Alertness: Awake/alert Behavior During Therapy: WFL for tasks assessed/performed Overall Cognitive Status: Within Functional Limits for tasks assessed                                 General Comments: A&Ox4.      General Comments General comments (skin integrity, edema, etc.): Redness and decreased skin integrity at sacrum, brought nurse tech sacral cushion bandage to apply after pericare    Exercises     Assessment/Plan    PT Assessment Patient needs continued PT services  PT Problem List Decreased strength;Decreased activity tolerance;Decreased balance;Decreased mobility;Decreased coordination;Decreased safety awareness;Impaired sensation;Obesity;Decreased skin integrity;Pain       PT Treatment Interventions DME instruction;Gait training;Stair training;Functional mobility training;Therapeutic activities;Therapeutic exercise;Balance training;Neuromuscular re-education;Cognitive remediation;Patient/family education    PT Goals (Current goals can be found in the Care Plan section)  Acute Rehab PT Goals Patient Stated Goal: to improve PT Goal Formulation: With patient/family Time For Goal Achievement: 03/29/20 Potential to Achieve Goals: Fair    Frequency Min 4X/week   Barriers to discharge        Co-evaluation PT/OT/SLP Co-Evaluation/Treatment: Yes Reason for Co-Treatment: Complexity of the patient's impairments (multi-system involvement);For patient/therapist safety;To address functional/ADL transfers PT goals addressed during session: Mobility/safety with mobility;Balance OT goals  addressed during session: ADL's and self-care;Strengthening/ROM       AM-PAC PT "6 Clicks" Mobility  Outcome Measure Help needed turning from your back to your side while in a flat bed without using bedrails?: A Lot Help needed moving from lying on your back to sitting on the side of a flat bed without using bedrails?: A Lot Help needed moving to and from a bed to a chair (including a wheelchair)?: A Lot Help needed standing up from a chair using your arms (e.g., wheelchair or bedside chair)?: Total Help needed to walk in hospital room?: Total Help needed climbing 3-5 steps with a railing? : Total 6 Click Score: 9    End of Session Equipment Utilized During Treatment: Gait belt Activity Tolerance: Patient limited by fatigue Patient left: in bed;with call bell/phone within reach;with family/visitor present;with nursing/sitter in room;Other (comment) (with OT) Nurse Communication: Mobility status;Need for lift equipment PT Visit Diagnosis: Unsteadiness on feet (R26.81);Muscle weakness (generalized) (M62.81);Difficulty in walking, not elsewhere classified (R26.2);Other symptoms and signs involving the nervous system (R29.898);Pain Pain - Right/Left:  (back) Pain - part of body:  (back)    Time: 7829-5621 PT Time Calculation (min) (ACUTE ONLY): 43 min   Charges:   PT Evaluation $PT Eval High Complexity: 1 High          Moishe Spice, PT, DPT Acute Rehabilitation Services  Pager: [phone] Office: [phone]   Orvan Falconer 03/15/2020,  12:05 PM

## 2020-03-15 NOTE — Progress Notes (Signed)
Inpatient Rehab Admissions Coordinator:   I met with patient at bedside to discuss potential CIR admit. Pt. Was recently on CIR and states that he was not happy with the care he received and does not wish to return. States that he would also be interested in other inpatient rehab facilities and [facility] is closest to his home. I will notify case manager.    Clemens Catholic, [place], [place] Admissions Coordinator  [phone] ([place]) [phone] (office)

## 2020-03-15 NOTE — Progress Notes (Addendum)
Patient was bladder scanned. In and out cath was attempted twice., did not meet resistance and was  unable to cath. MD was notified and coude foley was suggested.

## 2020-03-15 NOTE — ED Notes (Signed)
Pt repositioned

## 2020-03-15 NOTE — Progress Notes (Signed)
Coude foley attempted but was unsuccessful. Dr. [place] Pink is aware.

## 2020-03-15 NOTE — ED Notes (Signed)
Report given to floor Annia Belt, RN

## 2020-03-15 NOTE — Progress Notes (Addendum)
Attempted to place 16 fr. Coude foley, but met with a lot of resistance.  Blood noted prior to attempt. Patient tolerated without complaint of. Made patient's nurse aware of unsuccessful attempt. Patrici Ranks A

## 2020-03-15 NOTE — Progress Notes (Signed)
Pt has MR conditional defib/ pacer. Unable to do exam today. Pt aware we are unable to proceed today. Attempted to call RN to make aware.

## 2020-03-15 NOTE — Progress Notes (Signed)
Inpatient Rehab Admissions Coordinator Note:   Per therapy recommendations, pt was screened for CIR candidacy by Beverly Ferner, MS CCC-SLP. At this time, Pt. Appears to have functional decline and is a good candidate for CIR. Will place order for rehab consult per protocol.  Please contact me with questions.   Adellyn Capek, MS, CCC-SLP Rehab Admissions Coordinator  [phone] (celll) [phone] (office)  

## 2020-03-15 NOTE — Progress Notes (Signed)
NIF= 3 good attempts best -32cmH2o VC- 3 good attempts best 1.35L

## 2020-03-15 NOTE — Progress Notes (Addendum)
 [facility] PROGRESS NOTE   James [name]. MPN:[id number] DOB: 06-20-1950 DOA:   PCP: [name], MD  Brief History/Interval Summary:  69 y.o. male with hx significant for CAD s/p CABG, hx of AFL and VT s/p ICD, CKD stage 3, DMT2 on insulin, localized prostate CA, hx of R renal oncocytoma s/p R nephrectomy who had laminectomy and spinal fusion of L3-L5 in November for lumbar spinal stenosis and has been having subacute progressive generalized weakness since surgery.  Patient was admitted to rehab on 02/13/20 and discharged on 03/04/20 post back surgery.  Per patient and wife, patient was doing well in inpatient rehab about 2 weeks into the program and actually felt that he was declining prior to discharge from rehab. Wife reported that patient was mostly sleeping all day while in rehab. He was also diagnosed with UTI (urine culture 11/27 grew Serratia) and treated with a  bactrim course. Patient relies on self straight catheterization due to urinary retention about 3 times a day (wife, caregiver, assists with caths).   Since arriving home, patient has basically been unable to get out of bed. Patient feels that his RLE is weaker than the left, which is similar to prior to the surgery -- but overall his stamina has significantly decreased over the past month. Wife also noted labile BP as low as 70/50s but then increasing back to 008-676P systolics.   Prior to surgery earlier this year, patient states that he was driving and able to perform all ADLs.     Reason for Visit: Physical deconditioning.  Urinary retention  Consultants: Urology  Procedures: Placement of 20 French coud Foley catheter  Antibiotics: Anti-infectives (From admission, onward)   None      Subjective/Interval History: Patient complains of discomfort in his lower abdomen and back which he attributes to his inability to completely empty his bladder.  Otherwise does not have any back pain at his  surgical site.  Complains of is feeling fatigued overall.     Assessment/Plan:  Generalized weakness/physical deconditioning As mentioned in HPI patient with recent lumbar surgery.  He apparently spent some time in inpatient rehab.  Looks like he was discharged on 12/1.  Has not done well at home.  He has been getting progressively weaker.  PT and OT consults pending.  UA was clear.  No other obvious source of infection noted.  His WBC was normal.  Is normal.  Vitamin B12 not deficient.  .  Continue to monitor for now.  Recent L3-L5 lumbar laminectomy and spinal fusion in November 2021 Patient has right lower extremity weakness which is from prior to surgery.  Imaging studies did not show any acute findings.  Case was discussed with neurosurgery when patient was in the ER.  Patient was seen by Dr. Venetia Constable.  No new recommendations at this time.  PT and OT evaluation.  Pain medications.  Lidoderm patch.  Notified by PT and OT that his weakness was much more significant compared to last evaluation during his last hospitalization.  Seen by neurosurgery who did not feel that this was neurosurgical in etiology.  Neurology was subsequently consulted.  Chronic kidney disease stage IIIb with history of right nephrectomy Baseline creatinine around 1.5.  Presented with creatinine of 1.8.  Delene Loll was held.  Lasix was held.  Patient given gentle IV hydration.  Renal function appears to be improving.  Creatinine down to 1.48.  Some of the renal failure could also be due to urinary retention.  Acute urinary retention Patient with bladder outlet obstruction requiring self-catheterization at home.  Overnight he was noted to have urinary retention.  Despite multiple attempts by nursing staff a Foley catheter could not be placed.  A coud also could not be placed.  Urology was urgently consulted this morning.  They were able to come and place a 20 [place] coud catheter.  This will be left in place for now.  Patient noted to be on tamsulosin which will be continued.  Followed by [name] with [facility].  Transaminitis Reason for this is not entirely clear.  Noted previously as well.  Hepatitis panel unremarkable.  Hepatic steatosis noted on previous imaging studies.  Patient is noted to be on amiodarone.  Dose was decreased.  Statin on hold currently.  Trend LFTs.  History of recent UTI UA was noted to be clear at this admission.  Recently completed course of Bactrim.  Hold off on antibiotics currently.  History of coronary artery disease status post CABG Stable.  No anginal symptoms.  Continue home medications.  Noted to be on Plavix and metoprolol.  History of ischemic cardiomyopathy/chronic systolic CHF EF known to be about 40%.  Was actually noted to be hypovolemic at admission.  His diuretics and Entresto on hold due to renal failure.  Continue to monitor.  History of ventricular tachycardia and atrial flutter On amiodarone.  He is status post ICD placement.  History of diabetes mellitus type 2 on insulin Lantus being continued.  Monitor CBGs.  HbA1c 5.9 in November.  Hypothyroidism TSH was normal at 2.69.  Free T4 noted to be mildly elevated at 1.6.  No clinical signs of hyperthyroidism noted.  Recommend rechecking this in the outpatient setting.  Will not change his treatment at this time.  History of anxiety and insomnia Continue home medications.  History of COPD Respiratory status is stable.  Normocytic anemia Hemoglobin is stable.  No evidence for overt bleeding.  Constipation Significant stool burden noted on CT scan.  Laxatives will be ordered.  Obesity Estimated body mass index is 33.15 kg/m as calculated from the following:   Height as of this encounter: 5\' 8"  (1.727 m).   Weight as of this encounter: 98.9 kg.   DVT Prophylaxis: Lovenox Code Status: Full code Family Communication: Discussed with the patient Disposition Plan: PT/OT evaluation.   May need short-term rehab  Status is: Inpatient  Remains inpatient appropriate because:Unsafe d/c plan and Inpatient level of care appropriate due to severity of illness   Dispo: The patient is from: Home              Anticipated d/c is to: To be determined              Anticipated d/c date is: 2 days              Patient currently is not medically stable to d/c.      Medications:  Scheduled: . amiodarone  200 mg Oral Daily  . vitamin C  1,000 mg Oral BID  . clopidogrel  75 mg Oral Daily  . docusate sodium  100 mg Oral BID  . enoxaparin (LOVENOX) injection  40 mg Subcutaneous QHS  . insulin aspart  0-9 Units Subcutaneous TID WC  . insulin glargine  5 Units Subcutaneous QHS  . levothyroxine  88 mcg Oral QAC breakfast  . lidocaine  1 patch Transdermal Q24H  . lidocaine  1 application Urethral Once  . melatonin  6 mg Oral  QHS  . metoprolol succinate  12.5 mg Oral Daily  . multivitamin with minerals  1 tablet Oral Daily  . tamsulosin  0.4 mg Oral QHS  . [START ON 03/16/2020] Vitamin D (Ergocalciferol)  50,000 Units Oral Q Mon   Continuous:  QBH:ALPFXTKWIOXBD, albuterol, bisacodyl, cyclobenzaprine, LORazepam, oxyCODONE   Objective:  Vital Signs  Vitals:   03/15/20 0145 03/15/20 0145 03/15/20 0225 03/15/20 0751  BP: (!) 157/62  (!) 166/75 (!) 158/66  Pulse: (!) 59  73 71  Resp: 16  17 18   Temp:  (!) 97.4 F (36.3 C) 97.7 F (36.5 C) (!) 97.4 F (36.3 C)  TempSrc:  Oral Oral Oral  SpO2: 100%  99% 100%  Weight:      Height:       No intake or output data in the 24 hours ending 03/15/20 0944 Filed Weights   03/20/2020 1823  Weight: 98.9 kg    General appearance: Awake alert.  In no distress Resp: Clear to auscultation bilaterally.  Normal effort Cardio: S1-S2 is normal regular.  No S3-S4.  No rubs murmurs or bruit GI: Abdomen is soft.  Mildly tender in the suprapubic area.  No rebound rigidity or guarding.  Bowel sounds present.  No masses organomegaly.    Extremities: No edema.  Neurologic: No obvious new focal deficits.  Motor strength deficits noted in the lower extremities as known previously.     Lab Results:  Data Reviewed: I have personally reviewed following labs and imaging studies  CBC: Recent Labs  Lab 03/15/2020 1320 03/15/20 0303  WBC 9.8 8.3  HGB 11.4* 11.7*  HCT 36.2* 36.8*  MCV 89.4 87.8  PLT 336 532    Basic Metabolic Panel: Recent Labs  Lab 03/05/2020 1320 03/15/20 0303  NA 137 140  K 3.8 3.5  CL 103 104  CO2 23 22  GLUCOSE 134* 137*  BUN 40* 35*  CREATININE 1.80* 1.48*  CALCIUM 9.2 9.3    GFR: Estimated Creatinine Clearance: 54.5 mL/min (A) (by C-G formula based on SCr of 1.48 mg/dL (H)).  Liver Function Tests: Recent Labs  Lab 03/15/2020 2114 03/15/20 0303  AST 102* 111*  ALT 230* 239*  ALKPHOS 179* 172*  BILITOT 0.7 0.8  PROT 7.0 6.8  ALBUMIN 3.1* 3.2*     Recent Labs  Lab 03/07/2020 2114  AMMONIA 11    Cardiac Enzymes: Recent Labs  Lab 03/15/20 0303  CKTOTAL 49    BNP (last 3 results) Recent Labs    01/13/20 1403  PROBNP 1,199*    CBG: Recent Labs  Lab 03/18/2020 1800 03/15/20 0225  GLUCAP 127* 117*     Thyroid Function Tests: Recent Labs    03/15/20 0303  TSH 2.699  FREET4 1.63*    Anemia Panel: Recent Labs    03/15/20 0303  VITAMINB12 1,421*    Recent Results (from the past 240 hour(s))  Resp Panel by RT-PCR (Flu A&B, Covid) Nasopharyngeal Swab     Status: None   Collection Time: 03/28/2020  8:37 PM   Specimen: Nasopharyngeal Swab; Nasopharyngeal(NP) swabs in vial transport medium  Result Value Ref Range Status   SARS Coronavirus 2 by RT PCR NEGATIVE NEGATIVE Final    Comment: (NOTE) SARS-CoV-2 target nucleic acids are NOT DETECTED.  The SARS-CoV-2 RNA is generally detectable in upper respiratory specimens during the acute phase of infection. The lowest concentration of SARS-CoV-2 viral copies this assay can detect is 138 copies/mL. A negative  result does not preclude SARS-Cov-2 infection and  should not be used as the sole basis for treatment or other patient management decisions. A negative result may occur with  improper specimen collection/handling, submission of specimen other than nasopharyngeal swab, presence of viral mutation(s) within the areas targeted by this assay, and inadequate number of viral copies(<138 copies/mL). A negative result must be combined with clinical observations, patient history, and epidemiological information. The expected result is Negative.  Fact Sheet for Patients:  EntrepreneurPulse.com.au  Fact Sheet for Healthcare Providers:  IncredibleEmployment.be  This test is no t yet approved or cleared by the [place] FDA and  has been authorized for detection and/or diagnosis of SARS-CoV-2 by FDA under an Emergency Use Authorization (EUA). This EUA will remain  in effect (meaning this test can be used) for the duration of the COVID-19 declaration under Section 564(b)(1) of the Act, 21 U.S.C.section 360bbb-3(b)(1), unless the authorization is terminated  or revoked sooner.       Influenza A by PCR NEGATIVE NEGATIVE Final   Influenza B by PCR NEGATIVE NEGATIVE Final    Comment: (NOTE) The Xpert Xpress SARS-CoV-2/FLU/RSV plus assay is intended as an aid in the diagnosis of influenza from Nasopharyngeal swab specimens and should not be used as a sole basis for treatment. Nasal washings and aspirates are unacceptable for Xpert Xpress SARS-CoV-2/FLU/RSV testing.  Fact Sheet for Patients: EntrepreneurPulse.com.au  Fact Sheet for Healthcare Providers: IncredibleEmployment.be  This test is not yet approved or cleared by the [place] FDA and has been authorized for detection and/or diagnosis of SARS-CoV-2 by FDA under an Emergency Use Authorization (EUA). This EUA will remain in effect (meaning this test can be used)  for the duration of the COVID-19 declaration under Section 564(b)(1) of the Act, 21 U.S.C. section 360bbb-3(b)(1), unless the authorization is terminated or revoked.  Performed at [facility], [address], [place], [place] [address]       Radiology Studies: CT Head Wo Contrast  Result Date: 03/19/2020 CLINICAL DATA:  Awoke with bilateral leg weakness and numbness. EXAM: CT HEAD WITHOUT CONTRAST TECHNIQUE: Contiguous axial images were obtained from the base of the skull through the vertex without intravenous contrast. COMPARISON:  Head CT 06/25/2019 FINDINGS: Brain: Brain volume is normal for age. No intracranial hemorrhage, mass effect, or midline shift. No hydrocephalus. The basilar cisterns are patent. No evidence of territorial infarct or acute ischemia. No extra-axial or intracranial fluid collection. Vascular: Atherosclerosis of skullbase vasculature without hyperdense vessel or abnormal calcification. Skull: No fracture or focal lesion. Sinuses/Orbits: No acute findings. Bilateral cataract resection. Chronic opacification of right mastoid air cells with slight improvement from prior exam. Chronic opacification of lower left mastoid air cells. Other: None. IMPRESSION: 1. No acute intracranial abnormality. 2. Chronic opacification of right mastoid air cells with slight improvement from prior exam. Chronic opacification of lower left mastoid air cells. Electronically Signed   By: Keith Rake M.D.   On: 04/03/2020 20:38   CT L-SPINE NO CHARGE  Result Date: 03/08/2020 CLINICAL DATA:  Back pain x6 weeks. Cauda carina syndrome. Unable to walk. Recent laminectomy. EXAM: CT ABDOMEN AND PELVIS WITHOUT CONTRAST CT LUMBAR SPINE WITHOUT CONTRAST TECHNIQUE: Multidetector CT imaging of the abdomen and pelvis was performed following the standard protocol without IV contrast. Multiplanar CT images of the lumbar spine were reconstructed from contemporary CT of the Abdomen, and Pelvis  COMPARISON:  MRI lumbar spine dated 12/13/2019. CT L-spine dated June 28, 2019. FINDINGS: Lower chest: There is atelectasis at the lung bases.The heart size is  normal. Hepatobiliary: The liver is normal. There is gallbladder sludge without CT evidence for acute cholecystitis.There is no biliary ductal dilation. Pancreas: Normal contours without ductal dilatation. No peripancreatic fluid collection. Spleen: Unremarkable. Adrenals/Urinary Tract: --Adrenal glands: Unremarkable. --Right kidney/ureter: Patient is status post prior right-sided nephrectomy. --Left kidney/ureter: No hydronephrosis or radiopaque kidney stones. --Urinary bladder: Unremarkable. Stomach/Bowel: --Stomach/Duodenum: No hiatal hernia or other gastric abnormality. Normal duodenal course and caliber. --Small bowel: Unremarkable. --Colon: There is a moderate amount of stool in the rectum. --Appendix: Normal. Vascular/Lymphatic: Atherosclerotic calcification is present within the non-aneurysmal abdominal aorta, without hemodynamically significant stenosis. --No retroperitoneal lymphadenopathy. --No mesenteric lymphadenopathy. --No pelvic or inguinal lymphadenopathy. Reproductive: There is significant prostatomegaly. Other: No ascites or free air. There are bilateral fat containing inguinal hernias. Musculoskeletal. The patient is status post prior L3 through L5 posterior fusion with interbody spacers at the L3-L4 and L4-L5 levels. The patient is status post prior L3-L4 laminectomy. Evaluation of the lower lumbar spine is limited by streak artifact from the adjacent metallic hardware. There is moderate disc height loss at the L5-S1 level. There is probable moderate right-sided osseous neural foraminal narrowing. There is no acute compression fracture. IMPRESSION: 1. No CT evidence for acute intra-abdominal or intrapelvic pathology. 2. Status post prior right-sided nephrectomy. 3. Moderate amount of stool in the rectum. 4. Marked prostatomegaly. 5.  Bilateral fat containing inguinal hernias. 6. The patient is status post prior L3 through L5 posterior fusion and decompression with multilevel interbody spacers. The hardware is intact. There is persistent at least moderate neural foraminal stenosis at the L5 level on the right. There is no acute compression fracture. Evaluation of the spinal canal is significantly limited by metallic streak artifact. Aortic Atherosclerosis (ICD10-I70.0). Electronically Signed   By: Constance Holster M.D.   On: 04/02/2020 20:40   DG Chest Port 1 View  Result Date: 03/19/2020 CLINICAL DATA:  Generalized weakness. Leg weakness. EXAM: PORTABLE CHEST 1 VIEW COMPARISON:  08/28/2019 FINDINGS: Left-sided pacemaker in place. Post median sternotomy. Stable mild cardiomegaly. Unchanged mediastinal contours. Aortic atherosclerosis. Minimal subsegmental atelectasis or scarring at the left lung base. No new or progressive airspace disease. No pleural fluid or pneumothorax. No pulmonary edema. No acute osseous abnormalities are seen. IMPRESSION: Stable mild cardiomegaly. No acute abnormality. Aortic Atherosclerosis (ICD10-I70.0). Electronically Signed   By: Keith Rake M.D.   On: 03/07/2020 18:35   CT Renal Stone Study  Result Date:  CLINICAL DATA:  Back pain x6 weeks. Cauda carina syndrome. Unable to walk. Recent laminectomy. EXAM: CT ABDOMEN AND PELVIS WITHOUT CONTRAST CT LUMBAR SPINE WITHOUT CONTRAST TECHNIQUE: Multidetector CT imaging of the abdomen and pelvis was performed following the standard protocol without IV contrast. Multiplanar CT images of the lumbar spine were reconstructed from contemporary CT of the Abdomen, and Pelvis COMPARISON:  MRI lumbar spine dated 12/13/2019. CT L-spine dated June 28, 2019. FINDINGS: Lower chest: There is atelectasis at the lung bases.The heart size is normal. Hepatobiliary: The liver is normal. There is gallbladder sludge without CT evidence for acute cholecystitis.There is no  biliary ductal dilation. Pancreas: Normal contours without ductal dilatation. No peripancreatic fluid collection. Spleen: Unremarkable. Adrenals/Urinary Tract: --Adrenal glands: Unremarkable. --Right kidney/ureter: Patient is status post prior right-sided nephrectomy. --Left kidney/ureter: No hydronephrosis or radiopaque kidney stones. --Urinary bladder: Unremarkable. Stomach/Bowel: --Stomach/Duodenum: No hiatal hernia or other gastric abnormality. Normal duodenal course and caliber. --Small bowel: Unremarkable. --Colon: There is a moderate amount of stool in the rectum. --Appendix: Normal. Vascular/Lymphatic: Atherosclerotic calcification is present within the non-aneurysmal  abdominal aorta, without hemodynamically significant stenosis. --No retroperitoneal lymphadenopathy. --No mesenteric lymphadenopathy. --No pelvic or inguinal lymphadenopathy. Reproductive: There is significant prostatomegaly. Other: No ascites or free air. There are bilateral fat containing inguinal hernias. Musculoskeletal. The patient is status post prior L3 through L5 posterior fusion with interbody spacers at the L3-L4 and L4-L5 levels. The patient is status post prior L3-L4 laminectomy. Evaluation of the lower lumbar spine is limited by streak artifact from the adjacent metallic hardware. There is moderate disc height loss at the L5-S1 level. There is probable moderate right-sided osseous neural foraminal narrowing. There is no acute compression fracture. IMPRESSION: 1. No CT evidence for acute intra-abdominal or intrapelvic pathology. 2. Status post prior right-sided nephrectomy. 3. Moderate amount of stool in the rectum. 4. Marked prostatomegaly. 5. Bilateral fat containing inguinal hernias. 6. The patient is status post prior L3 through L5 posterior fusion and decompression with multilevel interbody spacers. The hardware is intact. There is persistent at least moderate neural foraminal stenosis at the L5 level on the right. There is no  acute compression fracture. Evaluation of the spinal canal is significantly limited by metallic streak artifact. Aortic Atherosclerosis (ICD10-I70.0). Electronically Signed   By: Constance Holster M.D.   On: 03/19/2020 20:40       LOS: 1 day   [facility] Pager on [email or web address]  03/15/2020, 9:44 AM

## 2020-03-15 NOTE — Progress Notes (Signed)
Occupational Therapy Evaluation  Pt seen in conjunction with PT.  Pt demonstrates significant bil. UE and LE weakness as well as core weakness with Rt sided weakness > Lt side (pt has long standing shoulder limitations on the Lt).   He currently requires max - total A with all aspects of ADLs, max A +2 for bed mobilty, mod A for sitting balance EOB with a heavy Rt lateral and posterior lean.  He required +2 max A for lateral scoot transfer as he is unable to stand.  Upon review of progress notes from CIR, pt was ambulating with min guard assist short distances with a RW, and was able to perform ADLs with setup assist - min guard assist with use of AE.  Pt's current functional status has significantly decreased in the past two weeks.  He lives with his wife who has been providing assistance with the help of their  daughter.   He will benefit from continued OT and will likely need extensive post acute rehab at discharge  03/15/20 1300  OT Visit Information  Last OT Received On 03/15/20  Assistance Needed +2  PT/OT/SLP Co-Evaluation/Treatment Yes  Reason for Co-Treatment Complexity of the patient's impairments (multi-system involvement);For patient/therapist safety;To address functional/ADL transfers  OT goals addressed during session ADL's and self-care  History of Present Illness Pt is a 69 year old male who presented with subacute progressive generalized weakness following his recent surgery in November 2021 in which he received a laminectomy and spinal fusion of L3-5 for lumbar spinal stenosis. He reported feeling like he was doing well initial 2 weeks in inpatient rehab following the surgery but then started to decline prior to discharge. He reported R leg weakness > L, similar to presentation prior to surgery. Since surgery pt has also had a UTI and a new sacral decubitus ulcer. CT of head negative for acute intracranial abnormalities. CT of abdomen negative for acute intra-abdominal or intrapelvic  pathology and showing intact L3-5 surgical hardware and no acute compression fracture. There is persistent at least moderate neural foraminal stenosis at the L5 level on the R, per CT report. Neurology consulted and pt being worked up for GBS.  PMH: ventricular trachyarrhythmia, typical atrial flutter, s/p ICD, renal oncocytoma of R kidney, obese, SNTEMI, metabolic bone disease, HTN, dyspnea, DM, COPD GOLD II, CKD stage 3, CHF, CAD, and localized prostate CA.  Precautions  Precautions Back;Fall  Precaution Booklet Issued No  Precaution Comments Reviewed spinal precuations with pt able to recall 3/3 verbally  Required Braces or Orthoses Spinal Brace  Spinal Brace Lumbar corset;Applied in sitting position  Home Living  Family/patient expects to be discharged to: Private residence  Living Arrangements Spouse/significant other  Available Help at Discharge Family;Available 24 hours/day (wife works 30 hours a week; daughter able to assist when wife is working)  Type of Barrister's clerk to enter  [employer] of Steps 1 stoop (5")  Entrance Stairs-Rails None  Home Layout One level;Laundry or work area in basement (completely finished basement that he has avoided for > 2 years due to stairs)  Teacher, English as a foreign language No  Research scientist (life sciences) - 4 wheels;Toilet riser;Walker - 2 wheels;BSC;Other (comment);Crutches;Shower seat;Tub bench;Grab bars - tub/shower;Grab bars - toilet;Hand held shower head;Transport chair (hurrycane)  Additional Comments 3 in 1, suction grab bars in shower, Rollator   Lives With Spouse;Other (Comment) (dog named "Gizmo")  Prior Function  Level of Independence Needs assistance  Gait / Transfers Assistance Needed used rollator to ambulate prior to surgery. Pt reports being unable to move prior to this admission.  He reports gradual onset of decline in function.  He was ambulatory after  leaving CIR, but has not been able to stand or walk in the past 3 days, or so. Was getting up with 1 person > 3 days ago but last several days has needed 3 peopel to get up. Wife confirms.  ADL's / Homemaking Assistance Needed wife has been assisting with bathing, dressing, tub transfers due to increased back pain and increased weakness  Comments Immediately prior to surgery, pt had been declining due to pain, unable to stand upright prior to surgery. In March, was walking without AD/AE independently and driving, but progressively declined.  Communication  Communication No difficulties  Pain Assessment  Pain Location back; location of ulcer  Pain Descriptors / Indicators Sharp  Cognition  Arousal/Alertness Awake/alert  Behavior During Therapy WFL for tasks assessed/performed  Overall Cognitive Status Within Functional Limits for tasks assessed  General Comments A&Ox4.  Upper Extremity Assessment  Upper Extremity Assessment RUE deficits/detail;LUE deficits/detail  RUE Deficits / Details Shoulder 3/5; elbow 3-/5; wrist ext 2+/5 wrist flexion 3/5; grip 3/5.  Pt with intrinsic weakness - only able to oppose to digit 3.  He reports glove like numbness and tingling to wrist  RUE Sensation decreased light touch  RUE Coordination decreased fine motor;decreased gross motor  LUE Deficits / Details shoulder 1/5 - long standing h/o arthrtitis and inability to move shoulder (he reports since he was in his 52s); Lt elbow flexion 3+/5; elbow extension 4-/5; wrist 3+/5; hand 4-/5  Lower Extremity Assessment  RLE Deficits / Details MMT scores of the following noted: hip flexion 2+, knee extension 3, knee flexion 2+, ankle dorsiflexion 0  RLE Sensation decreased light touch (sensation to light touch absent at knee and below except lateral ankle intact)  RLE Coordination decreased fine motor;decreased gross motor  LLE Deficits / Details MMT scores of the following noted: hip flexion 3-, knee extension 2+, knee  flexion 2+, ankle dorsiflexion 3-  LLE Sensation decreased light touch (slight numbness at dorsal foot and no sensation dorsal great toe)  LLE Coordination decreased fine motor;decreased gross motor  Cervical / Trunk Assessment  Cervical / Trunk Assessment Kyphotic  ADL  Overall ADL's  Needs assistance/impaired  Eating/Feeding Maximal assistance;Bed level  Grooming Wash/dry face;Wash/dry hands;Oral care;Brushing hair;Maximal assistance;Sitting;Bed level  Upper Body Bathing Maximal assistance;Sitting  Lower Body Bathing Total assistance;Bed level  Upper Body Dressing  Total assistance;Sitting  Lower Body Dressing Total assistance;Bed level  Toilet Transfer Total assistance  Toilet Transfer Details (indicate cue type and reason) unable  Toileting- Clothing Manipulation and Hygiene Total assistance;Bed level  Toileting - Clothing Manipulation Details (indicate cue type and reason) Pt incontinent of stool with no awareness  Functional mobility during ADLs Maximal assistance;+2 for physical assistance (scoot transfer)  Vision- History  Baseline Vision/History Wears glasses  Wears Glasses Reading only  Patient Visual Report No change from baseline  Vision- Assessment  Vision Assessment? Yes  Eye Alignment WFL  Ocular Range of Motion [facility]  Alignment/Gaze Preference WDL  Tracking/Visual Pursuits Able to track stimulus in all quads without difficulty  Visual Fields No apparent deficits  Additional Comments reports vision is mildly blurred  Perception  Perception Tested? No  Praxis  Praxis tested? WFL  Bed Mobility  Overal bed mobility Needs Assistance  Bed Mobility Rolling;Sidelying to Sit  Rolling Max assist;+2 for  physical assistance  Sidelying to sit Max assist;+2 for physical assistance;HOB elevated  General bed mobility comments HOB elevated throughout for comfort, cuing pt to flex R knee and place R foot on bed surface along to push while reaching R hand to L bed rail to log  roll, with min initiation noted from pt and thus maxAx2 to complete. Cued pt to bring legs off EOB and push up with arms to sit up, but maxAx2 to manage legs and trunk with min activation from pt.  Transfers  Overall transfer level Needs assistance  Equipment used None;Ambulation equipment used  Transfer via Biomedical scientist Transfers Max assist;+2 physical assistance;+2 safety/equipment;From elevated surface  General transfer comment Lateral scooting transfer to L from bed to recliner with bilat knee block and maxAx2 and cues to pull with arms and push with legs to scoot 3x to successfully get into chair. Unable to safely return back to bed due to fatigue thus TA for maximove transfer to bed.  Balance  Overall balance assessment Needs assistance  Sitting-balance support Bilateral upper extremity supported;Feet supported  Sitting balance-Leahy Scale Poor  Sitting balance - Comments Bilat UE support with min-maxA and moments of only min guard assist to sit statically EOB. tendency to lean laterally to R and posteriorly, with cues to correct and momentary success.  Standing balance comment Unable  General Comments  General comments (skin integrity, edema, etc.) wife present for second portion of eval  OT - End of Session  Equipment Utilized During Treatment Other (comment) (maxi move)  Activity Tolerance Patient limited by fatigue  Patient left in bed;with call bell/phone within reach;with bed alarm set;with nursing/sitter in room;with family/visitor present  Nurse Communication Mobility status;Need for lift equipment;Patient requests pain meds  OT Assessment  OT Recommendation/Assessment Patient needs continued OT Services  OT Visit Diagnosis Unsteadiness on feet (R26.81);Muscle weakness (generalized) (M62.81);Pain  OT Problem List Decreased strength;Decreased range of motion;Decreased activity tolerance;Impaired balance (sitting  and/or standing);Decreased coordination;Decreased safety awareness;Decreased knowledge of use of DME or AE;Impaired sensation;Impaired UE functional use;Obesity;Pain  OT Plan  OT Frequency (ACUTE ONLY) Min 2X/week  OT Treatment/Interventions (ACUTE ONLY) Self-care/ADL training;Therapeutic exercise;Neuromuscular education;DME and/or AE instruction;Manual therapy;Splinting;Therapeutic activities;Patient/family education;Balance training  AM-PAC OT "6 Clicks" Daily Activity Outcome Measure (Version 2)  Help from another person eating meals? 2  Help from another person taking care of personal grooming? 2  Help from another person toileting, which includes using toliet, bedpan, or urinal? 1  Help from another person bathing (including washing, rinsing, drying)? 2  Help from another person to put on and taking off regular upper body clothing? 1  Help from another person to put on and taking off regular lower body clothing? 1  6 Click Score 9  OT Recommendation  Recommendations for Other Services Rehab consult  Follow Up Recommendations CIR;Supervision/Assistance - 24 hour  OT Equipment None recommended by OT  Individuals Consulted  Consulted and Agree with Results and Recommendations Patient  Acute Rehab OT Goals  Patient Stated Goal to figure out what is wrong  OT Goal Formulation With patient/family  Time For Goal Achievement 03/29/20  Potential to Achieve Goals Good  OT Time Calculation  OT Start Time (ACUTE ONLY) 0957  OT Stop Time (ACUTE ONLY) 1055  OT Time Calculation (min) 58 min  OT General Charges  $OT Visit 1 Visit  OT Evaluation  $OT Eval Moderate Complexity 1 Mod  OT Treatments  $Self Care/Home Management  8-22 mins  $  Therapeutic Activity 23-37 mins  Written Expression  Dominant Hand Right  Nilsa Nutting., OTR/L Acute Rehabilitation Services Pager [phone] Office [phone]

## 2020-03-16 ENCOUNTER — Inpatient Hospital Stay (HOSPITAL_COMMUNITY): Payer: Medicare Other

## 2020-03-16 DIAGNOSIS — E440 Moderate protein-calorie malnutrition: Secondary | ICD-10-CM | POA: Insufficient documentation

## 2020-03-16 LAB — C-REACTIVE PROTEIN: CRP: 1 mg/dL — ABNORMAL HIGH (ref <1.0)

## 2020-03-16 LAB — COMPREHENSIVE METABOLIC PANEL
ALT: 174 U/L — ABNORMAL HIGH (ref 0–44)
AST: 77 U/L — ABNORMAL HIGH (ref 15–41)
Albumin: 2.7 g/dL — ABNORMAL LOW (ref 3.5–5.0)
Alkaline Phosphatase: 145 U/L — ABNORMAL HIGH (ref 38–126)
Anion gap: 11 (ref 5–15)
BUN: 29 mg/dL — ABNORMAL HIGH (ref 8–23)
CO2: 23 mmol/L (ref 22–32)
Calcium: 9.2 mg/dL (ref 8.9–10.3)
Chloride: 107 mmol/L (ref 98–111)
Creatinine, Ser: 1.38 mg/dL — ABNORMAL HIGH (ref 0.61–1.24)
GFR, Estimated: 56 mL/min — ABNORMAL LOW (ref >60)
Glucose, Bld: 129 mg/dL — ABNORMAL HIGH (ref 70–99)
Potassium: 3.4 mmol/L — ABNORMAL LOW (ref 3.5–5.1)
Sodium: 141 mmol/L (ref 135–145)
Total Bilirubin: 0.8 mg/dL (ref 0.3–1.2)
Total Protein: 5.9 g/dL — ABNORMAL LOW (ref 6.5–8.1)

## 2020-03-16 LAB — GLUCOSE, CAPILLARY
Glucose-Capillary: 138 mg/dL — ABNORMAL HIGH (ref 70–99)
Glucose-Capillary: 122 mg/dL — ABNORMAL HIGH (ref 70–99)
Glucose-Capillary: 135 mg/dL — ABNORMAL HIGH (ref 70–99)
Glucose-Capillary: 130 mg/dL — ABNORMAL HIGH (ref 70–99)
Glucose-Capillary: 180 mg/dL — ABNORMAL HIGH (ref 70–99)

## 2020-03-16 LAB — CBC
HCT: 32.1 % — ABNORMAL LOW (ref 39.0–52.0)
Hemoglobin: 10.7 g/dL — ABNORMAL LOW (ref 13.0–17.0)
MCH: 29.1 pg (ref 26.0–34.0)
MCHC: 33.3 g/dL (ref 30.0–36.0)
MCV: 87.2 fL (ref 80.0–100.0)
Platelets: 329 10*3/uL (ref 150–400)
RBC: 3.68 MIL/uL — ABNORMAL LOW (ref 4.22–5.81)
RDW: 14.3 % (ref 11.5–15.5)
WBC: 6.5 10*3/uL (ref 4.0–10.5)
nRBC: 0 % (ref 0.0–0.2)

## 2020-03-16 LAB — SEDIMENTATION RATE: Sed Rate: 32 mm/hr — ABNORMAL HIGH (ref 0–16)

## 2020-03-16 MED ORDER — OXYCODONE HCL 5 MG PO TABS
10.0000 mg | ORAL_TABLET | Freq: Once | ORAL | Status: AC | PRN
Start: 2020-03-16 — End: 2020-03-16
  Administered 2020-03-16: 10 mg via ORAL

## 2020-03-16 MED ORDER — ADULT MULTIVITAMIN W/MINERALS CH
1.0000 | ORAL_TABLET | Freq: Every day | ORAL | Status: DC
  Administered 2020-03-17 – 2020-03-20 (×4): 1 via ORAL
  Filled 2020-03-16 (×3): qty 1

## 2020-03-16 MED ORDER — KETOROLAC TROMETHAMINE 30 MG/ML IJ SOLN
15.0000 mg | Freq: Once | INTRAMUSCULAR | Status: AC | PRN
  Administered 2020-03-16: 15 mg via INTRAVENOUS
  Filled 2020-03-16: qty 1

## 2020-03-16 MED ORDER — CHLORHEXIDINE GLUCONATE CLOTH 2 % EX PADS
6.0000 | MEDICATED_PAD | Freq: Every day | CUTANEOUS | Status: DC
  Administered 2020-03-16 – 2020-03-20 (×5): 6 via TOPICAL

## 2020-03-16 MED ORDER — POTASSIUM CHLORIDE CRYS ER 20 MEQ PO TBCR
40.0000 meq | EXTENDED_RELEASE_TABLET | Freq: Once | ORAL | Status: AC
  Administered 2020-03-16: 11:00:00 40 meq via ORAL
  Filled 2020-03-16: qty 2

## 2020-03-16 MED ORDER — MORPHINE SULFATE (PF) 2 MG/ML IV SOLN: 2.0000 mg | INTRAVENOUS | Status: DC | PRN

## 2020-03-16 MED ORDER — LIDOCAINE HCL (PF) 1 % IJ SOLN
5.0000 mL | Freq: Once | INTRAMUSCULAR | Status: DC
  Filled 2020-03-16: qty 5

## 2020-03-16 MED ORDER — ENSURE ENLIVE PO LIQD
237.0000 mL | Freq: Three times a day (TID) | ORAL | Status: DC
  Administered 2020-03-16 – 2020-03-20 (×12): 237 mL via ORAL

## 2020-03-16 MED ORDER — OXYCODONE HCL 5 MG PO TABS
5.0000 mg | ORAL_TABLET | ORAL | Status: DC | PRN
Start: 2020-03-16 — End: 2020-03-21
  Administered 2020-03-16 – 2020-03-17 (×2): 5 mg via ORAL
  Administered 2020-03-17 – 2020-03-18 (×2): 10 mg via ORAL
  Administered 2020-03-18: 5 mg via ORAL
  Administered 2020-03-18 – 2020-03-21 (×9): 10 mg via ORAL
  Filled 2020-03-16 (×4): qty 2
  Filled 2020-03-16 (×2): qty 1
  Filled 2020-03-16 (×8): qty 2
  Filled 2020-03-16: qty 1
  Filled 2020-03-16: qty 2

## 2020-03-16 NOTE — Progress Notes (Signed)
PT Cancellation Note  Patient Details Name: James [name]. MRN: [id number] DOB: Feb 22, 1951   Cancelled Treatment:    Reason Eval/Treat Not Completed: Patient at procedure or test/unavailable . Pt left for Fluoro for LP. When pt returns he will be on bedrest for 4 hours. PT to return as able to progress mobility, as appropriate.  Kittie Plater, PT, DPT Acute Rehabilitation Services Pager #: [phone] Office #: [phone]   Berline Lopes 03/16/2020, 7:58 AM

## 2020-03-16 NOTE — Progress Notes (Signed)
NIF- 3 good attempts, best -35cmH20 VC- 3 good attempts, best 1.5L

## 2020-03-16 NOTE — Progress Notes (Signed)
Subjective: The patient is alert and pleasant.  His wife is at the bedside.  He tells me he did well until a few days ago when he woke up with generalized weakness.  He doesn't recall any particular injury.  Objective: Vital signs in last 24 hours: Temp:  [97.8 F (36.6 C)-99.1 F (37.3 C)] 98.6 F (37 C) (12/13 1231) Pulse Rate:  [60-73] 60 (12/13 1231) Resp:  [16-18] 16 (12/13 1231) BP: (114-154)/(59-71) 146/66 (12/13 1231) SpO2:  [97 %-99 %] 98 % (12/13 1231) Estimated body mass index is 33.15 kg/m as calculated from the following:   Height as of this encounter: 5\' 8"  (1.727 m).   Weight as of this encounter: 98.9 kg.   Intake/Output from previous day: 12/12 0701 - 12/13 0700 In: -  Out: 1050 [Urine:1050] Intake/Output this shift: No intake/output data recorded.  Physical exam the patient is alert and oriented.  He is generally weak in all 4 extremities.  He is areflexic in his lower extremities.  Lab Results: Recent Labs    03/15/20 0303 03/16/20 0410  WBC 8.3 6.5  HGB 11.7* 10.7*  HCT 36.8* 32.1*  PLT 344 329   BMET Recent Labs    03/15/20 0303 03/16/20 0410  NA 140 141  K 3.5 3.4*  CL 104 107  CO2 22 23  GLUCOSE 137* 129*  BUN 35* 29*  CREATININE 1.48* 1.38*  CALCIUM 9.3 9.2    Studies/Results: CT Head Wo Contrast  Result Date: 03/05/2020 CLINICAL DATA:  Awoke with bilateral leg weakness and numbness. EXAM: CT HEAD WITHOUT CONTRAST TECHNIQUE: Contiguous axial images were obtained from the base of the skull through the vertex without intravenous contrast. COMPARISON:  Head CT 06/25/2019 FINDINGS: Brain: Brain volume is normal for age. No intracranial hemorrhage, mass effect, or midline shift. No hydrocephalus. The basilar cisterns are patent. No evidence of territorial infarct or acute ischemia. No extra-axial or intracranial fluid collection. Vascular: Atherosclerosis of skullbase vasculature without hyperdense vessel or abnormal calcification. Skull: No  fracture or focal lesion. Sinuses/Orbits: No acute findings. Bilateral cataract resection. Chronic opacification of right mastoid air cells with slight improvement from prior exam. Chronic opacification of lower left mastoid air cells. Other: None. IMPRESSION: 1. No acute intracranial abnormality. 2. Chronic opacification of right mastoid air cells with slight improvement from prior exam. Chronic opacification of lower left mastoid air cells. Electronically Signed   By: Keith Rake M.D.   On: 03/19/2020 20:38   CT L-SPINE NO CHARGE  Result Date: 03/28/2020 CLINICAL DATA:  Back pain x6 weeks. Cauda carina syndrome. Unable to walk. Recent laminectomy. EXAM: CT ABDOMEN AND PELVIS WITHOUT CONTRAST CT LUMBAR SPINE WITHOUT CONTRAST TECHNIQUE: Multidetector CT imaging of the abdomen and pelvis was performed following the standard protocol without IV contrast. Multiplanar CT images of the lumbar spine were reconstructed from contemporary CT of the Abdomen, and Pelvis COMPARISON:  MRI lumbar spine dated 12/13/2019. CT L-spine dated June 28, 2019. FINDINGS: Lower chest: There is atelectasis at the lung bases.The heart size is normal. Hepatobiliary: The liver is normal. There is gallbladder sludge without CT evidence for acute cholecystitis.There is no biliary ductal dilation. Pancreas: Normal contours without ductal dilatation. No peripancreatic fluid collection. Spleen: Unremarkable. Adrenals/Urinary Tract: --Adrenal glands: Unremarkable. --Right kidney/ureter: Patient is status post prior right-sided nephrectomy. --Left kidney/ureter: No hydronephrosis or radiopaque kidney stones. --Urinary bladder: Unremarkable. Stomach/Bowel: --Stomach/Duodenum: No hiatal hernia or other gastric abnormality. Normal duodenal course and caliber. --Small bowel: Unremarkable. --Colon: There is a moderate amount  of stool in the rectum. --Appendix: Normal. Vascular/Lymphatic: Atherosclerotic calcification is present within the  non-aneurysmal abdominal aorta, without hemodynamically significant stenosis. --No retroperitoneal lymphadenopathy. --No mesenteric lymphadenopathy. --No pelvic or inguinal lymphadenopathy. Reproductive: There is significant prostatomegaly. Other: No ascites or free air. There are bilateral fat containing inguinal hernias. Musculoskeletal. The patient is status post prior L3 through L5 posterior fusion with interbody spacers at the L3-L4 and L4-L5 levels. The patient is status post prior L3-L4 laminectomy. Evaluation of the lower lumbar spine is limited by streak artifact from the adjacent metallic hardware. There is moderate disc height loss at the L5-S1 level. There is probable moderate right-sided osseous neural foraminal narrowing. There is no acute compression fracture. IMPRESSION: 1. No CT evidence for acute intra-abdominal or intrapelvic pathology. 2. Status post prior right-sided nephrectomy. 3. Moderate amount of stool in the rectum. 4. Marked prostatomegaly. 5. Bilateral fat containing inguinal hernias. 6. The patient is status post prior L3 through L5 posterior fusion and decompression with multilevel interbody spacers. The hardware is intact. There is persistent at least moderate neural foraminal stenosis at the L5 level on the right. There is no acute compression fracture. Evaluation of the spinal canal is significantly limited by metallic streak artifact. Aortic Atherosclerosis (ICD10-I70.0). Electronically Signed   By: Constance Holster M.D.   On: 03/31/2020 20:40   DG Chest Port 1 View  Result Date: 03/06/2020 CLINICAL DATA:  Generalized weakness. Leg weakness. EXAM: PORTABLE CHEST 1 VIEW COMPARISON:  08/28/2019 FINDINGS: Left-sided pacemaker in place. Post median sternotomy. Stable mild cardiomegaly. Unchanged mediastinal contours. Aortic atherosclerosis. Minimal subsegmental atelectasis or scarring at the left lung base. No new or progressive airspace disease. No pleural fluid or  pneumothorax. No pulmonary edema. No acute osseous abnormalities are seen. IMPRESSION: Stable mild cardiomegaly. No acute abnormality. Aortic Atherosclerosis (ICD10-I70.0). Electronically Signed   By: Keith Rake M.D.   On: 03/20/2020 18:35   CT Renal Stone Study  Result Date: 03/26/2020 CLINICAL DATA:  Back pain x6 weeks. Cauda carina syndrome. Unable to walk. Recent laminectomy. EXAM: CT ABDOMEN AND PELVIS WITHOUT CONTRAST CT LUMBAR SPINE WITHOUT CONTRAST TECHNIQUE: Multidetector CT imaging of the abdomen and pelvis was performed following the standard protocol without IV contrast. Multiplanar CT images of the lumbar spine were reconstructed from contemporary CT of the Abdomen, and Pelvis COMPARISON:  MRI lumbar spine dated 12/13/2019. CT L-spine dated June 28, 2019. FINDINGS: Lower chest: There is atelectasis at the lung bases.The heart size is normal. Hepatobiliary: The liver is normal. There is gallbladder sludge without CT evidence for acute cholecystitis.There is no biliary ductal dilation. Pancreas: Normal contours without ductal dilatation. No peripancreatic fluid collection. Spleen: Unremarkable. Adrenals/Urinary Tract: --Adrenal glands: Unremarkable. --Right kidney/ureter: Patient is status post prior right-sided nephrectomy. --Left kidney/ureter: No hydronephrosis or radiopaque kidney stones. --Urinary bladder: Unremarkable. Stomach/Bowel: --Stomach/Duodenum: No hiatal hernia or other gastric abnormality. Normal duodenal course and caliber. --Small bowel: Unremarkable. --Colon: There is a moderate amount of stool in the rectum. --Appendix: Normal. Vascular/Lymphatic: Atherosclerotic calcification is present within the non-aneurysmal abdominal aorta, without hemodynamically significant stenosis. --No retroperitoneal lymphadenopathy. --No mesenteric lymphadenopathy. --No pelvic or inguinal lymphadenopathy. Reproductive: There is significant prostatomegaly. Other: No ascites or free air. There  are bilateral fat containing inguinal hernias. Musculoskeletal. The patient is status post prior L3 through L5 posterior fusion with interbody spacers at the L3-L4 and L4-L5 levels. The patient is status post prior L3-L4 laminectomy. Evaluation of the lower lumbar spine is limited by streak artifact from the adjacent metallic hardware.  There is moderate disc height loss at the L5-S1 level. There is probable moderate right-sided osseous neural foraminal narrowing. There is no acute compression fracture. IMPRESSION: 1. No CT evidence for acute intra-abdominal or intrapelvic pathology. 2. Status post prior right-sided nephrectomy. 3. Moderate amount of stool in the rectum. 4. Marked prostatomegaly. 5. Bilateral fat containing inguinal hernias. 6. The patient is status post prior L3 through L5 posterior fusion and decompression with multilevel interbody spacers. The hardware is intact. There is persistent at least moderate neural foraminal stenosis at the L5 level on the right. There is no acute compression fracture. Evaluation of the spinal canal is significantly limited by metallic streak artifact. Aortic Atherosclerosis (ICD10-I70.0). Electronically Signed   By: Constance Holster M.D.   On: 03/30/2020 20:40    Assessment/Plan: Generalized weakness: His lumbar CT looks good within the limitations of a CT scan.  Obviously lumbar issues would explain his upper extremity weakness.  Possibilities include deconditioning, medical issues, GBS, cervical myelopathy, etc.  Generally I would expected him to have hyperreflexia if this was coming from his neck, however his peripheral neuropathy could be confounding the clinical picture.  I think a cervical MRI is a good idea.  I have answered all the patient's, and his wife's, questions.  LOS: 2 days     Ophelia Charter 03/16/2020, 1:17 PM

## 2020-03-16 NOTE — Progress Notes (Signed)
Neurology Progress Note   Patient ID: James [name]. is a 69 y.o. with PMHx of DM2 c/b neuropathy and CKD, CAD, CHF s/p AICD, HTN, HLD, chart-diagnosed COPD denied by patient/wife, atrial flutter (not on [facility]), renal oncocytoma (s/p right nephrectomy), localized prostate cancer (planned for surgery when medically stabilized).    Most notably he had lumbar laminectomy & fusion of L3-L5 in Nov and has been doing inpatient rehab and had just d/c from there to home. He was treated for an UTI after his rehab stay by home health. On Thr morning 12/9, the pt noted numbness bilat feet to ankles and bilat fingers up to wrists. He has no longer been able to stand or walk as he can't feel the floor beneath him. He does have some neuropathy at baseline, but intact reflexes noted on previous out pt neurology visit one month ago. The pt is now areflexic, c/f GBS vs. Cervical myleopathy, less likely infectiously process   Additional history obtained from his wife this morning.  She reports that he was confused and weak on discharge from rehab 12/1, delirious.  He was started on Bactrim for concern for UTI which initially improved his confusion.  However due to elevated liver function tests he was recommended to go to the ED for further evaluation.  He was seen at [facility] and transitioned to ciprofloxacin.  His wife felt that the patient was gradually worsening on ciprofloxacin, in terms of his weakness, although perhaps his confusion was improving.  She reports he has chronic left shoulder pain but the right shoulder pain is new.  She reports that he chronically has difficulty laying flat due to his congestive heart failure and that the chart diagnosis of COPD has been refuted by his pulmonologist.  She does confirm a 30-pack-year history of smoking.  She does feel that his confusion in the hospital has been worsening.  Overall she is concerned that his strength has been declining and it is unclear if he has been  fluctuating as much for her at home as he has been on my examinations here.  Major interval events:  - Did not tolerate LP x2 attempts due to bilateral shoulder pain  - Poorly tolerated MRI due to lower back discomfort but it was completed - NIF/FVC stable -35 to -40 and 1.3-1.5 L  Subjective: - Denies shortness of breath   Exam: Vitals:   03/16/20 1554 03/16/20 2028  BP: (!) 157/77 (!) 114/53  Pulse: 67 69  Resp: 18 17  Temp: 98 F (36.7 C) 97.7 F (36.5 C)  SpO2: 100% 97%   General - debilitated, comfortable Heart - Regular rate and rhythm  Lungs - Comfortable on room air, no wheezing or apnea Abdomen - Soft - non tender, obese  Extremities - Distal pulses intact - mild edema in bilateral hands and feet stable  Neurologic Examination:   Mental Status:  Alert, oriented to date/place/situation, thought content appropriate. Intermittently has been confused about how long he has been in the hospital and what day it is, but not today. Speech without evidence of dysarthria or aphasia.  Cranial Nerves:  II-bilateral visual fields intact grossly III/IV/VI- Extraocular movements were full.  V/VII-no facial numbness and no facial weakness.  VIII-hearing normal.  X-normal speech Motor:  Head flexion 5/5 12/13 PM, this morning 4/5 on initial effort then 5/5 on repeat testing L/R Bilat UE: finger flexion 4/4 and finger extension 4-/4-  Able to successfully press nurse button for help which he  has been unable to do Bilat LE: hip flex 2/2, ankle flex 2/4-, ankle ext 4/4+ Sensory: stocking-glove distribution loss of temperature, symmetric. Loss of proprioception at bilateral great toes and ankles Deep Tendon Reflexes: 1+ bilateral biceps, 0 to 1+ bilateral brachioradialis Cerebellar: unable due to weakness  Gait: not tested  Pertinent Labs: Last metabolic panel Lab Results  Component Value Date   GLUCOSE 129 (H) 03/16/2020   NA 141 03/16/2020   K 3.4 (L) 03/16/2020   CL 107  03/16/2020   CO2 23 03/16/2020   BUN 29 (H) 03/16/2020   CREATININE 1.38 (H) 03/16/2020   GFRNONAA 56 (L) 03/16/2020   GFRAA 56 (L) 01/13/2020   CALCIUM 9.2 03/16/2020   PROT 5.9 (L) 03/16/2020   ALBUMIN 2.7 (L) 03/16/2020   LABGLOB 2.2 01/13/2020   AGRATIO 2.1 01/13/2020   BILITOT 0.8 03/16/2020   ALKPHOS 145 (H) 03/16/2020   AST 77 (H) 03/16/2020   ALT 174 (H) 03/16/2020   ANIONGAP 11 03/16/2020   Cr improving from 1.48 prior to 1.38 on 12/13  AST/ALT/Alk phos chornically elevated    Lab Results  Component Value Date   ESRSEDRATE 32 (H) 03/16/2020   Lab Results  Component Value Date   CRP 1.0 (H) 03/16/2020   Lab Results  Component Value Date   CKTOTAL 49 03/15/2020   Lab Results  omponent Value Date   TSH 2.699 03/15/2020   Lab Results  Component Value Date   VITAMINB12 1,421 (H) 03/15/2020   Lab Results  Component Value Date   HGBA1C 5.9 (H) 02/06/2020   C-spine MRI personally reviewed and discussed with neuroradiology. Motion limited. Stenosis present but doesn't appear to have sufficient compression to explain his bilateral upper extremity weakness   Impression: This is a 69 year old male with a past medical history significant for CAD, congestive heart failure s/p AICD, coronary artery disease, hypertension, hyperlipidemia, type 2 diabetes c/b neuropathy, obesity, atrial flutter, presenting with sensory loss in the hands and feet accompanied with weakness as noted above. His exam continues to be somewhat effort dependent as well but clearly does appear to be improving and certainly not worsening. At this time given improvements in motor strength since yesterday, I do not feel GBS or infectious process is likely. Variability in reflexes may have been secondary to edema / positioning / inadequate distraction previously   Recommendations: - Cancelling LP at this time - HIV testing - Zinc, copper given zinc is on his home medication list and can cause copper  malabsoprtion - thiamine, B6  - [facility] paraneoplastic panel PAVAL, particularly for VGCC (possibility of LEMS given fluctuating symptoms and smoking history)  - Neuropathy workup: SPEP, UPEP, IFE, ANA, RF, SSA, SSB, (Anti-Hu w/ PAVAL panel above), MAG, Ganglioside panel ([place]) - EMG/NCG which cannot be done on an inpatient basis here, will be critical in clarifying etiology of his weakness and should be pursued.  - Discussion of atrial flutter with outpatient providers, consider anticoagulation (discussed with daughter).   Lesleigh Noe MD-PhD [facility] [phone]

## 2020-03-16 NOTE — Progress Notes (Signed)
Informed of MRI for today.   Device system confirmed to be MRI conditional, with implant date > 6 weeks ago and no evidence of abandoned or epicardial leads in review of most recent CXR Interrogation from today reviewed, pt is currently AP-VS at 60 bpm Change device settings for MRI to DOO at 80 bpm  Tachy-therapies to off if applicable.  Program device back to pre-MRI settings after completion of exam.  Shirley Friar, PA-C  03/16/2020 1:20 PM

## 2020-03-16 NOTE — Progress Notes (Signed)
Per order, changed device settings to DOO 80 for MRI.   Tachy therapies to off if applicable.   Will return to pre MRI settings after scan.

## 2020-03-16 NOTE — Progress Notes (Addendum)
 Initial Nutrition Assessment  DOCUMENTATION CODES:   Non-severe (moderate) malnutrition in context of acute illness/injury  INTERVENTION:   Ensure Enlive po TID, each supplement provides 350 kcal and 20 grams of protein  Continue MVI daily  Magic cup TID with meals, each supplement provides 290 kcal and 9 grams of protein  Change pt to assist with meal ordering as RD noticed pt missed lunch meal today   NUTRITION DIAGNOSIS:   Moderate Malnutrition related to acute illness (laminectomy and spinal fusion of L3-L5 for lumbar spinal stenosis) as evidenced by mild muscle depletion,moderate fat depletion,moderate muscle depletion.    GOAL:   Patient will meet greater than or equal to 90% of their needs    MONITOR:   PO intake,Supplement acceptance,Labs,Weight trends  REASON FOR ASSESSMENT:   Consult Assessment of nutrition requirement/status,Poor PO,Wound healing  ASSESSMENT:   Pt with a PMH including CAD s/p CABG, h/o AFL and VT s/p ICD, CKD stage 3, type 2 DM, localized prostate cancer, h/o R renal oncocytoma s/p R nephrectomy who had laminectomy and spinal fusion of L3-L5 in November 2021 for lumbar spinal stenosis and has been having subacute progressive generalized weakness since surgery.  Pt provided no responses to RD questions at this time. History obtained from H&P/MD notes. RN in room at time of visit.   Pt reporting weakness in upper and lower extremities. Per MD, there is concern for GBS/AIDP. Plan is for LP and MRI of cervical spine.   Pt was admitted to rehab on 02/13/20 and discharged on 03/04/20 post back surgery. Per pt and pt's wife, pt was doing well in rehab for about 2 weeks into the programs but actually felt like he began declining while still admitted. Pt's wife reported that pt was sleeping most of the day in rehab. Since arriving home, pt has essentially been bed-bound. Pt has developed a new sacral decubitus ulcer.   Pt was able to independently  perform all ADLs prior to surgery.   Reviewed weight history. Pt noted to have had a 4.25% wt loss x 1 month, which is insignificant for time frame.   No PO intake documented.   UOP: 1057ml x24 hours  Labs: K+ 3.4 (L), Cr 1.38 (H, down from yesterday), AST 77 (H, down from yesterday), ALT 174 (H, down from yesterday), CBGs 122-138 Medications: vitamin c 1000mg  BID, colace, ss novolog TID w/ meals, 5 units lantus daily, mvi with minerals, miralax, drisdol    NUTRITION - FOCUSED PHYSICAL EXAM:  Flowsheet Row Most Recent Value  Orbital Region Mild depletion  Upper Arm Region Mild depletion  Thoracic and Lumbar Region No depletion  Buccal Region Mild depletion  Temple Region Mild depletion  Clavicle Bone Region Mild depletion  Clavicle and Acromion Bone Region Mild depletion  Scapular Bone Region Mild depletion  Dorsal Hand No depletion  Patellar Region Moderate depletion  Anterior Thigh Region Moderate depletion  Posterior Calf Region Moderate depletion  Edema (RD Assessment) None  Hair Reviewed  Eyes Reviewed  Mouth Reviewed  Skin Reviewed  Nails Reviewed       Diet Order:   Diet Order            Diet heart healthy/carb modified Room service appropriate? Yes with Assist; Fluid consistency: Thin  Diet effective now                 EDUCATION NEEDS:   No education needs have been identified at this time  Skin:  Skin Assessment: Skin Integrity Issues: Skin  Integrity Issues:: Incisions,Other (Comment) Incisions: back Other: sacral decubitus ulcer  Last BM:  03/07/2020  Height:   Ht Readings from Last 1 Encounters:   5\' 8"  (1.727 m)    Weight:   Wt Readings from Last 1 Encounters:  03/29/2020 98.9 kg    BMI:  Body mass index is 33.15 kg/m.  Estimated Nutritional Needs:   Kcal:  2200-2400  Protein:  115-125 grams  Fluid:  >2.0L/d    Larkin Ina, MS, RD, LDN RD pager number and weekend/on-call pager number located in [place].

## 2020-03-16 NOTE — Progress Notes (Signed)
NIF > -40 x 2 attempts VC 2.0L x 2 attempts

## 2020-03-16 NOTE — Progress Notes (Signed)
NIF -35 VC 1.6L  Pt performed with great effort.

## 2020-03-16 NOTE — Progress Notes (Signed)
[facility] PROGRESS NOTE   James [name]. MGQ:[id number] DOB: 10/17/1950 DOA: 03/20/2020  PCP: [name], MD  Brief History/Interval Summary:  69 y.o. male with hx significant for CAD s/p CABG, hx of AFL and VT s/p ICD, CKD stage 3, DMT2 on insulin, localized prostate CA, hx of R renal oncocytoma s/p R nephrectomy who had laminectomy and spinal fusion of L3-L5 in November for lumbar spinal stenosis and has been having subacute progressive generalized weakness since surgery.  Patient was admitted to rehab on 02/13/20 and discharged on 03/04/20 post back surgery.  Per patient and wife, patient was doing well in inpatient rehab about 2 weeks into the program and actually felt that he was declining prior to discharge from rehab. Wife reported that patient was mostly sleeping all day while in rehab. He was also diagnosed with UTI (urine culture 11/27 grew Serratia) and treated with a  bactrim course. Patient relies on self straight catheterization due to urinary retention about 3 times a day (wife, caregiver, assists with caths).   Since arriving home, patient has basically been unable to get out of bed. Patient feels that his RLE is weaker than the left, which is similar to prior to the surgery -- but overall his stamina has significantly decreased over the past month. Wife also noted labile BP as low as 70/50s but then increasing back to 267-124P systolics.   Prior to surgery earlier this year, patient states that he was driving and able to perform all ADLs.     Reason for Visit: Physical deconditioning.  Urinary retention  Consultants:  Urology Neurology  Procedures: Placement of 20 French coud Foley catheter  Antibiotics: Anti-infectives (From admission, onward)   None      Subjective/Interval History: Patient complains of pain in his lower back.  Denies any shortness of breath or chest pain.  Still has weakness in his upper arms on both sides.        Assessment/Plan:  Weakness upper and lower extremity/concern for GBS/AIDP Patient noted to have weakness in his upper extremities as well.  The lower extremity weakness was attributed to his lumbar disease.  He was seen by neurosurgery.  He was subsequently seen by physical and Occupational Therapy.  There was no clear reason for his upper extremity weakness.  Neurology was subsequently consulted.  They are concerned about AIDP.  Plan is for lumbar puncture.  MRI cervical spine to be pursued.  The issue of ICD is being addressed.  Recent L3-L5 lumbar laminectomy and spinal fusion in November 2021 Patient has right lower extremity weakness which is from prior to surgery.  Imaging studies did not show any acute findings.  Case was discussed with neurosurgery when patient was in the ER.  Patient was seen by Dr. Venetia Constable.  No new recommendations at this time.  Pain medications.  Lidoderm patch.  Chronic kidney disease stage IIIb with history of right nephrectomy Baseline creatinine around 1.5.  Presented with creatinine of 1.8.  James [name] was held.  Lasix was held.  Patient given gentle IV hydration.  Renal function improving.  Creatinine down to 1.38.  Replace potassium.  Urine retention likely contributed to his renal failure as well.    Acute urinary retention Patient with bladder outlet obstruction requiring self-catheterization at home.  On 12/11 night he was noted to have urinary retention.  Despite multiple attempts by nursing staff a Foley catheter could not be placed.  A coud also could not be placed either.  Urology was consulted and they were able to place a 20 [place] coud catheter.  Tamsulosin to be continued.  Leave Foley in for now.  Patient needs to follow-up with his urology at [facility], [name].    Transaminitis Reason for this is not entirely clear.  Noted previously as well.  Hepatitis panel unremarkable.  Hepatic steatosis noted on previous imaging  studies.  Patient is noted to be on amiodarone.  Dose was decreased.  Statin on hold currently.  LFTs noted to be slightly better today.  History of recent UTI UA was noted to be clear at this admission.  Recently completed course of Bactrim.  Hold off on antibiotics currently.  History of coronary artery disease status post CABG Stable.  No anginal symptoms.  Continue home medications.  Noted to be on Plavix and metoprolol.  History of ischemic cardiomyopathy/chronic systolic CHF EF known to be about 40%.  Was actually noted to be hypovolemic at admission.  His diuretics and Entresto on hold due to renal failure.  Volume status seems to be stable currently.  History of ventricular tachycardia and atrial flutter On amiodarone.  He is status post ICD placement.  History of diabetes mellitus type 2 on insulin Lantus being continued.  Monitor CBGs.  HbA1c 5.9 in November.  CBGs are reasonably well controlled.  Hypothyroidism TSH was normal at 2.69.  Free T4 noted to be mildly elevated at 1.6.  No clinical signs of hyperthyroidism noted.  Recommend rechecking this in the outpatient setting.  Will not change his Synthroid dose at this time.  History of anxiety and insomnia Continue home medications.  History of COPD Respiratory status is stable.  Normocytic anemia Hemoglobin is stable.  No evidence for overt bleeding.  Constipation Significant stool burden noted on CT scan.  Continue laxatives.  Obesity Estimated body mass index is 33.15 kg/m as calculated from the following:   Height as of this encounter: 5\' 8"  (1.727 m).   Weight as of this encounter: 98.9 kg.   DVT Prophylaxis: Lovenox Code Status: Full code Family Communication: Discussed with the patient Disposition Plan: Possibly to CIR when improved  Status is: Inpatient  Remains inpatient appropriate because:Unsafe d/c plan and Inpatient level of care appropriate due to severity of illness   Dispo: The patient is  from: Home              Anticipated d/c is to: To be determined              Anticipated d/c date is: 2 days              Patient currently is not medically stable to d/c.      Medications:  Scheduled: . amiodarone  200 mg Oral Daily  . vitamin C  1,000 mg Oral BID  . Chlorhexidine Gluconate Cloth  6 each Topical Daily  . docusate sodium  100 mg Oral BID  . insulin aspart  0-9 Units Subcutaneous TID WC  . insulin glargine  5 Units Subcutaneous QHS  . levothyroxine  88 mcg Oral QAC breakfast  . lidocaine  1 patch Transdermal Q24H  . lidocaine (PF)  5 mL Intradermal Once  . lidocaine  1 application Urethral Once  . melatonin  6 mg Oral QHS  . metoprolol succinate  12.5 mg Oral Daily  . multivitamin with minerals  1 tablet Oral Daily  . polyethylene glycol  17 g Oral Daily  . tamsulosin  0.4 mg Oral QHS  .  Vitamin D (Ergocalciferol)  50,000 Units Oral Q Mon   Continuous:  IRS:[id number], albuterol, bisacodyl, cyclobenzaprine, LORazepam, morphine injection, oxyCODONE   Objective:  Vital Signs  Vitals:   03/15/20 1946 03/16/20 0031 03/16/20 0522 03/16/20 0853  BP: (!) 114/59 (!) 123/59 (!) 154/67 137/71  Pulse: 69 60 64 73  Resp: 18 18  18   Temp: 98.4 F (36.9 C) 99.1 F (37.3 C) 98.6 F (37 C) 97.8 F (36.6 C)  TempSrc: Oral Oral Oral Oral  SpO2: 97% 98% 98% 97%  Weight:      Height:        Intake/Output Summary (Last 24 hours) at 03/16/2020 0917 Last data filed at 03/15/2020 0956 Gross per 24 hour  Intake --  Output 1050 ml  Net -1050 ml   Filed Weights   03/07/2020 1823  Weight: 98.9 kg    General appearance: Awake alert.  In no distress Resp: Clear to auscultation bilaterally.  Normal effort Cardio: S1-S2 is normal regular.  No S3-S4.  No rubs murmurs or bruit GI: Abdomen is soft.  Nontender nondistended.  Bowel sounds are present normal.  No masses organomegaly Extremities: No edema.   Neurologic: Alert and oriented x3.  Motor strength deficits  noted in the lower extremities.  Able to move his arms.  Has diminished strength in his hands.    Lab Results:  Data Reviewed: I have personally reviewed following labs and imaging studies  CBC: Recent Labs  Lab 04/01/2020 1320 03/15/20 0303 03/16/20 0410  WBC 9.8 8.3 6.5  HGB 11.4* 11.7* 10.7*  HCT 36.2* 36.8* 32.1*  MCV 89.4 87.8 87.2  PLT 336 344 818    Basic Metabolic Panel: Recent Labs  Lab 03/26/2020 1320 03/15/20 0303 03/16/20 0410  NA 137 140 141  K 3.8 3.5 3.4*  CL 103 104 107  CO2 23 22 23   GLUCOSE 134* 137* 129*  BUN 40* 35* 29*  CREATININE 1.80* 1.48* 1.38*  CALCIUM 9.2 9.3 9.2    GFR: Estimated Creatinine Clearance: 58.4 mL/min (A) (by C-G formula based on SCr of 1.38 mg/dL (H)).  Liver Function Tests: Recent Labs  Lab 03/10/2020 2114 03/15/20 0303 03/16/20 0410  AST 102* 111* 77*  ALT 230* 239* 174*  ALKPHOS 179* 172* 145*  BILITOT 0.7 0.8 0.8  PROT 7.0 6.8 5.9*  ALBUMIN 3.1* 3.2* 2.7*     Recent Labs  Lab 03/20/2020 2114  AMMONIA 11    Cardiac Enzymes: Recent Labs  Lab 03/15/20 0303  CKTOTAL 49    BNP (last 3 results) Recent Labs    01/13/20 1403  PROBNP 1,199*    CBG: Recent Labs  Lab 03/27/2020 1800 03/15/20 0225 03/15/20 1243 03/15/20 1552 03/16/20 0613  GLUCAP 127* 117* 152* 173* 122*     Thyroid Function Tests: Recent Labs    03/15/20 0303  TSH 2.699  FREET4 1.63*    Anemia Panel: Recent Labs    03/15/20 0303  VITAMINB12 1,421*    Recent Results (from the past 240 hour(s))  Resp Panel by RT-PCR (Flu A&B, Covid) Nasopharyngeal Swab     Status: None   Collection Time: 04/01/2020  8:37 PM   Specimen: Nasopharyngeal Swab; Nasopharyngeal(NP) swabs in vial transport medium  Result Value Ref Range Status   SARS Coronavirus 2 by RT PCR NEGATIVE NEGATIVE Final    Comment: (NOTE) SARS-CoV-2 target nucleic acids are NOT DETECTED.  The SARS-CoV-2 RNA is generally detectable in upper respiratory specimens  during the acute phase of infection. The  lowest concentration of SARS-CoV-2 viral copies this assay can detect is 138 copies/mL. A negative result does not preclude SARS-Cov-2 infection and should not be used as the sole basis for treatment or other patient management decisions. A negative result may occur with  improper specimen collection/handling, submission of specimen other than nasopharyngeal swab, presence of viral mutation(s) within the areas targeted by this assay, and inadequate number of viral copies(<138 copies/mL). A negative result must be combined with clinical observations, patient history, and epidemiological information. The expected result is Negative.  Fact Sheet for Patients:  EntrepreneurPulse.com.au  Fact Sheet for Healthcare Providers:  IncredibleEmployment.be  This test is no t yet approved or cleared by the [place] FDA and  has been authorized for detection and/or diagnosis of SARS-CoV-2 by FDA under an Emergency Use Authorization (EUA). This EUA will remain  in effect (meaning this test can be used) for the duration of the COVID-19 declaration under Section 564(b)(1) of the Act, 21 U.S.C.section 360bbb-3(b)(1), unless the authorization is terminated  or revoked sooner.       Influenza A by PCR NEGATIVE NEGATIVE Final   Influenza B by PCR NEGATIVE NEGATIVE Final    Comment: (NOTE) The Xpert Xpress SARS-CoV-2/FLU/RSV plus assay is intended as an aid in the diagnosis of influenza from Nasopharyngeal swab specimens and should not be used as a sole basis for treatment. Nasal washings and aspirates are unacceptable for Xpert Xpress SARS-CoV-2/FLU/RSV testing.  Fact Sheet for Patients: EntrepreneurPulse.com.au  Fact Sheet for Healthcare Providers: IncredibleEmployment.be  This test is not yet approved or cleared by the [place] FDA and has been authorized for detection  and/or diagnosis of SARS-CoV-2 by FDA under an Emergency Use Authorization (EUA). This EUA will remain in effect (meaning this test can be used) for the duration of the COVID-19 declaration under Section 564(b)(1) of the Act, 21 U.S.C. section 360bbb-3(b)(1), unless the authorization is terminated or revoked.  Performed at [facility], [place] [address] [place], [place] [address]   Culture, blood (routine x 2)     Status: None (Preliminary result)   Collection Time: 03/15/20  2:48 AM   Specimen: BLOOD RIGHT WRIST  Result Value Ref Range Status   Specimen Description BLOOD RIGHT WRIST  Final   Special Requests   Final    BOTTLES DRAWN AEROBIC AND ANAEROBIC Blood Culture adequate volume   Culture   Final    NO GROWTH < 12 HOURS Performed at [facility], [place] [address] [place], [place] [address]    Report Status PENDING  Incomplete  Culture, blood (routine x 2)     Status: None (Preliminary result)   Collection Time: 03/15/20  3:11 AM   Specimen: BLOOD LEFT FOREARM  Result Value Ref Range Status   Specimen Description BLOOD LEFT FOREARM  Final   Special Requests   Final    BOTTLES DRAWN AEROBIC AND ANAEROBIC Blood Culture adequate volume   Culture   Final    NO GROWTH < 12 HOURS Performed at [facility], [place] [address], [place], [place] [address]    Report Status PENDING  Incomplete      Radiology Studies: CT Head Wo Contrast  Result Date: 03/27/2020 CLINICAL DATA:  Awoke with bilateral leg weakness and numbness. EXAM: CT HEAD WITHOUT CONTRAST TECHNIQUE: Contiguous axial images were obtained from the base of the skull through the vertex without intravenous contrast. COMPARISON:  Head CT 06/25/2019 FINDINGS: Brain: Brain volume is normal for age. No  intracranial hemorrhage, mass effect, or midline shift. No hydrocephalus. The basilar cisterns are patent. No evidence of territorial infarct or acute ischemia. No extra-axial or intracranial fluid collection.  Vascular: Atherosclerosis of skullbase vasculature without hyperdense vessel or abnormal calcification. Skull: No fracture or focal lesion. Sinuses/Orbits: No acute findings. Bilateral cataract resection. Chronic opacification of right mastoid air cells with slight improvement from prior exam. Chronic opacification of lower left mastoid air cells. Other: None. IMPRESSION: 1. No acute intracranial abnormality. 2. Chronic opacification of right mastoid air cells with slight improvement from prior exam. Chronic opacification of lower left mastoid air cells. Electronically Signed   By: Keith Rake M.D.   On: 03/22/2020 20:38   CT L-SPINE NO CHARGE  Result Date: 03/24/2020 CLINICAL DATA:  Back pain x6 weeks. Cauda carina syndrome. Unable to walk. Recent laminectomy. EXAM: CT ABDOMEN AND PELVIS WITHOUT CONTRAST CT LUMBAR SPINE WITHOUT CONTRAST TECHNIQUE: Multidetector CT imaging of the abdomen and pelvis was performed following the standard protocol without IV contrast. Multiplanar CT images of the lumbar spine were reconstructed from contemporary CT of the Abdomen, and Pelvis COMPARISON:  MRI lumbar spine dated 12/13/2019. CT L-spine dated June 28, 2019. FINDINGS: Lower chest: There is atelectasis at the lung bases.The heart size is normal. Hepatobiliary: The liver is normal. There is gallbladder sludge without CT evidence for acute cholecystitis.There is no biliary ductal dilation. Pancreas: Normal contours without ductal dilatation. No peripancreatic fluid collection. Spleen: Unremarkable. Adrenals/Urinary Tract: --Adrenal glands: Unremarkable. --Right kidney/ureter: Patient is status post prior right-sided nephrectomy. --Left kidney/ureter: No hydronephrosis or radiopaque kidney stones. --Urinary bladder: Unremarkable. Stomach/Bowel: --Stomach/Duodenum: No hiatal hernia or other gastric abnormality. Normal duodenal course and caliber. --Small bowel: Unremarkable. --Colon: There is a moderate amount of stool  in the rectum. --Appendix: Normal. Vascular/Lymphatic: Atherosclerotic calcification is present within the non-aneurysmal abdominal aorta, without hemodynamically significant stenosis. --No retroperitoneal lymphadenopathy. --No mesenteric lymphadenopathy. --No pelvic or inguinal lymphadenopathy. Reproductive: There is significant prostatomegaly. Other: No ascites or free air. There are bilateral fat containing inguinal hernias. Musculoskeletal. The patient is status post prior L3 through L5 posterior fusion with interbody spacers at the L3-L4 and L4-L5 levels. The patient is status post prior L3-L4 laminectomy. Evaluation of the lower lumbar spine is limited by streak artifact from the adjacent metallic hardware. There is moderate disc height loss at the L5-S1 level. There is probable moderate right-sided osseous neural foraminal narrowing. There is no acute compression fracture. IMPRESSION: 1. No CT evidence for acute intra-abdominal or intrapelvic pathology. 2. Status post prior right-sided nephrectomy. 3. Moderate amount of stool in the rectum. 4. Marked prostatomegaly. 5. Bilateral fat containing inguinal hernias. 6. The patient is status post prior L3 through L5 posterior fusion and decompression with multilevel interbody spacers. The hardware is intact. There is persistent at least moderate neural foraminal stenosis at the L5 level on the right. There is no acute compression fracture. Evaluation of the spinal canal is significantly limited by metallic streak artifact. Aortic Atherosclerosis (ICD10-I70.0). Electronically Signed   By: Constance Holster M.D.   On: 03/30/2020 20:40   DG Chest Port 1 View  Result Date: 03/20/2020 CLINICAL DATA:  Generalized weakness. Leg weakness. EXAM: PORTABLE CHEST 1 VIEW COMPARISON:  08/28/2019 FINDINGS: Left-sided pacemaker in place. Post median sternotomy. Stable mild cardiomegaly. Unchanged mediastinal contours. Aortic atherosclerosis. Minimal subsegmental atelectasis  or scarring at the left lung base. No new or progressive airspace disease. No pleural fluid or pneumothorax. No pulmonary edema. No acute osseous abnormalities are seen. IMPRESSION: Stable  mild cardiomegaly. No acute abnormality. Aortic Atherosclerosis (ICD10-I70.0). Electronically Signed   By: Keith Rake M.D.   On: 03/05/2020 18:35   CT Renal Stone Study  Result Date: 03/12/2020 CLINICAL DATA:  Back pain x6 weeks. Cauda carina syndrome. Unable to walk. Recent laminectomy. EXAM: CT ABDOMEN AND PELVIS WITHOUT CONTRAST CT LUMBAR SPINE WITHOUT CONTRAST TECHNIQUE: Multidetector CT imaging of the abdomen and pelvis was performed following the standard protocol without IV contrast. Multiplanar CT images of the lumbar spine were reconstructed from contemporary CT of the Abdomen, and Pelvis COMPARISON:  MRI lumbar spine dated 12/13/2019. CT L-spine dated June 28, 2019. FINDINGS: Lower chest: There is atelectasis at the lung bases.The heart size is normal. Hepatobiliary: The liver is normal. There is gallbladder sludge without CT evidence for acute cholecystitis.There is no biliary ductal dilation. Pancreas: Normal contours without ductal dilatation. No peripancreatic fluid collection. Spleen: Unremarkable. Adrenals/Urinary Tract: --Adrenal glands: Unremarkable. --Right kidney/ureter: Patient is status post prior right-sided nephrectomy. --Left kidney/ureter: No hydronephrosis or radiopaque kidney stones. --Urinary bladder: Unremarkable. Stomach/Bowel: --Stomach/Duodenum: No hiatal hernia or other gastric abnormality. Normal duodenal course and caliber. --Small bowel: Unremarkable. --Colon: There is a moderate amount of stool in the rectum. --Appendix: Normal. Vascular/Lymphatic: Atherosclerotic calcification is present within the non-aneurysmal abdominal aorta, without hemodynamically significant stenosis. --No retroperitoneal lymphadenopathy. --No mesenteric lymphadenopathy. --No pelvic or inguinal  lymphadenopathy. Reproductive: There is significant prostatomegaly. Other: No ascites or free air. There are bilateral fat containing inguinal hernias. Musculoskeletal. The patient is status post prior L3 through L5 posterior fusion with interbody spacers at the L3-L4 and L4-L5 levels. The patient is status post prior L3-L4 laminectomy. Evaluation of the lower lumbar spine is limited by streak artifact from the adjacent metallic hardware. There is moderate disc height loss at the L5-S1 level. There is probable moderate right-sided osseous neural foraminal narrowing. There is no acute compression fracture. IMPRESSION: 1. No CT evidence for acute intra-abdominal or intrapelvic pathology. 2. Status post prior right-sided nephrectomy. 3. Moderate amount of stool in the rectum. 4. Marked prostatomegaly. 5. Bilateral fat containing inguinal hernias. 6. The patient is status post prior L3 through L5 posterior fusion and decompression with multilevel interbody spacers. The hardware is intact. There is persistent at least moderate neural foraminal stenosis at the L5 level on the right. There is no acute compression fracture. Evaluation of the spinal canal is significantly limited by metallic streak artifact. Aortic Atherosclerosis (ICD10-I70.0). Electronically Signed   By: Constance Holster M.D.   On: 03/12/2020 20:40       LOS: 2 days   [facility] Pager on [email or web address]  03/16/2020, 9:17 AM

## 2020-03-16 NOTE — Progress Notes (Signed)
Neurology Progress Note  Patient ID: James [name]. is a 69 y.o. with PMHx of  has a past medical history of Abnormal nuclear stress test (11/03/2017), Benign hypertension with chronic kidney disease, stage III ([place]) (04/11/2018), CAD in native artery (7/62/2633), Chronic systolic congestive heart failure ([place]) (10/26/2017), CKD stage 3 due to type 1 diabetes mellitus ([place]) (08/28/2014), COPD GOLD II (10/17/2014), DM (diabetes mellitus) ([place]), Dyspnea (09/05/2014), Essential hypertension (09/11/2014), Hyperlipidemia, Hypertension, Hypertensive heart disease with congestive heart failure ([place]) (08/27/2014), ICD (implantable cardioverter-defibrillator) in place (3/54/5625), Metabolic bone disease (63/89/3734), NSTEMI (non-ST elevated myocardial infarction) ([place]) (02/12/2014), Obesity (10/18/2014), On amiodarone therapy (08/27/2014), Renal oncocytoma of right kidney (01/29/2018), S/P ICD (internal cardiac defibrillator) procedure (02/15/2014), Typical atrial flutter ([place]) (08/27/2014), Ventricular tachyarrhythmia ([place]) (08/27/2014), and Vitamin D deficiency (01/17/2018). Most notably he had lumbar laminectomy & fusion of L3-L5 in Nov and has been doing inpatient rehab and had just d/c from there to home. He was treated for an UTI after his rehab stay by home health/ On Thr morning 12/9, the pt noted numbness bilat feet to ankles and bilat fingers up to wrists. He has no longer been able to stand or walk as he can't feel the floor beneath him. He does have some neuropathy at baseline, but intact reflexes noted on previous out pt neurology visit one month ago.   The pt is now areflexic, c/f GBS vs. Cervical myleopathy, less likely infectiously process    Major interval events:  - Did not tolerate LP this morning due to shoulder pain  - NIF/FVC stable at -35 to -40 and ~1.3-1.5 L   Subjective: - Denies shortness of breath   Exam: Vitals:   03/16/20 0031 03/16/20 0522  BP: (!) 123/59 (!) 154/67  Pulse: 60 64  Resp:  18   Temp: 99.1 F (37.3 C) 98.6 F (37 C)  SpO2: 98% 98%   General - debilitated, mild-mod distress Heart - Regular rate and rhythm - no murmer Lungs - Comfortable on room air Abdomen - Soft - non tender, obese  Extremities - Distal pulses intact - mild edema Skin - Buttocks abrasion on the right  Neurologic Examination:   Mental Status:  Alert, oriented, thought content appropriate. Speech without evidence of dysarthria or aphasia.  Cranial Nerves:  II-bilateral visual fields intact III/IV/VI- Extraocular movements were full.  V/VII-no facial numbness and no facial weakness.  VIII-hearing normal.  X-normal speech XII-midline tongue extension  Motor: L/R Bilat UE: delt 3/4 (pain limited), biceps 4/4+, triceps 4-/4, wrist ext 3/3, finger flexion 3/3 and finger extension 3/3 Bilat LE: hip flex 2/2, knee felx 2/2, knee ext 2/2, ankle flex 2/4-, ankle ext 4/4+ Sensory: decreased distally toes to ankle and fingers to wrists. Vibration 6 seconds at the right knee  Deep Tendon Reflexes: 0/4 throughout Cerebellar: unable due to weakness  Gait: not tested   Pertinent Labs: Last metabolic panel Lab Results  Component Value Date   GLUCOSE 129 (H) 03/16/2020   NA 141 03/16/2020   K 3.4 (L) 03/16/2020   CL 107 03/16/2020   CO2 23 03/16/2020   BUN 29 (H) 03/16/2020   CREATININE 1.38 (H) 03/16/2020   GFRNONAA 56 (L) 03/16/2020   GFRAA 56 (L) 01/13/2020   CALCIUM 9.2 03/16/2020   PROT 5.9 (L) 03/16/2020   ALBUMIN 2.7 (L) 03/16/2020   LABGLOB 2.2 01/13/2020   AGRATIO 2.1 01/13/2020   BILITOT 0.8 03/16/2020   ALKPHOS 145 (H) 03/16/2020   AST 77 (H) 03/16/2020  ALT 174 (H) 03/16/2020   ANIONGAP 11 03/16/2020   Cr improving from 1.48 prior   AST/ALT/Alk phos chornically elevated   Impression: This is a 69 year old male with a past medical history significant for congestive heart failure s/p AICD, coronary artery disease, diabetes, hypertension, hyperlipidemia, type 1  diabetes C/B neuropathy, obesity, atrial flutter, presenting with sensory loss in the hands and feet accompanied with weakness as noted above.  GBS remains in the differential but her compressive cervical spine process as well as infectious processes need to be excluded.  I will continue with planned work-up as initiated by Dr. Leonel Ramsay.  Unfortunately patient did not tolerate LP this morning due to bilateral shoulder pain and right hip pain.  We will give some additional pain medications and reattempt this procedure today  Recommendations: - MRI C-spine at 1 PM today - LP to follow - oxycodone 10 mg once, flexeril PRN as ordered previously, ativan 0.5 mg as ordered previously, and 15 mg ketorolac once to be given concurrently for pain control for studies - ESR / CRP  Lesleigh Noe MD-PhD [facility] [phone]

## 2020-03-17 LAB — GLUCOSE, CAPILLARY
Glucose-Capillary: 124 mg/dL — ABNORMAL HIGH (ref 70–99)
Glucose-Capillary: 182 mg/dL — ABNORMAL HIGH (ref 70–99)
Glucose-Capillary: 177 mg/dL — ABNORMAL HIGH (ref 70–99)
Glucose-Capillary: 157 mg/dL — ABNORMAL HIGH (ref 70–99)

## 2020-03-17 LAB — COMPREHENSIVE METABOLIC PANEL
ALT: 161 U/L — ABNORMAL HIGH (ref 0–44)
AST: 72 U/L — ABNORMAL HIGH (ref 15–41)
Albumin: 2.9 g/dL — ABNORMAL LOW (ref 3.5–5.0)
Alkaline Phosphatase: 146 U/L — ABNORMAL HIGH (ref 38–126)
Anion gap: 10 (ref 5–15)
BUN: 30 mg/dL — ABNORMAL HIGH (ref 8–23)
CO2: 23 mmol/L (ref 22–32)
Calcium: 9.3 mg/dL (ref 8.9–10.3)
Chloride: 107 mmol/L (ref 98–111)
Creatinine, Ser: 1.39 mg/dL — ABNORMAL HIGH (ref 0.61–1.24)
GFR, Estimated: 55 mL/min — ABNORMAL LOW (ref >60)
Glucose, Bld: 138 mg/dL — ABNORMAL HIGH (ref 70–99)
Potassium: 4 mmol/L (ref 3.5–5.1)
Sodium: 140 mmol/L (ref 135–145)
Total Bilirubin: 0.6 mg/dL (ref 0.3–1.2)
Total Protein: 6.3 g/dL — ABNORMAL LOW (ref 6.5–8.1)

## 2020-03-17 LAB — CBC
HCT: 33 % — ABNORMAL LOW (ref 39.0–52.0)
Hemoglobin: 11 g/dL — ABNORMAL LOW (ref 13.0–17.0)
MCH: 29.1 pg (ref 26.0–34.0)
MCHC: 33.3 g/dL (ref 30.0–36.0)
MCV: 87.3 fL (ref 80.0–100.0)
Platelets: 333 10*3/uL (ref 150–400)
RBC: 3.78 MIL/uL — ABNORMAL LOW (ref 4.22–5.81)
RDW: 14.6 % (ref 11.5–15.5)
WBC: 6.4 10*3/uL (ref 4.0–10.5)
nRBC: 0 % (ref 0.0–0.2)

## 2020-03-17 LAB — HIV ANTIBODY (ROUTINE TESTING W REFLEX): HIV Screen 4th Generation wRfx: NONREACTIVE

## 2020-03-17 MED ORDER — SACUBITRIL-VALSARTAN 49-51 MG PO TABS
1.0000 | ORAL_TABLET | Freq: Two times a day (BID) | ORAL | Status: DC
  Administered 2020-03-17 – 2020-03-20 (×8): 1 via ORAL
  Filled 2020-03-17 (×9): qty 1

## 2020-03-17 NOTE — Progress Notes (Signed)
Physical Therapy Treatment Patient Details Name: James [name]. MRN: [id number] DOB: 01-Mar-1951 Today's Date: 03/17/2020    History of Present Illness Pt is a 69 year old male who presented with subacute progressive generalized weakness following his recent surgery in November 2021 in which he received a laminectomy and spinal fusion of L3-5 for lumbar spinal stenosis. He reported feeling like he was doing well initial 2 weeks in inpatient rehab following the surgery but then started to decline prior to discharge. He reported R leg weakness > L, similar to presentation prior to surgery. Since surgery pt has also had a UTI and a new sacral decubitus ulcer. CT of head negative for acute intracranial abnormalities. CT of abdomen negative for acute intra-abdominal or intrapelvic pathology and showing intact L3-5 surgical hardware and no acute compression fracture. There is persistent at least moderate neural foraminal stenosis at the L5 level on the R, per CT report. Neurology consulted and pt being worked up for GBS.  PMH: ventricular trachyarrhythmia, typical atrial flutter, s/p ICD, renal oncocytoma of R kidney, obese, SNTEMI, metabolic bone disease, HTN, dyspnea, DM, COPD GOLD II, CKD stage 3, CHF, CAD, and localized prostate CA.    PT Comments    Pt seen by PT and OT to maximize functional mobility. Transferred bed to recliner with sliding board and +3 max A to R. Pt able to use RUE and LLE to assist in transfer. Pt worked on sitting balance EOB prior to transfer as pt has decreased core stability as well as deficits in spatial awareness. Pt reports slightly decreased back pain in recliner compared to bed before session. Encouraged to sit up one hour before back to bed with maximove. Discussed with RN after session. PT will continue to follow.   Follow Up Recommendations  CIR;Supervision/Assistance - 24 hour     Equipment Recommendations  Hospital bed;Wheelchair (measurements PT);Wheelchair  cushion (measurements PT);Other (comment) (mechanical lift)    Recommendations for Other Services Rehab consult     Precautions / Restrictions Precautions Precautions: Back;Fall;Other (comment) (sacral wound, RN aware) Precaution Booklet Issued: No Precaution Comments: Reviewed spinal precautions. Required Braces or Orthoses: Spinal Brace Spinal Brace: Lumbar corset;Applied in sitting position Restrictions Weight Bearing Restrictions: No    Mobility  Bed Mobility Overal bed mobility: Needs Assistance Bed Mobility: Rolling;Sidelying to Sit Rolling: Max assist;+2 for physical assistance Sidelying to sit: Max assist;+2 for physical assistance;HOB elevated       General bed mobility comments: HOB flat. Cues for log rolling technique, hand placement and initiation with use of bed rail. Max A +2 at BLE and trunk.  Transfers Overall transfer level: Needs assistance Equipment used: Sliding board Transfers: Lateral/Scoot Transfers          Lateral/Scoot Transfers: With slide board;Max assist;+2 physical assistance (+3 for assist with SB) General transfer comment: Lateral scoot to R with +3 assist and multimodal cues for anterior shift, foot/hand placement, and use of transfer board with Max A for placement. Multiple attempts to complete transfer 2/2 generalized weakness. Max A +2 for posterior scoot in recliner.  Ambulation/Gait             General Gait Details: Unable   Stairs             Wheelchair Mobility    Modified Rankin (Stroke Patients Only) Modified Rankin (Stroke Patients Only) Pre-Morbid Rankin Score: Moderately severe disability Modified Rankin: Severe disability     Balance Overall balance assessment: Needs assistance Sitting-balance support: Bilateral upper extremity supported;Feet  supported Sitting balance-Leahy Scale: Poor Sitting balance - Comments: Min guard at best and Max A at most to maintain static sitting balance at EOB. Standing  mirror in place for visual feedback. Patient able to bear weight on bilateral elbows and lean forward in prep for LB ADLs with adherence to back precautions. Worked on sitting balance activities prior to transfer to promote increased trunk control with tx Postural control: Right lateral lean (mostly R but occasionally L)     Standing balance comment: Unable                            Cognition Arousal/Alertness: Awake/alert Behavior During Therapy: WFL for tasks assessed/performed Overall Cognitive Status: Impaired/Different from baseline Area of Impairment: Attention;Awareness;Problem solving                   Current Attention Level: Sustained       Awareness: Emergent Problem Solving: Slow processing;Decreased initiation;Requires verbal cues;Requires tactile cues General Comments: pt with decreased awareness of deficits as well as slow processing. Pt's daughter relays difficulty helping him understood why tests are needed to figure out what's going on with him.      Exercises      General Comments General comments (skin integrity, edema, etc.): Daughter relays concern about sacral wound, relayed to RN, as well as decreased function LUE      Pertinent Vitals/Pain Pain Assessment: 0-10 Pain Score: 7  Pain Location: back Pain Descriptors / Indicators: Aching;Sharp;Constant Pain Intervention(s): Limited activity within patient's tolerance;Monitored during session;Patient requesting pain meds-RN notified    Home Living                      Prior Function            PT Goals (current goals can now be found in the care plan section) Acute Rehab PT Goals Patient Stated Goal: to figure out what is wrong PT Goal Formulation: With patient/family Time For Goal Achievement: 03/29/20 Potential to Achieve Goals: Fair Progress towards PT goals: Progressing toward goals    Frequency    Min 4X/week      PT Plan Current plan remains appropriate     Co-evaluation PT/OT/SLP Co-Evaluation/Treatment: Yes Reason for Co-Treatment: Complexity of the patient's impairments (multi-system involvement);Necessary to address cognition/behavior during functional activity;For patient/therapist safety PT goals addressed during session: Mobility/safety with mobility;Balance;Proper use of DME;Strengthening/ROM OT goals addressed during session: Proper use of Adaptive equipment and DME;Strengthening/ROM      AM-PAC PT "6 Clicks" Mobility   Outcome Measure  Help needed turning from your back to your side while in a flat bed without using bedrails?: A Lot Help needed moving from lying on your back to sitting on the side of a flat bed without using bedrails?: A Lot Help needed moving to and from a bed to a chair (including a wheelchair)?: A Lot Help needed standing up from a chair using your arms (e.g., wheelchair or bedside chair)?: Total Help needed to walk in hospital room?: Total Help needed climbing 3-5 steps with a railing? : Total 6 Click Score: 9    End of Session Equipment Utilized During Treatment: Gait belt;Back brace Activity Tolerance: Patient limited by fatigue Patient left: in chair;with call bell/phone within reach;with chair alarm set;with family/visitor present Nurse Communication: Mobility status;Need for lift equipment PT Visit Diagnosis: Unsteadiness on feet (R26.81);Muscle weakness (generalized) (M62.81);Difficulty in walking, not elsewhere classified (R26.2);Other symptoms and signs involving  the nervous system (R29.898);Pain Pain - part of body:  (back)     Time: 1127-1202 PT Time Calculation (min) (ACUTE ONLY): 35 min  Charges:  $Therapeutic Activity: 8-22 mins                     Leighton Roach, [place]  Pager [phone] Office [place] 03/17/2020, 1:31 PM

## 2020-03-17 NOTE — Progress Notes (Addendum)
Attempted to meet with the patient but he was in too much pain to talk about d/c planning. CM will attempt to see him later today.   1630: per daughter they are not sure of d/c plan at this time and are requesting a second opinion. CM has updated the MD.

## 2020-03-17 NOTE — Progress Notes (Signed)
NIF -40 VC 1.3L   Pt performed with great effort.

## 2020-03-17 NOTE — Progress Notes (Addendum)
[facility] PROGRESS NOTE   James [name]. [name]:[id number] DOB: Sep 17, 1950 DOA: 03/05/2020  PCP: [name], MD  Brief History/Interval Summary:  69 y.o. male with hx significant for CAD s/p CABG, hx of AFL and VT s/p ICD, CKD stage 3, DMT2 on insulin, localized prostate CA, hx of R renal oncocytoma s/p R nephrectomy who had laminectomy and spinal fusion of L3-L5 in November for lumbar spinal stenosis and has been having subacute progressive generalized weakness since surgery.  Patient was admitted to rehab on 02/13/20 and discharged on 03/04/20 post back surgery.  Per patient and wife, patient was doing well in inpatient rehab about 2 weeks into the program and actually felt that he was declining prior to discharge from rehab. Wife reported that patient was mostly sleeping all day while in rehab. He was also diagnosed with UTI (urine culture 11/27 grew Serratia) and treated with a  bactrim course. Patient relies on self straight catheterization due to urinary retention about 3 times a day (wife, caregiver, assists with caths).   Since arriving home, patient has basically been unable to get out of bed. Patient feels that his RLE is weaker than the left, which is similar to prior to the surgery -- but overall his stamina has significantly decreased over the past month. Wife also noted labile BP as low as 70/50s but then increasing back to 301-601U systolics.   Prior to surgery earlier this year, patient states that he was driving and able to perform all ADLs.     Reason for Visit: Physical deconditioning.  Urinary retention  Consultants:  Urology Neurology Neurosurgery  Procedures: Placement of 20 French coud Foley catheter  Antibiotics: Anti-infectives (From admission, onward)   None      Subjective/Interval History: Patient mentions that he is able to move his legs better than he was a few days ago.  Overall he feels a little stronger.  Still has limited mobility  of his upper extremities especially his fingers.     Assessment/Plan:  Weakness upper and lower extremity/concern for GBS/AIDP Patient noted to have weakness in his upper extremities as well.  The lower extremity weakness was attributed to his lumbar disease.  He was seen by neurosurgery.  He was subsequently seen by physical and Occupational Therapy.  There was no clear reason for his upper extremity weakness.  Neurology was subsequently consulted.   Initially there was concern for GBS/AIDP.  MRI cervical spine was done after his ICD was turned off.  Does not show any acute changes.  Chronic degenerative changes were noted.  No findings to explain his symptoms.  Patient's strength however has improved.   Reevaluated by neurology this morning.  They are able to elicit some reflexes.  Discussed with [name].  She thinks that likelihood of this being AIDP seems to be low at this time.  Patient was very reluctant to undergo LP yesterday.  Plan is to hold off on this for now.  Continue with PT and OT.    Recent L3-L5 lumbar laminectomy and spinal fusion in November 2021 Patient has right lower extremity weakness which is from prior to surgery.  Imaging studies did not show any acute findings.  Case was discussed with neurosurgery when patient was in the ER.  Patient was seen by [name].  No new recommendations at this time.  Pain medications.  Lidoderm patch.  Neurosurgery continues to follow.  Chronic kidney disease stage IIIb with history of right nephrectomy Baseline creatinine  around 1.5.  Presented with creatinine of 1.8.  Delene Loll was held.  Lasix was held.  Patient given gentle IV hydration.   Has improved.  Seems to be back to his baseline.  Urinary retention also contributed.     Acute urinary retention Patient with bladder outlet obstruction requiring self-catheterization at home.  On 12/11 night he was noted to have urinary retention.  Despite multiple attempts by nursing staff a  Foley catheter could not be placed.  A coud also could not be placed either.  Urology was consulted and they were able to place a 20 [place] coud catheter.  Tamsulosin to be continued.  Leave Foley in for now.  Patient needs to follow-up with his urology at [facility], [name].    Transaminitis Reason for this is not entirely clear.  Noted previously as well.  Hepatitis panel unremarkable.  Hepatic steatosis noted on previous imaging studies.  Patient is noted to be on amiodarone.  Dose was decreased.  Statin on hold currently.  LFTs are stable for the most part.  History of recent UTI UA was noted to be clear at this admission.  Recently completed course of Bactrim.  Hold off on antibiotics currently.  History of coronary artery disease status post CABG Stable.  No anginal symptoms.  Continue home medications.  Noted to be on Plavix and metoprolol.  Plavix was held due to possible need for lumbar puncture.  It appears now that LP is not going to be pursued.  If not we will resume Plavix from tomorrow.  History of ischemic cardiomyopathy/chronic systolic CHF EF known to be about 40%.  Was actually noted to be hypovolemic at admission.  His diuretics and Entresto were placed on hold due to renal failure.  Volume status seems to be stable.  Renal function is also stable.  We will resume his Entresto.  Consider resuming diuretics in the next 24 to 48 hours.    History of ventricular tachycardia and atrial flutter On amiodarone.  He is status post ICD placement.  History of diabetes mellitus type 2 on insulin Lantus being continued.  Monitor CBGs.  HbA1c 5.9 in November.  CBGs are reasonably well controlled.  Hypothyroidism TSH was normal at 2.69.  Free T4 noted to be mildly elevated at 1.6.  No clinical signs of hyperthyroidism noted.  Recommend rechecking this in the outpatient setting.  Will not change his Synthroid dose at this time.  History of anxiety and  insomnia Continue home medications.  History of COPD Respiratory status is stable.  Normocytic anemia Hemoglobin is stable.  No evidence for overt bleeding.  Constipation Significant stool burden noted on CT scan.  Stool output has been documented.  Continue laxatives.  Obesity Estimated body mass index is 33.15 kg/m as calculated from the following:   Height as of this encounter: 5\' 8"  (1.727 m).   Weight as of this encounter: 98.9 kg.   DVT Prophylaxis: Lovenox Code Status: Full code Family Communication: Discussed with the patient Disposition Plan: Inpatient rehabilitation recommended by PT and OT.  Patient does not want to return to the [facility] inpatient rehab.  TOC is consulted  Status is: Inpatient  Remains inpatient appropriate because:Unsafe d/c plan and Inpatient level of care appropriate due to severity of illness   Dispo: The patient is from: Home              Anticipated d/c is to: To be determined  Anticipated d/c date is: 2 days              Patient currently is not medically stable to d/c.      Medications:  Scheduled: . amiodarone  200 mg Oral Daily  . vitamin C  1,000 mg Oral BID  . Chlorhexidine Gluconate Cloth  6 each Topical Daily  . docusate sodium  100 mg Oral BID  . feeding supplement  237 mL Oral TID BM  . insulin aspart  0-9 Units Subcutaneous TID WC  . insulin glargine  5 Units Subcutaneous QHS  . levothyroxine  88 mcg Oral QAC breakfast  . lidocaine  1 patch Transdermal Q24H  . lidocaine (PF)  5 mL Intradermal Once  . lidocaine  1 application Urethral Once  . melatonin  6 mg Oral QHS  . metoprolol succinate  12.5 mg Oral Daily  . multivitamin with minerals  1 tablet Oral Daily  . multivitamin with minerals  1 tablet Oral Daily  . polyethylene glycol  17 g Oral Daily  . tamsulosin  0.4 mg Oral QHS  . Vitamin D (Ergocalciferol)  50,000 Units Oral Q Mon   Continuous:  VOH:[id number], albuterol, bisacodyl,  cyclobenzaprine, LORazepam, morphine injection, oxyCODONE   Objective:  Vital Signs  Vitals:   03/16/20 2028 03/16/20 2320 03/17/20 0340 03/17/20 0751  BP: (!) 114/53 (!) 114/56 130/64 (!) 168/75  Pulse: 69 64 64 71  Resp: 17 17 17 18   Temp: 97.7 F (36.5 C) 97.8 F (36.6 C) 97.8 F (36.6 C) 98 F (36.7 C)  TempSrc: Oral Oral Oral Oral  SpO2: 97% 97% 97% 97%  Weight:      Height:        Intake/Output Summary (Last 24 hours) at 03/17/2020 1029 Last data filed at 03/17/2020 0552 Gross per 24 hour  Intake 400 ml  Output 1320 ml  Net -920 ml   Filed Weights   03/15/2020 1823  Weight: 98.9 kg    General appearance: Awake alert.  In no distress Resp: Clear to auscultation bilaterally.  Normal effort Cardio: S1-S2 is normal regular.  No S3-S4.  No rubs murmurs or bruit GI: Abdomen is soft.  Nontender nondistended.  Bowel sounds are present normal.  No masses organomegaly Extremities: No edema. Neurologic: Improved strength in bilateral lower extremities.  Still has difficulty maneuvering and moving his fingers    Lab Results:  Data Reviewed: I have personally reviewed following labs and imaging studies  CBC: Recent Labs  Lab 03/17/2020 1320 03/15/20 0303 03/16/20 0410 03/17/20 0735  WBC 9.8 8.3 6.5 6.4  HGB 11.4* 11.7* 10.7* 11.0*  HCT 36.2* 36.8* 32.1* 33.0*  MCV 89.4 87.8 87.2 87.3  PLT 336 344 329 546    Basic Metabolic Panel: Recent Labs  Lab 03/13/2020 1320 03/15/20 0303 03/16/20 0410 03/17/20 0735  NA 137 140 141 140  K 3.8 3.5 3.4* 4.0  CL 103 104 107 107  CO2 23 22 23 23   GLUCOSE 134* 137* 129* 138*  BUN 40* 35* 29* 30*  CREATININE 1.80* 1.48* 1.38* 1.39*  CALCIUM 9.2 9.3 9.2 9.3    GFR: Estimated Creatinine Clearance: 58 mL/min (A) (by C-G formula based on SCr of 1.39 mg/dL (H)).  Liver Function Tests: Recent Labs  Lab 04/01/2020 2114 03/15/20 0303 03/16/20 0410 03/17/20 0735  AST 102* 111* 77* 72*  ALT 230* 239* 174* 161*  ALKPHOS  179* 172* 145* 146*  BILITOT 0.7 0.8 0.8 0.6  PROT 7.0 6.8 5.9* 6.3*  ALBUMIN 3.1* 3.2* 2.7* 2.9*     Recent Labs  Lab 03/08/2020 2114  AMMONIA 11    Cardiac Enzymes: Recent Labs  Lab 03/15/20 0303  CKTOTAL 49    BNP (last 3 results) Recent Labs    01/13/20 1403  PROBNP 1,199*    CBG: Recent Labs  Lab 03/16/20 0613 03/16/20 1229 03/16/20 1627 03/16/20 2141 03/17/20 0659  GLUCAP 122* 135* 130* 180* 124*     Thyroid Function Tests: Recent Labs    03/15/20 0303  TSH 2.699  FREET4 1.63*    Anemia Panel: Recent Labs    03/15/20 0303  VITAMINB12 1,421*    Recent Results (from the past 240 hour(s))  Resp Panel by RT-PCR (Flu A&B, Covid) Nasopharyngeal Swab     Status: None   Collection Time: 03/27/2020  8:37 PM   Specimen: Nasopharyngeal Swab; Nasopharyngeal(NP) swabs in vial transport medium  Result Value Ref Range Status   SARS Coronavirus 2 by RT PCR NEGATIVE NEGATIVE Final    Comment: (NOTE) SARS-CoV-2 target nucleic acids are NOT DETECTED.  The SARS-CoV-2 RNA is generally detectable in upper respiratory specimens during the acute phase of infection. The lowest concentration of SARS-CoV-2 viral copies this assay can detect is 138 copies/mL. A negative result does not preclude SARS-Cov-2 infection and should not be used as the sole basis for treatment or other patient management decisions. A negative result may occur with  improper specimen collection/handling, submission of specimen other than nasopharyngeal swab, presence of viral mutation(s) within the areas targeted by this assay, and inadequate number of viral copies(<138 copies/mL). A negative result must be combined with clinical observations, patient history, and epidemiological information. The expected result is Negative.  Fact Sheet for Patients:  EntrepreneurPulse.com.au  Fact Sheet for Healthcare Providers:  IncredibleEmployment.be  This test is  no t yet approved or cleared by the [place] FDA and  has been authorized for detection and/or diagnosis of SARS-CoV-2 by FDA under an Emergency Use Authorization (EUA). This EUA will remain  in effect (meaning this test can be used) for the duration of the COVID-19 declaration under Section 564(b)(1) of the Act, 21 U.S.C.section 360bbb-3(b)(1), unless the authorization is terminated  or revoked sooner.       Influenza A by PCR NEGATIVE NEGATIVE Final   Influenza B by PCR NEGATIVE NEGATIVE Final    Comment: (NOTE) The Xpert Xpress SARS-CoV-2/FLU/RSV plus assay is intended as an aid in the diagnosis of influenza from Nasopharyngeal swab specimens and should not be used as a sole basis for treatment. Nasal washings and aspirates are unacceptable for Xpert Xpress SARS-CoV-2/FLU/RSV testing.  Fact Sheet for Patients: EntrepreneurPulse.com.au  Fact Sheet for Healthcare Providers: IncredibleEmployment.be  This test is not yet approved or cleared by the [place] FDA and has been authorized for detection and/or diagnosis of SARS-CoV-2 by FDA under an Emergency Use Authorization (EUA). This EUA will remain in effect (meaning this test can be used) for the duration of the COVID-19 declaration under Section 564(b)(1) of the Act, 21 U.S.C. section 360bbb-3(b)(1), unless the authorization is terminated or revoked.  Performed at [facility], [place] [address] [place], [place] [address]   Culture, blood (routine x 2)     Status: None (Preliminary result)   Collection Time: 03/15/20  2:48 AM   Specimen: BLOOD RIGHT WRIST  Result Value Ref Range Status   Specimen Description BLOOD RIGHT WRIST  Final   Special Requests   Final    BOTTLES DRAWN  AEROBIC AND ANAEROBIC Blood Culture adequate volume   Culture   Final    NO GROWTH 1 DAY Performed at [facility], [place] [address], [place], [place] [address]    Report Status PENDING   Incomplete  Culture, blood (routine x 2)     Status: None (Preliminary result)   Collection Time: 03/15/20  3:11 AM   Specimen: BLOOD LEFT FOREARM  Result Value Ref Range Status   Specimen Description BLOOD LEFT FOREARM  Final   Special Requests   Final    BOTTLES DRAWN AEROBIC AND ANAEROBIC Blood Culture adequate volume   Culture   Final    NO GROWTH 1 DAY Performed at [facility], [place] [address] [place], [place] [address]    Report Status PENDING  Incomplete      Radiology Studies: MR CERVICAL SPINE WO CONTRAST  Result Date: 03/16/2020 CLINICAL DATA:  Sensory loss in hands and feet with weakness EXAM: MRI CERVICAL SPINE WITHOUT CONTRAST TECHNIQUE: Multiplanar, multisequence MR imaging of the cervical spine was performed. No intravenous contrast was administered. COMPARISON:  None. FINDINGS: Motion artifact is present. Alignment: Trace retrolisthesis at C4-C5 and C5-C6. Vertebrae: Vertebral body heights are maintained. There is no substantial marrow edema. No suspicious osseous lesion. Cord: No abnormal signal. Posterior Fossa, vertebral arteries, paraspinal tissues: Unremarkable. Disc levels: C2-C3: Right uncovertebral hypertrophy and right greater than left facet hypertrophy. No canal stenosis. Marked right foraminal stenosis. Minor left foraminal stenosis. C3-C4: Disc bulge with endplate osteophytes and left greater than right uncovertebral and facet hypertrophy. No canal stenosis. Moderate to marked right and marked left foraminal stenosis. C4-C5: Disc bulge with endplate osteophytes and facet and uncovertebral hypertrophy. Mild to moderate canal stenosis. Marked, left greater than right foraminal stenosis. C5-C6: Disc bulge with endplate osteophytes and uncovertebral and facet hypertrophy. Moderate canal stenosis. Marked, left greater than right foraminal stenosis. C6-C7: Disc bulge with endplate osteophytes and uncovertebral hypertrophy. No canal stenosis. Marked foraminal  stenosis. C7-T1: Endplate osteophytes and facet hypertrophy. No canal or foraminal stenosis. IMPRESSION: No abnormal cord signal. Multilevel degenerative changes as detailed above with suboptimal stenosis evaluation due to motion artifact. Canal stenosis is greatest at C5-C6. Multilevel significant foraminal narrowing. Electronically Signed   By: Macy Mis M.D.   On: 03/16/2020 14:19       LOS: 3 days   [facility] Pager on [email or web address]  03/17/2020, 10:29 AM

## 2020-03-17 NOTE — Progress Notes (Signed)
Occupational Therapy Treatment Patient Details Name: James [name]. MRN: [id number] DOB: 01/22/1951 Today's Date: 03/17/2020    History of present illness Pt is a 69 year old male who presented with subacute progressive generalized weakness following his recent surgery in November 2021 in which he received a laminectomy and spinal fusion of L3-5 for lumbar spinal stenosis. He reported feeling like he was doing well initial 2 weeks in inpatient rehab following the surgery but then started to decline prior to discharge. He reported R leg weakness > L, similar to presentation prior to surgery. Since surgery pt has also had a UTI and a new sacral decubitus ulcer. CT of head negative for acute intracranial abnormalities. CT of abdomen negative for acute intra-abdominal or intrapelvic pathology and showing intact L3-5 surgical hardware and no acute compression fracture. There is persistent at least moderate neural foraminal stenosis at the L5 level on the R, per CT report. Neurology consulted and pt being worked up for GBS.  PMH: ventricular trachyarrhythmia, typical atrial flutter, s/p ICD, renal oncocytoma of R kidney, obese, SNTEMI, metabolic bone disease, HTN, dyspnea, DM, COPD GOLD II, CKD stage 3, CHF, CAD, and localized prostate CA.   OT comments  OT treatment session with focus on self-care re-education, bed mobility, static/dynamic sitting balance, and ADL transfers. Patient continues to be limited by pain in low back and at wound on sacrum and BUE weakness RUE<LUE. Patient continues to require Min guard at best and Max A at most to maintain sitting balance and EOB, +2 assist for bed mobility with adherence to spinal precautions, and up to 3 person assist for functional transfers with use of transfer board indicating need for continued acute OT services in prep for safe d/c to next level of care. Recommendation for CIR continues to remain appropriate.    Follow Up Recommendations   CIR;Supervision/Assistance - 24 hour    Equipment Recommendations  None recommended by OT    Recommendations for Other Services Rehab consult    Precautions / Restrictions Precautions Precautions: Back;Fall;Other (comment) (Sacral wound) Precaution Booklet Issued: No Precaution Comments: Reviewed spinal precautions. Required Braces or Orthoses: Spinal Brace Spinal Brace: Lumbar corset;Applied in sitting position Restrictions Weight Bearing Restrictions: No       Mobility Bed Mobility Overal bed mobility: Needs Assistance Bed Mobility: Rolling;Sidelying to Sit Rolling: Max assist;+2 for physical assistance Sidelying to sit: Max assist;+2 for physical assistance;HOB elevated       General bed mobility comments: HOB flat. Cues for log rolling technique, hand placement and initiation with use of bed rail. Max A +2 at BLE and trunk.  Transfers Overall transfer level: Needs assistance Equipment used: None Transfers: Lateral/Scoot Transfers          Lateral/Scoot Transfers: Max assist;+2 physical assistance;From elevated surface (+3 helpful for safety/equptment) General transfer comment: Lateral scoot to R with +3 assist and multimodal cues for anterior shift, foot/hand placement, and use of transfer board with Max A for placement. Multiple attempts to complete transfer 2/2 generalized weakness. Max A +2 for posterior scoot in recliner.    Balance Overall balance assessment: Needs assistance Sitting-balance support: Bilateral upper extremity supported;Feet supported Sitting balance-Leahy Scale: Poor Sitting balance - Comments: Min guard at best and Max A at most to maintain static sitting balance at EOB. Standing mirror in place for visual feedback. Patient able to bear weight on bilateral elbows and lean forward in prep for LB ADLs with adherence to back precautions. Postural control: Right lateral lean (Occasional L  lateral lean)     Standing balance comment: Unable                            ADL either performed or assessed with clinical judgement   ADL                                       Functional mobility during ADLs: Maximal assistance;+2 for physical assistance (Lateral scoot transfer to R.)       Vision       Perception     Praxis      Cognition Arousal/Alertness: Awake/alert Behavior During Therapy: WFL for tasks assessed/performed Overall Cognitive Status: Within Functional Limits for tasks assessed                                          Exercises     Shoulder Instructions       General Comments Mepilex boarder to sacral wound.    Pertinent Vitals/ Pain       Pain Assessment: 0-10 Pain Score: 7  Pain Location: back; location of ulcer Pain Descriptors / Indicators: Sharp Pain Intervention(s): Limited activity within patient's tolerance;Monitored during session;Repositioned;Relaxation  Home Living                                          Prior Functioning/Environment              Frequency  Min 2X/week        Progress Toward Goals  OT Goals(current goals can now be found in the care plan section)  Progress towards OT goals: Progressing toward goals  Acute Rehab OT Goals Patient Stated Goal: to figure out what is wrong OT Goal Formulation: With patient/family Time For Goal Achievement: 03/29/20 Potential to Achieve Goals: Good ADL Goals Pt Will Perform Eating: with min assist;with adaptive utensils;sitting Pt Will Perform Grooming: with min assist;sitting;with adaptive equipment Pt Will Perform Upper Body Bathing: with min assist;sitting Pt Will Transfer to Toilet: with max assist;squat pivot transfer;bedside commode Pt/caregiver will Perform Home Exercise Program: Increased ROM;Increased strength;Right Upper extremity;Left upper extremity;With minimal assist;With written HEP provided  Plan Discharge plan remains appropriate     Co-evaluation    PT/OT/SLP Co-Evaluation/Treatment: Yes Reason for Co-Treatment: Complexity of the patient's impairments (multi-system involvement);For patient/therapist safety;To address functional/ADL transfers   OT goals addressed during session: Proper use of Adaptive equipment and DME;Strengthening/ROM      AM-PAC OT "6 Clicks" Daily Activity     Outcome Measure   Help from another person eating meals?: A Lot Help from another person taking care of personal grooming?: A Lot Help from another person toileting, which includes using toliet, bedpan, or urinal?: Total Help from another person bathing (including washing, rinsing, drying)?: A Lot Help from another person to put on and taking off regular upper body clothing?: Total Help from another person to put on and taking off regular lower body clothing?: Total 6 Click Score: 9    End of Session Equipment Utilized During Treatment: Gait belt  OT Visit Diagnosis: Unsteadiness on feet (R26.81);Muscle weakness (generalized) (M62.81);Pain   Activity Tolerance Patient tolerated treatment well   Patient  Left in chair;with call bell/phone within reach;with chair alarm set;with family/visitor present   Nurse Communication          Time: 1131-1147 OT Time Calculation (min): 16 min  Charges: OT General Charges $OT Visit: 1 Visit OT Treatments $Therapeutic Activity: 8-22 mins  Ouita Nish H. OTR/L Supplemental OT, Department of rehab services [phone]  Tyvion Edmondson R H. 03/17/2020, 12:49 PM

## 2020-03-17 NOTE — Progress Notes (Signed)
Subjective: The patient is alert and pleasant.  He is in no apparent distress.  Objective: Vital signs in last 24 hours: Temp:  [97.7 F (36.5 C)-98.6 F (37 C)] 97.8 F (36.6 C) (12/14 0340) Pulse Rate:  [60-73] 64 (12/14 0340) Resp:  [16-18] 17 (12/14 0340) BP: (114-157)/(53-77) 130/64 (12/14 0340) SpO2:  [97 %-100 %] 97 % (12/14 0340) Estimated body mass index is 33.15 kg/m as calculated from the following:   Height as of this encounter: 5\' 8"  (1.727 m).   Weight as of this encounter: 98.9 kg.   Intake/Output from previous day: 12/13 0701 - 12/14 0700 In: 400 [P.O.:400] Out: 1320 [Urine:1320] Intake/Output this shift: No intake/output data recorded.  Physical exam the patient is alert, pleasant and oriented.  He is moving all 4 extremities.  Lab Results: Recent Labs    03/15/20 0303 03/16/20 0410  WBC 8.3 6.5  HGB 11.7* 10.7*  HCT 36.8* 32.1*  PLT 344 329   BMET Recent Labs    03/15/20 0303 03/16/20 0410  NA 140 141  K 3.5 3.4*  CL 104 107  CO2 22 23  GLUCOSE 137* 129*  BUN 35* 29*  CREATININE 1.48* 1.38*  CALCIUM 9.3 9.2    Studies/Results: MR CERVICAL SPINE WO CONTRAST  Result Date: 03/16/2020 CLINICAL DATA:  Sensory loss in hands and feet with weakness EXAM: MRI CERVICAL SPINE WITHOUT CONTRAST TECHNIQUE: Multiplanar, multisequence MR imaging of the cervical spine was performed. No intravenous contrast was administered. COMPARISON:  None. FINDINGS: Motion artifact is present. Alignment: Trace retrolisthesis at C4-C5 and C5-C6. Vertebrae: Vertebral body heights are maintained. There is no substantial marrow edema. No suspicious osseous lesion. Cord: No abnormal signal. Posterior Fossa, vertebral arteries, paraspinal tissues: Unremarkable. Disc levels: C2-C3: Right uncovertebral hypertrophy and right greater than left facet hypertrophy. No canal stenosis. Marked right foraminal stenosis. Minor left foraminal stenosis. C3-C4: Disc bulge with endplate  osteophytes and left greater than right uncovertebral and facet hypertrophy. No canal stenosis. Moderate to marked right and marked left foraminal stenosis. C4-C5: Disc bulge with endplate osteophytes and facet and uncovertebral hypertrophy. Mild to moderate canal stenosis. Marked, left greater than right foraminal stenosis. C5-C6: Disc bulge with endplate osteophytes and uncovertebral and facet hypertrophy. Moderate canal stenosis. Marked, left greater than right foraminal stenosis. C6-C7: Disc bulge with endplate osteophytes and uncovertebral hypertrophy. No canal stenosis. Marked foraminal stenosis. C7-T1: Endplate osteophytes and facet hypertrophy. No canal or foraminal stenosis. IMPRESSION: No abnormal cord signal. Multilevel degenerative changes as detailed above with suboptimal stenosis evaluation due to motion artifact. Canal stenosis is greatest at C5-C6. Multilevel significant foraminal narrowing. Electronically Signed   By: Macy Mis M.D.   On: 03/16/2020 14:19    Assessment/Plan: Generalized weakness: I have reviewed the patient's cervical MRI.  It demonstrates mild spondylosis and stenosis.  There is nothing to explain generalized weakness on his cervical MRI.  I think this is more combination of medical issues, debilitation, etc.  Perhaps he will need more rehab.  LOS: 3 days     Ophelia Charter 03/17/2020, 7:42 AM

## 2020-03-18 LAB — COMPREHENSIVE METABOLIC PANEL
ALT: 153 U/L — ABNORMAL HIGH (ref 0–44)
AST: 68 U/L — ABNORMAL HIGH (ref 15–41)
Albumin: 3 g/dL — ABNORMAL LOW (ref 3.5–5.0)
Alkaline Phosphatase: 154 U/L — ABNORMAL HIGH (ref 38–126)
Anion gap: 11 (ref 5–15)
BUN: 36 mg/dL — ABNORMAL HIGH (ref 8–23)
CO2: 24 mmol/L (ref 22–32)
Calcium: 9.5 mg/dL (ref 8.9–10.3)
Chloride: 107 mmol/L (ref 98–111)
Creatinine, Ser: 1.44 mg/dL — ABNORMAL HIGH (ref 0.61–1.24)
GFR, Estimated: 53 mL/min — ABNORMAL LOW (ref >60)
Glucose, Bld: 148 mg/dL — ABNORMAL HIGH (ref 70–99)
Potassium: 4.3 mmol/L (ref 3.5–5.1)
Sodium: 142 mmol/L (ref 135–145)
Total Bilirubin: 0.6 mg/dL (ref 0.3–1.2)
Total Protein: 6.5 g/dL (ref 6.5–8.1)

## 2020-03-18 LAB — CBC
HCT: 36.1 % — ABNORMAL LOW (ref 39.0–52.0)
Hemoglobin: 11.9 g/dL — ABNORMAL LOW (ref 13.0–17.0)
MCH: 29.2 pg (ref 26.0–34.0)
MCHC: 33 g/dL (ref 30.0–36.0)
MCV: 88.7 fL (ref 80.0–100.0)
Platelets: 328 10*3/uL (ref 150–400)
RBC: 4.07 MIL/uL — ABNORMAL LOW (ref 4.22–5.81)
RDW: 14.6 % (ref 11.5–15.5)
WBC: 6.5 10*3/uL (ref 4.0–10.5)
nRBC: 0 % (ref 0.0–0.2)

## 2020-03-18 LAB — MISC LABCORP TEST (SEND OUT)

## 2020-03-18 LAB — SJOGRENS SYNDROME-B EXTRACTABLE NUCLEAR ANTIBODY: SSB (La) (ENA) Antibody, IgG: <0.2 AI (ref 0.0–0.9)

## 2020-03-18 LAB — GLUCOSE, CAPILLARY
Glucose-Capillary: 142 mg/dL — ABNORMAL HIGH (ref 70–99)
Glucose-Capillary: 180 mg/dL — ABNORMAL HIGH (ref 70–99)
Glucose-Capillary: 145 mg/dL — ABNORMAL HIGH (ref 70–99)
Glucose-Capillary: 161 mg/dL — ABNORMAL HIGH (ref 70–99)

## 2020-03-18 LAB — RHEUMATOID FACTOR: Rheumatoid fact SerPl-aCnc: <10 IU/mL (ref <14.0)

## 2020-03-18 LAB — ANA W/REFLEX IF POSITIVE: Anti Nuclear Antibody (ANA): NEGATIVE

## 2020-03-18 LAB — SJOGRENS SYNDROME-A EXTRACTABLE NUCLEAR ANTIBODY: SSA (Ro) (ENA) Antibody, IgG: 0.7 AI (ref 0.0–0.9)

## 2020-03-18 MED ORDER — CLOPIDOGREL BISULFATE 75 MG PO TABS
75.0000 mg | ORAL_TABLET | Freq: Every day | ORAL | Status: DC
  Administered 2020-03-18: 12:00:00 75 mg via ORAL
  Filled 2020-03-18: qty 1

## 2020-03-18 NOTE — Progress Notes (Signed)
Physical Therapy Treatment Patient Details Name: James [name]. MRN: [id number] DOB: 28-Nov-1950 Today's Date: 03/18/2020    History of Present Illness Pt is a 69 year old male who presented with subacute progressive generalized weakness following his recent surgery in November 2021 in which he received a laminectomy and spinal fusion of L3-5 for lumbar spinal stenosis. He reported feeling like he was doing well initial 2 weeks in inpatient rehab following the surgery but then started to decline prior to discharge. He reported R leg weakness > L, similar to presentation prior to surgery. Since surgery pt has also had a UTI and a new sacral decubitus ulcer. CT of head negative for acute intracranial abnormalities. CT of abdomen negative for acute intra-abdominal or intrapelvic pathology and showing intact L3-5 surgical hardware and no acute compression fracture. There is persistent at least moderate neural foraminal stenosis at the L5 level on the R, per CT report. Neurology consulted and pt being worked up for GBS.  PMH: ventricular trachyarrhythmia, typical atrial flutter, s/p ICD, renal oncocytoma of R kidney, obese, SNTEMI, metabolic bone disease, HTN, dyspnea, DM, COPD GOLD II, CKD stage 3, CHF, CAD, and localized prostate CA.    PT Comments    Pt continues to be limited by fatigue and displays significant global weakness, especially on his R side compared to his L in the legs. Pt able to move all limbs partially through available ROM actively or required AAROM to do so. Extra time and maxA to roll in bed along with maxAx2 to transition sidelying <> sit EOB with multi-modal single step cues provided and extra time to respond. Pt declined out of bed to chair this date due to reporting being up in chair from 8 am 03/17/20 to 3 am 03/18/20. Thus, focused session on lower extremity strengthening and improving his midline sitting balance with assistance from rehab tech. Pt able to sit EOB with UE  support for moments of only min guard assist in midline before he would fatigue and lean posteriorly or laterally with maxA. Will continue to follow acutely. Current recommendations remain appropriate.   Follow Up Recommendations  CIR;Supervision/Assistance - 24 hour (if pt denies CIR then SNF unless family can provide extensive level of assistance necessary)     Equipment Recommendations  Hospital bed;Wheelchair (measurements PT);Wheelchair cushion (measurements PT);Other (comment) (mechanical lift)    Recommendations for Other Services Rehab consult     Precautions / Restrictions Precautions Precautions: Back;Fall;Other (comment) (sacral wound, RN aware) Precaution Booklet Issued: No Precaution Comments: Reviewed spinal precautions. Required Braces or Orthoses: Spinal Brace Spinal Brace: Lumbar corset;Applied in sitting position Restrictions Weight Bearing Restrictions: No    Mobility  Bed Mobility Overal bed mobility: Needs Assistance Bed Mobility: Rolling;Sidelying to Sit;Sit to Sidelying Rolling: Max assist Sidelying to sit: Max assist;+2 for physical assistance;HOB elevated     Sit to sidelying: Max assist;+2 for physical assistance;+2 for safety/equipment General bed mobility comments: Cues for foot placement on bed and ipsilateral hand to reach for bed rail to assist with roll, maxA to complete with min activation/movement through limbs. MaxAX2 to manage trunk and legs during transitions sidelying <> sit, cuing to utilize L elbow to control trunk with min activation noted from pt. AAROm to slide legs to EOB.  Transfers                 General transfer comment: Pt declined to get into chair and stated "I am not touching that chair".  Ambulation/Gait  General Gait Details: Unable   Stairs             Wheelchair Mobility    Modified Rankin (Stroke Patients Only) Modified Rankin (Stroke Patients Only) Pre-Morbid Rankin Score: Moderately  severe disability Modified Rankin: Severe disability     Balance Overall balance assessment: Needs assistance Sitting-balance support: Bilateral upper extremity supported;Feet supported Sitting balance-Leahy Scale: Poor Sitting balance - Comments: Min guard at best and Max A at most to maintain static sitting balance at EOB. Cued bilat palm placement on bed, with tendency to slide hands off bed or return to supination and thus repeated correction. Visual, tactile, and verbal cues provided for pt to line his body up with therapist anterior to him and attempt to maintain midline. Able to find and maintain midline for bouts of ~5-20 sec before fatigues and leans laterally or posteriorly. Postural control: Right lateral lean;Left lateral lean;Posterior lean (mostly L but occasionally R)     Standing balance comment: Unable                            Cognition Arousal/Alertness: Awake/alert Behavior During Therapy: WFL for tasks assessed/performed Overall Cognitive Status: Impaired/Different from baseline Area of Impairment: Attention;Awareness;Problem solving;Following commands                   Current Attention Level: Sustained   Following Commands: Follows one step commands inconsistently;Follows one step commands with increased time   Awareness: Emergent Problem Solving: Slow processing;Decreased initiation;Requires verbal cues;Requires tactile cues;Difficulty sequencing General Comments: pt with decreased awareness of deficits as well as slow processing. Required multi-modal single step cues to sequence steps and acknowledge deficits to correct them.      Exercises General Exercises - Lower Extremity Ankle Circles/Pumps: AROM;Both;10 reps;Supine Quad Sets: Strengthening;Both;10 reps;Supine Short Arc Quad:  (attempted but unable to lift at all) Hip Flexion/Marching: AAROM;Strengthening;Both;10 reps;Seated    General Comments        Pertinent Vitals/Pain  Pain Assessment: Faces Faces Pain Scale: Hurts even more Pain Location: back and shoulders Pain Descriptors / Indicators: Discomfort;Grimacing Pain Intervention(s): Limited activity within patient's tolerance;Monitored during session;Repositioned    Home Living                      Prior Function            PT Goals (current goals can now be found in the care plan section) Acute Rehab PT Goals Patient Stated Goal: to figure out what is wrong PT Goal Formulation: With patient/family Time For Goal Achievement: 03/29/20 Potential to Achieve Goals: Fair Progress towards PT goals: Progressing toward goals    Frequency    Min 4X/week      PT Plan Current plan remains appropriate    Co-evaluation              AM-PAC PT "6 Clicks" Mobility   Outcome Measure  Help needed turning from your back to your side while in a flat bed without using bedrails?: A Lot Help needed moving from lying on your back to sitting on the side of a flat bed without using bedrails?: A Lot Help needed moving to and from a bed to a chair (including a wheelchair)?: A Lot Help needed standing up from a chair using your arms (e.g., wheelchair or bedside chair)?: Total Help needed to walk in hospital room?: Total Help needed climbing 3-5 steps with a railing? : Total 6 Click  Score: 9    End of Session Equipment Utilized During Treatment: Gait belt Activity Tolerance: Patient limited by fatigue Patient left: with call bell/phone within reach;in bed;with bed alarm set Nurse Communication: Mobility status;Need for lift equipment;Other (comment) (need for soft-touch alarm) PT Visit Diagnosis: Unsteadiness on feet (R26.81);Muscle weakness (generalized) (M62.81);Difficulty in walking, not elsewhere classified (R26.2);Other symptoms and signs involving the nervous system (R29.898);Pain Pain - part of body:  (back and shoulders)     Time: 8550-1586 PT Time Calculation (min) (ACUTE ONLY): 27  min  Charges:  $Therapeutic Exercise: 8-22 mins $Neuromuscular Re-education: 8-22 mins                     Moishe Spice, PT, DPT Acute Rehabilitation Services  Pager: [phone] Office: [phone]    Orvan Falconer 03/18/2020, 12:11 PM

## 2020-03-18 NOTE — Progress Notes (Signed)
I spoke with the patient's wife.  I updated her on his condition.  She is concerned that he has GBS and would like him to have a spinal tap for further evaluation.  I explained that we had the LP schedule but he refused.  She tells me he would be agreeable at this point.  We will revisit this tomorrow.  I have answered all questions.

## 2020-03-18 NOTE — Progress Notes (Signed)
Subjective: The patient is alert and pleasant.  He is in no apparent distress.  He has no complaints.  Objective: Vital signs in last 24 hours: Temp:  [97.5 F (36.4 C)-98.5 F (36.9 C)] 97.5 F (36.4 C) (12/15 0838) Pulse Rate:  [59-72] 72 (12/15 0838) Resp:  [14-20] 20 (12/15 0838) BP: (119-153)/(57-82) 153/73 (12/15 0838) SpO2:  [97 %-100 %] 97 % (12/15 0838) Estimated body mass index is 33.15 kg/m as calculated from the following:   Height as of this encounter: 5\' 8"  (1.727 m).   Weight as of this encounter: 98.9 kg.   Intake/Output from previous day: 12/14 0701 - 12/15 0700 In: -  Out: 700 [Urine:700] Intake/Output this shift: No intake/output data recorded.  Physical exam the patient is alert and oriented.  He is moving all 4 extremities.  He is diffusely weak.  Lab Results: Recent Labs    03/17/20 0735 03/18/20 0323  WBC 6.4 6.5  HGB 11.0* 11.9*  HCT 33.0* 36.1*  PLT 333 328   BMET Recent Labs    03/17/20 0735 03/18/20 0323  NA 140 142  K 4.0 4.3  CL 107 107  CO2 23 24  GLUCOSE 138* 148*  BUN 30* 36*  CREATININE 1.39* 1.44*  CALCIUM 9.3 9.5    Studies/Results: MR CERVICAL SPINE WO CONTRAST  Result Date: 03/16/2020 CLINICAL DATA:  Sensory loss in hands and feet with weakness EXAM: MRI CERVICAL SPINE WITHOUT CONTRAST TECHNIQUE: Multiplanar, multisequence MR imaging of the cervical spine was performed. No intravenous contrast was administered. COMPARISON:  None. FINDINGS: Motion artifact is present. Alignment: Trace retrolisthesis at C4-C5 and C5-C6. Vertebrae: Vertebral body heights are maintained. There is no substantial marrow edema. No suspicious osseous lesion. Cord: No abnormal signal. Posterior Fossa, vertebral arteries, paraspinal tissues: Unremarkable. Disc levels: C2-C3: Right uncovertebral hypertrophy and right greater than left facet hypertrophy. No canal stenosis. Marked right foraminal stenosis. Minor left foraminal stenosis. C3-C4: Disc  bulge with endplate osteophytes and left greater than right uncovertebral and facet hypertrophy. No canal stenosis. Moderate to marked right and marked left foraminal stenosis. C4-C5: Disc bulge with endplate osteophytes and facet and uncovertebral hypertrophy. Mild to moderate canal stenosis. Marked, left greater than right foraminal stenosis. C5-C6: Disc bulge with endplate osteophytes and uncovertebral and facet hypertrophy. Moderate canal stenosis. Marked, left greater than right foraminal stenosis. C6-C7: Disc bulge with endplate osteophytes and uncovertebral hypertrophy. No canal stenosis. Marked foraminal stenosis. C7-T1: Endplate osteophytes and facet hypertrophy. No canal or foraminal stenosis. IMPRESSION: No abnormal cord signal. Multilevel degenerative changes as detailed above with suboptimal stenosis evaluation due to motion artifact. Canal stenosis is greatest at C5-C6. Multilevel significant foraminal narrowing. Electronically Signed   By: Macy Mis M.D.   On: 03/16/2020 14:19    Assessment/Plan: Diffuse weakness: It looks like the patient will need rehab or skilled nursing facility placement for further recuperation.  LOS: 4 days     Ophelia Charter 03/18/2020, 9:10 AM

## 2020-03-18 NOTE — Progress Notes (Signed)
Left a message with Becky, (Dr. Arnoldo Morale secretary) Regarding the family wanting to meet with him as soon as possible.

## 2020-03-18 NOTE — Progress Notes (Signed)
   Providing Compassionate, Quality Care - Together   Spoke with the patient's daughter this morning. She is very upset that no further attempts for a lumbar puncture are being attempted. She understands that her father refused this procedure twice. She is frustrated that she and her mother were not given a chance to convince her father to move forward with the LP. She expressed a desire to move her father to a teaching hospital, where she feels he will "have more eyes on him." It was explained that his strength on his neurological exam has improved, but she doesn't feel enough has been done to diagnose the cause of his weakness. I reviewed the cervical and lumbar MRIs with her and explained there were no acute changes that would explain his weakness. It was expressed that it is within her right to pursue a second opinion or transfer to another facility.     Viona Gilmore, DNP, AGNP-C Nurse Practitioner 03/18/2020 9:23 AM  [facility] [place] [address], [address], [place], [place] [address] P: [phone]    F: [phone]

## 2020-03-18 NOTE — Progress Notes (Addendum)
[facility] PROGRESS NOTE   James [name]. EXH:[id number] DOB: 1950-09-29 DOA: 03/16/2020  PCP: [name], MD  Brief History/Interval Summary:  69 y.o. male with hx significant for CAD s/p CABG, hx of AFL and VT s/p ICD, CKD stage 3, DMT2 on insulin, localized prostate CA, hx of R renal oncocytoma s/p R nephrectomy who had laminectomy and spinal fusion of L3-L5 in November for lumbar spinal stenosis and has been having subacute progressive generalized weakness since surgery.  Patient was admitted to rehab on 02/13/20 and discharged on 03/04/20 post back surgery.  Per patient and wife, patient was doing well in inpatient rehab about 2 weeks into the program and actually felt that he was declining prior to discharge from rehab. Wife reported that patient was mostly sleeping all day while in rehab. He was also diagnosed with UTI (urine culture 11/27 grew Serratia) and treated with a  bactrim course. Patient relies on self straight catheterization due to urinary retention about 3 times a day (wife, caregiver, assists with caths).   Since arriving home, patient has basically been unable to get out of bed. Patient feels that his RLE is weaker than the left, which is similar to prior to the surgery -- but overall his stamina has significantly decreased over the past month. Wife also noted labile BP as low as 70/50s but then increasing back to 381-017P systolics.   Prior to surgery earlier this year, patient states that he was driving and able to perform all ADLs.     Reason for Visit: Physical deconditioning.  Urinary retention  Consultants:  Urology Neurology Neurosurgery  Procedures: Placement of 20 French coud Foley catheter  Antibiotics: Anti-infectives (From admission, onward)   None      Subjective/Interval History: Patient mentions that he is about the same as yesterday.  Denies any new complaints.  Continues to have weakness in his lower extremities.        Assessment/Plan:  Weakness upper and lower extremity/concern for GBS/AIDP Patient noted to have weakness in his upper extremities as well.  The lower extremity weakness was attributed to his lumbar disease.  He was seen by neurosurgery.  He was subsequently seen by physical and Occupational Therapy.  There was no clear reason for his upper extremity weakness.  Neurology was subsequently consulted.   Initially there was concern for GBS/AIDP.  MRI cervical spine was done after his ICD was turned off.  Does not show any acute changes.  Chronic degenerative changes were noted.  No findings to explain his symptoms.   Patient being evaluated by neurology.  They feel that patient strength has slightly improved.  They were able to elicit reflexes.  Patient did not want to undergo attempts at LP again so this was canceled.  At this time neurology does not think that this is AIDP.  Likely chronic progressive neuropathy of unclear etiology.  They have ordered additional work-up.  Patient will need EMG nerve conduction velocity studies in the outpatient setting.  PT and OT is following.  Patient needs rehabilitation.    Recent L3-L5 lumbar laminectomy and spinal fusion in November 2021 Patient has right lower extremity weakness which is from prior to surgery.  Imaging studies did not show any acute findings.  Case was discussed with neurosurgery when patient was in the ER.  Patient was seen by [name] initially and then followed by Dr. Arnoldo Morale.  No new recommendations at this time.  Pain medications.  Lidoderm patch.  Neurosurgery  continues to follow.  Chronic kidney disease stage IIIb with history of right nephrectomy Baseline creatinine around 1.5.  Presented with creatinine of 1.8.  Patient given gentle IV hydration.   Urinary retention also likely contributed.  Renal function is back to baseline.Monitor urine output.   Acute urinary retention Patient with bladder outlet obstruction requiring  self-catheterization at home.  On 12/11 night he was noted to have urinary retention.  Despite multiple attempts by nursing staff a Foley catheter could not be placed.  A coud also could not be placed either.  Urology was consulted and they were able to place a 20 [place] coud catheter.  Tamsulosin to be continued.  Leave Foley in for now.  Patient needs to follow-up with his urology at [facility], [name].    Transaminitis Reason for this is not entirely clear.  Noted previously as well.  Hepatitis panel unremarkable.  Hepatic steatosis noted on previous imaging studies.  Patient is noted to be on amiodarone.  Dose was decreased.  Statin on hold currently.  LFTs have been stable for the most part.  History of recent UTI UA was noted to be clear at this admission.  Recently completed course of Bactrim.  Hold off on antibiotics currently.  History of coronary artery disease status post CABG Stable.  No anginal symptoms.  Continue home medications.  Noted to be on Plavix and metoprolol.  Plavix was initially held as he was thought to need an LP.  Now that no lumbar puncture is planned Plavix can be resumed.  Discussed with neurology today.   History of ischemic cardiomyopathy/chronic systolic CHF EF known to be about 40%.  Was actually noted to be hypovolemic at admission.  His diuretics and Entresto were placed on hold due to renal failure.   Volume status is stable.  Renal function has been stable.  Delene Loll was resumed yesterday.  Creatinine noted to be slightly higher today.  We will recheck labs tomorrow.  Continue to hold diuretics    History of ventricular tachycardia and atrial flutter On amiodarone.  He is status post ICD placement.  History of diabetes mellitus type 2 on insulin Lantus being continued.  Monitor CBGs.  HbA1c 5.9 in November.  CBGs are reasonably well controlled.  Hypothyroidism TSH was normal at 2.69.  Free T4 noted to be mildly elevated at 1.6.  No  clinical signs of hyperthyroidism noted.  Recommend rechecking thyroid function tests in the outpatient setting.  Will not change his Synthroid dose at this time.  History of anxiety and insomnia Continue home medications.  History of COPD Respiratory status is stable.  Normocytic anemia Hemoglobin is stable.  No evidence for overt bleeding.  Constipation Significant stool burden noted on CT scan.  Stool output has been documented.  Continue laxatives.  Obesity Estimated body mass index is 33.15 kg/m as calculated from the following:   Height as of this encounter: 5\' 8"  (1.727 m).   Weight as of this encounter: 98.9 kg.   Unfortunately I had an unpleasant conversation with patient's wife.  She was very angry.  She mentioned that she did not agree with the diagnosis provided by neurologist.  She demanded that patient be sent elsewhere.  I had previously discussed with [name] with neurology.  There is no clear indication for hospital to hospital transfer there since this is not an emergency.  Most of his issues are chronic.  He will benefit from EMG/NCV study however this is done in  the outpatient setting.  Patient's wife asking for a second opinion.  She was informed that if she can provide [place] with an accepting physician at any other facility we will be happy to call them and facilitate the transfer.  She then demanded to speak with Dr. Arnoldo Morale with neurosurgery.  This request was conveyed to the nursing staff.  DVT Prophylaxis: Lovenox Code Status: Full code Family Communication: Discussed with patient and his daughter yesterday.  She requested to speak with neurology and neurosurgery.  Both of them were notified. Disposition Plan: Inpatient rehabilitation recommended by PT and OT.  Patient does not want to return to the [facility] inpatient rehab.  TOC is consulted.  Status is: Inpatient  Remains inpatient appropriate because:Unsafe d/c plan and Inpatient level of care appropriate  due to severity of illness   Dispo: The patient is from: Home              Anticipated d/c is to: To be determined              Anticipated d/c date is: 2 days              Patient currently is not medically stable to d/c.      Medications:  Scheduled: . amiodarone  200 mg Oral Daily  . vitamin C  1,000 mg Oral BID  . Chlorhexidine Gluconate Cloth  6 each Topical Daily  . clopidogrel  75 mg Oral Daily  . docusate sodium  100 mg Oral BID  . feeding supplement  237 mL Oral TID BM  . insulin aspart  0-9 Units Subcutaneous TID WC  . insulin glargine  5 Units Subcutaneous QHS  . levothyroxine  88 mcg Oral QAC breakfast  . lidocaine  1 patch Transdermal Q24H  . lidocaine (PF)  5 mL Intradermal Once  . lidocaine  1 application Urethral Once  . melatonin  6 mg Oral QHS  . metoprolol succinate  12.5 mg Oral Daily  . multivitamin with minerals  1 tablet Oral Daily  . polyethylene glycol  17 g Oral Daily  . sacubitril-valsartan  1 tablet Oral BID  . tamsulosin  0.4 mg Oral QHS  . Vitamin D (Ergocalciferol)  50,000 Units Oral Q Mon   Continuous:  XIP:[id number], albuterol, bisacodyl, cyclobenzaprine, LORazepam, morphine injection, oxyCODONE   Objective:  Vital Signs  Vitals:   03/17/20 2120 03/17/20 2350 03/18/20 0437 03/18/20 0838  BP:  (!) 147/82 (!) 147/75 (!) 153/73  Pulse:  60 64 72  Resp: 14 18 14 20   Temp:  98.1 F (36.7 C) 97.7 F (36.5 C) (!) 97.5 F (36.4 C)  TempSrc:  Oral Oral Oral  SpO2:  99% 100% 97%  Weight:      Height:        Intake/Output Summary (Last 24 hours) at 03/18/2020 0952 Last data filed at 03/18/2020 0914 Gross per 24 hour  Intake 0 ml  Output 700 ml  Net -700 ml   Filed Weights   03/15/2020 1823  Weight: 98.9 kg    General appearance: Awake alert.  In no distress Resp: Clear to auscultation bilaterally.  Normal effort Cardio: S1-S2 is normal regular.  No S3-S4.  No rubs murmurs or bruit GI: Abdomen is soft.  Nontender  nondistended.  Bowel sounds are present normal.  No masses organomegaly Extremities: No edema.       Lab Results:  Data Reviewed: I have personally reviewed following labs and imaging studies  CBC: Recent Labs  Lab 03/15/2020 1320 03/15/20 0303 03/16/20 0410 03/17/20 0735 03/18/20 0323  WBC 9.8 8.3 6.5 6.4 6.5  HGB 11.4* 11.7* 10.7* 11.0* 11.9*  HCT 36.2* 36.8* 32.1* 33.0* 36.1*  MCV 89.4 87.8 87.2 87.3 88.7  PLT 336 344 329 333 681    Basic Metabolic Panel: Recent Labs  Lab 03/17/2020 1320 03/15/20 0303 03/16/20 0410 03/17/20 0735 03/18/20 0323  NA 137 140 141 140 142  K 3.8 3.5 3.4* 4.0 4.3  CL 103 104 107 107 107  CO2 23 22 23 23 24   GLUCOSE 134* 137* 129* 138* 148*  BUN 40* 35* 29* 30* 36*  CREATININE 1.80* 1.48* 1.38* 1.39* 1.44*  CALCIUM 9.2 9.3 9.2 9.3 9.5    GFR: Estimated Creatinine Clearance: 56 mL/min (A) (by C-G formula based on SCr of 1.44 mg/dL (H)).  Liver Function Tests: Recent Labs  Lab 03/16/2020 2114 03/15/20 0303 03/16/20 0410 03/17/20 0735 03/18/20 0323  AST 102* 111* 77* 72* 68*  ALT 230* 239* 174* 161* 153*  ALKPHOS 179* 172* 145* 146* 154*  BILITOT 0.7 0.8 0.8 0.6 0.6  PROT 7.0 6.8 5.9* 6.3* 6.5  ALBUMIN 3.1* 3.2* 2.7* 2.9* 3.0*     Recent Labs  Lab 04/03/2020 2114  AMMONIA 11    Cardiac Enzymes: Recent Labs  Lab 03/15/20 0303  CKTOTAL 49    BNP (last 3 results) Recent Labs    01/13/20 1403  PROBNP 1,199*    CBG: Recent Labs  Lab 03/17/20 0659 03/17/20 1240 03/17/20 1642 03/17/20 2140 03/18/20 0806  GLUCAP 124* 182* 177* 157* 142*      Recent Results (from the past 240 hour(s))  Resp Panel by RT-PCR (Flu A&B, Covid) Nasopharyngeal Swab     Status: None   Collection Time: 03/26/2020  8:37 PM   Specimen: Nasopharyngeal Swab; Nasopharyngeal(NP) swabs in vial transport medium  Result Value Ref Range Status   SARS Coronavirus 2 by RT PCR NEGATIVE NEGATIVE Final    Comment: (NOTE) SARS-CoV-2 target nucleic  acids are NOT DETECTED.  The SARS-CoV-2 RNA is generally detectable in upper respiratory specimens during the acute phase of infection. The lowest concentration of SARS-CoV-2 viral copies this assay can detect is 138 copies/mL. A negative result does not preclude SARS-Cov-2 infection and should not be used as the sole basis for treatment or other patient management decisions. A negative result may occur with  improper specimen collection/handling, submission of specimen other than nasopharyngeal swab, presence of viral mutation(s) within the areas targeted by this assay, and inadequate number of viral copies(<138 copies/mL). A negative result must be combined with clinical observations, patient history, and epidemiological information. The expected result is Negative.  Fact Sheet for Patients:  EntrepreneurPulse.com.au  Fact Sheet for Healthcare Providers:  IncredibleEmployment.be  This test is no t yet approved or cleared by the [place] FDA and  has been authorized for detection and/or diagnosis of SARS-CoV-2 by FDA under an Emergency Use Authorization (EUA). This EUA will remain  in effect (meaning this test can be used) for the duration of the COVID-19 declaration under Section 564(b)(1) of the Act, 21 U.S.C.section 360bbb-3(b)(1), unless the authorization is terminated  or revoked sooner.       Influenza A by PCR NEGATIVE NEGATIVE Final   Influenza B by PCR NEGATIVE NEGATIVE Final    Comment: (NOTE) The Xpert Xpress SARS-CoV-2/FLU/RSV plus assay is intended as an aid in the diagnosis of influenza from Nasopharyngeal swab specimens and should not be used as a sole basis  for treatment. Nasal washings and aspirates are unacceptable for Xpert Xpress SARS-CoV-2/FLU/RSV testing.  Fact Sheet for Patients: EntrepreneurPulse.com.au  Fact Sheet for Healthcare Providers: IncredibleEmployment.be  This  test is not yet approved or cleared by the [place] FDA and has been authorized for detection and/or diagnosis of SARS-CoV-2 by FDA under an Emergency Use Authorization (EUA). This EUA will remain in effect (meaning this test can be used) for the duration of the COVID-19 declaration under Section 564(b)(1) of the Act, 21 U.S.C. section 360bbb-3(b)(1), unless the authorization is terminated or revoked.  Performed at [facility], [place] [address] [place], [place] [address]   Culture, blood (routine x 2)     Status: None (Preliminary result)   Collection Time: 03/15/20  2:48 AM   Specimen: BLOOD RIGHT WRIST  Result Value Ref Range Status   Specimen Description BLOOD RIGHT WRIST  Final   Special Requests   Final    BOTTLES DRAWN AEROBIC AND ANAEROBIC Blood Culture adequate volume   Culture   Final    NO GROWTH 2 DAYS Performed at [facility], [place] [address] [place], [place] [address]    Report Status PENDING  Incomplete  Culture, blood (routine x 2)     Status: None (Preliminary result)   Collection Time: 03/15/20  3:11 AM   Specimen: BLOOD LEFT FOREARM  Result Value Ref Range Status   Specimen Description BLOOD LEFT FOREARM  Final   Special Requests   Final    BOTTLES DRAWN AEROBIC AND ANAEROBIC Blood Culture adequate volume   Culture   Final    NO GROWTH 2 DAYS Performed at [facility], [place] [address] [place], [place] [address]    Report Status PENDING  Incomplete      Radiology Studies: MR CERVICAL SPINE WO CONTRAST  Result Date: 03/16/2020 CLINICAL DATA:  Sensory loss in hands and feet with weakness EXAM: MRI CERVICAL SPINE WITHOUT CONTRAST TECHNIQUE: Multiplanar, multisequence MR imaging of the cervical spine was performed. No intravenous contrast was administered. COMPARISON:  None. FINDINGS: Motion artifact is present. Alignment: Trace retrolisthesis at C4-C5 and C5-C6. Vertebrae: Vertebral body heights are maintained. There is no substantial  marrow edema. No suspicious osseous lesion. Cord: No abnormal signal. Posterior Fossa, vertebral arteries, paraspinal tissues: Unremarkable. Disc levels: C2-C3: Right uncovertebral hypertrophy and right greater than left facet hypertrophy. No canal stenosis. Marked right foraminal stenosis. Minor left foraminal stenosis. C3-C4: Disc bulge with endplate osteophytes and left greater than right uncovertebral and facet hypertrophy. No canal stenosis. Moderate to marked right and marked left foraminal stenosis. C4-C5: Disc bulge with endplate osteophytes and facet and uncovertebral hypertrophy. Mild to moderate canal stenosis. Marked, left greater than right foraminal stenosis. C5-C6: Disc bulge with endplate osteophytes and uncovertebral and facet hypertrophy. Moderate canal stenosis. Marked, left greater than right foraminal stenosis. C6-C7: Disc bulge with endplate osteophytes and uncovertebral hypertrophy. No canal stenosis. Marked foraminal stenosis. C7-T1: Endplate osteophytes and facet hypertrophy. No canal or foraminal stenosis. IMPRESSION: No abnormal cord signal. Multilevel degenerative changes as detailed above with suboptimal stenosis evaluation due to motion artifact. Canal stenosis is greatest at C5-C6. Multilevel significant foraminal narrowing. Electronically Signed   By: Macy Mis M.D.   On: 03/16/2020 14:19       LOS: 4 days   [facility] Pager on [email or web address]  03/18/2020, 9:52 AM

## 2020-03-19 ENCOUNTER — Telehealth: Payer: Self-pay | Admitting: Cardiology

## 2020-03-19 LAB — CBC
HCT: 38 % — ABNORMAL LOW (ref 39.0–52.0)
Hemoglobin: 12 g/dL — ABNORMAL LOW (ref 13.0–17.0)
MCH: 28.4 pg (ref 26.0–34.0)
MCHC: 31.6 g/dL (ref 30.0–36.0)
MCV: 89.8 fL (ref 80.0–100.0)
Platelets: 333 10*3/uL (ref 150–400)
RBC: 4.23 MIL/uL (ref 4.22–5.81)
RDW: 14.7 % (ref 11.5–15.5)
WBC: 7.8 10*3/uL (ref 4.0–10.5)
nRBC: 0 % (ref 0.0–0.2)

## 2020-03-19 LAB — PROTEIN ELECTROPHORESIS, SERUM
A/G Ratio: 0.9 (ref 0.7–1.7)
Albumin ELP: 3.1 g/dL (ref 2.9–4.4)
Alpha-1-Globulin: 0.3 g/dL (ref 0.0–0.4)
Alpha-2-Globulin: 1.1 g/dL — ABNORMAL HIGH (ref 0.4–1.0)
Beta Globulin: 1 g/dL (ref 0.7–1.3)
Gamma Globulin: 1.1 g/dL (ref 0.4–1.8)
Globulin, Total: 3.5 g/dL (ref 2.2–3.9)
Total Protein ELP: 6.6 g/dL (ref 6.0–8.5)

## 2020-03-19 LAB — GLUCOSE, CAPILLARY
Glucose-Capillary: 138 mg/dL — ABNORMAL HIGH (ref 70–99)
Glucose-Capillary: 156 mg/dL — ABNORMAL HIGH (ref 70–99)
Glucose-Capillary: 150 mg/dL — ABNORMAL HIGH (ref 70–99)
Glucose-Capillary: 159 mg/dL — ABNORMAL HIGH (ref 70–99)

## 2020-03-19 LAB — COMPREHENSIVE METABOLIC PANEL
ALT: 137 U/L — ABNORMAL HIGH (ref 0–44)
AST: 60 U/L — ABNORMAL HIGH (ref 15–41)
Albumin: 2.9 g/dL — ABNORMAL LOW (ref 3.5–5.0)
Alkaline Phosphatase: 145 U/L — ABNORMAL HIGH (ref 38–126)
Anion gap: 9 (ref 5–15)
BUN: 29 mg/dL — ABNORMAL HIGH (ref 8–23)
CO2: 25 mmol/L (ref 22–32)
Calcium: 9.4 mg/dL (ref 8.9–10.3)
Chloride: 108 mmol/L (ref 98–111)
Creatinine, Ser: 1.22 mg/dL (ref 0.61–1.24)
GFR, Estimated: >60 mL/min (ref >60)
Glucose, Bld: 137 mg/dL — ABNORMAL HIGH (ref 70–99)
Potassium: 4.6 mmol/L (ref 3.5–5.1)
Sodium: 142 mmol/L (ref 135–145)
Total Bilirubin: 0.9 mg/dL (ref 0.3–1.2)
Total Protein: 6.5 g/dL (ref 6.5–8.1)

## 2020-03-19 LAB — IMMUNOFIXATION ELECTROPHORESIS
IgA: 325 mg/dL (ref 61–437)
IgG (Immunoglobin G), Serum: 1016 mg/dL (ref 603–1613)
IgM (Immunoglobulin M), Srm: 99 mg/dL (ref 20–172)
Total Protein ELP: 6.4 g/dL (ref 6.0–8.5)

## 2020-03-19 LAB — MISC LABCORP TEST (SEND OUT): Labcorp test code: 140280

## 2020-03-19 LAB — MAG INTERPRETATION REFLEXED

## 2020-03-19 LAB — MAG IGM ANTIBODIES: MAG IgM Antibodies: <900 BTU (ref 0–999)

## 2020-03-19 LAB — COPPER, SERUM: Copper: 128 ug/dL (ref 69–132)

## 2020-03-19 LAB — ZINC: Zinc: 87 ug/dL (ref 44–115)

## 2020-03-19 NOTE — Progress Notes (Signed)
Subjective: The patient is alert and pleasant.  He is in no apparent distress.  He denies pain.  His daughter is at the bedside.  Objective: Vital signs in last 24 hours: Temp:  [97.5 F (36.4 C)-98.2 F (36.8 C)] 98.2 F (36.8 C) (12/16 1247) Pulse Rate:  [59-70] 60 (12/16 1247) Resp:  [13-18] 18 (12/16 1247) BP: (122-159)/(57-73) 122/57 (12/16 1247) SpO2:  [97 %-100 %] 100 % (12/16 1247) Estimated body mass index is 33.15 kg/m as calculated from the following:   Height as of this encounter: 5\' 8"  (1.727 m).   Weight as of this encounter: 98.9 kg.   Intake/Output from previous day: 12/15 0701 - 12/16 0700 In: 100 [P.O.:100] Out: 1550 [Urine:1550] Intake/Output this shift: Total I/O In: 150 [P.O.:150] Out: -   Physical exam the patient is alert and pleasant.  He moves all 4 extremities.  Lab Results: Recent Labs    03/18/20 0323 03/19/20 0131  WBC 6.5 7.8  HGB 11.9* 12.0*  HCT 36.1* 38.0*  PLT 328 333   BMET Recent Labs    03/18/20 0323 03/19/20 0131  NA 142 142  K 4.3 4.6  CL 107 108  CO2 24 25  GLUCOSE 148* 137*  BUN 36* 29*  CREATININE 1.44* 1.22  CALCIUM 9.5 9.4    Studies/Results: No results found.  Assessment/Plan: Generalized weakness: I had a long discussion with the patient's daughter.  She is interested in pursuing a lumbar puncture.  I told her I did not think this would likely not help [place] much but I am not opposed to the idea.  I subsequently spoke with [name].  She is going to get in touch with the family to discuss this further.  I told the patient and his daughter I think his weakness is likely metabolic, secondary to his multiple medical problems, recent UTI, etc.  I have answered all her questions.  LOS: 5 days     Ophelia Charter 03/19/2020, 2:23 PM

## 2020-03-19 NOTE — Progress Notes (Signed)
Neurology Progress Note   Patient ID: James [name]. is a 69 y.o. with PMHx of DM2 c/b neuropathy and CKD, CAD, CHF s/p AICD, HTN, HLD, chart-diagnosed COPD denied by patient/wife, atrial flutter (not on [facility]), renal oncocytoma (s/p right nephrectomy), localized prostate cancer (planned for surgery when medically stabilized).    Most notably he had lumbar laminectomy & fusion of L3-L5 in Nov and has been doing inpatient rehab and had just d/c from there to home. He was treated for an UTI after his rehab stay by home health. On Thr morning 12/9, the pt noted numbness bilat feet to ankles and bilat fingers up to wrists.  Hospital course has been complicated by significant pain leading to difficulty dog tolerating diagnostic procedures, fluctuating effort on strength testing, delirium  Asked by neurosurgery and primary team to rediscuss utility of lumbar puncture with family per their request  Subjective: -Continues to complain of pain -Reports sensation changes are stable and remain at the wrists and ankles down -Reports feeling fatigued after working with PT  Exam: Vitals:   03/19/20 1524 03/19/20 1958  BP: (!) 151/67 (!) 159/75  Pulse: 60 72  Resp: 18 17  Temp: 98.3 F (36.8 C) 97.7 F (36.5 C)  SpO2: 100% 98%   General - debilitated, comfortable Heart - perfusing extremities well Lungs - Comfortable on room air, no wheezing or apnea Abdomen - Soft - non tender, obese  Extremities - Distal pulses intact - mild edema in bilateral hands and feet stable slightly increased from prior  Neurologic Examination:   Mental Status:  Alert, recalls our prior conversations Cranial Nerves:  II-bilateral visual fields intact grossly III/IV/VI- Extraocular movements were full.  VIII-hearing normal.  X-normal speech Motor:  L/R Bilat UE: finger flexion 4/4 and finger extension 4-/4- Bilat LE: hip flex 2/2, ankle flex 2/4-, ankle ext 4/4+ Sensory: stocking-glove distribution loss of  temperature, symmetric; vibratory sensation at the knees is 8 seconds Deep Tendon Reflexes: 1+ right biceps, otherwise not clearly able to elicit reflexes today again Cerebellar: unable due to weakness  Gait: not tested  Pertinent Labs: Last metabolic panel Lab Results  Component Value Date   GLUCOSE 137 (H) 03/19/2020   NA 142 03/19/2020   K 4.6 03/19/2020   CL 108 03/19/2020   CO2 25 03/19/2020   BUN 29 (H) 03/19/2020   CREATININE 1.22 03/19/2020   GFRNONAA >60 03/19/2020   GFRAA 56 (L) 01/13/2020   CALCIUM 9.4 03/19/2020   PROT 6.5 03/19/2020   ALBUMIN 2.9 (L) 03/19/2020   LABGLOB 3.5 03/17/2020   AGRATIO 0.9 03/17/2020   BILITOT 0.9 03/19/2020   ALKPHOS 145 (H) 03/19/2020   AST 60 (H) 03/19/2020   ALT 137 (H) 03/19/2020   ANIONGAP 9 03/19/2020   Cr improving from 1.48 prior to 1.38 on 12/13  AST/ALT/Alk phos chornically elevated    Lab Results  Component Value Date   ESRSEDRATE 32 (H) 03/16/2020   Lab Results  Component Value Date   CRP 1.0 (H) 03/16/2020   Lab Results  Component Value Date   CKTOTAL 49 03/15/2020   Lab Results  omponent Value Date   TSH 2.699 03/15/2020   Lab Results  Component Value Date   VITAMINB12 1,421 (H) 03/15/2020   Lab Results  Component Value Date   HGBA1C 5.9 (H) 02/06/2020   Zinc 87 Copper 128 HIV negative SPEP negative for gamma globulin spike, IFE normal ANA, SSA, SSB negative, RF negative MAG negative GM1 negative  C-spine MRI personally reviewed and discussed with neuroradiology. Motion limited. Stenosis present but doesn't appear to have sufficient compression to explain his bilateral upper extremity weakness    Impression: This is a 69 year old male with a past medical history significant for CAD, congestive heart failure s/p AICD, coronary artery disease, hypertension, hyperlipidemia, type 2 diabetes c/b neuropathy, obesity, atrial flutter, presenting with sensory loss in the hands and feet accompanied  with weakness as noted above. His exam continues to be somewhat effort dependent and generally appears stable.  On review of his cervical MRI spine, I do think the neuroforaminal stenosis may be contributing to his pain.  Additionally a "double crush" injury may be contributing to his weakness which he does report is worse on the left than the right upper extremity, matching the imaging.   I continue to think a lumbar puncture would be difficult to interpret, very low index of suspicion of infection given his clinical course this week, and expect elevated protein and mild pleocytosis secondary to his recent lumbar surgery and chronic medical conditions.  I cannot order this study as I do not feel that the benefits are outweighed by the risks.  Patient's daughter was adamant that she thinks lumbar puncture is indicated, requested a different neurologist, and asked me to leave.  I apologized for having been delayed by other critical cases, and left per her request  Recommendations: - thiamine, B6 pending - [facility] paraneoplastic panel PAVAL, particularly for VGCC (possibility of LEMS given fluctuating symptoms and smoking history) pending - Neuropathy workup: UPEP pending - Ganglioside panel ([place]) pending - EMG/NCG which cannot be done on an inpatient basis here, will be critical in clarifying etiology of his weakness and should be pursued.  - Discussion of atrial flutter with outpatient providers, consider anticoagulation (discussed with daughter).   Lesleigh Noe MD-PhD [facility] [phone]   40 minutes spent in the care of this patient today

## 2020-03-19 NOTE — Progress Notes (Signed)
PROGRESS NOTE    James [name].  PQD:[id number] DOB: 25-Dec-1950 DOA: 03/22/2020 PCP: [name], MD    Brief Narrative:  69 year old gentleman with history of coronary artery disease status post CABG, history of atrial flutter and V. 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 02/13/20-03/04/2020 status post back surgery in acute inpatient rehab.  Chronic back pain and shoulder pain issues as well as right leg weakness from spinal stenosis. Reported developed lethargy and weakness while at rehab, more sleepiness.  Treated with Bactrim and subsequently with ciprofloxacin for UTI with Serratia on 11/27.  History of self catheterization due to urinary retention about 3 times a day.  Patient unable to get out of the bed.  Right lower extremity weaker than left similar to prior but worse.  Overall very deconditioned.  Also noted weakness of the upper extremity and now cannot use left arm. Remains in the hospital with persistent and progressive weakness.   Assessment & Plan:   Principal Problem:   Generalized weakness Active Problems:   CKD stage 3 due to type 1 diabetes mellitus (HCC)   S/P ICD (internal cardiac defibrillator) procedure   Systolic CHF, chronic (HCC)   S/p nephrectomy   Transaminitis   Sacral decubitus ulcer, stage II (HCC)   Malnutrition of moderate degree  Functional paraplegia:  Patient started with right lower extremity weakness and now has progressed to functional paraplegia. Seen by neurology, initial concern was GBS/AIDP.  MRI of the cervical spine did not show any acute changes but showed chronic changes. Patient declined to have lumbar puncture x2. His muscular  weakness is progressive, cause unknown.  We will continue needing investigation. MAG IgM antibodies, rheumatoid factor, Sjogren's syndrome a and B, ANA with reflex, immunofixation electrophoresis, protein electrophoresis, B6, B1 pending. HIV negative. Copper level normal. Zinc level normal. CRP and ESR within normal limits. 24-hour urine UPEP/light chains pending. B12 adequate. TSH is adequate. Followed by neurology.  Patient now desires to have spinal tap, discussed with neurosurgery.  May need anesthesia/sedation for spinal tap. He will need EMG as outpatient. Continue to work with PT OT aggressively.  Will need aggressive rehab. Continue working with respiratory therapy, incentive spirometry.  Today's complaining of difficulty swallowing, will have a speech reevaluation.  Recent L3-L5 laminectomy and spinal fusion: Chronic right extremity weakness.  Now overall worsening weakness.  Enough pain medications and mobility.  Followed by neurosurgery.  Chronic kidney disease stage IIIb with history of right nephrectomy: Renal function improved.  Normalized.  Acute urinary retention with history of bladder outlet obstruction: 12/11, catheter placed by urology.  Do not remove.  Will send to his regular urologist for voiding trial after discharge.  Continue Flomax.  Recent UTI: Discontinue all antibiotics.  Treated.  History of coronary artery disease status post CABG: Stable.  No anginal symptoms.  Plavix were on hold since surgery.  Received 1 dose of Plavix 12/15 after spinal tap was declined.  On metoprolol.  History of ischemic cardiomyopathy/chronic systolic congestive heart failure: Known EF 40%.  Volume status stable.  Entresto resumed.  Diuretics on hold.  History of V. tach and a flutter: On amiodarone.  Has ICD in place.  Hypothyroidism: On Synthroid.  Euthyroid.  TSH normal.  Constipation:  Laxatives.  Abnormal LFTs: Remains about the stable.  Etiology unclear.  Hepatitis panel  negative.  Decrease dose of amiodarone.  Continue monitoring.  Statins on hold.   DVT prophylaxis: Lovenox subcu   Code Status: Full code Family Communication: Daughter at bedside Disposition Plan: Status is: Inpatient  Remains inpatient appropriate because:Inpatient level of care appropriate due to severity of illness   Dispo:  Patient From: Home  Planned Disposition: To be determined  Expected discharge date: 03/20/2020  Medically stable for discharge: No          Consultants:   Neurology  Neurosurgery  Procedures:   None  Antimicrobials:   None   Subjective: Patient seen and examined.  Daughter was at the bedside.  Patient stated he was doing fine but unable to move.  He has been frustrated with now even not able to press the call light with his left hand. Right leg is very weak, left leg has some strength.  Patient and family think left arm is getting more weaker than usual.  Also complained of coughing with swallowing, did swallow well on bedside evaluation.  Objective: Vitals:   03/18/20 2343 03/19/20 0345 03/19/20 0827 03/19/20 1247  BP: (!) 159/73 (!) 150/64 (!) 146/67 (!) 122/57  Pulse: 60 (!) 59 70 60  Resp: 16 16 16 18   Temp: 97.7 F (36.5 C) 97.6 F (36.4 C) 97.8 F (36.6 C) 98.2 F (36.8 C)  TempSrc: Oral Oral Oral Oral  SpO2: 99% 99% 97% 100%  Weight:      Height:        Intake/Output Summary (Last 24 hours) at 03/19/2020 1341 Last data filed at 03/19/2020 0918 Gross per 24 hour  Intake 250 ml  Output 1550 ml  Net -1300 ml   Filed Weights   03/20/2020 1823  Weight: 98.9 kg    Examination:  General exam: Appears sick.  Appropriately anxious with progressive weakness and inability to do ADLs. Respiratory system: Clear to auscultation. Respiratory effort normal.  No added sounds. Cardiovascular system: S1 & S2 heard, RRR.  Trace bilateral pedal edema.  ICD left precordium. Gastrointestinal system: Abdomen is nondistended, soft  and nontender. No organomegaly or masses felt. Normal bowel sounds heard. Foley catheter with clear urine. Central nervous system: Alert and oriented. No cranial nerve deficits. Extremities:  Right upper extremity: 3/5 on most of the muscle group. Left upper extremity: 2/5 on most of the muscle groups. Right lower extremity: Hip 1/5 or less, distal 2/5. Left lower extremity: 3/5, proximal less than distal.    Data Reviewed: I have personally reviewed following labs and imaging studies  CBC: Recent Labs  Lab 03/15/20 0303 03/16/20 0410 03/17/20 0735 03/18/20 0323 03/19/20 0131  WBC 8.3 6.5 6.4 6.5 7.8  HGB 11.7* 10.7* 11.0* 11.9* 12.0*  HCT 36.8* 32.1* 33.0* 36.1* 38.0*  MCV 87.8 87.2 87.3 88.7 89.8  PLT 344 329 333 328 975   Basic Metabolic Panel: Recent Labs  Lab 03/15/20 0303 03/16/20 0410 03/17/20 0735 03/18/20 0323 03/19/20 0131  NA 140 141 140 142 142  K 3.5 3.4* 4.0 4.3 4.6  CL 104 107 107 107 108  CO2 22 23 23 24 25   GLUCOSE 137* 129* 138* 148* 137*  BUN 35* 29* 30* 36* 29*  CREATININE 1.48* 1.38* 1.39* 1.44* 1.22  CALCIUM 9.3 9.2 9.3 9.5 9.4   GFR: Estimated Creatinine Clearance: 66.1 mL/min (by C-G formula based on SCr of 1.22 mg/dL). Liver Function Tests: Recent Labs  Lab 03/15/20  0303 03/16/20 0410 03/17/20 0735 03/18/20 0323 03/19/20 0131  AST 111* 77* 72* 68* 60*  ALT 239* 174* 161* 153* 137*  ALKPHOS 172* 145* 146* 154* 145*  BILITOT 0.8 0.8 0.6 0.6 0.9  PROT 6.8 5.9* 6.3* 6.5 6.5  ALBUMIN 3.2* 2.7* 2.9* 3.0* 2.9*   No results for input(s): LIPASE, AMYLASE in the last 168 hours. Recent Labs  Lab 03/18/2020 2114  AMMONIA 11   Coagulation Profile: No results for input(s): INR, PROTIME in the last 168 hours. Cardiac Enzymes: Recent Labs  Lab 03/15/20 0303  CKTOTAL 49   BNP (last 3 results) Recent Labs    01/13/20 1403  PROBNP 1,199*   HbA1C: No results for input(s): HGBA1C in the last 72 hours. CBG: Recent Labs  Lab  03/18/20 1115 03/18/20 1534 03/18/20 2122 03/19/20 0648 03/19/20 1245  GLUCAP 180* 145* 161* 138* 156*   Lipid Profile: No results for input(s): CHOL, HDL, LDLCALC, TRIG, CHOLHDL, LDLDIRECT in the last 72 hours. Thyroid Function Tests: No results for input(s): TSH, T4TOTAL, FREET4, T3FREE, THYROIDAB in the last 72 hours. Anemia Panel: No results for input(s): VITAMINB12, FOLATE, FERRITIN, TIBC, IRON, RETICCTPCT in the last 72 hours. Sepsis Labs: No results for input(s): PROCALCITON, LATICACIDVEN in the last 168 hours.  Recent Results (from the past 240 hour(s))  Resp Panel by RT-PCR (Flu A&B, Covid) Nasopharyngeal Swab     Status: None   Collection Time: 03/20/2020  8:37 PM   Specimen: Nasopharyngeal Swab; Nasopharyngeal(NP) swabs in vial transport medium  Result Value Ref Range Status   SARS Coronavirus 2 by RT PCR NEGATIVE NEGATIVE Final    Comment: (NOTE) SARS-CoV-2 target nucleic acids are NOT DETECTED.  The SARS-CoV-2 RNA is generally detectable in upper respiratory specimens during the acute phase of infection. The lowest concentration of SARS-CoV-2 viral copies this assay can detect is 138 copies/mL. A negative result does not preclude SARS-Cov-2 infection and should not be used as the sole basis for treatment or other patient management decisions. A negative result may occur with  improper specimen collection/handling, submission of specimen other than nasopharyngeal swab, presence of viral mutation(s) within the areas targeted by this assay, and inadequate number of viral copies(<138 copies/mL). A negative result must be combined with clinical observations, patient history, and epidemiological information. The expected result is Negative.  Fact Sheet for Patients:  EntrepreneurPulse.com.au  Fact Sheet for Healthcare Providers:  IncredibleEmployment.be  This test is no t yet approved or cleared by the [place] FDA and  has  been authorized for detection and/or diagnosis of SARS-CoV-2 by FDA under an Emergency Use Authorization (EUA). This EUA will remain  in effect (meaning this test can be used) for the duration of the COVID-19 declaration under Section 564(b)(1) of the Act, 21 U.S.C.section 360bbb-3(b)(1), unless the authorization is terminated  or revoked sooner.       Influenza A by PCR NEGATIVE NEGATIVE Final   Influenza B by PCR NEGATIVE NEGATIVE Final    Comment: (NOTE) The Xpert Xpress SARS-CoV-2/FLU/RSV plus assay is intended as an aid in the diagnosis of influenza from Nasopharyngeal swab specimens and should not be used as a sole basis for treatment. Nasal washings and aspirates are unacceptable for Xpert Xpress SARS-CoV-2/FLU/RSV testing.  Fact Sheet for Patients: EntrepreneurPulse.com.au  Fact Sheet for Healthcare Providers: IncredibleEmployment.be  This test is not yet approved or cleared by the [place] FDA and has been authorized for detection and/or diagnosis of SARS-CoV-2 by FDA under an Emergency Use Authorization (EUA).  This EUA will remain in effect (meaning this test can be used) for the duration of the COVID-19 declaration under Section 564(b)(1) of the Act, 21 U.S.C. section 360bbb-3(b)(1), unless the authorization is terminated or revoked.  Performed at [facility], [place] [address], [place], [place] [address]   Culture, blood (routine x 2)     Status: None (Preliminary result)   Collection Time: 03/15/20  2:48 AM   Specimen: BLOOD RIGHT WRIST  Result Value Ref Range Status   Specimen Description BLOOD RIGHT WRIST  Final   Special Requests   Final    BOTTLES DRAWN AEROBIC AND ANAEROBIC Blood Culture adequate volume   Culture   Final    NO GROWTH 3 DAYS Performed at [facility], [place] [address], [place], [place] [address]    Report Status PENDING  Incomplete  Culture, blood (routine x 2)     Status: None  (Preliminary result)   Collection Time: 03/15/20  3:11 AM   Specimen: BLOOD LEFT FOREARM  Result Value Ref Range Status   Specimen Description BLOOD LEFT FOREARM  Final   Special Requests   Final    BOTTLES DRAWN AEROBIC AND ANAEROBIC Blood Culture adequate volume   Culture   Final    NO GROWTH 3 DAYS Performed at [facility], [place] [employer] [address], [place], [place] [address]    Report Status PENDING  Incomplete         Radiology Studies: No results found.      Scheduled Meds: . amiodarone  200 mg Oral Daily  . vitamin C  1,000 mg Oral BID  . Chlorhexidine Gluconate Cloth  6 each Topical Daily  . docusate sodium  100 mg Oral BID  . feeding supplement  237 mL Oral TID BM  . insulin aspart  0-9 Units Subcutaneous TID WC  . insulin glargine  5 Units Subcutaneous QHS  . levothyroxine  88 mcg Oral QAC breakfast  . lidocaine  1 patch Transdermal Q24H  . lidocaine (PF)  5 mL Intradermal Once  . lidocaine  1 application Urethral Once  . melatonin  6 mg Oral QHS  . metoprolol succinate  12.5 mg Oral Daily  . multivitamin with minerals  1 tablet Oral Daily  . polyethylene glycol  17 g Oral Daily  . sacubitril-valsartan  1 tablet Oral BID  . tamsulosin  0.4 mg Oral QHS  . Vitamin D (Ergocalciferol)  50,000 Units Oral Q Mon   Continuous Infusions:   LOS: 5 days    Time spent: 35 minutes     Barb Merino, MD [facility] Pager [phone]

## 2020-03-19 NOTE — Progress Notes (Signed)
Physical Therapy Treatment Patient Details Name: James [name]. MRN: [id number] DOB: 05/29/1950 Today's Date: 03/19/2020    History of Present Illness Pt is a 69 year old male who presented with subacute progressive generalized weakness following his recent surgery in November 2021 in which he received a laminectomy and spinal fusion of L3-5 for lumbar spinal stenosis. He reported feeling like he was doing well initial 2 weeks in inpatient rehab following the surgery but then started to decline prior to discharge. He reported R leg weakness > L, similar to presentation prior to surgery. Since surgery pt has also had a UTI and a new sacral decubitus ulcer. CT of head negative for acute intracranial abnormalities. CT of abdomen negative for acute intra-abdominal or intrapelvic pathology and showing intact L3-5 surgical hardware and no acute compression fracture. There is persistent at least moderate neural foraminal stenosis at the L5 level on the R, per CT report. Neurology consulted and pt being worked up for GBS.  PMH: ventricular trachyarrhythmia, typical atrial flutter, s/p ICD, renal oncocytoma of R kidney, obese, SNTEMI, metabolic bone disease, HTN, dyspnea, DM, COPD GOLD II, CKD stage 3, CHF, CAD, and localized prostate CA.    PT Comments    Pt progressing towards his goals slowly. Treated pt in conjunction with OT this date to maximize pt safety, quality of session, and tolerance to activity. Pt continues to require maxA to roll and maxAx2 to transition sidelying <> sit EOB, but displays improved AROM of his arms this date to assist with initiating movement. Pt is very motivated to participate and improve. However, he continues to decline getting out of bed to recliner, thus at end of session pt positioned comfortably with heels floating (notified RN need for prevalon boots) and bed in chair-like position. Pt demonstrated improved endurance through sitting EOB for increased period of time >20  min, this date, but continues to require mod-maxA majority of time to maintain balance. Encouraged pt to retract scapulas, extend neck, and perform anterior pelvic tilt to improve upright posture, with momentary success of only min guard but pt easily fatigues. Pt responded well to mirror anterior to him to provide visual cues to find and maintain midline sitting EOB. Will continue to follow acutely. Current recommendations remain appropriate.   Follow Up Recommendations  CIR;Supervision/Assistance - 24 hour (if pt denies CIR then SNF unless family can provide extensive level of assistance necessary)     Equipment Recommendations  Hospital bed;Wheelchair (measurements PT);Wheelchair cushion (measurements PT);Other (comment) (mechanical lift)    Recommendations for Other Services Rehab consult     Precautions / Restrictions Precautions Precautions: Back;Fall;Other (comment) (sacral wound, RN aware) Precaution Booklet Issued: No Precaution Comments: Reviewed spinal precautions. Required Braces or Orthoses: Spinal Brace Spinal Brace: Lumbar corset;Applied in sitting position Restrictions Weight Bearing Restrictions: No    Mobility  Bed Mobility Overal bed mobility: Needs Assistance Bed Mobility: Rolling;Sidelying to Sit;Sit to Sidelying Rolling: Max assist Sidelying to sit: Max assist;+2 for physical assistance;HOB elevated     Sit to sidelying: Max assist;+2 for physical assistance;+2 for safety/equipment General bed mobility comments: Cues for foot placement on bed and ipsilateral hand to reach for bed rail to assist with roll, maxA to complete with improved activation and reach with UE rolling to R. MaxAX2 to manage trunk and legs during transitions sidelying <> sit, cuing to utilize R elbow to control trunk and to manage legs.  Transfers  General transfer comment: Pt declined to get into chair and unsafe to attempt sit to stand this date.  Ambulation/Gait              General Gait Details: Unable   Stairs             Wheelchair Mobility    Modified Rankin (Stroke Patients Only) Modified Rankin (Stroke Patients Only) Pre-Morbid Rankin Score: Moderately severe disability Modified Rankin: Severe disability     Balance Overall balance assessment: Needs assistance Sitting-balance support: Bilateral upper extremity supported;Feet supported Sitting balance-Leahy Scale: Poor Sitting balance - Comments: Mirror anterior to pt throughout sitting balance EOB with feet supported on ground and UEs placed on bed. Pt able to find and maintain midline posture better with visual cues this date. Provided tactile cues and verbal cues at 1 location of the following at a time as he has difficulty activating mutliple at a time: neck extensors to encourage looking up, rhomboids to encourage scapular restraction to look up, and sacrum for anterior pelvic tilt for more upright sitting posture. Intermittent tactile and verbal cues at L lateral trunk to decrease R lateral flexion. Pt able to hold head up for periods of ~5-45 sec at a time and hold anterior pelvic tilt ~6x for ~3 sec duration each. Moments of only min guard assist, esp when bilat UEs were resting on table anterior to him enocuraging trunk flexion rather than posterior lean, but would only last ~5-20 sec before requiring mod/maxA for balance. Postural control: Right lateral lean;Posterior lean     Standing balance comment: Unable                            Cognition Arousal/Alertness: Awake/alert Behavior During Therapy: WFL for tasks assessed/performed Overall Cognitive Status: Impaired/Different from baseline Area of Impairment: Awareness;Problem solving;Following commands                       Following Commands: Follows one step commands with increased time;Follows one step commands consistently   Awareness: Emergent Problem Solving: Slow processing;Decreased  initiation;Requires verbal cues;Requires tactile cues;Difficulty sequencing General Comments: Required multi-modal single step cues throughout with extra time to process and respond. Pt fearful of falling and unaware of him being safer in position compared to another.      Exercises      General Comments        Pertinent Vitals/Pain Pain Assessment: Faces Faces Pain Scale: Hurts even more Pain Location: back and shoulders Pain Descriptors / Indicators: Discomfort;Grimacing;Shooting Pain Intervention(s): Limited activity within patient's tolerance;Monitored during session;Repositioned    Home Living                      Prior Function            PT Goals (current goals can now be found in the care plan section) Acute Rehab PT Goals Patient Stated Goal: to figure out what is wrong PT Goal Formulation: With patient/family Time For Goal Achievement: 03/29/20 Potential to Achieve Goals: Fair Progress towards PT goals: Progressing toward goals    Frequency    Min 4X/week      PT Plan Current plan remains appropriate    Co-evaluation PT/OT/SLP Co-Evaluation/Treatment: Yes Reason for Co-Treatment: Complexity of the patient's impairments (multi-system involvement);For patient/therapist safety;To address functional/ADL transfers PT goals addressed during session: Mobility/safety with mobility;Balance        AM-PAC PT "6 Clicks" Mobility  Outcome Measure  Help needed turning from your back to your side while in a flat bed without using bedrails?: A Lot Help needed moving from lying on your back to sitting on the side of a flat bed without using bedrails?: A Lot Help needed moving to and from a bed to a chair (including a wheelchair)?: A Lot Help needed standing up from a chair using your arms (e.g., wheelchair or bedside chair)?: Total Help needed to walk in hospital room?: Total Help needed climbing 3-5 steps with a railing? : Total 6 Click Score: 9     End of Session   Activity Tolerance: Patient limited by fatigue Patient left: with call bell/phone within reach;in bed;with bed alarm set;with family/visitor present Nurse Communication: Mobility status;Need for lift equipment;Other (comment) (need for prevalon boots) PT Visit Diagnosis: Unsteadiness on feet (R26.81);Muscle weakness (generalized) (M62.81);Difficulty in walking, not elsewhere classified (R26.2);Other symptoms and signs involving the nervous system (R29.898);Pain Pain - part of body:  (back and shoulders)     Time: 9407-6808 PT Time Calculation (min) (ACUTE ONLY): 45 min  Charges:  $Therapeutic Activity: 8-22 mins                     James [name], PT, DPT Acute Rehabilitation Services  Pager: [phone] Office: [phone]    James [name] 03/19/2020, 1:43 PM

## 2020-03-19 NOTE — Progress Notes (Signed)
Occupational Therapy Treatment Patient Details Name: James [name]. MRN: [id number] DOB: 14-Dec-1950 Today's Date: 03/19/2020    History of present illness Pt is a 69 year old male who presented with subacute progressive generalized weakness following his recent surgery in November 2021 in which he received a laminectomy and spinal fusion of L3-5 for lumbar spinal stenosis. He reported feeling like he was doing well initial 2 weeks in inpatient rehab following the surgery but then started to decline prior to discharge. He reported R leg weakness > L, similar to presentation prior to surgery. Since surgery pt has also had a UTI and a new sacral decubitus ulcer. CT of head negative for acute intracranial abnormalities. CT of abdomen negative for acute intra-abdominal or intrapelvic pathology and showing intact L3-5 surgical hardware and no acute compression fracture. There is persistent at least moderate neural foraminal stenosis at the L5 level on the R, per CT report. Neurology consulted and pt being worked up for GBS.  PMH: ventricular trachyarrhythmia, typical atrial flutter, s/p ICD, renal oncocytoma of R kidney, obese, SNTEMI, metabolic bone disease, HTN, dyspnea, DM, COPD GOLD II, CKD stage 3, CHF, CAD, and localized prostate CA.   OT comments  Pt seen in conjunction with PT due to multi-system involvement. Pt complaining of increased pain in R hip and arm in session, and remains frustrated with being in the chair for too long therefore deferring mobility into the chair to date. Session focus on neuromuscular reeducation in order to engage trunk and neck to maintain balance sitting EOB (mirror used for visual aide). Pt remains max-total A in order to come to EOB, with positioning of therapist posteriorly and anteriorly to engage core. Pt is demonstrating decreased body awareness in session, often stating he was falling despite being firmly positioned EOB (waxed and waned from max A to min A due to  level of fatigue). Initial manual manipulation needed to elevate head and neck, however throughout multiple repetitions could elevate head to glance up at mirror for three seconds (while also engaging trunk with multi-modal cues at pelvis) before fatiguing. Pts forearms placed on table in hopes to provide external assist to aid in engagement of trunk and neck, however pt reporting significant pain in R with attempts. Pt placed in chair position at end of session, therapy will continue to follow.    Follow Up Recommendations  CIR;Supervision/Assistance - 24 hour    Equipment Recommendations  None recommended by OT    Recommendations for Other Services      Precautions / Restrictions Precautions Precautions: Back;Fall;Other (comment) (sacral wound RN aware) Precaution Booklet Issued: No Precaution Comments: Reviewed spinal precautions. Required Braces or Orthoses: Spinal Brace Spinal Brace: Lumbar corset;Applied in sitting position Restrictions Weight Bearing Restrictions: No       Mobility Bed Mobility Overal bed mobility: Needs Assistance Bed Mobility: Rolling;Sidelying to Sit;Sit to Sidelying Rolling: Max assist Sidelying to sit: Max assist;+2 for physical assistance;HOB elevated     Sit to sidelying: Max assist;+2 for physical assistance;+2 for safety/equipment General bed mobility comments: Cues for foot placement on bed and ipsilateral hand to reach for bed rail to assist with roll, maxA to complete with improved activation and reach with UE rolling to R. MaxAX2 to manage trunk and legs during transitions sidelying <> sit, cuing to utilize R elbow to control trunk and to manage legs.  Transfers                 General transfer comment: Pt declined  to get into chair and unsafe to attempt sit to stand this date.    Balance Overall balance assessment: Needs assistance Sitting-balance support: Bilateral upper extremity supported;Feet supported Sitting balance-Leahy  Scale: Poor Sitting balance - Comments: Mirror anterior to pt throughout sitting balance EOB with feet supported on ground and UEs placed on bed. Pt able to find and maintain midline posture better with visual cues this date. Provided tactile cues and verbal cues at 1 location of the following at a time as he has difficulty activating mutliple at a time: neck extensors to encourage looking up, rhomboids to encourage scapular restraction to look up, and sacrum for anterior pelvic tilt for more upright sitting posture. Intermittent tactile and verbal cues at L lateral trunk to decrease R lateral flexion. Pt able to hold head up for periods of ~5-45 sec at a time and hold anterior pelvic tilt ~6x for ~3 sec duration each. Moments of only min guard assist, esp when bilat UEs were resting on table anterior to him enocuraging trunk flexion rather than posterior lean, but would only last ~5-20 sec before requiring mod/maxA for balance. Postural control: Right lateral lean;Posterior lean     Standing balance comment: Unable                           ADL either performed or assessed with clinical judgement   ADL Overall ADL's : Needs assistance/impaired                                     Functional mobility during ADLs: Maximal assistance;+2 for physical assistance General ADL Comments: session focus on trunk engagement, sitting balance EOB, and activity tolerance and endurance     Vision       Perception     Praxis      Cognition Arousal/Alertness: Awake/alert Behavior During Therapy: WFL for tasks assessed/performed Overall Cognitive Status: Impaired/Different from baseline Area of Impairment: Awareness;Problem solving;Following commands                       Following Commands: Follows one step commands with increased time;Follows one step commands consistently   Awareness: Emergent Problem Solving: Slow processing;Decreased initiation;Requires verbal  cues;Requires tactile cues;Difficulty sequencing General Comments: Required multi-modal single step cues throughout with extra time to process and respond. Pt fearful of falling and unaware of him being safer in position compared to another.        Exercises     Shoulder Instructions       General Comments      Pertinent Vitals/ Pain       Pain Assessment: Faces Faces Pain Scale: Hurts even more Pain Location: back and shoulders Pain Descriptors / Indicators: Discomfort;Grimacing;Shooting Pain Intervention(s): Limited activity within patient's tolerance;Monitored during session;Repositioned  Home Living                                          Prior Functioning/Environment              Frequency  Min 2X/week        Progress Toward Goals  OT Goals(current goals can now be found in the care plan section)  Progress towards OT goals: Progressing toward goals  Acute Rehab OT Goals Patient Stated Goal:  to figure out what is wrong OT Goal Formulation: With patient/family Time For Goal Achievement: 03/29/20 Potential to Achieve Goals: Good  Plan Discharge plan remains appropriate    Co-evaluation    PT/OT/SLP Co-Evaluation/Treatment: Yes Reason for Co-Treatment: Complexity of the patient's impairments (multi-system involvement);For patient/therapist safety;To address functional/ADL transfers PT goals addressed during session: Mobility/safety with mobility;Balance OT goals addressed during session: Proper use of Adaptive equipment and DME;Strengthening/ROM      AM-PAC OT "6 Clicks" Daily Activity     Outcome Measure   Help from another person eating meals?: A Lot Help from another person taking care of personal grooming?: A Lot Help from another person toileting, which includes using toliet, bedpan, or urinal?: Total Help from another person bathing (including washing, rinsing, drying)?: A Lot Help from another person to put on and taking off  regular upper body clothing?: Total Help from another person to put on and taking off regular lower body clothing?: Total 6 Click Score: 9    End of Session    OT Visit Diagnosis: Unsteadiness on feet (R26.81);Muscle weakness (generalized) (M62.81);Pain   Activity Tolerance Patient tolerated treatment well   Patient Left in bed;with call bell/phone within reach;with family/visitor present;with bed alarm set (chair position)   Nurse Communication Mobility status;Other (comment) (wanting to speak to upper management on being in chair too long)        Time: 7092-9574 OT Time Calculation (min): 45 min  Charges: OT General Charges $OT Visit: 1 Visit OT Treatments $Neuromuscular Re-education: 23-37 mins  Ames. Onyinyechi Huante, COTA/L Acute Rehabilitation Services [place] 03/19/2020, 3:48 PM

## 2020-03-19 NOTE — Telephone Encounter (Signed)
New message:    Patient wife calling stating that her husband in hospital. She wanted the doctor to know and would like for him to be her advocate for her. Patient is at [facility].

## 2020-03-20 LAB — CBC
HCT: 36.8 % — ABNORMAL LOW (ref 39.0–52.0)
Hemoglobin: 12.4 g/dL — ABNORMAL LOW (ref 13.0–17.0)
MCH: 29.7 pg (ref 26.0–34.0)
MCHC: 33.7 g/dL (ref 30.0–36.0)
MCV: 88 fL (ref 80.0–100.0)
Platelets: 317 10*3/uL (ref 150–400)
RBC: 4.18 MIL/uL — ABNORMAL LOW (ref 4.22–5.81)
RDW: 14.9 % (ref 11.5–15.5)
WBC: 7.3 10*3/uL (ref 4.0–10.5)
nRBC: 0 % (ref 0.0–0.2)

## 2020-03-20 LAB — GLUCOSE, CAPILLARY
Glucose-Capillary: 148 mg/dL — ABNORMAL HIGH (ref 70–99)
Glucose-Capillary: 161 mg/dL — ABNORMAL HIGH (ref 70–99)
Glucose-Capillary: 154 mg/dL — ABNORMAL HIGH (ref 70–99)
Glucose-Capillary: 138 mg/dL — ABNORMAL HIGH (ref 70–99)
Glucose-Capillary: 150 mg/dL — ABNORMAL HIGH (ref 70–99)

## 2020-03-20 LAB — COMPREHENSIVE METABOLIC PANEL
ALT: 121 U/L — ABNORMAL HIGH (ref 0–44)
AST: 47 U/L — ABNORMAL HIGH (ref 15–41)
Albumin: 2.9 g/dL — ABNORMAL LOW (ref 3.5–5.0)
Alkaline Phosphatase: 137 U/L — ABNORMAL HIGH (ref 38–126)
Anion gap: 9 (ref 5–15)
BUN: 23 mg/dL (ref 8–23)
CO2: 25 mmol/L (ref 22–32)
Calcium: 9.5 mg/dL (ref 8.9–10.3)
Chloride: 106 mmol/L (ref 98–111)
Creatinine, Ser: 1.07 mg/dL (ref 0.61–1.24)
GFR, Estimated: >60 mL/min (ref >60)
Glucose, Bld: 163 mg/dL — ABNORMAL HIGH (ref 70–99)
Potassium: 4.5 mmol/L (ref 3.5–5.1)
Sodium: 140 mmol/L (ref 135–145)
Total Bilirubin: 0.9 mg/dL (ref 0.3–1.2)
Total Protein: 6.3 g/dL — ABNORMAL LOW (ref 6.5–8.1)

## 2020-03-20 LAB — CULTURE, BLOOD (ROUTINE X 2)
Culture: NO GROWTH
Culture: NO GROWTH
Special Requests: ADEQUATE
Special Requests: ADEQUATE

## 2020-03-20 LAB — VITAMIN B6: Vitamin B6: 7.2 ug/L (ref 5.3–46.7)

## 2020-03-20 LAB — VITAMIN B1: Vitamin B1 (Thiamine): 243.3 nmol/L — ABNORMAL HIGH (ref 66.5–200.0)

## 2020-03-20 MED ORDER — AMOXICILLIN 500 MG PO CAPS: 500.0000 mg | ORAL_CAPSULE | Freq: Three times a day (TID) | ORAL | Status: AC

## 2020-03-20 MED ORDER — PROSOURCE PLUS PO LIQD
30.0000 mL | Freq: Two times a day (BID) | ORAL | Status: DC
  Administered 2020-03-20: 15:00:00 30 mL via ORAL
  Filled 2020-03-20 (×3): qty 30

## 2020-03-20 MED ORDER — AMIODARONE HCL 200 MG PO TABS: 200.0000 mg | ORAL_TABLET | Freq: Every day | ORAL | 0 refills | Status: AC

## 2020-03-20 MED ORDER — AMOXICILLIN 500 MG PO CAPS
500.0000 mg | ORAL_CAPSULE | Freq: Three times a day (TID) | ORAL | Status: DC
  Administered 2020-03-20 (×2): 500 mg via ORAL
  Filled 2020-03-20 (×2): qty 1

## 2020-03-20 NOTE — Progress Notes (Signed)
Physical Therapy Treatment Patient Details Name: James [name]. MRN: [id number] DOB: [birth year] Today's Date: 03/20/2020    History of Present Illness Pt is a 69 year old male who presented with subacute progressive generalized weakness following his recent surgery in November 2021 in which he received a laminectomy and spinal fusion of L3-5 for lumbar spinal stenosis. He reported feeling like he was doing well initial 2 weeks in inpatient rehab following the surgery but then started to decline prior to discharge. He reported R leg weakness > L, similar to presentation prior to surgery. Since surgery pt has also had a UTI and a new sacral decubitus ulcer. CT of head negative for acute intracranial abnormalities. CT of abdomen negative for acute intra-abdominal or intrapelvic pathology and showing intact L3-5 surgical hardware and no acute compression fracture. There is persistent at least moderate neural foraminal stenosis at the L5 level on the R, per CT report. Neurology consulted and pt being worked up for GBS.  PMH: ventricular trachyarrhythmia, typical atrial flutter, s/p ICD, renal oncocytoma of R kidney, obese, SNTEMI, metabolic bone disease, HTN, dyspnea, DM, COPD GOLD II, CKD stage 3, CHF, CAD, and localized prostate CA.    PT Comments    Pt making progress towards his goals slowly. Focused session on improving midline sitting balance with use of mirror to provide visual cues throughout. Pt able to attempt to correct posture when cued to look in mirror. Facilitated rhomboids, cervical extensors, and core muscles through tactile and verbal cues to improve upright posture as he tends to sit with trunk flexed, posterior pelvic tilt, shoulders protracted, and head anterior and inferior. Pt displays muscle fatigue quickly, resulting in min-modAx2 provided to maintain sitting balance majority of time but intermittent short bouts of ~3 sec duration of only min guard assist. Pt demonstrated improved  core  muscle facilitation and endurance through being able to hold anterior pelvic tilt for increased period of time of ~3-6 sec at a time each rep. Pt continues to require maxAx1-2 for all bed mob and even decreased UE strength/AROM this date. Will continue to follow acutely. Current recommendations remain appropriate.   Follow Up Recommendations  CIR;Supervision/Assistance - 24 hour (if pt denies CIR then SNF unless family can provide extensive level of assistance necessary)     Equipment Recommendations  Hospital bed;Wheelchair (measurements PT);Wheelchair cushion (measurements PT);Other (comment) (mechanical lift)    Recommendations for Other Services Rehab consult     Precautions / Restrictions Precautions Precautions: Back;Fall;Other (comment) (sacral wound) Precaution Booklet Issued: No Precaution Comments: Reviewed spinal precautions. Required Braces or Orthoses: Spinal Brace Spinal Brace: Lumbar corset;Applied in sitting position Restrictions Weight Bearing Restrictions: No    Mobility  Bed Mobility Overal bed mobility: Needs Assistance Bed Mobility: Rolling;Sidelying to Sit;Sit to Sidelying Rolling: Max assist Sidelying to sit: Max assist;+2 for physical assistance;HOB elevated     Sit to sidelying: Max assist;+2 for physical assistance;+2 for safety/equipment General bed mobility comments: Cues for foot placement on bed and ipsilateral hand to reach for bed rail to assist with roll, maxA to complete with decreased activation and reach with L UE rolling to R. MaxAX2 to manage trunk and legs during transitions sidelying <> sit, cuing to utilize R elbow to control trunk and to manage legs. Tried to relieve pressure on R shoulder this date due to reports of pain from being on it prior session.  Transfers                 General  transfer comment: Pt declined to get into chair and unsafe to attempt sit to stand this date.  Ambulation/Gait             General  Gait Details: Unable   Stairs             Wheelchair Mobility    Modified Rankin (Stroke Patients Only) Modified Rankin (Stroke Patients Only) Pre-Morbid Rankin Score: Moderately severe disability Modified Rankin: Severe disability     Balance Overall balance assessment: Needs assistance Sitting-balance support: Bilateral upper extremity supported;Feet supported Sitting balance-Leahy Scale: Poor Sitting balance - Comments: Mirror anterior to pt throughout sitting balance EOB with feet supported on ground and UEs placed on bed. Pt able to find and maintain midline posture better with visual cues this date. Provided tactile cues and verbal cues at 1-2 locations of the following at a time as he has difficulty activating mutliple at a time: neck extensors to encourage looking up, rhomboids to encourage scapular retraction to look up, and sacrum for anterior pelvic tilt for more upright sitting posture. Intermittent tactile and verbal cues at R elbow/triceps to decrease R lateral flexion. Pt able to hold head up for periods of ~5-45 sec at a time and hold anterior pelvic tilt 2x5 reps for ~3-6 sec duration each. Moments of only min guard assist but would only last ~3 sec before requiring min-modAx2 majority of time for balance with intermittent maxAx1 and minAx1 during bouts of fatigue. Postural control: Right lateral lean;Posterior lean     Standing balance comment: Unable                            Cognition Arousal/Alertness: Awake/alert Behavior During Therapy: WFL for tasks assessed/performed Overall Cognitive Status: Impaired/Different from baseline Area of Impairment: Awareness;Problem solving;Following commands                       Following Commands: Follows one step commands with increased time;Follows one step commands consistently;Follows multi-step commands inconsistently   Awareness: Emergent Problem Solving: Slow processing;Decreased  initiation;Requires verbal cues;Requires tactile cues;Difficulty sequencing General Comments: Required multi-modal single step cues throughout with extra time to process and respond. Pt fearful of falling and unaware of him being safer in position compared to another. Difficulty multi-tasking.      Exercises      General Comments General comments (skin integrity, edema, etc.): Pt positioned at end of session supine in bed with pillows supporting arms, boots donned, and bed in chair-like position      Pertinent Vitals/Pain Pain Assessment: Faces Faces Pain Scale: Hurts even more Pain Location: back and shoulders Pain Descriptors / Indicators: Discomfort;Grimacing Pain Intervention(s): Limited activity within patient's tolerance;Monitored during session;Repositioned    Home Living                      Prior Function            PT Goals (current goals can now be found in the care plan section) Acute Rehab PT Goals Patient Stated Goal: to figure out what is wrong PT Goal Formulation: With patient/family Time For Goal Achievement: 03/29/20 Potential to Achieve Goals: Fair Progress towards PT goals: Progressing toward goals    Frequency    Min 4X/week      PT Plan Current plan remains appropriate    Co-evaluation              AM-PAC PT "6 Clicks"  Mobility   Outcome Measure  Help needed turning from your back to your side while in a flat bed without using bedrails?: A Lot Help needed moving from lying on your back to sitting on the side of a flat bed without using bedrails?: A Lot Help needed moving to and from a bed to a chair (including a wheelchair)?: A Lot Help needed standing up from a chair using your arms (e.g., wheelchair or bedside chair)?: Total Help needed to walk in hospital room?: Total Help needed climbing 3-5 steps with a railing? : Total 6 Click Score: 9    End of Session   Activity Tolerance: Patient limited by fatigue Patient left:  with call bell/phone within reach;in bed;with bed alarm set Nurse Communication: Mobility status PT Visit Diagnosis: Unsteadiness on feet (R26.81);Muscle weakness (generalized) (M62.81);Difficulty in walking, not elsewhere classified (R26.2);Other symptoms and signs involving the nervous system (R29.898);Pain Pain - part of body:  (back and shoulders)     Time: 3662-9476 PT Time Calculation (min) (ACUTE ONLY): 24 min  Charges:  $Neuromuscular Re-education: 23-37 mins                     Moishe Spice, PT, DPT Acute Rehabilitation Services  Pager: [phone] Office: [place] 03/20/2020, 12:44 PM

## 2020-03-20 NOTE — Discharge Summary (Signed)
Physician Discharge Summary  James [name]. OZH:[id number] DOB: 03/31/1951 DOA: 03/08/2020  PCP: [name], MD  Admit date: 03/28/2020 Discharge date: 03/20/2020  Admitted From: Home Disposition: Out of the hospital  Recommendations for Outpatient Follow-up:  1. As per plan of care    Discharge Condition: Fair CODE STATUS: Full code Diet recommendation: Low-salt diet, regular diet to encourage adequate nutrition.  Discharge summary:  This document is prepared as a discharge summary in anticipation of patient discharged to another hospital at off-hours.  This will be of preliminary document, this will be updated every day with new findings as they become available.   69 year old gentleman with history of coronary artery disease status post CABG, history of atrial flutter and V. tach status post ICD, stage III chronic kidney disease, type 2 diabetes on insulin, localized prostate cancer, history of right renal oncocytoma status post right nephrectomy who had laminectomy and spinal fusion of L3-L5 vertebra in November for lumbar spinal stenosis and has been having subacute progressive generalized weakness since surgery.patient has history of peripheral neuropathy. Has h/o chronic pain syndrome, shoulder pain and back pain and on chronic pain management.  02/13/20-03/04/2020 status post back surgery and transferred to acute inpatient rehab.  Chronic back pain and shoulder pain issues as well as right leg weakness from spinal stenosis.  Reported developed lethargy and weakness while at rehab, more sleepiness.  Treated with Bactrim and subsequently with ciprofloxacin for UTI with Serratia on 11/27.  History of self catheterization due to urinary retention about 3 times a day.  Patient unable to get out of the bed.  Right lower extremity weaker than left similar to prior but worse.  Overall very deconditioned.  Also noted weakness of the upper extremity and now cannot use left  arm. Remained in the hospital with persistent and progressive weakness.   Assessment & Plan:   Principal Problem:   Generalized weakness Active Problems:   CKD stage 3 due to type 1 diabetes mellitus (HCC)   S/P ICD (internal cardiac defibrillator) procedure   Systolic CHF, chronic (HCC)   S/p nephrectomy   Transaminitis   Sacral decubitus ulcer, stage II (HCC)   Malnutrition of moderate degree  Functional paraplegia:  Patient started with right lower extremity weakness and now has progressed to functional paraplegia. Seen by neurology, initial concern was GBS/AIDP.  MRI of the cervical spine did not show any acute changes but showed chronic changes. Patient declined to have lumbar puncture x2. His muscular weakness is progressive, cause unknown.  will continue needing investigation. MAG IgM antibodies, rheumatoid factor, Sjogren's syndrome a and B, ANA with reflex, immunofixation electrophoresis, protein electrophoresis, B6, B1 pending. HIV negative. Copper level normal. Zinc level normal. CRP and ESR within normal limits. 24-hour urine UPEP/light chains pending. 24 hour urine collection started 12/17. B12 adequate. TSH is adequate. Followed by neurology. He will need EMG to look for any peripheral cause of neuropathy. Continue to work with PT OT aggressively.  Will need aggressive rehab. Continue working with respiratory therapy, incentive spirometry.  Seen by speech therapy, does not have any trouble swallowing. 12/17, continues to have generalized weakness, proximal muscle weakness more than distal with areflexia. Seen by neurology and they decided against doing lumbar puncture as this will be not beneficial to make any new diagnosis.  Received 1 dose of Plavix on 12/15 and on hold since then. He will need further investigation including inpatient EMG/definitely will benefit with lumbar puncture to rule out GBS or AIDP.  Recent L3-L5 laminectomy and spinal fusion:  Chronic right extremity weakness.  Now overall worsening weakness.  Enough pain medications and mobility.  Followed by neurosurgery.  Chronic kidney disease stage IIIb with history of right nephrectomy: Renal function improved.  Normalized.  Acute urinary retention with history of bladder outlet obstruction: 12/11, catheter placed by urology.  Do not remove.  Will send to his regular urologist for voiding trial after discharge.  Continue Flomax.  Recurrent UTI: Recent Serratia treated with Bactrim and Cipro.  Will avoid using Bactrim and Cipro especially ciprofloxacin given development of encephalopathy.   Repeat culture on 12/16 from catheter sample now growing more than 100,000 Enterococcus faecalis.  Will start patient on amoxicillin for 5 days.  History of coronary artery disease status post CABG: Stable.  No anginal symptoms.  Plavix were on hold since surgery.  Received 1 dose of Plavix 12/15 after spinal tap was declined.  On metoprolol. Holding Plavix as patient may need invasive testing.  History of ischemic cardiomyopathy/chronic systolic congestive heart failure: Known EF 40%.  Volume status stable.  Entresto resumed.  Diuretics on hold.  History of V. tach and a flutter: On amiodarone.  Has ICD in place.  It was turned off for MRI and back on now.  Amiodarone dose reduced with abnormal LFT.  Hypothyroidism: On Synthroid.  Euthyroid.  TSH normal.  Constipation: Laxatives.  Abnormal LFTs: Remains mildly elevated but stable..  Etiology unclear.  Hepatitis panel negative.  Decreased dose of amiodarone.  Continue monitoring.  Statins on hold.  Patient has been accepted to [facility], accepting neurologist Dr. Hall Busing pending bed availability. We will transfer with all imaging studies and laboratory findings.    Discharge Diagnoses:  Principal Problem:   Generalized weakness Active Problems:   CKD stage 3 due to type 1 diabetes mellitus (HCC)   S/P ICD (internal  cardiac defibrillator) procedure   Systolic CHF, chronic (HCC)   S/p nephrectomy   Transaminitis   Sacral decubitus ulcer, stage II (HCC)   Malnutrition of moderate degree    Discharge Instructions  Discharge Instructions    Activity as tolerated - No restrictions   Complete by: As directed    Diet - low sodium heart healthy   Complete by: As directed    No wound care   Complete by: As directed      Allergies as of 03/20/2020      Reactions   Baclofen Other (See Comments)   Mental status changes- made patient go "out of his mind"- PATIENT HAS ONLY 1 KIDNEY!!   Nsaids Other (See Comments)   Can have ONLY Tylenol- due to having only 1 kidney   Codeine Rash      Medication List    STOP taking these medications   atorvastatin 40 MG tablet Commonly known as: LIPITOR   ciprofloxacin 500 MG tablet Commonly known as: CIPRO   clopidogrel 75 MG tablet Commonly known as: PLAVIX   furosemide 40 MG tablet Commonly known as: LASIX     TAKE these medications   acetaminophen 500 MG tablet Commonly known as: TYLENOL Take 500 mg by mouth 2 (two) times daily.   amiodarone 200 MG tablet Commonly known as: PACERONE Take 1 tablet (200 mg total) by mouth daily. What changed: when to take this   amoxicillin 500 MG capsule Commonly known as: AMOXIL Take 1 capsule (500 mg total) by mouth every 8 (eight) hours.   b complex vitamins capsule Take 1 capsule by mouth daily.  CoQ10 100 MG Caps Take 100 mg by mouth in the morning and at bedtime.   cyclobenzaprine 10 MG tablet Commonly known as: FLEXERIL Take 1 tablet (10 mg total) by mouth 3 (three) times daily as needed for muscle spasms.   docusate sodium 100 MG capsule Commonly known as: COLACE Take 1 capsule (100 mg total) by mouth 2 (two) times daily.   Entresto 49-51 MG Generic drug: sacubitril-valsartan Take 1 tablet by mouth 2 (two) times daily.   ergocalciferol 1.25 MG (50000 UT) capsule Commonly known as:  VITAMIN D2 Take 1 capsule (50,000 Units total) by mouth every Monday.   insulin detemir 100 UNIT/ML FlexPen Commonly known as: LEVEMIR Inject 5 Units into the skin daily. What changed: when to take this   isosorbide mononitrate 30 MG 24 hr tablet Commonly known as: IMDUR Take 1 tablet (30 mg total) by mouth every morning. (Hold if blood pressure under 100 sbp)   levothyroxine 88 MCG tablet Commonly known as: SYNTHROID Take 1 tablet (88 mcg total) by mouth daily before breakfast.   lidocaine 5 % Commonly known as: LIDODERM Place 1 patch onto the skin daily. Remove & Discard patch within 12 hours or as directed by MD   LORazepam 0.5 MG tablet Commonly known as: ATIVAN Take 0.5 mg by mouth 2 (two) times daily as needed for anxiety.   melatonin 3 MG Tabs tablet Take 2 tablets (6 mg total) by mouth at bedtime.   metoprolol succinate 25 MG 24 hr tablet Commonly known as: Toprol XL Take 1 tablet (25 mg total) by mouth daily.   multivitamin capsule Take 1 capsule by mouth daily.   nitroGLYCERIN 0.4 MG SL tablet Commonly known as: NITROSTAT Place 1 tablet (0.4 mg total) under the tongue every 5 (five) minutes as needed for chest pain.   NON FORMULARY Apply 1 application topically See admin instructions. Absorbine  Veterinary Horse Liniment Gel- Apply to affected area of sacrum three times a day for pain   Nyamyc powder Generic drug: nystatin Apply 1 application topically daily as needed for irritation.   omega-3 acid ethyl esters 1 g capsule Commonly known as: LOVAZA Take 2 capsules (2 g total) by mouth in the morning and at bedtime. What changed: how much to take   oxyCODONE 5 MG immediate release tablet Commonly known as: Oxy IR/ROXICODONE Take 1-2 tablets (5-10 mg total) by mouth every 4 (four) hours as needed for severe pain.   polyethylene glycol 17 g packet Commonly known as: MIRALAX / GLYCOLAX Take 17 g by mouth 2 (two) times daily.   potassium chloride 10 MEQ  tablet Commonly known as: KLOR-CON Take 1 tablet (10 mEq total) by mouth daily.   senna 8.6 MG tablet Commonly known as: SENOKOT Take 2 tablets by mouth daily as needed for constipation.   sitaGLIPtin 25 MG tablet Commonly known as: JANUVIA Take 25 mg by mouth daily.   tamsulosin 0.4 MG Caps capsule Commonly known as: FLOMAX Take 1 capsule (0.4 mg total) by mouth daily. What changed: when to take this   vitamin C 1000 MG tablet Take 1 tablet (1,000 mg total) by mouth 2 (two) times daily.   Zinc 50 MG Tabs Take 50 mg by mouth daily.       Allergies  Allergen Reactions  . Baclofen Other (See Comments)    Mental status changes- made patient go "out of his mind"- PATIENT HAS ONLY 1 KIDNEY!!  . Nsaids Other (See Comments)    Can have  ONLY Tylenol- due to having only 1 kidney  . Codeine Rash    Consultations:  Neurology  Neurosurgery   Procedures/Studies: CT Head Wo Contrast  Result Date: 03/06/2020 CLINICAL DATA:  Awoke with bilateral leg weakness and numbness. EXAM: CT HEAD WITHOUT CONTRAST TECHNIQUE: Contiguous axial images were obtained from the base of the skull through the vertex without intravenous contrast. COMPARISON:  Head CT 06/25/2019 FINDINGS: Brain: Brain volume is normal for age. No intracranial hemorrhage, mass effect, or midline shift. No hydrocephalus. The basilar cisterns are patent. No evidence of territorial infarct or acute ischemia. No extra-axial or intracranial fluid collection. Vascular: Atherosclerosis of skullbase vasculature without hyperdense vessel or abnormal calcification. Skull: No fracture or focal lesion. Sinuses/Orbits: No acute findings. Bilateral cataract resection. Chronic opacification of right mastoid air cells with slight improvement from prior exam. Chronic opacification of lower left mastoid air cells. Other: None. IMPRESSION: 1. No acute intracranial abnormality. 2. Chronic opacification of right mastoid air cells with slight  improvement from prior exam. Chronic opacification of lower left mastoid air cells. Electronically Signed   By: Keith Rake M.D.   On: 03/08/2020 20:38   MR CERVICAL SPINE WO CONTRAST  Result Date: 03/16/2020 CLINICAL DATA:  Sensory loss in hands and feet with weakness EXAM: MRI CERVICAL SPINE WITHOUT CONTRAST TECHNIQUE: Multiplanar, multisequence MR imaging of the cervical spine was performed. No intravenous contrast was administered. COMPARISON:  None. FINDINGS: Motion artifact is present. Alignment: Trace retrolisthesis at C4-C5 and C5-C6. Vertebrae: Vertebral body heights are maintained. There is no substantial marrow edema. No suspicious osseous lesion. Cord: No abnormal signal. Posterior Fossa, vertebral arteries, paraspinal tissues: Unremarkable. Disc levels: C2-C3: Right uncovertebral hypertrophy and right greater than left facet hypertrophy. No canal stenosis. Marked right foraminal stenosis. Minor left foraminal stenosis. C3-C4: Disc bulge with endplate osteophytes and left greater than right uncovertebral and facet hypertrophy. No canal stenosis. Moderate to marked right and marked left foraminal stenosis. C4-C5: Disc bulge with endplate osteophytes and facet and uncovertebral hypertrophy. Mild to moderate canal stenosis. Marked, left greater than right foraminal stenosis. C5-C6: Disc bulge with endplate osteophytes and uncovertebral and facet hypertrophy. Moderate canal stenosis. Marked, left greater than right foraminal stenosis. C6-C7: Disc bulge with endplate osteophytes and uncovertebral hypertrophy. No canal stenosis. Marked foraminal stenosis. C7-T1: Endplate osteophytes and facet hypertrophy. No canal or foraminal stenosis. IMPRESSION: No abnormal cord signal. Multilevel degenerative changes as detailed above with suboptimal stenosis evaluation due to motion artifact. Canal stenosis is greatest at C5-C6. Multilevel significant foraminal narrowing. Electronically Signed   By: Macy Mis M.D.   On: 03/16/2020 14:19   CT L-SPINE NO CHARGE  Result Date: 03/23/2020 CLINICAL DATA:  Back pain x6 weeks. Cauda carina syndrome. Unable to walk. Recent laminectomy. EXAM: CT ABDOMEN AND PELVIS WITHOUT CONTRAST CT LUMBAR SPINE WITHOUT CONTRAST TECHNIQUE: Multidetector CT imaging of the abdomen and pelvis was performed following the standard protocol without IV contrast. Multiplanar CT images of the lumbar spine were reconstructed from contemporary CT of the Abdomen, and Pelvis COMPARISON:  MRI lumbar spine dated 12/13/2019. CT L-spine dated June 28, 2019. FINDINGS: Lower chest: There is atelectasis at the lung bases.The heart size is normal. Hepatobiliary: The liver is normal. There is gallbladder sludge without CT evidence for acute cholecystitis.There is no biliary ductal dilation. Pancreas: Normal contours without ductal dilatation. No peripancreatic fluid collection. Spleen: Unremarkable. Adrenals/Urinary Tract: --Adrenal glands: Unremarkable. --Right kidney/ureter: Patient is status post prior right-sided nephrectomy. --Left kidney/ureter: No hydronephrosis or radiopaque kidney stones. --  Urinary bladder: Unremarkable. Stomach/Bowel: --Stomach/Duodenum: No hiatal hernia or other gastric abnormality. Normal duodenal course and caliber. --Small bowel: Unremarkable. --Colon: There is a moderate amount of stool in the rectum. --Appendix: Normal. Vascular/Lymphatic: Atherosclerotic calcification is present within the non-aneurysmal abdominal aorta, without hemodynamically significant stenosis. --No retroperitoneal lymphadenopathy. --No mesenteric lymphadenopathy. --No pelvic or inguinal lymphadenopathy. Reproductive: There is significant prostatomegaly. Other: No ascites or free air. There are bilateral fat containing inguinal hernias. Musculoskeletal. The patient is status post prior L3 through L5 posterior fusion with interbody spacers at the L3-L4 and L4-L5 levels. The patient is status post prior  L3-L4 laminectomy. Evaluation of the lower lumbar spine is limited by streak artifact from the adjacent metallic hardware. There is moderate disc height loss at the L5-S1 level. There is probable moderate right-sided osseous neural foraminal narrowing. There is no acute compression fracture. IMPRESSION: 1. No CT evidence for acute intra-abdominal or intrapelvic pathology. 2. Status post prior right-sided nephrectomy. 3. Moderate amount of stool in the rectum. 4. Marked prostatomegaly. 5. Bilateral fat containing inguinal hernias. 6. The patient is status post prior L3 through L5 posterior fusion and decompression with multilevel interbody spacers. The hardware is intact. There is persistent at least moderate neural foraminal stenosis at the L5 level on the right. There is no acute compression fracture. Evaluation of the spinal canal is significantly limited by metallic streak artifact. Aortic Atherosclerosis (ICD10-I70.0). Electronically Signed   By: Constance Holster M.D.   On: 03/31/2020 20:40   DG Chest Port 1 View  Result Date: 03/05/2020 CLINICAL DATA:  Generalized weakness. Leg weakness. EXAM: PORTABLE CHEST 1 VIEW COMPARISON:  08/28/2019 FINDINGS: Left-sided pacemaker in place. Post median sternotomy. Stable mild cardiomegaly. Unchanged mediastinal contours. Aortic atherosclerosis. Minimal subsegmental atelectasis or scarring at the left lung base. No new or progressive airspace disease. No pleural fluid or pneumothorax. No pulmonary edema. No acute osseous abnormalities are seen. IMPRESSION: Stable mild cardiomegaly. No acute abnormality. Aortic Atherosclerosis (ICD10-I70.0). Electronically Signed   By: Keith Rake M.D.   On: 03/29/2020 18:35   CT Renal Stone Study  Result Date: 03/23/2020 CLINICAL DATA:  Back pain x6 weeks. Cauda carina syndrome. Unable to walk. Recent laminectomy. EXAM: CT ABDOMEN AND PELVIS WITHOUT CONTRAST CT LUMBAR SPINE WITHOUT CONTRAST TECHNIQUE: Multidetector CT  imaging of the abdomen and pelvis was performed following the standard protocol without IV contrast. Multiplanar CT images of the lumbar spine were reconstructed from contemporary CT of the Abdomen, and Pelvis COMPARISON:  MRI lumbar spine dated 12/13/2019. CT L-spine dated June 28, 2019. FINDINGS: Lower chest: There is atelectasis at the lung bases.The heart size is normal. Hepatobiliary: The liver is normal. There is gallbladder sludge without CT evidence for acute cholecystitis.There is no biliary ductal dilation. Pancreas: Normal contours without ductal dilatation. No peripancreatic fluid collection. Spleen: Unremarkable. Adrenals/Urinary Tract: --Adrenal glands: Unremarkable. --Right kidney/ureter: Patient is status post prior right-sided nephrectomy. --Left kidney/ureter: No hydronephrosis or radiopaque kidney stones. --Urinary bladder: Unremarkable. Stomach/Bowel: --Stomach/Duodenum: No hiatal hernia or other gastric abnormality. Normal duodenal course and caliber. --Small bowel: Unremarkable. --Colon: There is a moderate amount of stool in the rectum. --Appendix: Normal. Vascular/Lymphatic: Atherosclerotic calcification is present within the non-aneurysmal abdominal aorta, without hemodynamically significant stenosis. --No retroperitoneal lymphadenopathy. --No mesenteric lymphadenopathy. --No pelvic or inguinal lymphadenopathy. Reproductive: There is significant prostatomegaly. Other: No ascites or free air. There are bilateral fat containing inguinal hernias. Musculoskeletal. The patient is status post prior L3 through L5 posterior fusion with interbody spacers at the L3-L4 and L4-L5  levels. The patient is status post prior L3-L4 laminectomy. Evaluation of the lower lumbar spine is limited by streak artifact from the adjacent metallic hardware. There is moderate disc height loss at the L5-S1 level. There is probable moderate right-sided osseous neural foraminal narrowing. There is no acute compression  fracture. IMPRESSION: 1. No CT evidence for acute intra-abdominal or intrapelvic pathology. 2. Status post prior right-sided nephrectomy. 3. Moderate amount of stool in the rectum. 4. Marked prostatomegaly. 5. Bilateral fat containing inguinal hernias. 6. The patient is status post prior L3 through L5 posterior fusion and decompression with multilevel interbody spacers. The hardware is intact. There is persistent at least moderate neural foraminal stenosis at the L5 level on the right. There is no acute compression fracture. Evaluation of the spinal canal is significantly limited by metallic streak artifact. Aortic Atherosclerosis (ICD10-I70.0). Electronically Signed   By: Constance Holster M.D.   On: 03/11/2020 20:40   (Echo, Carotid, EGD, Colonoscopy, ERCP)    Subjective: Patient seen and examined, events noted at this time and date as above.   Discharge Exam: Vitals:   03/20/20 0748 03/20/20 1152  BP: 139/72 (!) 90/50  Pulse: 80 60  Resp: 16 16  Temp: 97.6 F (36.4 C) 97.6 F (36.4 C)  SpO2: 97% 97%   Vitals:   03/19/20 2353 03/20/20 0357 03/20/20 0748 03/20/20 1152  BP: (!) 136/57 (!) 156/77 139/72 (!) 90/50  Pulse: 60 72 80 60  Resp: 18 18 16 16   Temp: 98.1 F (36.7 C) 97.9 F (36.6 C) 97.6 F (36.4 C) 97.6 F (36.4 C)  TempSrc: Oral Oral Oral Oral  SpO2: 100% 95% 97% 97%  Weight:      Height:       As of 12/17 , 4PM  General exam: Appears chronically sick, he looks comfortable and mildly anxious with all medical issues going on. Respiratory system: Clear to auscultation. Respiratory effort normal.  No added sounds. Cardiovascular system: S1 & S2 heard, RRR.  Trace bilateral pedal edema.  Anasarca.  ICD left precordium. Gastrointestinal system: Abdomen is nondistended, soft and nontender. No organomegaly or masses felt. Normal bowel sounds heard. Foley catheter with clear urine. Central nervous system: Alert and oriented x3.  He was slightly impulsive at times. No  cranial nerve deficits. Extremities:  Right upper extremity: 4/5 on most of the distal muscle groups.  2/5 on proximal muscle groups. Left upper extremity: 3/5 on most of the distal muscle group.  2/5 on proximal muscle group. Right lower extremity: Hip 1-2/5 or less, distal 2/5. Left lower extremity: 2/5 proximal.  3/5 distal muscle groups.  4/5 ankle flexion extension. Reflexes variable, absent on proximal tendons, very sluggish on distal tendons.    The results of significant diagnostics from this hospitalization (including imaging, microbiology, ancillary and laboratory) are listed below for reference.     Microbiology: Recent Results (from the past 240 hour(s))  Resp Panel by RT-PCR (Flu A&B, Covid) Nasopharyngeal Swab     Status: None   Collection Time: 04/03/2020  8:37 PM   Specimen: Nasopharyngeal Swab; Nasopharyngeal(NP) swabs in vial transport medium  Result Value Ref Range Status   SARS Coronavirus 2 by RT PCR NEGATIVE NEGATIVE Final    Comment: (NOTE) SARS-CoV-2 target nucleic acids are NOT DETECTED.  The SARS-CoV-2 RNA is generally detectable in upper respiratory specimens during the acute phase of infection. The lowest concentration of SARS-CoV-2 viral copies this assay can detect is 138 copies/mL. A negative result does not preclude SARS-Cov-2 infection  and should not be used as the sole basis for treatment or other patient management decisions. A negative result may occur with  improper specimen collection/handling, submission of specimen other than nasopharyngeal swab, presence of viral mutation(s) within the areas targeted by this assay, and inadequate number of viral copies(<138 copies/mL). A negative result must be combined with clinical observations, patient history, and epidemiological information. The expected result is Negative.  Fact Sheet for Patients:  EntrepreneurPulse.com.au  Fact Sheet for Healthcare Providers:   IncredibleEmployment.be  This test is no t yet approved or cleared by the [place] FDA and  has been authorized for detection and/or diagnosis of SARS-CoV-2 by FDA under an Emergency Use Authorization (EUA). This EUA will remain  in effect (meaning this test can be used) for the duration of the COVID-19 declaration under Section 564(b)(1) of the Act, 21 U.S.C.section 360bbb-3(b)(1), unless the authorization is terminated  or revoked sooner.       Influenza A by PCR NEGATIVE NEGATIVE Final   Influenza B by PCR NEGATIVE NEGATIVE Final    Comment: (NOTE) The Xpert Xpress SARS-CoV-2/FLU/RSV plus assay is intended as an aid in the diagnosis of influenza from Nasopharyngeal swab specimens and should not be used as a sole basis for treatment. Nasal washings and aspirates are unacceptable for Xpert Xpress SARS-CoV-2/FLU/RSV testing.  Fact Sheet for Patients: EntrepreneurPulse.com.au  Fact Sheet for Healthcare Providers: IncredibleEmployment.be  This test is not yet approved or cleared by the [place] FDA and has been authorized for detection and/or diagnosis of SARS-CoV-2 by FDA under an Emergency Use Authorization (EUA). This EUA will remain in effect (meaning this test can be used) for the duration of the COVID-19 declaration under Section 564(b)(1) of the Act, 21 U.S.C. section 360bbb-3(b)(1), unless the authorization is terminated or revoked.  Performed at [facility], [place] [address] [place], [place] [address]   Culture, blood (routine x 2)     Status: None   Collection Time: 03/15/20  2:48 AM   Specimen: BLOOD RIGHT WRIST  Result Value Ref Range Status   Specimen Description BLOOD RIGHT WRIST  Final   Special Requests   Final    BOTTLES DRAWN AEROBIC AND ANAEROBIC Blood Culture adequate volume   Culture   Final    NO GROWTH 5 DAYS Performed at [facility], [place] [address] [place], [place]  [address]    Report Status 03/20/2020 FINAL  Final  Culture, blood (routine x 2)     Status: None   Collection Time: 03/15/20  3:11 AM   Specimen: BLOOD LEFT FOREARM  Result Value Ref Range Status   Specimen Description BLOOD LEFT FOREARM  Final   Special Requests   Final    BOTTLES DRAWN AEROBIC AND ANAEROBIC Blood Culture adequate volume   Culture   Final    NO GROWTH 5 DAYS Performed at [facility], [place] [address] [place], [place] [address]    Report Status 03/20/2020 FINAL  Final  Culture, Urine     Status: Abnormal (Preliminary result)   Collection Time: 03/19/20  3:32 PM   Specimen: Urine, Catheterized  Result Value Ref Range Status   Specimen Description URINE, CATHETERIZED  Final   Special Requests NONE  Final   Culture (A)  Final    >=100,000 COLONIES/mL ENTEROCOCCUS FAECALIS SUSCEPTIBILITIES TO FOLLOW Performed at [facility], [place] [address], [place], [place] [address]    Report Status PENDING  Incomplete  Labs: BNP (last 3 results) No results for input(s): BNP in the last 8760 hours. Basic Metabolic Panel: Recent Labs  Lab 03/16/20 0410 03/17/20 0735 03/18/20 0323 03/19/20 0131 03/20/20 0406  NA 141 140 142 142 140  K 3.4* 4.0 4.3 4.6 4.5  CL 107 107 107 108 106  CO2 23 23 24 25 25   GLUCOSE 129* 138* 148* 137* 163*  BUN 29* 30* 36* 29* 23  CREATININE 1.38* 1.39* 1.44* 1.22 1.07  CALCIUM 9.2 9.3 9.5 9.4 9.5   Liver Function Tests: Recent Labs  Lab 03/16/20 0410 03/17/20 0735 03/18/20 0323 03/19/20 0131 03/20/20 0406  AST 77* 72* 68* 60* 47*  ALT 174* 161* 153* 137* 121*  ALKPHOS 145* 146* 154* 145* 137*  BILITOT 0.8 0.6 0.6 0.9 0.9  PROT 5.9* 6.3* 6.5 6.5 6.3*  ALBUMIN 2.7* 2.9* 3.0* 2.9* 2.9*   No results for input(s): LIPASE, AMYLASE in the last 168 hours. Recent Labs  Lab 03/18/2020 2114  AMMONIA 11   CBC: Recent Labs  Lab 03/16/20 0410 03/17/20 0735 03/18/20 0323 03/19/20 0131 03/20/20 0406  WBC 6.5 6.4 6.5 7.8  7.3  HGB 10.7* 11.0* 11.9* 12.0* 12.4*  HCT 32.1* 33.0* 36.1* 38.0* 36.8*  MCV 87.2 87.3 88.7 89.8 88.0  PLT 329 333 328 333 317   Cardiac Enzymes: Recent Labs  Lab 03/15/20 0303  CKTOTAL 49   BNP: Invalid input(s): POCBNP CBG: Recent Labs  Lab 03/19/20 1554 03/19/20 2125 03/20/20 0609 03/20/20 0750 03/20/20 1150  GLUCAP 150* 159* 148* 161* 154*   D-Dimer No results for input(s): DDIMER in the last 72 hours. Hgb A1c No results for input(s): HGBA1C in the last 72 hours. Lipid Profile No results for input(s): CHOL, HDL, LDLCALC, TRIG, CHOLHDL, LDLDIRECT in the last 72 hours. Thyroid function studies No results for input(s): TSH, T4TOTAL, T3FREE, THYROIDAB in the last 72 hours.  Invalid input(s): FREET3 Anemia work up No results for input(s): VITAMINB12, FOLATE, FERRITIN, TIBC, IRON, RETICCTPCT in the last 72 hours. Urinalysis    Component Value Date/Time   COLORURINE YELLOW 03/18/2020 1609   APPEARANCEUR CLEAR 03/04/2020 1609   LABSPEC 1.008 03/19/2020 1609   PHURINE 5.0 03/16/2020 1609   GLUCOSEU NEGATIVE 03/11/2020 1609   HGBUR NEGATIVE 03/15/2020 1609   BILIRUBINUR NEGATIVE 03/09/2020 1609   KETONESUR NEGATIVE 04/01/2020 1609   PROTEINUR NEGATIVE 03/09/2020 1609   NITRITE NEGATIVE 04/01/2020 1609   LEUKOCYTESUR NEGATIVE 03/30/2020 1609   Sepsis Labs Invalid input(s): PROCALCITONIN,  WBC,  LACTICIDVEN Microbiology Recent Results (from the past 240 hour(s))  Resp Panel by RT-PCR (Flu A&B, Covid) Nasopharyngeal Swab     Status: None   Collection Time: 03/16/2020  8:37 PM   Specimen: Nasopharyngeal Swab; Nasopharyngeal(NP) swabs in vial transport medium  Result Value Ref Range Status   SARS Coronavirus 2 by RT PCR NEGATIVE NEGATIVE Final    Comment: (NOTE) SARS-CoV-2 target nucleic acids are NOT DETECTED.  The SARS-CoV-2 RNA is generally detectable in upper respiratory specimens during the acute phase of infection. The lowest concentration of SARS-CoV-2  viral copies this assay can detect is 138 copies/mL. A negative result does not preclude SARS-Cov-2 infection and should not be used as the sole basis for treatment or other patient management decisions. A negative result may occur with  improper specimen collection/handling, submission of specimen other than nasopharyngeal swab, presence of viral mutation(s) within the areas targeted by this assay, and inadequate number of viral copies(<138 copies/mL). A negative result must be combined with clinical  observations, patient history, and epidemiological information. The expected result is Negative.  Fact Sheet for Patients:  EntrepreneurPulse.com.au  Fact Sheet for Healthcare Providers:  IncredibleEmployment.be  This test is no t yet approved or cleared by the [place] FDA and  has been authorized for detection and/or diagnosis of SARS-CoV-2 by FDA under an Emergency Use Authorization (EUA). This EUA will remain  in effect (meaning this test can be used) for the duration of the COVID-19 declaration under Section 564(b)(1) of the Act, 21 U.S.C.section 360bbb-3(b)(1), unless the authorization is terminated  or revoked sooner.       Influenza A by PCR NEGATIVE NEGATIVE Final   Influenza B by PCR NEGATIVE NEGATIVE Final    Comment: (NOTE) The Xpert Xpress SARS-CoV-2/FLU/RSV plus assay is intended as an aid in the diagnosis of influenza from Nasopharyngeal swab specimens and should not be used as a sole basis for treatment. Nasal washings and aspirates are unacceptable for Xpert Xpress SARS-CoV-2/FLU/RSV testing.  Fact Sheet for Patients: EntrepreneurPulse.com.au  Fact Sheet for Healthcare Providers: IncredibleEmployment.be  This test is not yet approved or cleared by the [place] FDA and has been authorized for detection and/or diagnosis of SARS-CoV-2 by FDA under an Emergency Use Authorization  (EUA). This EUA will remain in effect (meaning this test can be used) for the duration of the COVID-19 declaration under Section 564(b)(1) of the Act, 21 U.S.C. section 360bbb-3(b)(1), unless the authorization is terminated or revoked.  Performed at [facility], [place] [address] [place], [place] [address]   Culture, blood (routine x 2)     Status: None   Collection Time: 03/15/20  2:48 AM   Specimen: BLOOD RIGHT WRIST  Result Value Ref Range Status   Specimen Description BLOOD RIGHT WRIST  Final   Special Requests   Final    BOTTLES DRAWN AEROBIC AND ANAEROBIC Blood Culture adequate volume   Culture   Final    NO GROWTH 5 DAYS Performed at [facility], [place] [address], [place], [place] [address]    Report Status 03/20/2020 FINAL  Final  Culture, blood (routine x 2)     Status: None   Collection Time: 03/15/20  3:11 AM   Specimen: BLOOD LEFT FOREARM  Result Value Ref Range Status   Specimen Description BLOOD LEFT FOREARM  Final   Special Requests   Final    BOTTLES DRAWN AEROBIC AND ANAEROBIC Blood Culture adequate volume   Culture   Final    NO GROWTH 5 DAYS Performed at [facility], [place] [address], [place], [place] [address]    Report Status 03/20/2020 FINAL  Final  Culture, Urine     Status: Abnormal (Preliminary result)   Collection Time: 03/19/20  3:32 PM   Specimen: Urine, Catheterized  Result Value Ref Range Status   Specimen Description URINE, CATHETERIZED  Final   Special Requests NONE  Final   Culture (A)  Final    >=100,000 COLONIES/mL ENTEROCOCCUS FAECALIS SUSCEPTIBILITIES TO FOLLOW Performed at [facility], [place] [address], [place], [place] [address]    Report Status PENDING  Incomplete     Time coordinating discharge: 45 minutes  SIGNED:   Barb Merino, MD  [facility] 03/20/2020, 3:42 PM

## 2020-03-20 NOTE — Progress Notes (Signed)
 PROGRESS NOTE    James [name].  XTG:[id number] DOB: 1950-05-17 DOA: 03/30/2020 PCP: [name], MD    Brief Narrative:  69 year old gentleman with history of coronary artery disease status post CABG, history of atrial flutter and V. 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  02/13/20-03/04/2020 status post back surgery and transferred to acute inpatient rehab.  Chronic back pain and shoulder pain issues as well as right leg weakness from spinal stenosis. Reported developed lethargy and weakness while at rehab, more sleepiness.  Treated with Bactrim and subsequently with ciprofloxacin for UTI with Serratia on 11/27.  History of self catheterization due to urinary retention about 3 times a day.  Patient unable to get out of the bed.  Right lower extremity weaker than left similar to prior but worse.  Overall very deconditioned.  Also noted weakness of the upper extremity and now cannot use left arm. Remains in the hospital with persistent and progressive weakness.   Assessment & Plan:   Principal Problem:   Generalized weakness Active Problems:   CKD stage 3 due to type 1 diabetes mellitus (HCC)   S/P ICD (internal cardiac defibrillator) procedure   Systolic CHF, chronic (HCC)   S/p nephrectomy   Transaminitis   Sacral decubitus ulcer, stage II (HCC)   Malnutrition of moderate degree  Functional paraplegia:  Patient started with right lower extremity weakness and now has progressed to functional paraplegia. Seen by neurology, initial concern was GBS/AIDP.  MRI of the cervical spine did not show any acute changes but showed chronic changes. Patient declined to have lumbar puncture  x2. His muscular weakness is progressive, cause unknown.  We will continue needing investigation. MAG IgM antibodies, rheumatoid factor, Sjogren's syndrome a and B, ANA with reflex, immunofixation electrophoresis, protein electrophoresis, B6, B1 pending. HIV negative. Copper level normal. Zinc level normal. CRP and ESR within normal limits. 24-hour urine UPEP/light chains pending. 24 hour urine collection started today. B12 adequate. TSH is adequate. Followed by neurology. He will need EMG as outpatient to look for any peripheral cause of neuropathy. Continue to work with PT OT aggressively.  Will need aggressive rehab. Continue working with respiratory therapy, incentive spirometry.  Seen by speech therapy, does not have any trouble swallowing. 12/17, continues to have generalized weakness, proximal muscle weakness more than distal with areflexia. Seen by neurology and they decided against doing lumbar puncture as this will be not beneficial to make any new diagnosis.  Received 1 dose of Plavix on 12/15 and on hold since then. Family requested second opinion and more diagnostic testing, will make calls for transfer to neurology service at [facility] and [facility].  Recent L3-L5 laminectomy and spinal fusion: Chronic right extremity weakness.  Now overall worsening weakness.  Enough pain medications and mobility.  Followed by neurosurgery.  Chronic kidney disease stage IIIb with history of right nephrectomy: Renal function improved.  Normalized.  Acute urinary retention with history of bladder outlet obstruction: 12/11, catheter placed by urology.  Do not remove.  Will send to his regular urologist for voiding trial after discharge.  Continue Flomax.  Recent UTI: Discontinue all antibiotics.  Treated.  History of coronary artery disease status post CABG: Stable.  No anginal symptoms.  Plavix were on hold since surgery.  Received 1 dose of Plavix 12/15 after spinal tap was  declined.  On metoprolol.  History of ischemic cardiomyopathy/chronic systolic congestive heart failure: Known EF 40%.  Volume status stable.  Entresto resumed.  Diuretics on hold.  History of V. tach and a flutter: On amiodarone.  Has ICD in place.  It was turned off for MRI and back on now.  Hypothyroidism: On Synthroid.  Euthyroid.  TSH normal.  Constipation: Laxatives.  Abnormal LFTs: Remains mildly elevated but stable..  Etiology unclear.  Hepatitis panel negative.  Decreased dose of amiodarone.  Continue monitoring.  Statins on hold.   DVT prophylaxis: Lovenox subcu   Code Status: Full code Family Communication: Patient's wife at the bedside. Disposition Plan: Status is: Inpatient  Remains inpatient appropriate because:Inpatient level of care appropriate due to severity of illness   Dispo:  Patient From: Home  Planned Disposition: To be determined,  Expected discharge date: 03/23/2020  Medically stable for discharge: No     Consultants:   Neurology  Neurosurgery  Procedures:   None  Antimicrobials:   None   Subjective: Patient seen and examined.  Patient's wife was at the bedside.  Patient himself denies any complaints.  He had some episodes of confusion since yesterday.  He remains quiet and calm.  He tells me he is about the same.  Afebrile.  Discussed in detail with patient's wife at the bedside, she was upset about not doing a lumbar puncture and I told her information that I gathered from neurology and neurosurgery that they do not think it is worth risk-taking.  She kindly asked me to arrange for transfer to other academic medical centers which I will initiate.  Objective: Vitals:   03/19/20 1958 03/19/20 2353 03/20/20 0357 03/20/20 0748  BP: (!) 159/75 (!) 136/57 (!) 156/77 139/72  Pulse: 72 60 72 80  Resp: _0 Temp: 97.7 F (36.5 C) 98.1 F (36.7 C) 97.9 F (36.6 C) 97.6 F (36.4 C)  TempSrc: Oral Oral Oral Oral  SpO2: 98% 100%  95% 97%  Weight:      Height:        Intake/Output Summary (Last 24 hours) at 03/20/2020 1123 Last data filed at 03/20/2020 0630 Gross per 24 hour  Intake 0 ml  Output 1100 ml  Net -1100 ml   Filed Weights   03/10/2020 1823  Weight: 98.9 kg    Examination:  General exam: Appears chronically sick, he looks comfortable and mildly anxious with all medical issues going on. Respiratory system: Clear to auscultation. Respiratory effort normal.  No added sounds. Cardiovascular system: S1 & S2 heard, RRR.  Trace bilateral pedal edema.  Anasarca.  ICD left precordium. Gastrointestinal system: Abdomen is nondistended, soft and nontender. No organomegaly or masses felt. Normal bowel sounds heard. Foley catheter with clear urine. Central nervous system: Alert and oriented x3.  He was slightly impulsive at times. No cranial nerve deficits. Extremities:  Right upper extremity: 4/5 on most of the distal muscle groups.  2/5 on proximal muscle groups. Left upper extremity: 3/5 on most of the distal muscle group.  2/5 on proximal muscle group. Right lower extremity: Hip 1-2/5 or less, distal 2/5. Left lower extremity: 2/5 proximal.  3/5 distal muscle groups.  4/5 ankle flexion extension. Reflexes variable, occasionally present on distal reflexes.    Data Reviewed: I have personally reviewed following labs and imaging studies  CBC: Recent Labs  Lab 03/16/20 0410 03/17/20  4193 03/18/20 0323 03/19/20 0131 03/20/20 0406  WBC 6.5 6.4 6.5 7.8 7.3  HGB 10.7* 11.0* 11.9* 12.0* 12.4*  HCT 32.1* 33.0* 36.1* 38.0* 36.8*  MCV 87.2 87.3 88.7 89.8 88.0  PLT 329 333 328 333 790   Basic Metabolic Panel: Recent Labs  Lab 03/16/20 0410 03/17/20 0735 03/18/20 0323 03/19/20 0131 03/20/20 0406  NA 141 140 142 142 140  K 3.4* 4.0 4.3 4.6 4.5  CL 107 107 107 108 106  CO2 _0 GLUCOSE 129* 138* 148* 137* 163*  BUN 29* 30* 36* 29* 23  CREATININE 1.38* 1.39* 1.44* 1.22 1.07  CALCIUM  9.2 9.3 9.5 9.4 9.5   GFR: Estimated Creatinine Clearance: 75.3 mL/min (by C-G formula based on SCr of 1.07 mg/dL). Liver Function Tests: Recent Labs  Lab 03/16/20 0410 03/17/20 0735 03/18/20 0323 03/19/20 0131 03/20/20 0406  AST 77* 72* 68* 60* 47*  ALT 174* 161* 153* 137* 121*  ALKPHOS 145* 146* 154* 145* 137*  BILITOT 0.8 0.6 0.6 0.9 0.9  PROT 5.9* 6.3* 6.5 6.5 6.3*  ALBUMIN 2.7* 2.9* 3.0* 2.9* 2.9*   No results for input(s): LIPASE, AMYLASE in the last 168 hours. Recent Labs  Lab 03/09/2020 2114  AMMONIA 11   Coagulation Profile: No results for input(s): INR, PROTIME in the last 168 hours. Cardiac Enzymes: Recent Labs  Lab 03/15/20 0303  CKTOTAL 49   BNP (last 3 results) Recent Labs    01/13/20 1403  PROBNP 1,199*   HbA1C: No results for input(s): HGBA1C in the last 72 hours. CBG: Recent Labs  Lab 03/19/20 1245 03/19/20 1554 03/19/20 2125 03/20/20 0609 03/20/20 0750  GLUCAP 156* 150* 159* 148* 161*   Lipid Profile: No results for input(s): CHOL, HDL, LDLCALC, TRIG, CHOLHDL, LDLDIRECT in the last 72 hours. Thyroid Function Tests: No results for input(s): TSH, T4TOTAL, FREET4, T3FREE, THYROIDAB in the last 72 hours. Anemia Panel: No results for input(s): VITAMINB12, FOLATE, FERRITIN, TIBC, IRON, RETICCTPCT in the last 72 hours. Sepsis Labs: No results for input(s): PROCALCITON, LATICACIDVEN in the last 168 hours.  Recent Results (from the past 240 hour(s))  Resp Panel by RT-PCR (Flu A&B, Covid) Nasopharyngeal Swab     Status: None   Collection Time:   8:37 PM   Specimen: Nasopharyngeal Swab; Nasopharyngeal(NP) swabs in vial transport medium  Result Value Ref Range Status   SARS Coronavirus 2 by RT PCR NEGATIVE NEGATIVE Final    Comment: (NOTE) SARS-CoV-2 target nucleic acids are NOT DETECTED.  The SARS-CoV-2 RNA is generally detectable in upper respiratory specimens during the acute phase of infection. The lowest concentration of  SARS-CoV-2 viral copies this assay can detect is 138 copies/mL. A negative result does not preclude SARS-Cov-2 infection and should not be used as the sole basis for treatment or other patient management decisions. A negative result may occur with  improper specimen collection/handling, submission of specimen other than nasopharyngeal swab, presence of viral mutation(s) within the areas targeted by this assay, and inadequate number of viral copies(<138 copies/mL). A negative result must be combined with clinical observations, patient history, and epidemiological information. The expected result is Negative.  Fact Sheet for Patients:  EntrepreneurPulse.com.au  Fact Sheet for Healthcare Providers:  IncredibleEmployment.be  This test is no t yet approved or cleared by the [place] FDA and  has been authorized for detection and/or diagnosis of SARS-CoV-2 by FDA under an Emergency Use Authorization (EUA). This EUA will remain  in effect (meaning this test  can be used) for the duration of the COVID-19 declaration under Section 564(b)(1) of the Act, 21 U.S.C.section 360bbb-3(b)(1), unless the authorization is terminated  or revoked sooner.       Influenza A by PCR NEGATIVE NEGATIVE Final   Influenza B by PCR NEGATIVE NEGATIVE Final    Comment: (NOTE) The Xpert Xpress SARS-CoV-2/FLU/RSV plus assay is intended as an aid in the diagnosis of influenza from Nasopharyngeal swab specimens and should not be used as a sole basis for treatment. Nasal washings and aspirates are unacceptable for Xpert Xpress SARS-CoV-2/FLU/RSV testing.  Fact Sheet for Patients: EntrepreneurPulse.com.au  Fact Sheet for Healthcare Providers: IncredibleEmployment.be  This test is not yet approved or cleared by the [place] FDA and has been authorized for detection and/or diagnosis of SARS-CoV-2 by FDA under an Emergency Use  Authorization (EUA). This EUA will remain in effect (meaning this test can be used) for the duration of the COVID-19 declaration under Section 564(b)(1) of the Act, 21 U.S.C. section 360bbb-3(b)(1), unless the authorization is terminated or revoked.  Performed at [facility], [place] [address], [place], [place] [address]   Culture, blood (routine x 2)     Status: None   Collection Time: 03/15/20  2:48 AM   Specimen: BLOOD RIGHT WRIST  Result Value Ref Range Status   Specimen Description BLOOD RIGHT WRIST  Final   Special Requests   Final    BOTTLES DRAWN AEROBIC AND ANAEROBIC Blood Culture adequate volume   Culture   Final    NO GROWTH 5 DAYS Performed at [facility], [place] [address] [place], [place] [address]    Report Status 03/20/2020 FINAL  Final  Culture, blood (routine x 2)     Status: None   Collection Time: 03/15/20  3:11 AM   Specimen: BLOOD LEFT FOREARM  Result Value Ref Range Status   Specimen Description BLOOD LEFT FOREARM  Final   Special Requests   Final    BOTTLES DRAWN AEROBIC AND ANAEROBIC Blood Culture adequate volume   Culture   Final    NO GROWTH 5 DAYS Performed at [facility], [place] [address], [place], [place] [address]    Report Status 03/20/2020 FINAL  Final         Radiology Studies: No results found.      Scheduled Meds: . amiodarone  200 mg Oral Daily  . vitamin C  1,000 mg Oral BID  . Chlorhexidine Gluconate Cloth  6 each Topical Daily  . docusate sodium  100 mg Oral BID  . feeding supplement  237 mL Oral TID BM  . insulin aspart  0-9 Units Subcutaneous TID WC  . insulin glargine  5 Units Subcutaneous QHS  . levothyroxine  88 mcg Oral QAC breakfast  . lidocaine  1 patch Transdermal Q24H  . lidocaine (PF)  5 mL Intradermal Once  . lidocaine  1 application Urethral Once  . melatonin  6 mg Oral QHS  . metoprolol succinate  12.5 mg Oral Daily  . multivitamin with minerals  1 tablet Oral Daily  . polyethylene glycol   17 g Oral Daily  . sacubitril-valsartan  1 tablet Oral BID  . tamsulosin  0.4 mg Oral QHS  . Vitamin D (Ergocalciferol)  50,000 Units Oral Q Mon   Continuous Infusions:   LOS: 6 days    Time spent: 35 minutes     Barb Merino, MD [facility] Pager [phone]

## 2020-03-20 NOTE — Progress Notes (Signed)
I have made transfer request to [facility] for ongoing diagnosis and care.  Discussed with neurologist Dr. Hall Busing who has accepted patient for transfer however he will be on a waiting list.  May not happen next 24 to 48 hours.  Updated patient's wife.

## 2020-03-20 NOTE — Evaluation (Signed)
Clinical/Bedside Swallow Evaluation Patient Details  Name: James [name]. MRN: [id number] Date of Birth: 1950/05/21  Today's Date: 03/20/2020 Time: SLP Start Time (ACUTE ONLY): 2841 SLP Stop Time (ACUTE ONLY): 0920 SLP Time Calculation (min) (ACUTE ONLY): 15 min  Past Medical History:  Past Medical History:  Diagnosis Date  . Abnormal nuclear stress test 11/03/2017   Added automatically from request for surgery [phone]  . Benign hypertension with chronic kidney disease, stage III ([place]) 04/11/2018  . CAD in native artery 08/27/2014  . Chronic systolic congestive heart failure ([place]) 10/26/2017  . CKD stage 3 due to type 1 diabetes mellitus ([place]) 08/28/2014   Overview:  Cr 1.3 at discharge 08/21/14  . COPD GOLD II 10/17/2014   Followed in Pulmonary clinic/ [facility]/ [place]  - PFTs   10/17/2014 FEV1  2.09 ( 53%) ratio 62 no sign better p B2 and dlco 63% and corrects to 85%  - 10/17/2014 p extensive coaching HFA effectiveness =  90%    > try stiolto    . DM (diabetes mellitus) ([place])   . Dyspnea 09/05/2014   Followed in Pulmonary clinic/ [facility]/ [place] - 09/05/2014  Walked RA x 3 laps @ 185 ft each stopped due to end of study, nl pace, no desat   - pfts 10/17/14 dlco 63% on Amiodarone > may benefit from 6 month f/u but defer to Cards    . Essential hypertension 09/11/2014   Changed acei to ARB  09/05/14 due to pseudowheeze> improved 10/17/14    . Hyperlipidemia   . Hypertension   . Hypertensive heart disease with congestive heart failure ([place]) 08/27/2014  . ICD (implantable cardioverter-defibrillator) in place 08/27/2014  . Metabolic bone disease 02/72/5366  . NSTEMI (non-ST elevated myocardial infarction) ([place]) 02/12/2014  . Obesity 4/40/3474   Complicated by dm/ hbp and low erv on pfts 10/17/14    . On amiodarone therapy 08/27/2014  . Renal oncocytoma of right kidney 01/29/2018  . S/P ICD (internal cardiac defibrillator) procedure 02/15/2014   followed by [facility] EP  . Typical atrial  flutter ([place]) 08/27/2014  . Ventricular tachyarrhythmia ([place]) 08/27/2014  . Vitamin D deficiency 01/17/2018   Past Surgical History:  Past Surgical History:  Procedure Laterality Date  . CORONARY ARTERY BYPASS GRAFT  2008  . EYE SURGERY    . ICD IMPLANT    . MOHS SURGERY     Right Arm  . NEPHRECTOMY Right    HPI:  Pt is a 69 year old male who presented with subacute progressive generalized weakness following his recent surgery in November 2021 in which he received a laminectomy and spinal fusion of L3-5 for lumbar spinal stenosis. He reported feeling like he was doing well initial 2 weeks in inpatient rehab following the surgery but then started to decline prior to discharge. He reported R leg weakness > L, similar to presentation prior to surgery. Since surgery pt has also had a UTI and a new sacral decubitus ulcer. CT of head negative for acute intracranial abnormalities. CT of abdomen negative for acute intra-abdominal or intrapelvic pathology and showing intact L3-5 surgical hardware and no acute compression fracture. There is persistent at least moderate neural foraminal stenosis at the L5 level on the R, per CT report. Neurology consulted and pt being worked up for GBS.  PMH: ventricular trachyarrhythmia, typical atrial flutter, s/p ICD, renal oncocytoma of R kidney, obese, SNTEMI, metabolic bone disease, HTN, dyspnea, DM, COPD GOLD II, CKD stage 3, CHF, CAD, and  localized prostate CA.   Assessment / Plan / Recommendation Clinical Impression  Pt participated in clinical swallow assessment.  Cranial nerve and oral mechanism exam was unremarkable.  Dentition in poor condition.  Pt was quite confused, but pleasant and redirectable.  He followed commands. Accepted bites of yogurt, granola bar and sips of water with adequate oral attention and mastication/oral control.  There were no s/s of aspiration with any tested consistencies.  Consumption of regular solids led to multiple swallows and pt  endorsing that the food was "getting hung up above" his esophagus.  He reports no significant change in swallowing.  Recommend continuing current diet (regular/thin liquids). Pt requires total assist with feeding- offer small sips/bites and give ample time; position as upright as possible.  SLP will follow briefly to ensure safety/toleration and to determine if further instrumental testing would be valuable to r/o esophageal component.  SLP Visit Diagnosis: Dysphagia, unspecified (R13.10)           Diet Recommendation   regular solids, thin liquids  Medication Administration: Whole meds with liquid    Other  Recommendations Oral Care Recommendations: Oral care BID   Follow up Recommendations Other (comment) (tba)      Frequency and Duration min 2x/week  2 weeks       Prognosis        Swallow Study   General HPI: Pt is a 69 year old male who presented with subacute progressive generalized weakness following his recent surgery in November 2021 in which he received a laminectomy and spinal fusion of L3-5 for lumbar spinal stenosis. He reported feeling like he was doing well initial 2 weeks in inpatient rehab following the surgery but then started to decline prior to discharge. He reported R leg weakness > L, similar to presentation prior to surgery. Since surgery pt has also had a UTI and a new sacral decubitus ulcer. CT of head negative for acute intracranial abnormalities. CT of abdomen negative for acute intra-abdominal or intrapelvic pathology and showing intact L3-5 surgical hardware and no acute compression fracture. There is persistent at least moderate neural foraminal stenosis at the L5 level on the R, per CT report. Neurology consulted and pt being worked up for GBS.  PMH: ventricular trachyarrhythmia, typical atrial flutter, s/p ICD, renal oncocytoma of R kidney, obese, SNTEMI, metabolic bone disease, HTN, dyspnea, DM, COPD GOLD II, CKD stage 3, CHF, CAD, and localized prostate  CA. Type of Study: Bedside Swallow Evaluation Previous Swallow Assessment: no Diet Prior to this Study: Regular;Thin liquids Temperature Spikes Noted: No Respiratory Status: Room air History of Recent Intubation: No Behavior/Cognition: Alert;Confused Oral Cavity Assessment: Within Functional Limits Oral Care Completed by SLP: No Oral Cavity - Dentition: Poor condition Self-Feeding Abilities: Total assist Patient Positioning: Upright in bed Baseline Vocal Quality: Normal Volitional Cough: Weak Volitional Swallow: Able to elicit    Oral/Motor/Sensory Function Overall Oral Motor/Sensory Function: Within functional limits   Ice Chips Ice chips: Within functional limits   Thin Liquid Thin Liquid: Within functional limits    Nectar Thick Nectar Thick Liquid: Not tested   Honey Thick Honey Thick Liquid: Not tested   Puree Puree: Within functional limits   Solid     Solid: Impaired Pharyngeal Phase Impairments: Multiple swallows;Throat Clearing - Delayed      Juan Quam Laurice 03/20/2020,9:31 AM Estill Bamberg L. Tivis Ringer, [place] Office number [phone] Pager [phone]

## 2020-03-20 NOTE — Progress Notes (Signed)
° °  Providing Compassionate, Quality Care - Together   Subjective: Patient reports no issues overnight. He continues to have generalized weakness that is worse on the right. He denies numbness and tingling. No family is at the bedside presently.  Objective: Vital signs in last 24 hours: Temp:  [97.6 F (36.4 C)-98.3 F (36.8 C)] 97.6 F (36.4 C) (12/17 0748) Pulse Rate:  [60-80] 80 (12/17 0748) Resp:  [16-18] 16 (12/17 0748) BP: (122-159)/(57-77) 139/72 (12/17 0748) SpO2:  [95 %-100 %] 97 % (12/17 0748)  Intake/Output from previous day: 12/16 0701 - 12/17 0700 In: 150 [P.O.:150] Out: 1100 [Urine:1100] Intake/Output this shift: No intake/output data recorded.  Alert and oriented x 4 PERRLA CN II-XII grossly intact MAE, Generalized weakness    Lab Results: Recent Labs    03/19/20 0131 03/20/20 0406  WBC 7.8 7.3  HGB 12.0* 12.4*  HCT 38.0* 36.8*  PLT 333 317   BMET Recent Labs    03/19/20 0131 03/20/20 0406  NA 142 140  K 4.6 4.5  CL 108 106  CO2 25 25  GLUCOSE 137* 163*  BUN 29* 23  CREATININE 1.22 1.07  CALCIUM 9.4 9.5    Studies/Results: No results found.  Assessment/Plan: Patient continues to have generalized weakness, which is believed to be metabolic in nature.   LOS: 6 days    -Continue to mobilize with therapies -Continue supportive care   Viona Gilmore, DNP, AGNP-C Nurse Practitioner  [facility] [place]. [address] [place], [place], [place] [address] P: [phone]     F: [phone]  03/20/2020, 11:15 AM

## 2020-03-20 NOTE — Progress Notes (Signed)
   03/20/20 1100  Clinical Encounter Type  Visited With Patient and family together  Visit Type Initial  Referral From Nurse  Consult/Referral To Chaplain  Spiritual Encounters  Spiritual Needs Emotional;Prayer;Other (Comment)  Stress Factors  Patient Stress Factors Health changes  Family Stress Factors Health changes;Major life changes;Other (Comment)  Advance Directives (For Healthcare)  Does Patient Have a Medical Advance Directive? Yes  Does patient want to make changes to medical advance directive? Yes (Inpatient - patient requests chaplain consult to change a medical advance directive)  Type of Advance Directive [place];Living will  Patient request AD witnesses and notary. Chaplain provided 2 volunteers and Pallative Care notary. AD was prepared by patient's attorney. Document executed.

## 2020-03-20 NOTE — Progress Notes (Signed)
Nutrition Follow-up  DOCUMENTATION CODES:   Non-severe (moderate) malnutrition in context of acute illness/injury  INTERVENTION:   Recommend liberalizing pt's diet to regular to encourage po intake  36ml Prosource Plus po BID, each supplement provides 100 kcals and 15 grams of protein  Continue Ensure Enlive po TID, each supplement provides 350 kcal and 20 grams of protein  Continue MVI daily  Continue Magic cup TID with meals, each supplement provides 290 kcal and 9 grams of protein   NUTRITION DIAGNOSIS:   Moderate Malnutrition related to acute illness (laminectomy and spinal fusion of L3-L5 for lumbar spinal stenosis) as evidenced by mild muscle depletion,moderate fat depletion,moderate muscle depletion.  ongoing  GOAL:   Patient will meet greater than or equal to 90% of their needs  progressing  MONITOR:   PO intake,Supplement acceptance,Labs,Weight trends  REASON FOR ASSESSMENT:   Consult Assessment of nutrition requirement/status,Poor PO,Wound healing  ASSESSMENT:   Pt with a PMH including CAD s/p CABG, h/o AFL and VT s/p ICD, CKD stage 3, type 2 DM, localized prostate cancer, h/o R renal oncocytoma s/p R nephrectomy who had laminectomy and spinal fusion of L3-L5 in November 2021 for lumbar spinal stenosis and has been having subacute progressive generalized weakness since surgery.  Pt started with RLE weakness that has now progressed to functional paraplegia. Cause of progressive weakness is still unknown. Neurology's initial concern was GBS/AIDP. MRI of the cervical spine did not show any acute changes but did show chronic changes. Pt has declined LP x2 and Neurology has since decided it would not be beneficial in making any new diagnosis.   Noted that pt is to transfer to [facility] for ongoing diagnosis and care per MD. Pt is on a waiting list, though, so this may not occur for 24-48 hours.   Limited meal documentation available. Discussed pt with RN  who reports pt has not been doing very well with meals, but has been drinking supplements well (currently receiving Ensure Enlive/Plus TID and Magic Cup TID). Will continue to provide pt with supplements. Will also recommend pt's diet be liberalized to regular in hopes of encouraging more sufficient po intake.   PO Intake: 0-25% x 5 recorded meals (15% average meal intake)  UOP: 1122ml x24 hours  Labs: LFTs remain elevated but are trending down CBGs [phone] Medications: vitamin c, colace, ss novolog TID with meals, 5 units lantus daily, mvi with minerals, miralax, drisdol  Diet Order:   Diet Order            Diet heart healthy/carb modified Room service appropriate? Yes with Assist; Fluid consistency: Thin  Diet effective now                 EDUCATION NEEDS:   No education needs have been identified at this time  Skin:  Skin Assessment: Skin Integrity Issues: Skin Integrity Issues:: Incisions,Other (Comment) Incisions: back Other: sacral decubitus ulcer  Last BM:  03/19/20  Height:   Ht Readings from Last 1 Encounters:  03/09/2020 5\' 8"  (1.727 m)    Weight:   Wt Readings from Last 1 Encounters:  03/05/2020 98.9 kg   BMI:  Body mass index is 33.15 kg/m.  Estimated Nutritional Needs:   Kcal:  2200-2400  Protein:  115-125 grams  Fluid:  >2.0L/d    Larkin Ina, MS, RD, LDN RD pager number and weekend/on-call pager number located in [place].

## 2020-03-21 LAB — URINE CULTURE: Culture: >=100000 — AB

## 2020-03-21 LAB — GLUCOSE, CAPILLARY
Glucose-Capillary: 181 mg/dL — ABNORMAL HIGH (ref 70–99)
Glucose-Capillary: 268 mg/dL — ABNORMAL HIGH (ref 70–99)

## 2020-03-24 LAB — MISC LABCORP TEST (SEND OUT): Labcorp test code: 140385

## 2020-04-04 NOTE — Code Documentation (Signed)
  Patient Name: James [name].   MRN: [id number]   Date of Birth/ Sex: November 25, 1950 , male      Admission Date: 04/02/2020  Attending Provider: Barb Merino, MD  Primary Diagnosis: Trauma [T14.90XA] Weakness [R53.1] Generalized weakness [R53.1] AKI (acute kidney injury) ([place]) [N17.9]   Indication: Pt was in his usual state of health until this AM, when he was noted to be pulseless. Code blue was subsequently called. At the time of arrival on scene, ACLS protocol was underway.   Technical Description:  - CPR performance duration:  30 minutes  - Was defibrillation or cardioversion used? Yes   - Was external pacer placed? No  - Was patient intubated pre/post CPR? Yes   Medications Administered: Y = Yes; Blank = No Amiodarone  Y  Atropine    Calcium    Epinephrine  Y x7  Lidocaine  Y  Magnesium  Y  Norepinephrine    Phenylephrine    Sodium bicarbonate  Y  Vasopressin    Other    Post CPR evaluation:  - Final Status - Was patient successfully resuscitated ? No   Miscellaneous Information:  - Time of death:  83 AM  - Primary team notified?  Yes  - Family Notified? Yes     Andrew Au, MD   04-06-20, 4:20 AM

## 2020-04-04 NOTE — Death Summary Note (Signed)
Death Summary  James [name]. FUX:[id number] DOB: Aug 09, 1950 DOA: 03/24/2020  PCP: [name], MD  Admit date: 03-24-20 Date of Death: 03/31/20 Time of Death: 03:56  Notification: [name], MD notified of death of March 31, 2020   History of present illness:  James [name]. is a 70 y.o. male with a history of coronary artery disease status post CABG, ischemic cardiomyopathy with most recent EF of 40%, history of atrial flutter and ventricular tachycardia with ICD in place, lumbar spinal stenosis status post L3-L5 laminectomy and spinal fusion in November 2021, history of right renal oncocytoma status post nephrectomy, CKD 3B, hypothyroidism, and recent UTI, who presented to the emergency department on 03-24-20 with progressive generalized weakness.  He was seen by neurosurgery in the emergency department, there were no major acute abnormalities on CT abdomen/pelvis, and neurosurgery noted that his exam was more consistent with a diffuse neuropathic or myopathic condition rather than lumbar radiculopathy or cervical myelopathy.  Patient was admitted to the hospitalist service, seen by urology the following day for acute urinary retention for which Foley catheter was placed.  Neurology also provided consultation the day after initial presentation to the hospital, initially suspected GBS, and recommended further evaluation with MRI and CSF analysis.  Initial lumbar puncture attempts were unsuccessful and the patient ended up refusing further attempts throughout the hospitalization.  Weakness persisted, family requested transfer to a teaching facility, patient was accepted for transfer to [facility] and was placed on wait list there.  Patient's wife was with him in his hospital room the night of 03/20/2020 and into the following morning when he reportedly developed cold sweats developed frequent ectopy on cardiac monitoring, later went into ventricular  arrhythmias with his ICD firing, and nurse entered the room to find him unresponsive.  No pulse was detected and CODE BLUE was activated.  Patient was intubated and high-quality CPR was provided for 30 minutes with administration of amiodarone, lidocaine, magnesium, bicarbonate, and 7 doses of epinephrine.  With these heroic measures unsuccessful, patient's wife agreed that attempts at resuscitation had become futile and the patient was pronounced deceased at 03:56 am.    Final Diagnoses:  1.   Ventricular arrhythmias  2.   Functional paraplegia  3.   CKD IIIb 4.   Recurrent UTI  5.   CAD  6.   Chronic systolic CHF  7.   Lumbar spinal stenosis  8.   Hypothyroidism  9.   Elevated transaminases  10. Bladder outlet obstruction with acute urinary retention    The results of significant diagnostics from this hospitalization (including imaging, microbiology, ancillary and laboratory) are listed below for reference.    Significant Diagnostic Studies: CT Head Wo Contrast  Result Date: March 24, 2020 CLINICAL DATA:  Awoke with bilateral leg weakness and numbness. EXAM: CT HEAD WITHOUT CONTRAST TECHNIQUE: Contiguous axial images were obtained from the base of the skull through the vertex without intravenous contrast. COMPARISON:  Head CT 06/25/2019 FINDINGS: Brain: Brain volume is normal for age. No intracranial hemorrhage, mass effect, or midline shift. No hydrocephalus. The basilar cisterns are patent. No evidence of territorial infarct or acute ischemia. No extra-axial or intracranial fluid collection. Vascular: Atherosclerosis of skullbase vasculature without hyperdense vessel or abnormal calcification. Skull: No fracture or focal lesion. Sinuses/Orbits: No acute findings. Bilateral cataract resection. Chronic opacification of right mastoid air cells with slight improvement from prior exam. Chronic opacification of lower left mastoid air cells. Other: None. IMPRESSION: 1. No  acute intracranial  abnormality. 2. Chronic opacification of right mastoid air cells with slight improvement from prior exam. Chronic opacification of lower left mastoid air cells. Electronically Signed   By: Keith Rake M.D.   On: 03/27/2020 20:38   MR CERVICAL SPINE WO CONTRAST  Result Date: 03/16/2020 CLINICAL DATA:  Sensory loss in hands and feet with weakness EXAM: MRI CERVICAL SPINE WITHOUT CONTRAST TECHNIQUE: Multiplanar, multisequence MR imaging of the cervical spine was performed. No intravenous contrast was administered. COMPARISON:  None. FINDINGS: Motion artifact is present. Alignment: Trace retrolisthesis at C4-C5 and C5-C6. Vertebrae: Vertebral body heights are maintained. There is no substantial marrow edema. No suspicious osseous lesion. Cord: No abnormal signal. Posterior Fossa, vertebral arteries, paraspinal tissues: Unremarkable. Disc levels: C2-C3: Right uncovertebral hypertrophy and right greater than left facet hypertrophy. No canal stenosis. Marked right foraminal stenosis. Minor left foraminal stenosis. C3-C4: Disc bulge with endplate osteophytes and left greater than right uncovertebral and facet hypertrophy. No canal stenosis. Moderate to marked right and marked left foraminal stenosis. C4-C5: Disc bulge with endplate osteophytes and facet and uncovertebral hypertrophy. Mild to moderate canal stenosis. Marked, left greater than right foraminal stenosis. C5-C6: Disc bulge with endplate osteophytes and uncovertebral and facet hypertrophy. Moderate canal stenosis. Marked, left greater than right foraminal stenosis. C6-C7: Disc bulge with endplate osteophytes and uncovertebral hypertrophy. No canal stenosis. Marked foraminal stenosis. C7-T1: Endplate osteophytes and facet hypertrophy. No canal or foraminal stenosis. IMPRESSION: No abnormal cord signal. Multilevel degenerative changes as detailed above with suboptimal stenosis evaluation due to motion artifact. Canal stenosis is greatest at C5-C6.  Multilevel significant foraminal narrowing. Electronically Signed   By: Macy Mis M.D.   On: 03/16/2020 14:19   CT L-SPINE NO CHARGE  Result Date: 04/02/2020 CLINICAL DATA:  Back pain x6 weeks. Cauda carina syndrome. Unable to walk. Recent laminectomy. EXAM: CT ABDOMEN AND PELVIS WITHOUT CONTRAST CT LUMBAR SPINE WITHOUT CONTRAST TECHNIQUE: Multidetector CT imaging of the abdomen and pelvis was performed following the standard protocol without IV contrast. Multiplanar CT images of the lumbar spine were reconstructed from contemporary CT of the Abdomen, and Pelvis COMPARISON:  MRI lumbar spine dated 12/13/2019. CT L-spine dated June 28, 2019. FINDINGS: Lower chest: There is atelectasis at the lung bases.The heart size is normal. Hepatobiliary: The liver is normal. There is gallbladder sludge without CT evidence for acute cholecystitis.There is no biliary ductal dilation. Pancreas: Normal contours without ductal dilatation. No peripancreatic fluid collection. Spleen: Unremarkable. Adrenals/Urinary Tract: --Adrenal glands: Unremarkable. --Right kidney/ureter: Patient is status post prior right-sided nephrectomy. --Left kidney/ureter: No hydronephrosis or radiopaque kidney stones. --Urinary bladder: Unremarkable. Stomach/Bowel: --Stomach/Duodenum: No hiatal hernia or other gastric abnormality. Normal duodenal course and caliber. --Small bowel: Unremarkable. --Colon: There is a moderate amount of stool in the rectum. --Appendix: Normal. Vascular/Lymphatic: Atherosclerotic calcification is present within the non-aneurysmal abdominal aorta, without hemodynamically significant stenosis. --No retroperitoneal lymphadenopathy. --No mesenteric lymphadenopathy. --No pelvic or inguinal lymphadenopathy. Reproductive: There is significant prostatomegaly. Other: No ascites or free air. There are bilateral fat containing inguinal hernias. Musculoskeletal. The patient is status post prior L3 through L5 posterior fusion with  interbody spacers at the L3-L4 and L4-L5 levels. The patient is status post prior L3-L4 laminectomy. Evaluation of the lower lumbar spine is limited by streak artifact from the adjacent metallic hardware. There is moderate disc height loss at the L5-S1 level. There is probable moderate right-sided osseous neural foraminal narrowing. There is no acute compression fracture. IMPRESSION: 1. No CT evidence for acute intra-abdominal or  intrapelvic pathology. 2. Status post prior right-sided nephrectomy. 3. Moderate amount of stool in the rectum. 4. Marked prostatomegaly. 5. Bilateral fat containing inguinal hernias. 6. The patient is status post prior L3 through L5 posterior fusion and decompression with multilevel interbody spacers. The hardware is intact. There is persistent at least moderate neural foraminal stenosis at the L5 level on the right. There is no acute compression fracture. Evaluation of the spinal canal is significantly limited by metallic streak artifact. Aortic Atherosclerosis (ICD10-I70.0). Electronically Signed   By: Constance Holster M.D.   On: 03/24/2020 20:40   DG Chest Port 1 View  Result Date: 03/20/2020 CLINICAL DATA:  Generalized weakness. Leg weakness. EXAM: PORTABLE CHEST 1 VIEW COMPARISON:  08/28/2019 FINDINGS: Left-sided pacemaker in place. Post median sternotomy. Stable mild cardiomegaly. Unchanged mediastinal contours. Aortic atherosclerosis. Minimal subsegmental atelectasis or scarring at the left lung base. No new or progressive airspace disease. No pleural fluid or pneumothorax. No pulmonary edema. No acute osseous abnormalities are seen. IMPRESSION: Stable mild cardiomegaly. No acute abnormality. Aortic Atherosclerosis (ICD10-I70.0). Electronically Signed   By: Keith Rake M.D.   On: 03/15/2020 18:35   CT Renal Stone Study  Result Date: 03/30/2020 CLINICAL DATA:  Back pain x6 weeks. Cauda carina syndrome. Unable to walk. Recent laminectomy. EXAM: CT ABDOMEN AND PELVIS  WITHOUT CONTRAST CT LUMBAR SPINE WITHOUT CONTRAST TECHNIQUE: Multidetector CT imaging of the abdomen and pelvis was performed following the standard protocol without IV contrast. Multiplanar CT images of the lumbar spine were reconstructed from contemporary CT of the Abdomen, and Pelvis COMPARISON:  MRI lumbar spine dated 12/13/2019. CT L-spine dated June 28, 2019. FINDINGS: Lower chest: There is atelectasis at the lung bases.The heart size is normal. Hepatobiliary: The liver is normal. There is gallbladder sludge without CT evidence for acute cholecystitis.There is no biliary ductal dilation. Pancreas: Normal contours without ductal dilatation. No peripancreatic fluid collection. Spleen: Unremarkable. Adrenals/Urinary Tract: --Adrenal glands: Unremarkable. --Right kidney/ureter: Patient is status post prior right-sided nephrectomy. --Left kidney/ureter: No hydronephrosis or radiopaque kidney stones. --Urinary bladder: Unremarkable. Stomach/Bowel: --Stomach/Duodenum: No hiatal hernia or other gastric abnormality. Normal duodenal course and caliber. --Small bowel: Unremarkable. --Colon: There is a moderate amount of stool in the rectum. --Appendix: Normal. Vascular/Lymphatic: Atherosclerotic calcification is present within the non-aneurysmal abdominal aorta, without hemodynamically significant stenosis. --No retroperitoneal lymphadenopathy. --No mesenteric lymphadenopathy. --No pelvic or inguinal lymphadenopathy. Reproductive: There is significant prostatomegaly. Other: No ascites or free air. There are bilateral fat containing inguinal hernias. Musculoskeletal. The patient is status post prior L3 through L5 posterior fusion with interbody spacers at the L3-L4 and L4-L5 levels. The patient is status post prior L3-L4 laminectomy. Evaluation of the lower lumbar spine is limited by streak artifact from the adjacent metallic hardware. There is moderate disc height loss at the L5-S1 level. There is probable moderate  right-sided osseous neural foraminal narrowing. There is no acute compression fracture. IMPRESSION: 1. No CT evidence for acute intra-abdominal or intrapelvic pathology. 2. Status post prior right-sided nephrectomy. 3. Moderate amount of stool in the rectum. 4. Marked prostatomegaly. 5. Bilateral fat containing inguinal hernias. 6. The patient is status post prior L3 through L5 posterior fusion and decompression with multilevel interbody spacers. The hardware is intact. There is persistent at least moderate neural foraminal stenosis at the L5 level on the right. There is no acute compression fracture. Evaluation of the spinal canal is significantly limited by metallic streak artifact. Aortic Atherosclerosis (ICD10-I70.0). Electronically Signed   By: Constance Holster M.D.   On:  04/03/2020 20:40    Microbiology: Recent Results (from the past 240 hour(s))  Resp Panel by RT-PCR (Flu A&B, Covid) Nasopharyngeal Swab     Status: None   Collection Time: 04/01/2020  8:37 PM   Specimen: Nasopharyngeal Swab; Nasopharyngeal(NP) swabs in vial transport medium  Result Value Ref Range Status   SARS Coronavirus 2 by RT PCR NEGATIVE NEGATIVE Final    Comment: (NOTE) SARS-CoV-2 target nucleic acids are NOT DETECTED.  The SARS-CoV-2 RNA is generally detectable in upper respiratory specimens during the acute phase of infection. The lowest concentration of SARS-CoV-2 viral copies this assay can detect is 138 copies/mL. A negative result does not preclude SARS-Cov-2 infection and should not be used as the sole basis for treatment or other patient management decisions. A negative result may occur with  improper specimen collection/handling, submission of specimen other than nasopharyngeal swab, presence of viral mutation(s) within the areas targeted by this assay, and inadequate number of viral copies(<138 copies/mL). A negative result must be combined with clinical observations, patient history, and  epidemiological information. The expected result is Negative.  Fact Sheet for Patients:  EntrepreneurPulse.com.au  Fact Sheet for Healthcare Providers:  IncredibleEmployment.be  This test is no t yet approved or cleared by the [place] FDA and  has been authorized for detection and/or diagnosis of SARS-CoV-2 by FDA under an Emergency Use Authorization (EUA). This EUA will remain  in effect (meaning this test can be used) for the duration of the COVID-19 declaration under Section 564(b)(1) of the Act, 21 U.S.C.section 360bbb-3(b)(1), unless the authorization is terminated  or revoked sooner.       Influenza A by PCR NEGATIVE NEGATIVE Final   Influenza B by PCR NEGATIVE NEGATIVE Final    Comment: (NOTE) The Xpert Xpress SARS-CoV-2/FLU/RSV plus assay is intended as an aid in the diagnosis of influenza from Nasopharyngeal swab specimens and should not be used as a sole basis for treatment. Nasal washings and aspirates are unacceptable for Xpert Xpress SARS-CoV-2/FLU/RSV testing.  Fact Sheet for Patients: EntrepreneurPulse.com.au  Fact Sheet for Healthcare Providers: IncredibleEmployment.be  This test is not yet approved or cleared by the [place] FDA and has been authorized for detection and/or diagnosis of SARS-CoV-2 by FDA under an Emergency Use Authorization (EUA). This EUA will remain in effect (meaning this test can be used) for the duration of the COVID-19 declaration under Section 564(b)(1) of the Act, 21 U.S.C. section 360bbb-3(b)(1), unless the authorization is terminated or revoked.  Performed at [facility], [place] [address], [place], [place] [address]   Culture, blood (routine x 2)     Status: None   Collection Time: 03/15/20  2:48 AM   Specimen: BLOOD RIGHT WRIST  Result Value Ref Range Status   Specimen Description BLOOD RIGHT WRIST  Final   Special Requests   Final     BOTTLES DRAWN AEROBIC AND ANAEROBIC Blood Culture adequate volume   Culture   Final    NO GROWTH 5 DAYS Performed at [facility], [place] [address], [place], [place] [address]    Report Status 03/20/2020 FINAL  Final  Culture, blood (routine x 2)     Status: None   Collection Time: 03/15/20  3:11 AM   Specimen: BLOOD LEFT FOREARM  Result Value Ref Range Status   Specimen Description BLOOD LEFT FOREARM  Final   Special Requests   Final    BOTTLES DRAWN AEROBIC AND ANAEROBIC Blood Culture adequate volume   Culture  Final    NO GROWTH 5 DAYS Performed at [facility], [place] [address], [place], [place] [address]    Report Status 03/20/2020 FINAL  Final  Culture, Urine     Status: Abnormal (Preliminary result)   Collection Time: 03/19/20  3:32 PM   Specimen: Urine, Catheterized  Result Value Ref Range Status   Specimen Description URINE, CATHETERIZED  Final   Special Requests NONE  Final   Culture (A)  Final    >=100,000 COLONIES/mL ENTEROCOCCUS FAECALIS SUSCEPTIBILITIES TO FOLLOW Performed at [facility], [place] [address], [place], [place] [address]    Report Status PENDING  Incomplete     Labs: Basic Metabolic Panel: Recent Labs  Lab 03/16/20 0410 03/17/20 0735 03/18/20 0323 03/19/20 0131 03/20/20 0406  NA 141 140 142 142 140  K 3.4* 4.0 4.3 4.6 4.5  CL 107 107 107 108 106  CO2 23 23 24 25 25   GLUCOSE 129* 138* 148* 137* 163*  BUN 29* 30* 36* 29* 23  CREATININE 1.38* 1.39* 1.44* 1.22 1.07  CALCIUM 9.2 9.3 9.5 9.4 9.5   Liver Function Tests: Recent Labs  Lab 03/16/20 0410 03/17/20 0735 03/18/20 0323 03/19/20 0131 03/20/20 0406  AST 77* 72* 68* 60* 47*  ALT 174* 161* 153* 137* 121*  ALKPHOS 145* 146* 154* 145* 137*  BILITOT 0.8 0.6 0.6 0.9 0.9  PROT 5.9* 6.3* 6.5 6.5 6.3*  ALBUMIN 2.7* 2.9* 3.0* 2.9* 2.9*   No results for input(s): LIPASE, AMYLASE in the last 168 hours. Recent Labs  Lab 03/07/2020 2114  AMMONIA 11   CBC: Recent Labs   Lab 03/16/20 0410 03/17/20 0735 03/18/20 0323 03/19/20 0131 03/20/20 0406  WBC 6.5 6.4 6.5 7.8 7.3  HGB 10.7* 11.0* 11.9* 12.0* 12.4*  HCT 32.1* 33.0* 36.1* 38.0* 36.8*  MCV 87.2 87.3 88.7 89.8 88.0  PLT 329 333 328 333 317   Cardiac Enzymes: Recent Labs  Lab 03/15/20 0303  CKTOTAL 49   D-Dimer No results for input(s): DDIMER in the last 72 hours. BNP: Invalid input(s): POCBNP CBG: Recent Labs  Lab 03/20/20 1150 03/20/20 1550 03/20/20 2259 03/30/20 0033 March 30, 2020 0329  GLUCAP 154* 138* 150* 181* 268*   Anemia work up No results for input(s): VITAMINB12, FOLATE, FERRITIN, TIBC, IRON, RETICCTPCT in the last 72 hours. Urinalysis    Component Value Date/Time   COLORURINE YELLOW 03/12/2020 1609   APPEARANCEUR CLEAR 03/17/2020 1609   LABSPEC 1.008 03/05/2020 1609   PHURINE 5.0 04/02/2020 1609   GLUCOSEU NEGATIVE 03/17/2020 1609   HGBUR NEGATIVE 03/07/2020 1609   BILIRUBINUR NEGATIVE 03/11/2020 1609   KETONESUR NEGATIVE 03/30/2020 1609   PROTEINUR NEGATIVE 03/18/2020 1609   NITRITE NEGATIVE 04/01/2020 1609   LEUKOCYTESUR NEGATIVE 03/20/2020 1609   Sepsis Labs Invalid input(s): PROCALCITONIN,  WBC,  LACTICIDVEN     SIGNED:  Vianne Bulls, MD  [facility] March 30, 2020, 4:21 AM Pager   If 7PM-7AM, please contact night-coverage [email or web address] Password [email or web address]

## 2020-04-04 NOTE — Final Progress Note (Signed)
Family does not want autopsy, name of funeral home of choice is in the chart. If we can not reach the deceased Pt's wife, we can contact his brother Costas Sena at [phone]

## 2020-04-04 DEATH — deceased

## 2020-04-15 ENCOUNTER — Ambulatory Visit: Payer: Medicare Other | Admitting: Cardiology

## 2021-01-18 IMAGING — MR MR CERVICAL SPINE W/O CM
4 of 5 series · 21 of 48 positions shown · non-contrast
Comparison: None.

CLINICAL DATA: Sensory loss in hands and feet with weakness

EXAM:
MRI CERVICAL SPINE WITHOUT CONTRAST
TECHNIQUE: Multiplanar, multisequence MR imaging of the cervical spine was
performed. No intravenous contrast was administered.

[Series 3: T2 · sagittal · 3.0mm · 0.43mm/px · 6 of 14 slices shown (1 of 2)]
[im 1/14]
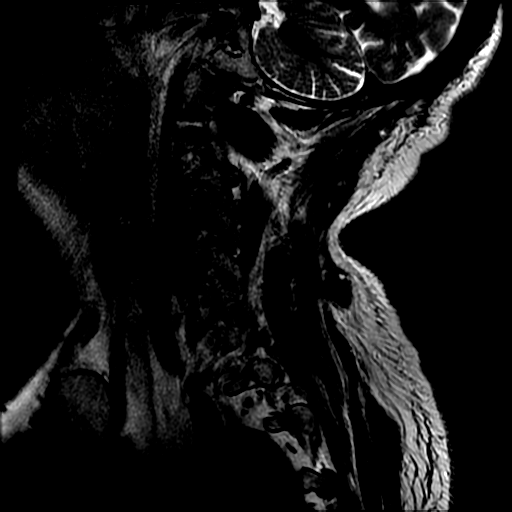
[im 3/14]
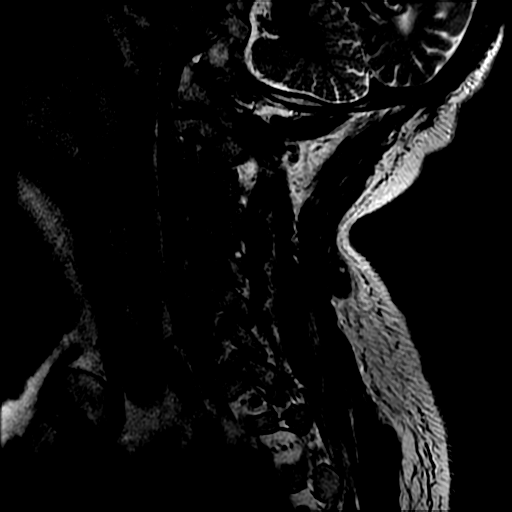
[im 6/14]
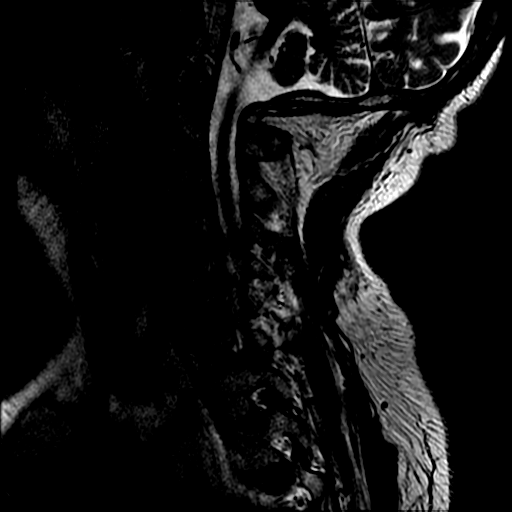
[im 8/14]
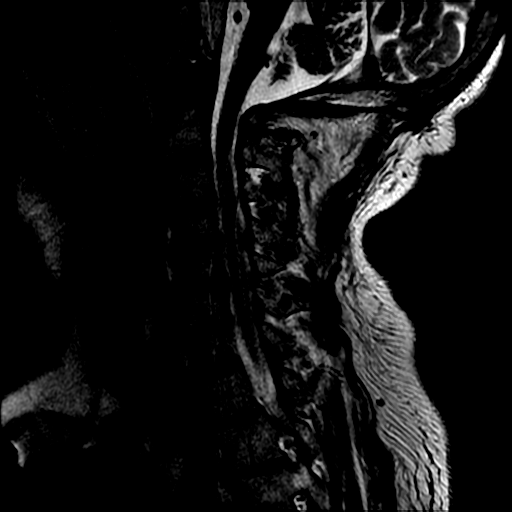
[im 11/14]
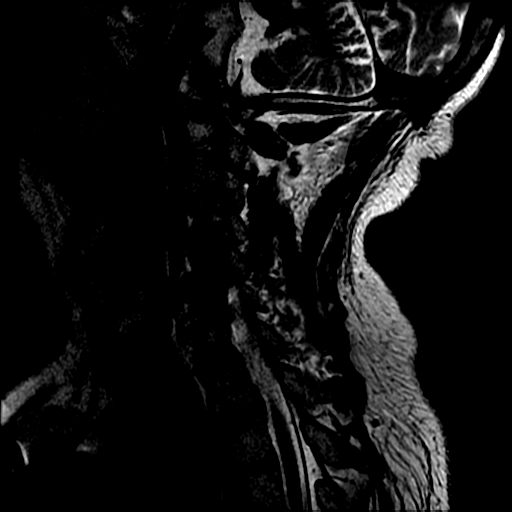
[im 14/14]
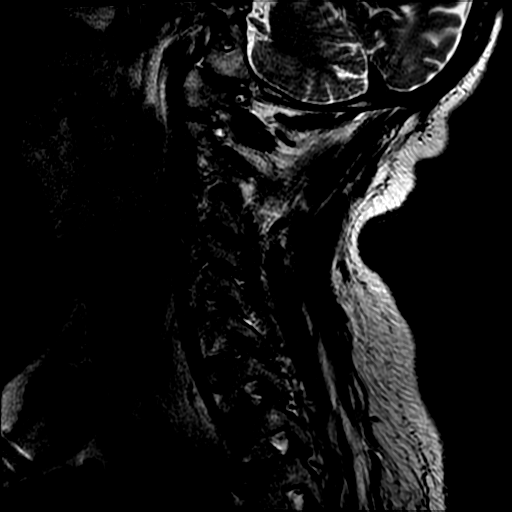

[Series 4: T1 · sagittal · 3.0mm · 0.43mm/px · 4 of 14 slices shown]
[im 1/14]
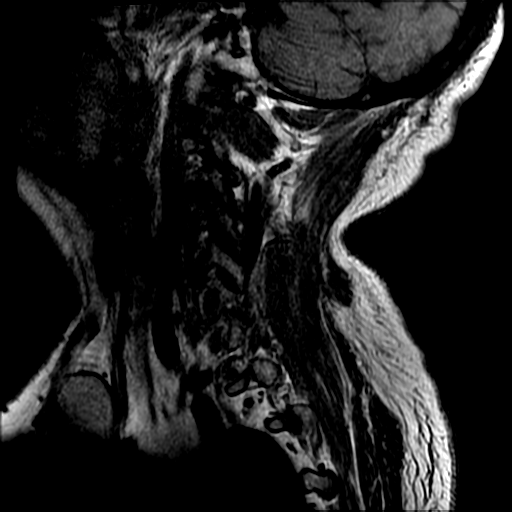
[im 3/14]
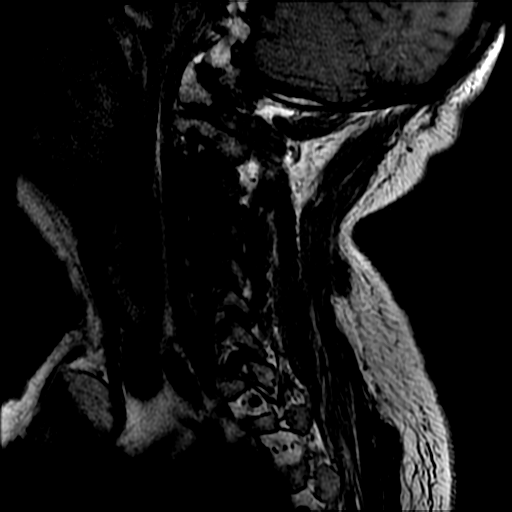
[im 7/14]
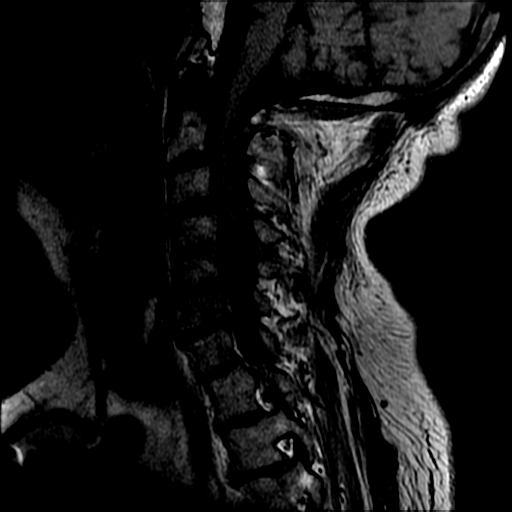
[im 11/14]
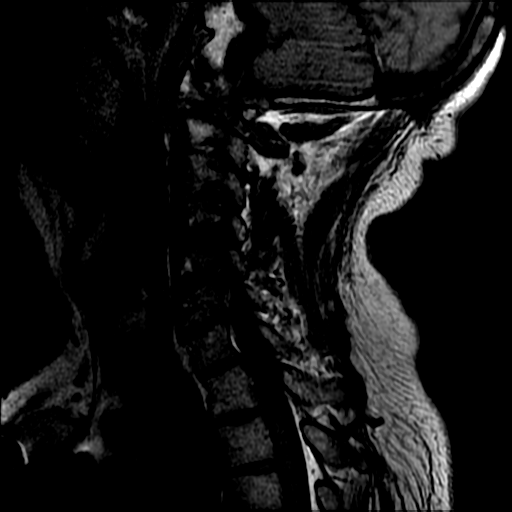

[Series 5: T2 · oblique · 3.0mm · 0.39mm/px · 8 of 30 slices shown (2 of 2)]
[im 1/30]
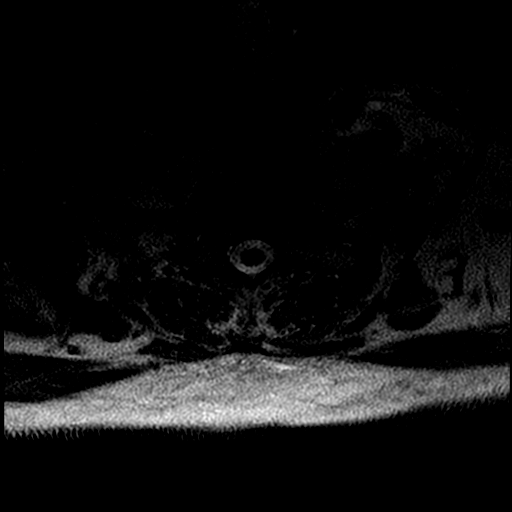
[im 5/30]
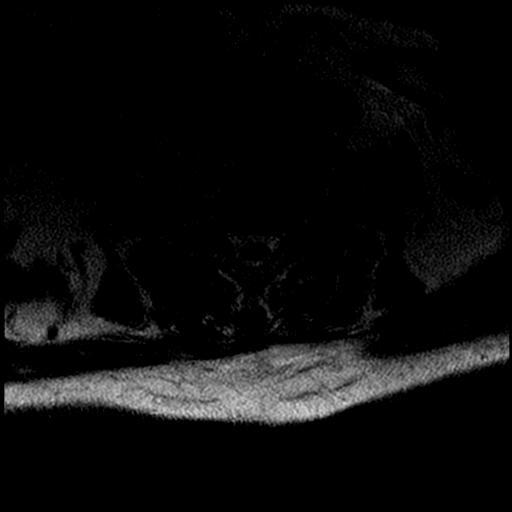
[im 9/30]
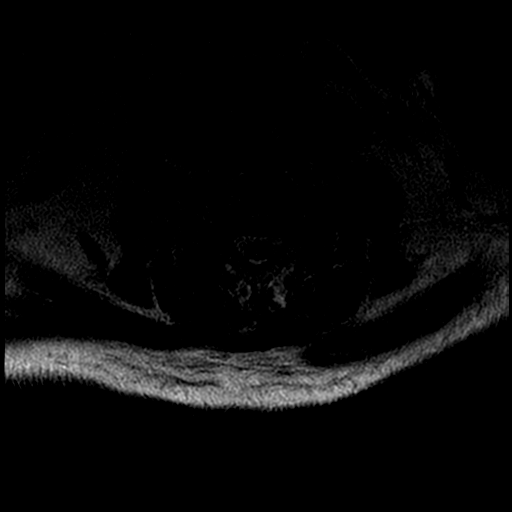
[im 14/30]
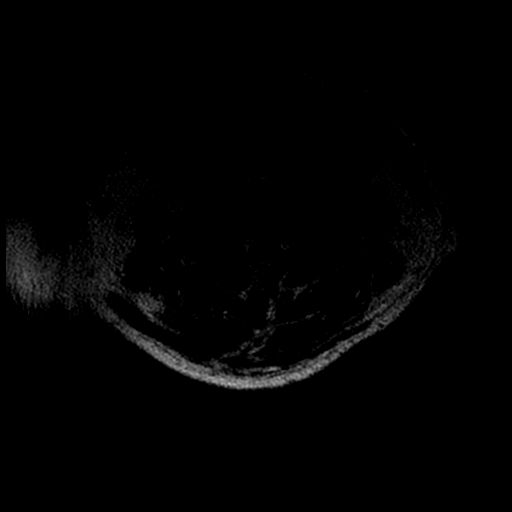
[im 16/30]
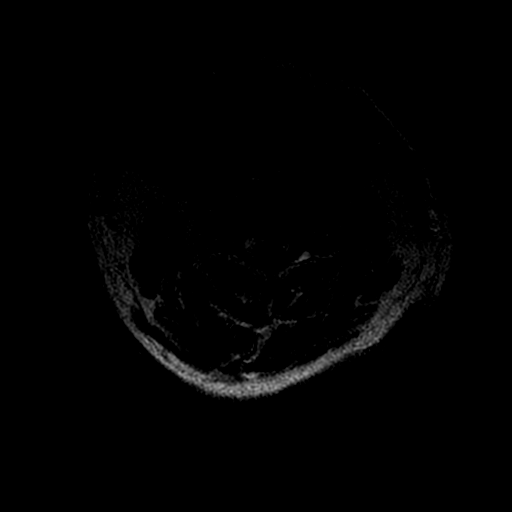
[im 21/30]
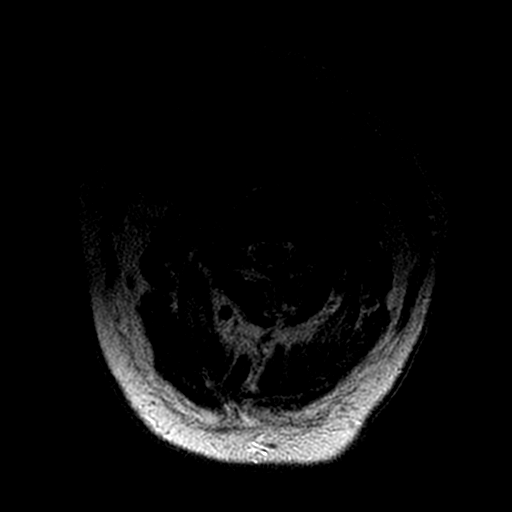
[im 25/30]
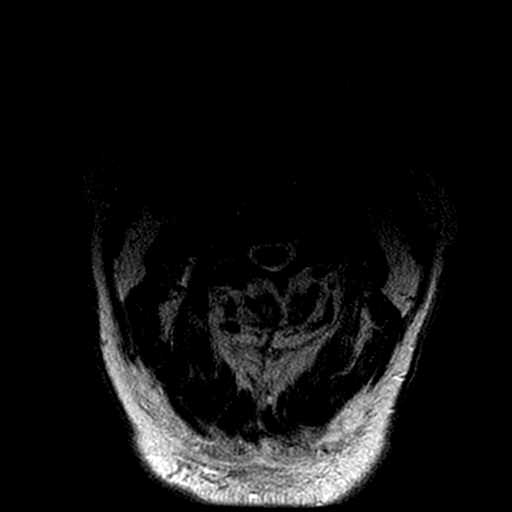
[im 30/30]
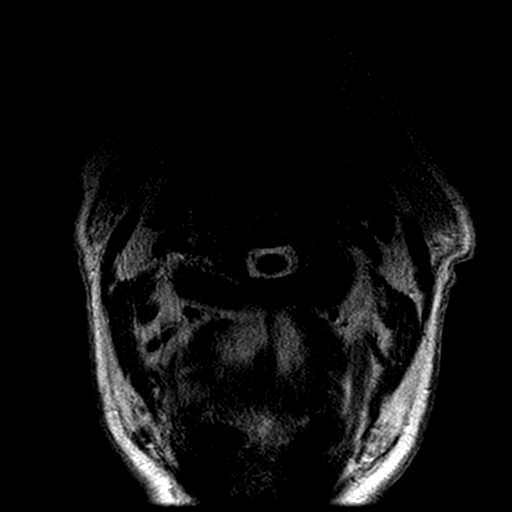

[Series 6: sag ir · sagittal · 3.0mm · 0.86mm/px · 3 of 14 slices shown]
[im 3/14]
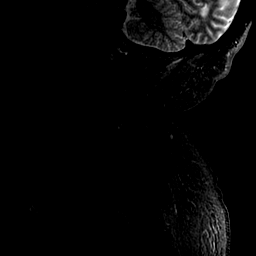
[im 7/14]
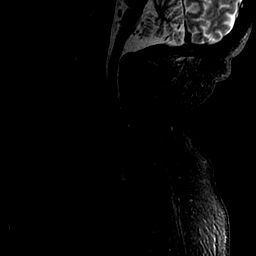
[im 11/14]
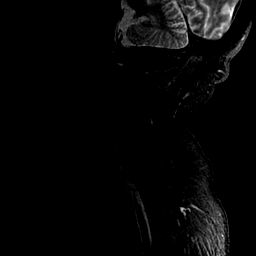

[21 of 48 positions shown; findings below may reference images not displayed]

FINDINGS: Motion artifact is present.

Alignment: Trace retrolisthesis at C4-C5 and C5-C6.

Vertebrae: Vertebral body heights are maintained. There is no
substantial marrow edema. No suspicious osseous lesion.

Cord: No abnormal signal.

Posterior Fossa, vertebral arteries, paraspinal tissues:
Unremarkable.

Disc levels:

C2-C3: Right uncovertebral hypertrophy and right greater than left
facet hypertrophy. No canal stenosis. Marked right foraminal
stenosis. Minor left foraminal stenosis.

C3-C4: Disc bulge with endplate osteophytes and left greater than
right uncovertebral and facet hypertrophy. No canal stenosis.
Moderate to marked right and marked left foraminal stenosis.

C4-C5: Disc bulge with endplate osteophytes and facet and
uncovertebral hypertrophy. Mild to moderate canal stenosis. Marked,
left greater than right foraminal stenosis.

C5-C6: Disc bulge with endplate osteophytes and uncovertebral and
facet hypertrophy. Moderate canal stenosis. Marked, left greater
than right foraminal stenosis.

C6-C7: Disc bulge with endplate osteophytes and uncovertebral
hypertrophy. No canal stenosis. Marked foraminal stenosis.

C7-T1: Endplate osteophytes and facet hypertrophy. No canal or
foraminal stenosis.
IMPRESSION: No abnormal cord signal.

Multilevel degenerative changes as detailed above with suboptimal
stenosis evaluation due to motion artifact. Canal stenosis is
greatest at C5-C6. Multilevel significant foraminal narrowing.
